# Patient Record
Sex: Female | Born: 1950
Health system: Southern US, Community
[De-identification: ages and names within clinical notes are randomized; demographics above are authoritative.]

## PROBLEM LIST (undated history)

## (undated) DIAGNOSIS — F329 Major depressive disorder, single episode, unspecified: Secondary | ICD-10-CM

## (undated) DIAGNOSIS — J449 Chronic obstructive pulmonary disease, unspecified: Secondary | ICD-10-CM

## (undated) DIAGNOSIS — IMO0002 Reserved for concepts with insufficient information to code with codable children: Secondary | ICD-10-CM

## (undated) DIAGNOSIS — F32A Depression, unspecified: Secondary | ICD-10-CM

## (undated) DIAGNOSIS — E079 Disorder of thyroid, unspecified: Secondary | ICD-10-CM

## (undated) DIAGNOSIS — E785 Hyperlipidemia, unspecified: Secondary | ICD-10-CM

## (undated) DIAGNOSIS — G8929 Other chronic pain: Secondary | ICD-10-CM

## (undated) DIAGNOSIS — I10 Essential (primary) hypertension: Secondary | ICD-10-CM

## (undated) DIAGNOSIS — D649 Anemia, unspecified: Secondary | ICD-10-CM

## (undated) DIAGNOSIS — R339 Retention of urine, unspecified: Secondary | ICD-10-CM

## (undated) DIAGNOSIS — I639 Cerebral infarction, unspecified: Secondary | ICD-10-CM

## (undated) DIAGNOSIS — E119 Type 2 diabetes mellitus without complications: Secondary | ICD-10-CM

## (undated) DIAGNOSIS — W19XXXA Unspecified fall, initial encounter: Secondary | ICD-10-CM

## (undated) DIAGNOSIS — I2510 Atherosclerotic heart disease of native coronary artery without angina pectoris: Secondary | ICD-10-CM

## (undated) HISTORY — DX: Hyperlipidemia, unspecified: E78.5

## (undated) HISTORY — PX: CHOLECYSTECTOMY: SHX55

## (undated) HISTORY — DX: Anemia, unspecified: D64.9

## (undated) HISTORY — DX: Other chronic pain: G89.29

## (undated) HISTORY — DX: Retention of urine, unspecified: R33.9

## (undated) HISTORY — DX: Essential (primary) hypertension: I10

## (undated) HISTORY — DX: Depression, unspecified: F32.A

## (undated) HISTORY — DX: Chronic obstructive pulmonary disease, unspecified: J44.9

## (undated) HISTORY — PX: JOINT REPLACEMENT: SHX530

## (undated) HISTORY — DX: Disorder of thyroid, unspecified: E07.9

## (undated) HISTORY — DX: Type 2 diabetes mellitus without complications: E11.9

## (undated) HISTORY — DX: Unspecified fall, initial encounter: W19.XXXA

## (undated) HISTORY — DX: Cerebral infarction, unspecified: I63.9

## (undated) HISTORY — DX: Reserved for concepts with insufficient information to code with codable children: IMO0002

## (undated) HISTORY — PX: ABDOMINAL HYSTERECTOMY: SHX81

## (undated) HISTORY — PX: TONSILLECTOMY: SUR1361

## (undated) HISTORY — DX: Major depressive disorder, single episode, unspecified: F32.9

## (undated) HISTORY — PX: APPENDECTOMY: SHX54

## (undated) HISTORY — DX: Atherosclerotic heart disease of native coronary artery without angina pectoris: I25.10

---

## 2011-08-22 DIAGNOSIS — E119 Type 2 diabetes mellitus without complications: Secondary | ICD-10-CM | POA: Diagnosis not present

## 2011-08-22 DIAGNOSIS — I69959 Hemiplegia and hemiparesis following unspecified cerebrovascular disease affecting unspecified side: Secondary | ICD-10-CM | POA: Diagnosis not present

## 2011-08-22 DIAGNOSIS — J441 Chronic obstructive pulmonary disease with (acute) exacerbation: Secondary | ICD-10-CM | POA: Diagnosis not present

## 2011-08-22 DIAGNOSIS — G589 Mononeuropathy, unspecified: Secondary | ICD-10-CM | POA: Diagnosis not present

## 2011-08-22 DIAGNOSIS — R569 Unspecified convulsions: Secondary | ICD-10-CM | POA: Diagnosis not present

## 2011-08-23 DIAGNOSIS — J441 Chronic obstructive pulmonary disease with (acute) exacerbation: Secondary | ICD-10-CM | POA: Diagnosis not present

## 2011-08-23 DIAGNOSIS — G589 Mononeuropathy, unspecified: Secondary | ICD-10-CM | POA: Diagnosis not present

## 2011-08-23 DIAGNOSIS — I69959 Hemiplegia and hemiparesis following unspecified cerebrovascular disease affecting unspecified side: Secondary | ICD-10-CM | POA: Diagnosis not present

## 2011-08-23 DIAGNOSIS — R569 Unspecified convulsions: Secondary | ICD-10-CM | POA: Diagnosis not present

## 2011-08-23 DIAGNOSIS — E119 Type 2 diabetes mellitus without complications: Secondary | ICD-10-CM | POA: Diagnosis not present

## 2011-08-24 DIAGNOSIS — R569 Unspecified convulsions: Secondary | ICD-10-CM | POA: Diagnosis not present

## 2011-08-24 DIAGNOSIS — J441 Chronic obstructive pulmonary disease with (acute) exacerbation: Secondary | ICD-10-CM | POA: Diagnosis not present

## 2011-08-24 DIAGNOSIS — E119 Type 2 diabetes mellitus without complications: Secondary | ICD-10-CM | POA: Diagnosis not present

## 2011-08-24 DIAGNOSIS — G589 Mononeuropathy, unspecified: Secondary | ICD-10-CM | POA: Diagnosis not present

## 2011-08-24 DIAGNOSIS — I69959 Hemiplegia and hemiparesis following unspecified cerebrovascular disease affecting unspecified side: Secondary | ICD-10-CM | POA: Diagnosis not present

## 2011-08-28 DIAGNOSIS — G589 Mononeuropathy, unspecified: Secondary | ICD-10-CM | POA: Diagnosis not present

## 2011-08-28 DIAGNOSIS — J441 Chronic obstructive pulmonary disease with (acute) exacerbation: Secondary | ICD-10-CM | POA: Diagnosis not present

## 2011-08-28 DIAGNOSIS — E119 Type 2 diabetes mellitus without complications: Secondary | ICD-10-CM | POA: Diagnosis not present

## 2011-08-28 DIAGNOSIS — I69959 Hemiplegia and hemiparesis following unspecified cerebrovascular disease affecting unspecified side: Secondary | ICD-10-CM | POA: Diagnosis not present

## 2011-08-28 DIAGNOSIS — R569 Unspecified convulsions: Secondary | ICD-10-CM | POA: Diagnosis not present

## 2011-08-29 DIAGNOSIS — E119 Type 2 diabetes mellitus without complications: Secondary | ICD-10-CM | POA: Diagnosis not present

## 2011-08-29 DIAGNOSIS — I69959 Hemiplegia and hemiparesis following unspecified cerebrovascular disease affecting unspecified side: Secondary | ICD-10-CM | POA: Diagnosis not present

## 2011-08-29 DIAGNOSIS — J441 Chronic obstructive pulmonary disease with (acute) exacerbation: Secondary | ICD-10-CM | POA: Diagnosis not present

## 2011-08-29 DIAGNOSIS — R569 Unspecified convulsions: Secondary | ICD-10-CM | POA: Diagnosis not present

## 2011-08-29 DIAGNOSIS — G589 Mononeuropathy, unspecified: Secondary | ICD-10-CM | POA: Diagnosis not present

## 2011-08-30 DIAGNOSIS — J441 Chronic obstructive pulmonary disease with (acute) exacerbation: Secondary | ICD-10-CM | POA: Diagnosis not present

## 2011-08-30 DIAGNOSIS — R569 Unspecified convulsions: Secondary | ICD-10-CM | POA: Diagnosis not present

## 2011-08-30 DIAGNOSIS — G589 Mononeuropathy, unspecified: Secondary | ICD-10-CM | POA: Diagnosis not present

## 2011-08-30 DIAGNOSIS — E119 Type 2 diabetes mellitus without complications: Secondary | ICD-10-CM | POA: Diagnosis not present

## 2011-08-30 DIAGNOSIS — I69959 Hemiplegia and hemiparesis following unspecified cerebrovascular disease affecting unspecified side: Secondary | ICD-10-CM | POA: Diagnosis not present

## 2011-09-01 DIAGNOSIS — I69959 Hemiplegia and hemiparesis following unspecified cerebrovascular disease affecting unspecified side: Secondary | ICD-10-CM | POA: Diagnosis not present

## 2011-09-01 DIAGNOSIS — E119 Type 2 diabetes mellitus without complications: Secondary | ICD-10-CM | POA: Diagnosis not present

## 2011-09-01 DIAGNOSIS — G589 Mononeuropathy, unspecified: Secondary | ICD-10-CM | POA: Diagnosis not present

## 2011-09-01 DIAGNOSIS — R569 Unspecified convulsions: Secondary | ICD-10-CM | POA: Diagnosis not present

## 2011-09-01 DIAGNOSIS — J441 Chronic obstructive pulmonary disease with (acute) exacerbation: Secondary | ICD-10-CM | POA: Diagnosis not present

## 2011-09-06 DIAGNOSIS — J441 Chronic obstructive pulmonary disease with (acute) exacerbation: Secondary | ICD-10-CM | POA: Diagnosis not present

## 2011-09-06 DIAGNOSIS — I69959 Hemiplegia and hemiparesis following unspecified cerebrovascular disease affecting unspecified side: Secondary | ICD-10-CM | POA: Diagnosis not present

## 2011-09-06 DIAGNOSIS — R569 Unspecified convulsions: Secondary | ICD-10-CM | POA: Diagnosis not present

## 2011-09-06 DIAGNOSIS — G589 Mononeuropathy, unspecified: Secondary | ICD-10-CM | POA: Diagnosis not present

## 2011-09-06 DIAGNOSIS — E119 Type 2 diabetes mellitus without complications: Secondary | ICD-10-CM | POA: Diagnosis not present

## 2011-09-07 DIAGNOSIS — E119 Type 2 diabetes mellitus without complications: Secondary | ICD-10-CM | POA: Diagnosis not present

## 2011-09-07 DIAGNOSIS — R569 Unspecified convulsions: Secondary | ICD-10-CM | POA: Diagnosis not present

## 2011-09-07 DIAGNOSIS — I69959 Hemiplegia and hemiparesis following unspecified cerebrovascular disease affecting unspecified side: Secondary | ICD-10-CM | POA: Diagnosis not present

## 2011-09-07 DIAGNOSIS — J441 Chronic obstructive pulmonary disease with (acute) exacerbation: Secondary | ICD-10-CM | POA: Diagnosis not present

## 2011-09-07 DIAGNOSIS — G589 Mononeuropathy, unspecified: Secondary | ICD-10-CM | POA: Diagnosis not present

## 2011-09-08 DIAGNOSIS — J441 Chronic obstructive pulmonary disease with (acute) exacerbation: Secondary | ICD-10-CM | POA: Diagnosis not present

## 2011-09-08 DIAGNOSIS — G589 Mononeuropathy, unspecified: Secondary | ICD-10-CM | POA: Diagnosis not present

## 2011-09-08 DIAGNOSIS — R569 Unspecified convulsions: Secondary | ICD-10-CM | POA: Diagnosis not present

## 2011-09-08 DIAGNOSIS — I69959 Hemiplegia and hemiparesis following unspecified cerebrovascular disease affecting unspecified side: Secondary | ICD-10-CM | POA: Diagnosis not present

## 2011-09-08 DIAGNOSIS — E119 Type 2 diabetes mellitus without complications: Secondary | ICD-10-CM | POA: Diagnosis not present

## 2011-09-12 DIAGNOSIS — R569 Unspecified convulsions: Secondary | ICD-10-CM | POA: Diagnosis not present

## 2011-09-12 DIAGNOSIS — G589 Mononeuropathy, unspecified: Secondary | ICD-10-CM | POA: Diagnosis not present

## 2011-09-12 DIAGNOSIS — J441 Chronic obstructive pulmonary disease with (acute) exacerbation: Secondary | ICD-10-CM | POA: Diagnosis not present

## 2011-09-12 DIAGNOSIS — E119 Type 2 diabetes mellitus without complications: Secondary | ICD-10-CM | POA: Diagnosis not present

## 2011-09-12 DIAGNOSIS — I69959 Hemiplegia and hemiparesis following unspecified cerebrovascular disease affecting unspecified side: Secondary | ICD-10-CM | POA: Diagnosis not present

## 2011-09-13 DIAGNOSIS — J441 Chronic obstructive pulmonary disease with (acute) exacerbation: Secondary | ICD-10-CM | POA: Diagnosis not present

## 2011-09-13 DIAGNOSIS — G589 Mononeuropathy, unspecified: Secondary | ICD-10-CM | POA: Diagnosis not present

## 2011-09-13 DIAGNOSIS — I69959 Hemiplegia and hemiparesis following unspecified cerebrovascular disease affecting unspecified side: Secondary | ICD-10-CM | POA: Diagnosis not present

## 2011-09-13 DIAGNOSIS — R569 Unspecified convulsions: Secondary | ICD-10-CM | POA: Diagnosis not present

## 2011-09-13 DIAGNOSIS — E119 Type 2 diabetes mellitus without complications: Secondary | ICD-10-CM | POA: Diagnosis not present

## 2011-09-15 DIAGNOSIS — J441 Chronic obstructive pulmonary disease with (acute) exacerbation: Secondary | ICD-10-CM | POA: Diagnosis not present

## 2011-09-15 DIAGNOSIS — G589 Mononeuropathy, unspecified: Secondary | ICD-10-CM | POA: Diagnosis not present

## 2011-09-15 DIAGNOSIS — R569 Unspecified convulsions: Secondary | ICD-10-CM | POA: Diagnosis not present

## 2011-09-15 DIAGNOSIS — I69959 Hemiplegia and hemiparesis following unspecified cerebrovascular disease affecting unspecified side: Secondary | ICD-10-CM | POA: Diagnosis not present

## 2011-09-15 DIAGNOSIS — E119 Type 2 diabetes mellitus without complications: Secondary | ICD-10-CM | POA: Diagnosis not present

## 2011-09-18 DIAGNOSIS — G589 Mononeuropathy, unspecified: Secondary | ICD-10-CM | POA: Diagnosis not present

## 2011-09-18 DIAGNOSIS — I69959 Hemiplegia and hemiparesis following unspecified cerebrovascular disease affecting unspecified side: Secondary | ICD-10-CM | POA: Diagnosis not present

## 2011-09-18 DIAGNOSIS — E119 Type 2 diabetes mellitus without complications: Secondary | ICD-10-CM | POA: Diagnosis not present

## 2011-09-18 DIAGNOSIS — R569 Unspecified convulsions: Secondary | ICD-10-CM | POA: Diagnosis not present

## 2011-09-18 DIAGNOSIS — J441 Chronic obstructive pulmonary disease with (acute) exacerbation: Secondary | ICD-10-CM | POA: Diagnosis not present

## 2011-09-19 DIAGNOSIS — J441 Chronic obstructive pulmonary disease with (acute) exacerbation: Secondary | ICD-10-CM | POA: Diagnosis not present

## 2011-09-19 DIAGNOSIS — G589 Mononeuropathy, unspecified: Secondary | ICD-10-CM | POA: Diagnosis not present

## 2011-09-19 DIAGNOSIS — E119 Type 2 diabetes mellitus without complications: Secondary | ICD-10-CM | POA: Diagnosis not present

## 2011-09-19 DIAGNOSIS — I69959 Hemiplegia and hemiparesis following unspecified cerebrovascular disease affecting unspecified side: Secondary | ICD-10-CM | POA: Diagnosis not present

## 2011-09-21 DIAGNOSIS — G589 Mononeuropathy, unspecified: Secondary | ICD-10-CM | POA: Diagnosis not present

## 2011-09-21 DIAGNOSIS — J441 Chronic obstructive pulmonary disease with (acute) exacerbation: Secondary | ICD-10-CM | POA: Diagnosis not present

## 2011-09-21 DIAGNOSIS — R569 Unspecified convulsions: Secondary | ICD-10-CM | POA: Diagnosis not present

## 2011-09-21 DIAGNOSIS — E039 Hypothyroidism, unspecified: Secondary | ICD-10-CM | POA: Diagnosis not present

## 2011-09-21 DIAGNOSIS — R300 Dysuria: Secondary | ICD-10-CM | POA: Diagnosis not present

## 2011-09-21 DIAGNOSIS — R0602 Shortness of breath: Secondary | ICD-10-CM | POA: Diagnosis not present

## 2011-09-21 DIAGNOSIS — I69959 Hemiplegia and hemiparesis following unspecified cerebrovascular disease affecting unspecified side: Secondary | ICD-10-CM | POA: Diagnosis not present

## 2011-09-21 DIAGNOSIS — E559 Vitamin D deficiency, unspecified: Secondary | ICD-10-CM | POA: Diagnosis not present

## 2011-09-21 DIAGNOSIS — M109 Gout, unspecified: Secondary | ICD-10-CM | POA: Diagnosis not present

## 2011-09-21 DIAGNOSIS — E119 Type 2 diabetes mellitus without complications: Secondary | ICD-10-CM | POA: Diagnosis not present

## 2011-09-21 DIAGNOSIS — D500 Iron deficiency anemia secondary to blood loss (chronic): Secondary | ICD-10-CM | POA: Diagnosis not present

## 2011-09-21 DIAGNOSIS — M2550 Pain in unspecified joint: Secondary | ICD-10-CM | POA: Diagnosis not present

## 2011-09-21 DIAGNOSIS — E1149 Type 2 diabetes mellitus with other diabetic neurological complication: Secondary | ICD-10-CM | POA: Diagnosis not present

## 2011-10-02 DIAGNOSIS — G589 Mononeuropathy, unspecified: Secondary | ICD-10-CM | POA: Diagnosis not present

## 2011-10-02 DIAGNOSIS — R569 Unspecified convulsions: Secondary | ICD-10-CM | POA: Diagnosis not present

## 2011-10-02 DIAGNOSIS — J441 Chronic obstructive pulmonary disease with (acute) exacerbation: Secondary | ICD-10-CM | POA: Diagnosis not present

## 2011-10-02 DIAGNOSIS — I69959 Hemiplegia and hemiparesis following unspecified cerebrovascular disease affecting unspecified side: Secondary | ICD-10-CM | POA: Diagnosis not present

## 2011-10-02 DIAGNOSIS — E119 Type 2 diabetes mellitus without complications: Secondary | ICD-10-CM | POA: Diagnosis not present

## 2011-10-05 DIAGNOSIS — I69959 Hemiplegia and hemiparesis following unspecified cerebrovascular disease affecting unspecified side: Secondary | ICD-10-CM | POA: Diagnosis not present

## 2011-10-05 DIAGNOSIS — E119 Type 2 diabetes mellitus without complications: Secondary | ICD-10-CM | POA: Diagnosis not present

## 2011-10-05 DIAGNOSIS — R569 Unspecified convulsions: Secondary | ICD-10-CM | POA: Diagnosis not present

## 2011-10-05 DIAGNOSIS — J441 Chronic obstructive pulmonary disease with (acute) exacerbation: Secondary | ICD-10-CM | POA: Diagnosis not present

## 2011-10-05 DIAGNOSIS — G589 Mononeuropathy, unspecified: Secondary | ICD-10-CM | POA: Diagnosis not present

## 2011-10-09 DIAGNOSIS — J441 Chronic obstructive pulmonary disease with (acute) exacerbation: Secondary | ICD-10-CM | POA: Diagnosis not present

## 2011-10-09 DIAGNOSIS — G589 Mononeuropathy, unspecified: Secondary | ICD-10-CM | POA: Diagnosis not present

## 2011-10-09 DIAGNOSIS — R569 Unspecified convulsions: Secondary | ICD-10-CM | POA: Diagnosis not present

## 2011-10-09 DIAGNOSIS — I69959 Hemiplegia and hemiparesis following unspecified cerebrovascular disease affecting unspecified side: Secondary | ICD-10-CM | POA: Diagnosis not present

## 2011-10-09 DIAGNOSIS — E119 Type 2 diabetes mellitus without complications: Secondary | ICD-10-CM | POA: Diagnosis not present

## 2011-10-17 DIAGNOSIS — J441 Chronic obstructive pulmonary disease with (acute) exacerbation: Secondary | ICD-10-CM | POA: Diagnosis not present

## 2011-10-17 DIAGNOSIS — R569 Unspecified convulsions: Secondary | ICD-10-CM | POA: Diagnosis not present

## 2011-10-17 DIAGNOSIS — E119 Type 2 diabetes mellitus without complications: Secondary | ICD-10-CM | POA: Diagnosis not present

## 2011-10-17 DIAGNOSIS — I69959 Hemiplegia and hemiparesis following unspecified cerebrovascular disease affecting unspecified side: Secondary | ICD-10-CM | POA: Diagnosis not present

## 2011-10-17 DIAGNOSIS — G589 Mononeuropathy, unspecified: Secondary | ICD-10-CM | POA: Diagnosis not present

## 2011-10-19 DIAGNOSIS — R05 Cough: Secondary | ICD-10-CM | POA: Diagnosis not present

## 2011-10-21 DIAGNOSIS — J441 Chronic obstructive pulmonary disease with (acute) exacerbation: Secondary | ICD-10-CM | POA: Diagnosis not present

## 2011-10-21 DIAGNOSIS — G589 Mononeuropathy, unspecified: Secondary | ICD-10-CM | POA: Diagnosis not present

## 2011-10-21 DIAGNOSIS — E119 Type 2 diabetes mellitus without complications: Secondary | ICD-10-CM | POA: Diagnosis not present

## 2011-10-21 DIAGNOSIS — R569 Unspecified convulsions: Secondary | ICD-10-CM | POA: Diagnosis not present

## 2011-10-21 DIAGNOSIS — I69959 Hemiplegia and hemiparesis following unspecified cerebrovascular disease affecting unspecified side: Secondary | ICD-10-CM | POA: Diagnosis not present

## 2011-10-25 DIAGNOSIS — G589 Mononeuropathy, unspecified: Secondary | ICD-10-CM | POA: Diagnosis not present

## 2011-10-25 DIAGNOSIS — I69959 Hemiplegia and hemiparesis following unspecified cerebrovascular disease affecting unspecified side: Secondary | ICD-10-CM | POA: Diagnosis not present

## 2011-10-25 DIAGNOSIS — R569 Unspecified convulsions: Secondary | ICD-10-CM | POA: Diagnosis not present

## 2011-10-25 DIAGNOSIS — E119 Type 2 diabetes mellitus without complications: Secondary | ICD-10-CM | POA: Diagnosis not present

## 2011-10-25 DIAGNOSIS — J441 Chronic obstructive pulmonary disease with (acute) exacerbation: Secondary | ICD-10-CM | POA: Diagnosis not present

## 2011-10-31 DIAGNOSIS — I69959 Hemiplegia and hemiparesis following unspecified cerebrovascular disease affecting unspecified side: Secondary | ICD-10-CM | POA: Diagnosis not present

## 2011-10-31 DIAGNOSIS — J441 Chronic obstructive pulmonary disease with (acute) exacerbation: Secondary | ICD-10-CM | POA: Diagnosis not present

## 2011-10-31 DIAGNOSIS — G589 Mononeuropathy, unspecified: Secondary | ICD-10-CM | POA: Diagnosis not present

## 2011-10-31 DIAGNOSIS — E119 Type 2 diabetes mellitus without complications: Secondary | ICD-10-CM | POA: Diagnosis not present

## 2011-10-31 DIAGNOSIS — R569 Unspecified convulsions: Secondary | ICD-10-CM | POA: Diagnosis not present

## 2011-11-08 DIAGNOSIS — R569 Unspecified convulsions: Secondary | ICD-10-CM | POA: Diagnosis not present

## 2011-11-08 DIAGNOSIS — E119 Type 2 diabetes mellitus without complications: Secondary | ICD-10-CM | POA: Diagnosis not present

## 2011-11-08 DIAGNOSIS — I69959 Hemiplegia and hemiparesis following unspecified cerebrovascular disease affecting unspecified side: Secondary | ICD-10-CM | POA: Diagnosis not present

## 2011-11-08 DIAGNOSIS — G589 Mononeuropathy, unspecified: Secondary | ICD-10-CM | POA: Diagnosis not present

## 2011-11-08 DIAGNOSIS — J441 Chronic obstructive pulmonary disease with (acute) exacerbation: Secondary | ICD-10-CM | POA: Diagnosis not present

## 2011-11-15 DIAGNOSIS — G589 Mononeuropathy, unspecified: Secondary | ICD-10-CM | POA: Diagnosis not present

## 2011-11-15 DIAGNOSIS — J441 Chronic obstructive pulmonary disease with (acute) exacerbation: Secondary | ICD-10-CM | POA: Diagnosis not present

## 2011-11-15 DIAGNOSIS — I69959 Hemiplegia and hemiparesis following unspecified cerebrovascular disease affecting unspecified side: Secondary | ICD-10-CM | POA: Diagnosis not present

## 2011-11-15 DIAGNOSIS — E119 Type 2 diabetes mellitus without complications: Secondary | ICD-10-CM | POA: Diagnosis not present

## 2011-11-15 DIAGNOSIS — R569 Unspecified convulsions: Secondary | ICD-10-CM | POA: Diagnosis not present

## 2011-11-22 DIAGNOSIS — I69959 Hemiplegia and hemiparesis following unspecified cerebrovascular disease affecting unspecified side: Secondary | ICD-10-CM | POA: Diagnosis not present

## 2011-11-22 DIAGNOSIS — E119 Type 2 diabetes mellitus without complications: Secondary | ICD-10-CM | POA: Diagnosis not present

## 2011-11-22 DIAGNOSIS — J441 Chronic obstructive pulmonary disease with (acute) exacerbation: Secondary | ICD-10-CM | POA: Diagnosis not present

## 2011-11-22 DIAGNOSIS — G589 Mononeuropathy, unspecified: Secondary | ICD-10-CM | POA: Diagnosis not present

## 2011-11-22 DIAGNOSIS — R569 Unspecified convulsions: Secondary | ICD-10-CM | POA: Diagnosis not present

## 2011-11-27 DIAGNOSIS — G43909 Migraine, unspecified, not intractable, without status migrainosus: Secondary | ICD-10-CM | POA: Diagnosis not present

## 2011-11-27 DIAGNOSIS — J449 Chronic obstructive pulmonary disease, unspecified: Secondary | ICD-10-CM | POA: Diagnosis not present

## 2011-11-27 DIAGNOSIS — J40 Bronchitis, not specified as acute or chronic: Secondary | ICD-10-CM | POA: Diagnosis not present

## 2011-11-27 DIAGNOSIS — M25519 Pain in unspecified shoulder: Secondary | ICD-10-CM | POA: Diagnosis not present

## 2011-11-27 DIAGNOSIS — E1149 Type 2 diabetes mellitus with other diabetic neurological complication: Secondary | ICD-10-CM | POA: Diagnosis not present

## 2011-11-27 DIAGNOSIS — M549 Dorsalgia, unspecified: Secondary | ICD-10-CM | POA: Diagnosis not present

## 2011-11-30 DIAGNOSIS — R569 Unspecified convulsions: Secondary | ICD-10-CM | POA: Diagnosis not present

## 2011-11-30 DIAGNOSIS — E119 Type 2 diabetes mellitus without complications: Secondary | ICD-10-CM | POA: Diagnosis not present

## 2011-11-30 DIAGNOSIS — G589 Mononeuropathy, unspecified: Secondary | ICD-10-CM | POA: Diagnosis not present

## 2011-11-30 DIAGNOSIS — I69959 Hemiplegia and hemiparesis following unspecified cerebrovascular disease affecting unspecified side: Secondary | ICD-10-CM | POA: Diagnosis not present

## 2011-11-30 DIAGNOSIS — J441 Chronic obstructive pulmonary disease with (acute) exacerbation: Secondary | ICD-10-CM | POA: Diagnosis not present

## 2011-12-05 DIAGNOSIS — J441 Chronic obstructive pulmonary disease with (acute) exacerbation: Secondary | ICD-10-CM | POA: Diagnosis not present

## 2011-12-05 DIAGNOSIS — R569 Unspecified convulsions: Secondary | ICD-10-CM | POA: Diagnosis not present

## 2011-12-05 DIAGNOSIS — G589 Mononeuropathy, unspecified: Secondary | ICD-10-CM | POA: Diagnosis not present

## 2011-12-05 DIAGNOSIS — E119 Type 2 diabetes mellitus without complications: Secondary | ICD-10-CM | POA: Diagnosis not present

## 2011-12-05 DIAGNOSIS — I69959 Hemiplegia and hemiparesis following unspecified cerebrovascular disease affecting unspecified side: Secondary | ICD-10-CM | POA: Diagnosis not present

## 2011-12-12 DIAGNOSIS — J441 Chronic obstructive pulmonary disease with (acute) exacerbation: Secondary | ICD-10-CM | POA: Diagnosis not present

## 2011-12-12 DIAGNOSIS — G589 Mononeuropathy, unspecified: Secondary | ICD-10-CM | POA: Diagnosis not present

## 2011-12-12 DIAGNOSIS — R569 Unspecified convulsions: Secondary | ICD-10-CM | POA: Diagnosis not present

## 2011-12-12 DIAGNOSIS — I69959 Hemiplegia and hemiparesis following unspecified cerebrovascular disease affecting unspecified side: Secondary | ICD-10-CM | POA: Diagnosis not present

## 2011-12-12 DIAGNOSIS — E119 Type 2 diabetes mellitus without complications: Secondary | ICD-10-CM | POA: Diagnosis not present

## 2011-12-18 DIAGNOSIS — R569 Unspecified convulsions: Secondary | ICD-10-CM | POA: Diagnosis not present

## 2011-12-18 DIAGNOSIS — E119 Type 2 diabetes mellitus without complications: Secondary | ICD-10-CM | POA: Diagnosis not present

## 2011-12-18 DIAGNOSIS — I69959 Hemiplegia and hemiparesis following unspecified cerebrovascular disease affecting unspecified side: Secondary | ICD-10-CM | POA: Diagnosis not present

## 2011-12-18 DIAGNOSIS — J441 Chronic obstructive pulmonary disease with (acute) exacerbation: Secondary | ICD-10-CM | POA: Diagnosis not present

## 2011-12-18 DIAGNOSIS — G589 Mononeuropathy, unspecified: Secondary | ICD-10-CM | POA: Diagnosis not present

## 2011-12-20 DIAGNOSIS — I69959 Hemiplegia and hemiparesis following unspecified cerebrovascular disease affecting unspecified side: Secondary | ICD-10-CM | POA: Diagnosis not present

## 2011-12-20 DIAGNOSIS — E119 Type 2 diabetes mellitus without complications: Secondary | ICD-10-CM | POA: Diagnosis not present

## 2011-12-20 DIAGNOSIS — Z8701 Personal history of pneumonia (recurrent): Secondary | ICD-10-CM | POA: Diagnosis not present

## 2011-12-20 DIAGNOSIS — G589 Mononeuropathy, unspecified: Secondary | ICD-10-CM | POA: Diagnosis not present

## 2011-12-20 DIAGNOSIS — J441 Chronic obstructive pulmonary disease with (acute) exacerbation: Secondary | ICD-10-CM | POA: Diagnosis not present

## 2011-12-26 DIAGNOSIS — Z8701 Personal history of pneumonia (recurrent): Secondary | ICD-10-CM | POA: Diagnosis not present

## 2011-12-26 DIAGNOSIS — J441 Chronic obstructive pulmonary disease with (acute) exacerbation: Secondary | ICD-10-CM | POA: Diagnosis not present

## 2011-12-26 DIAGNOSIS — E119 Type 2 diabetes mellitus without complications: Secondary | ICD-10-CM | POA: Diagnosis not present

## 2011-12-26 DIAGNOSIS — G589 Mononeuropathy, unspecified: Secondary | ICD-10-CM | POA: Diagnosis not present

## 2011-12-26 DIAGNOSIS — I69959 Hemiplegia and hemiparesis following unspecified cerebrovascular disease affecting unspecified side: Secondary | ICD-10-CM | POA: Diagnosis not present

## 2012-01-02 DIAGNOSIS — E119 Type 2 diabetes mellitus without complications: Secondary | ICD-10-CM | POA: Diagnosis not present

## 2012-01-02 DIAGNOSIS — G589 Mononeuropathy, unspecified: Secondary | ICD-10-CM | POA: Diagnosis not present

## 2012-01-02 DIAGNOSIS — Z8701 Personal history of pneumonia (recurrent): Secondary | ICD-10-CM | POA: Diagnosis not present

## 2012-01-02 DIAGNOSIS — I69959 Hemiplegia and hemiparesis following unspecified cerebrovascular disease affecting unspecified side: Secondary | ICD-10-CM | POA: Diagnosis not present

## 2012-01-02 DIAGNOSIS — J441 Chronic obstructive pulmonary disease with (acute) exacerbation: Secondary | ICD-10-CM | POA: Diagnosis not present

## 2012-01-11 DIAGNOSIS — J441 Chronic obstructive pulmonary disease with (acute) exacerbation: Secondary | ICD-10-CM | POA: Diagnosis not present

## 2012-01-11 DIAGNOSIS — I69959 Hemiplegia and hemiparesis following unspecified cerebrovascular disease affecting unspecified side: Secondary | ICD-10-CM | POA: Diagnosis not present

## 2012-01-11 DIAGNOSIS — E119 Type 2 diabetes mellitus without complications: Secondary | ICD-10-CM | POA: Diagnosis not present

## 2012-01-11 DIAGNOSIS — Z8701 Personal history of pneumonia (recurrent): Secondary | ICD-10-CM | POA: Diagnosis not present

## 2012-01-11 DIAGNOSIS — G589 Mononeuropathy, unspecified: Secondary | ICD-10-CM | POA: Diagnosis not present

## 2012-01-16 DIAGNOSIS — I69959 Hemiplegia and hemiparesis following unspecified cerebrovascular disease affecting unspecified side: Secondary | ICD-10-CM | POA: Diagnosis not present

## 2012-01-16 DIAGNOSIS — J441 Chronic obstructive pulmonary disease with (acute) exacerbation: Secondary | ICD-10-CM | POA: Diagnosis not present

## 2012-01-16 DIAGNOSIS — E119 Type 2 diabetes mellitus without complications: Secondary | ICD-10-CM | POA: Diagnosis not present

## 2012-01-16 DIAGNOSIS — Z8701 Personal history of pneumonia (recurrent): Secondary | ICD-10-CM | POA: Diagnosis not present

## 2012-01-16 DIAGNOSIS — G589 Mononeuropathy, unspecified: Secondary | ICD-10-CM | POA: Diagnosis not present

## 2012-01-23 DIAGNOSIS — I69959 Hemiplegia and hemiparesis following unspecified cerebrovascular disease affecting unspecified side: Secondary | ICD-10-CM | POA: Diagnosis not present

## 2012-01-23 DIAGNOSIS — J441 Chronic obstructive pulmonary disease with (acute) exacerbation: Secondary | ICD-10-CM | POA: Diagnosis not present

## 2012-01-23 DIAGNOSIS — E119 Type 2 diabetes mellitus without complications: Secondary | ICD-10-CM | POA: Diagnosis not present

## 2012-01-23 DIAGNOSIS — G589 Mononeuropathy, unspecified: Secondary | ICD-10-CM | POA: Diagnosis not present

## 2012-01-23 DIAGNOSIS — Z8701 Personal history of pneumonia (recurrent): Secondary | ICD-10-CM | POA: Diagnosis not present

## 2012-02-01 DIAGNOSIS — G589 Mononeuropathy, unspecified: Secondary | ICD-10-CM | POA: Diagnosis not present

## 2012-02-01 DIAGNOSIS — Z8701 Personal history of pneumonia (recurrent): Secondary | ICD-10-CM | POA: Diagnosis not present

## 2012-02-01 DIAGNOSIS — I69959 Hemiplegia and hemiparesis following unspecified cerebrovascular disease affecting unspecified side: Secondary | ICD-10-CM | POA: Diagnosis not present

## 2012-02-01 DIAGNOSIS — E119 Type 2 diabetes mellitus without complications: Secondary | ICD-10-CM | POA: Diagnosis not present

## 2012-02-01 DIAGNOSIS — J441 Chronic obstructive pulmonary disease with (acute) exacerbation: Secondary | ICD-10-CM | POA: Diagnosis not present

## 2012-02-06 DIAGNOSIS — E119 Type 2 diabetes mellitus without complications: Secondary | ICD-10-CM | POA: Diagnosis not present

## 2012-02-06 DIAGNOSIS — J441 Chronic obstructive pulmonary disease with (acute) exacerbation: Secondary | ICD-10-CM | POA: Diagnosis not present

## 2012-02-06 DIAGNOSIS — G589 Mononeuropathy, unspecified: Secondary | ICD-10-CM | POA: Diagnosis not present

## 2012-02-06 DIAGNOSIS — Z8701 Personal history of pneumonia (recurrent): Secondary | ICD-10-CM | POA: Diagnosis not present

## 2012-02-06 DIAGNOSIS — I69959 Hemiplegia and hemiparesis following unspecified cerebrovascular disease affecting unspecified side: Secondary | ICD-10-CM | POA: Diagnosis not present

## 2012-02-16 DIAGNOSIS — E119 Type 2 diabetes mellitus without complications: Secondary | ICD-10-CM | POA: Diagnosis not present

## 2012-02-16 DIAGNOSIS — J441 Chronic obstructive pulmonary disease with (acute) exacerbation: Secondary | ICD-10-CM | POA: Diagnosis not present

## 2012-02-16 DIAGNOSIS — I69959 Hemiplegia and hemiparesis following unspecified cerebrovascular disease affecting unspecified side: Secondary | ICD-10-CM | POA: Diagnosis not present

## 2012-02-16 DIAGNOSIS — Z8701 Personal history of pneumonia (recurrent): Secondary | ICD-10-CM | POA: Diagnosis not present

## 2012-02-16 DIAGNOSIS — G589 Mononeuropathy, unspecified: Secondary | ICD-10-CM | POA: Diagnosis not present

## 2012-02-26 DIAGNOSIS — E039 Hypothyroidism, unspecified: Secondary | ICD-10-CM | POA: Diagnosis not present

## 2012-02-26 DIAGNOSIS — L240 Irritant contact dermatitis due to detergents: Secondary | ICD-10-CM | POA: Diagnosis not present

## 2012-02-26 DIAGNOSIS — I10 Essential (primary) hypertension: Secondary | ICD-10-CM | POA: Diagnosis not present

## 2012-02-26 DIAGNOSIS — M2550 Pain in unspecified joint: Secondary | ICD-10-CM | POA: Diagnosis not present

## 2012-02-26 DIAGNOSIS — E559 Vitamin D deficiency, unspecified: Secondary | ICD-10-CM | POA: Diagnosis not present

## 2012-02-26 DIAGNOSIS — IMO0001 Reserved for inherently not codable concepts without codable children: Secondary | ICD-10-CM | POA: Diagnosis not present

## 2012-02-26 DIAGNOSIS — D500 Iron deficiency anemia secondary to blood loss (chronic): Secondary | ICD-10-CM | POA: Diagnosis not present

## 2012-02-26 DIAGNOSIS — E538 Deficiency of other specified B group vitamins: Secondary | ICD-10-CM | POA: Diagnosis not present

## 2012-02-26 DIAGNOSIS — E785 Hyperlipidemia, unspecified: Secondary | ICD-10-CM | POA: Diagnosis not present

## 2012-02-28 DIAGNOSIS — M25519 Pain in unspecified shoulder: Secondary | ICD-10-CM | POA: Diagnosis not present

## 2012-02-28 DIAGNOSIS — M19019 Primary osteoarthritis, unspecified shoulder: Secondary | ICD-10-CM | POA: Diagnosis not present

## 2012-02-28 DIAGNOSIS — M7500 Adhesive capsulitis of unspecified shoulder: Secondary | ICD-10-CM | POA: Diagnosis not present

## 2012-02-29 DIAGNOSIS — I10 Essential (primary) hypertension: Secondary | ICD-10-CM | POA: Diagnosis not present

## 2012-02-29 DIAGNOSIS — J449 Chronic obstructive pulmonary disease, unspecified: Secondary | ICD-10-CM | POA: Diagnosis not present

## 2012-02-29 DIAGNOSIS — E663 Overweight: Secondary | ICD-10-CM | POA: Diagnosis not present

## 2012-02-29 DIAGNOSIS — E119 Type 2 diabetes mellitus without complications: Secondary | ICD-10-CM | POA: Diagnosis not present

## 2012-02-29 DIAGNOSIS — F329 Major depressive disorder, single episode, unspecified: Secondary | ICD-10-CM | POA: Diagnosis not present

## 2012-02-29 DIAGNOSIS — E039 Hypothyroidism, unspecified: Secondary | ICD-10-CM | POA: Diagnosis not present

## 2012-02-29 DIAGNOSIS — E785 Hyperlipidemia, unspecified: Secondary | ICD-10-CM | POA: Diagnosis not present

## 2012-02-29 DIAGNOSIS — F411 Generalized anxiety disorder: Secondary | ICD-10-CM | POA: Diagnosis not present

## 2012-04-02 DIAGNOSIS — M25519 Pain in unspecified shoulder: Secondary | ICD-10-CM | POA: Diagnosis not present

## 2012-04-02 DIAGNOSIS — M19019 Primary osteoarthritis, unspecified shoulder: Secondary | ICD-10-CM | POA: Diagnosis not present

## 2012-04-02 DIAGNOSIS — M7500 Adhesive capsulitis of unspecified shoulder: Secondary | ICD-10-CM | POA: Diagnosis not present

## 2012-04-04 DIAGNOSIS — R918 Other nonspecific abnormal finding of lung field: Secondary | ICD-10-CM | POA: Diagnosis not present

## 2012-04-04 DIAGNOSIS — E11329 Type 2 diabetes mellitus with mild nonproliferative diabetic retinopathy without macular edema: Secondary | ICD-10-CM | POA: Diagnosis not present

## 2012-04-04 DIAGNOSIS — E119 Type 2 diabetes mellitus without complications: Secondary | ICD-10-CM | POA: Diagnosis not present

## 2012-04-18 DIAGNOSIS — E119 Type 2 diabetes mellitus without complications: Secondary | ICD-10-CM | POA: Diagnosis not present

## 2012-04-25 DIAGNOSIS — G4700 Insomnia, unspecified: Secondary | ICD-10-CM | POA: Diagnosis not present

## 2012-04-25 DIAGNOSIS — E785 Hyperlipidemia, unspecified: Secondary | ICD-10-CM | POA: Diagnosis not present

## 2012-04-25 DIAGNOSIS — G9050 Complex regional pain syndrome I, unspecified: Secondary | ICD-10-CM | POA: Diagnosis not present

## 2012-04-25 DIAGNOSIS — Z79899 Other long term (current) drug therapy: Secondary | ICD-10-CM | POA: Diagnosis not present

## 2012-04-25 DIAGNOSIS — F411 Generalized anxiety disorder: Secondary | ICD-10-CM | POA: Diagnosis not present

## 2012-04-25 DIAGNOSIS — M47817 Spondylosis without myelopathy or radiculopathy, lumbosacral region: Secondary | ICD-10-CM | POA: Diagnosis not present

## 2012-04-25 DIAGNOSIS — F329 Major depressive disorder, single episode, unspecified: Secondary | ICD-10-CM | POA: Diagnosis not present

## 2012-06-12 DIAGNOSIS — M159 Polyosteoarthritis, unspecified: Secondary | ICD-10-CM | POA: Diagnosis not present

## 2012-06-12 DIAGNOSIS — M790 Rheumatism, unspecified: Secondary | ICD-10-CM | POA: Diagnosis not present

## 2012-06-12 DIAGNOSIS — Z23 Encounter for immunization: Secondary | ICD-10-CM | POA: Diagnosis not present

## 2012-07-04 DIAGNOSIS — E785 Hyperlipidemia, unspecified: Secondary | ICD-10-CM | POA: Diagnosis not present

## 2012-07-04 DIAGNOSIS — R413 Other amnesia: Secondary | ICD-10-CM | POA: Diagnosis not present

## 2012-07-04 DIAGNOSIS — G9050 Complex regional pain syndrome I, unspecified: Secondary | ICD-10-CM | POA: Diagnosis not present

## 2012-07-04 DIAGNOSIS — G894 Chronic pain syndrome: Secondary | ICD-10-CM | POA: Diagnosis not present

## 2012-07-04 DIAGNOSIS — I10 Essential (primary) hypertension: Secondary | ICD-10-CM | POA: Diagnosis not present

## 2012-07-04 DIAGNOSIS — Z8679 Personal history of other diseases of the circulatory system: Secondary | ICD-10-CM | POA: Diagnosis not present

## 2012-07-04 DIAGNOSIS — H6120 Impacted cerumen, unspecified ear: Secondary | ICD-10-CM | POA: Diagnosis not present

## 2012-07-04 DIAGNOSIS — E1149 Type 2 diabetes mellitus with other diabetic neurological complication: Secondary | ICD-10-CM | POA: Diagnosis not present

## 2012-07-23 DIAGNOSIS — J410 Simple chronic bronchitis: Secondary | ICD-10-CM | POA: Diagnosis not present

## 2012-08-22 DIAGNOSIS — I10 Essential (primary) hypertension: Secondary | ICD-10-CM | POA: Diagnosis not present

## 2012-08-22 DIAGNOSIS — M797 Fibromyalgia: Secondary | ICD-10-CM | POA: Diagnosis not present

## 2012-08-22 DIAGNOSIS — M790 Rheumatism, unspecified: Secondary | ICD-10-CM | POA: Diagnosis not present

## 2012-08-22 DIAGNOSIS — M159 Polyosteoarthritis, unspecified: Secondary | ICD-10-CM | POA: Diagnosis not present

## 2012-09-24 DIAGNOSIS — F411 Generalized anxiety disorder: Secondary | ICD-10-CM | POA: Diagnosis not present

## 2012-09-24 DIAGNOSIS — M159 Polyosteoarthritis, unspecified: Secondary | ICD-10-CM | POA: Diagnosis not present

## 2012-09-24 DIAGNOSIS — E119 Type 2 diabetes mellitus without complications: Secondary | ICD-10-CM | POA: Diagnosis not present

## 2012-11-05 DIAGNOSIS — R0602 Shortness of breath: Secondary | ICD-10-CM | POA: Diagnosis not present

## 2012-11-05 DIAGNOSIS — D500 Iron deficiency anemia secondary to blood loss (chronic): Secondary | ICD-10-CM | POA: Diagnosis not present

## 2012-11-05 DIAGNOSIS — E785 Hyperlipidemia, unspecified: Secondary | ICD-10-CM | POA: Diagnosis not present

## 2012-11-05 DIAGNOSIS — E559 Vitamin D deficiency, unspecified: Secondary | ICD-10-CM | POA: Diagnosis not present

## 2012-11-05 DIAGNOSIS — R946 Abnormal results of thyroid function studies: Secondary | ICD-10-CM | POA: Diagnosis not present

## 2012-11-05 DIAGNOSIS — E538 Deficiency of other specified B group vitamins: Secondary | ICD-10-CM | POA: Diagnosis not present

## 2012-11-05 DIAGNOSIS — I10 Essential (primary) hypertension: Secondary | ICD-10-CM | POA: Diagnosis not present

## 2012-11-05 DIAGNOSIS — M2550 Pain in unspecified joint: Secondary | ICD-10-CM | POA: Diagnosis not present

## 2012-11-13 DIAGNOSIS — E119 Type 2 diabetes mellitus without complications: Secondary | ICD-10-CM | POA: Diagnosis not present

## 2012-11-13 DIAGNOSIS — E890 Postprocedural hypothyroidism: Secondary | ICD-10-CM | POA: Diagnosis not present

## 2012-11-13 DIAGNOSIS — M159 Polyosteoarthritis, unspecified: Secondary | ICD-10-CM | POA: Diagnosis not present

## 2012-11-13 DIAGNOSIS — E785 Hyperlipidemia, unspecified: Secondary | ICD-10-CM | POA: Diagnosis not present

## 2012-11-13 DIAGNOSIS — F411 Generalized anxiety disorder: Secondary | ICD-10-CM | POA: Diagnosis not present

## 2012-11-14 DIAGNOSIS — R4789 Other speech disturbances: Secondary | ICD-10-CM | POA: Diagnosis not present

## 2012-11-14 DIAGNOSIS — I6330 Cerebral infarction due to thrombosis of unspecified cerebral artery: Secondary | ICD-10-CM | POA: Diagnosis not present

## 2012-11-14 DIAGNOSIS — G8190 Hemiplegia, unspecified affecting unspecified side: Secondary | ICD-10-CM | POA: Diagnosis not present

## 2012-11-14 DIAGNOSIS — I6789 Other cerebrovascular disease: Secondary | ICD-10-CM | POA: Diagnosis not present

## 2012-11-14 DIAGNOSIS — R5383 Other fatigue: Secondary | ICD-10-CM | POA: Diagnosis not present

## 2012-11-14 DIAGNOSIS — I6350 Cerebral infarction due to unspecified occlusion or stenosis of unspecified cerebral artery: Secondary | ICD-10-CM | POA: Diagnosis not present

## 2012-11-14 DIAGNOSIS — I63239 Cerebral infarction due to unspecified occlusion or stenosis of unspecified carotid arteries: Secondary | ICD-10-CM | POA: Diagnosis not present

## 2012-11-14 DIAGNOSIS — R29818 Other symptoms and signs involving the nervous system: Secondary | ICD-10-CM | POA: Diagnosis not present

## 2012-11-14 DIAGNOSIS — I10 Essential (primary) hypertension: Secondary | ICD-10-CM | POA: Diagnosis not present

## 2012-11-14 DIAGNOSIS — R5381 Other malaise: Secondary | ICD-10-CM | POA: Diagnosis not present

## 2012-11-15 DIAGNOSIS — I69992 Facial weakness following unspecified cerebrovascular disease: Secondary | ICD-10-CM | POA: Diagnosis not present

## 2012-11-15 DIAGNOSIS — I658 Occlusion and stenosis of other precerebral arteries: Secondary | ICD-10-CM | POA: Diagnosis not present

## 2012-11-15 DIAGNOSIS — R7309 Other abnormal glucose: Secondary | ICD-10-CM | POA: Diagnosis not present

## 2012-11-15 DIAGNOSIS — I6350 Cerebral infarction due to unspecified occlusion or stenosis of unspecified cerebral artery: Secondary | ICD-10-CM | POA: Diagnosis not present

## 2012-11-15 DIAGNOSIS — I369 Nonrheumatic tricuspid valve disorder, unspecified: Secondary | ICD-10-CM | POA: Diagnosis not present

## 2012-11-15 DIAGNOSIS — I63239 Cerebral infarction due to unspecified occlusion or stenosis of unspecified carotid arteries: Secondary | ICD-10-CM | POA: Diagnosis not present

## 2012-11-15 DIAGNOSIS — I10 Essential (primary) hypertension: Secondary | ICD-10-CM | POA: Diagnosis not present

## 2012-11-15 DIAGNOSIS — E118 Type 2 diabetes mellitus with unspecified complications: Secondary | ICD-10-CM | POA: Diagnosis not present

## 2012-11-16 DIAGNOSIS — I63239 Cerebral infarction due to unspecified occlusion or stenosis of unspecified carotid arteries: Secondary | ICD-10-CM | POA: Diagnosis not present

## 2012-11-16 DIAGNOSIS — I69992 Facial weakness following unspecified cerebrovascular disease: Secondary | ICD-10-CM | POA: Diagnosis not present

## 2012-11-16 DIAGNOSIS — I6350 Cerebral infarction due to unspecified occlusion or stenosis of unspecified cerebral artery: Secondary | ICD-10-CM | POA: Diagnosis not present

## 2012-11-16 DIAGNOSIS — R7309 Other abnormal glucose: Secondary | ICD-10-CM | POA: Diagnosis not present

## 2012-11-16 DIAGNOSIS — E118 Type 2 diabetes mellitus with unspecified complications: Secondary | ICD-10-CM | POA: Diagnosis not present

## 2012-11-16 DIAGNOSIS — I10 Essential (primary) hypertension: Secondary | ICD-10-CM | POA: Diagnosis not present

## 2012-11-17 DIAGNOSIS — R7309 Other abnormal glucose: Secondary | ICD-10-CM | POA: Diagnosis not present

## 2012-11-17 DIAGNOSIS — E118 Type 2 diabetes mellitus with unspecified complications: Secondary | ICD-10-CM | POA: Diagnosis not present

## 2012-11-17 DIAGNOSIS — I10 Essential (primary) hypertension: Secondary | ICD-10-CM | POA: Diagnosis not present

## 2012-11-17 DIAGNOSIS — I6350 Cerebral infarction due to unspecified occlusion or stenosis of unspecified cerebral artery: Secondary | ICD-10-CM | POA: Diagnosis not present

## 2012-11-17 DIAGNOSIS — I63239 Cerebral infarction due to unspecified occlusion or stenosis of unspecified carotid arteries: Secondary | ICD-10-CM | POA: Diagnosis not present

## 2012-11-17 DIAGNOSIS — J189 Pneumonia, unspecified organism: Secondary | ICD-10-CM | POA: Diagnosis not present

## 2012-11-17 DIAGNOSIS — I69992 Facial weakness following unspecified cerebrovascular disease: Secondary | ICD-10-CM | POA: Diagnosis not present

## 2012-11-18 DIAGNOSIS — I69959 Hemiplegia and hemiparesis following unspecified cerebrovascular disease affecting unspecified side: Secondary | ICD-10-CM | POA: Diagnosis not present

## 2012-11-18 DIAGNOSIS — R7309 Other abnormal glucose: Secondary | ICD-10-CM | POA: Diagnosis not present

## 2012-11-18 DIAGNOSIS — I10 Essential (primary) hypertension: Secondary | ICD-10-CM | POA: Diagnosis not present

## 2012-11-18 DIAGNOSIS — T17308A Unspecified foreign body in larynx causing other injury, initial encounter: Secondary | ICD-10-CM | POA: Diagnosis not present

## 2012-11-18 DIAGNOSIS — R269 Unspecified abnormalities of gait and mobility: Secondary | ICD-10-CM | POA: Diagnosis not present

## 2012-11-18 DIAGNOSIS — I69992 Facial weakness following unspecified cerebrovascular disease: Secondary | ICD-10-CM | POA: Diagnosis not present

## 2012-11-18 DIAGNOSIS — Z96649 Presence of unspecified artificial hip joint: Secondary | ICD-10-CM | POA: Diagnosis not present

## 2012-11-18 DIAGNOSIS — I6350 Cerebral infarction due to unspecified occlusion or stenosis of unspecified cerebral artery: Secondary | ICD-10-CM | POA: Diagnosis not present

## 2012-11-18 DIAGNOSIS — I69921 Dysphasia following unspecified cerebrovascular disease: Secondary | ICD-10-CM | POA: Diagnosis not present

## 2012-11-18 DIAGNOSIS — M25559 Pain in unspecified hip: Secondary | ICD-10-CM | POA: Diagnosis not present

## 2012-11-18 DIAGNOSIS — R4789 Other speech disturbances: Secondary | ICD-10-CM | POA: Diagnosis not present

## 2012-11-18 DIAGNOSIS — E118 Type 2 diabetes mellitus with unspecified complications: Secondary | ICD-10-CM | POA: Diagnosis not present

## 2012-11-18 DIAGNOSIS — I63239 Cerebral infarction due to unspecified occlusion or stenosis of unspecified carotid arteries: Secondary | ICD-10-CM | POA: Diagnosis not present

## 2012-11-19 DIAGNOSIS — E119 Type 2 diabetes mellitus without complications: Secondary | ICD-10-CM | POA: Diagnosis not present

## 2012-11-19 DIAGNOSIS — G4700 Insomnia, unspecified: Secondary | ICD-10-CM | POA: Diagnosis not present

## 2012-11-19 DIAGNOSIS — M545 Low back pain: Secondary | ICD-10-CM | POA: Diagnosis not present

## 2012-11-19 DIAGNOSIS — J449 Chronic obstructive pulmonary disease, unspecified: Secondary | ICD-10-CM | POA: Diagnosis not present

## 2012-11-19 DIAGNOSIS — Z794 Long term (current) use of insulin: Secondary | ICD-10-CM | POA: Diagnosis not present

## 2012-11-19 DIAGNOSIS — E538 Deficiency of other specified B group vitamins: Secondary | ICD-10-CM | POA: Diagnosis not present

## 2012-11-19 DIAGNOSIS — R05 Cough: Secondary | ICD-10-CM | POA: Diagnosis not present

## 2012-11-19 DIAGNOSIS — I679 Cerebrovascular disease, unspecified: Secondary | ICD-10-CM | POA: Diagnosis not present

## 2012-11-19 DIAGNOSIS — I69921 Dysphasia following unspecified cerebrovascular disease: Secondary | ICD-10-CM | POA: Diagnosis not present

## 2012-11-19 DIAGNOSIS — I6789 Other cerebrovascular disease: Secondary | ICD-10-CM | POA: Diagnosis not present

## 2012-11-19 DIAGNOSIS — G5790 Unspecified mononeuropathy of unspecified lower limb: Secondary | ICD-10-CM | POA: Diagnosis not present

## 2012-11-19 DIAGNOSIS — I69959 Hemiplegia and hemiparesis following unspecified cerebrovascular disease affecting unspecified side: Secondary | ICD-10-CM | POA: Diagnosis not present

## 2012-11-19 DIAGNOSIS — I6350 Cerebral infarction due to unspecified occlusion or stenosis of unspecified cerebral artery: Secondary | ICD-10-CM | POA: Diagnosis not present

## 2012-11-19 DIAGNOSIS — R471 Dysarthria and anarthria: Secondary | ICD-10-CM | POA: Diagnosis not present

## 2012-11-19 DIAGNOSIS — E118 Type 2 diabetes mellitus with unspecified complications: Secondary | ICD-10-CM | POA: Diagnosis not present

## 2012-11-19 DIAGNOSIS — F329 Major depressive disorder, single episode, unspecified: Secondary | ICD-10-CM | POA: Diagnosis not present

## 2012-11-19 DIAGNOSIS — G8929 Other chronic pain: Secondary | ICD-10-CM | POA: Diagnosis not present

## 2012-11-19 DIAGNOSIS — R7309 Other abnormal glucose: Secondary | ICD-10-CM | POA: Diagnosis not present

## 2012-11-19 DIAGNOSIS — M6281 Muscle weakness (generalized): Secondary | ICD-10-CM | POA: Diagnosis not present

## 2012-11-19 DIAGNOSIS — D518 Other vitamin B12 deficiency anemias: Secondary | ICD-10-CM | POA: Diagnosis not present

## 2012-11-19 DIAGNOSIS — Z5189 Encounter for other specified aftercare: Secondary | ICD-10-CM | POA: Diagnosis not present

## 2012-11-19 DIAGNOSIS — I69992 Facial weakness following unspecified cerebrovascular disease: Secondary | ICD-10-CM | POA: Diagnosis not present

## 2012-11-19 DIAGNOSIS — I2510 Atherosclerotic heart disease of native coronary artery without angina pectoris: Secondary | ICD-10-CM | POA: Diagnosis not present

## 2012-11-19 DIAGNOSIS — M1990 Unspecified osteoarthritis, unspecified site: Secondary | ICD-10-CM | POA: Diagnosis not present

## 2012-11-19 DIAGNOSIS — F411 Generalized anxiety disorder: Secondary | ICD-10-CM | POA: Diagnosis not present

## 2012-11-19 DIAGNOSIS — F29 Unspecified psychosis not due to a substance or known physiological condition: Secondary | ICD-10-CM | POA: Diagnosis not present

## 2012-11-19 DIAGNOSIS — N390 Urinary tract infection, site not specified: Secondary | ICD-10-CM | POA: Diagnosis not present

## 2012-11-19 DIAGNOSIS — M25469 Effusion, unspecified knee: Secondary | ICD-10-CM | POA: Diagnosis not present

## 2012-11-19 DIAGNOSIS — M549 Dorsalgia, unspecified: Secondary | ICD-10-CM | POA: Diagnosis not present

## 2012-11-19 DIAGNOSIS — Z9861 Coronary angioplasty status: Secondary | ICD-10-CM | POA: Diagnosis not present

## 2012-11-19 DIAGNOSIS — R269 Unspecified abnormalities of gait and mobility: Secondary | ICD-10-CM | POA: Diagnosis not present

## 2012-11-19 DIAGNOSIS — I6990 Unspecified sequelae of unspecified cerebrovascular disease: Secondary | ICD-10-CM | POA: Diagnosis not present

## 2012-11-19 DIAGNOSIS — R1310 Dysphagia, unspecified: Secondary | ICD-10-CM | POA: Diagnosis not present

## 2012-11-19 DIAGNOSIS — R6889 Other general symptoms and signs: Secondary | ICD-10-CM | POA: Diagnosis not present

## 2012-11-19 DIAGNOSIS — I69991 Dysphagia following unspecified cerebrovascular disease: Secondary | ICD-10-CM | POA: Diagnosis not present

## 2012-11-19 DIAGNOSIS — R197 Diarrhea, unspecified: Secondary | ICD-10-CM | POA: Diagnosis not present

## 2012-11-19 DIAGNOSIS — I10 Essential (primary) hypertension: Secondary | ICD-10-CM | POA: Diagnosis not present

## 2012-11-19 DIAGNOSIS — E039 Hypothyroidism, unspecified: Secondary | ICD-10-CM | POA: Diagnosis not present

## 2012-11-19 DIAGNOSIS — I63239 Cerebral infarction due to unspecified occlusion or stenosis of unspecified carotid arteries: Secondary | ICD-10-CM | POA: Diagnosis not present

## 2012-11-19 DIAGNOSIS — M109 Gout, unspecified: Secondary | ICD-10-CM | POA: Diagnosis not present

## 2012-11-19 DIAGNOSIS — I69922 Dysarthria following unspecified cerebrovascular disease: Secondary | ICD-10-CM | POA: Diagnosis not present

## 2012-12-18 DIAGNOSIS — F411 Generalized anxiety disorder: Secondary | ICD-10-CM | POA: Diagnosis not present

## 2012-12-18 DIAGNOSIS — R269 Unspecified abnormalities of gait and mobility: Secondary | ICD-10-CM | POA: Diagnosis not present

## 2012-12-18 DIAGNOSIS — F29 Unspecified psychosis not due to a substance or known physiological condition: Secondary | ICD-10-CM | POA: Diagnosis not present

## 2012-12-18 DIAGNOSIS — I69921 Dysphasia following unspecified cerebrovascular disease: Secondary | ICD-10-CM | POA: Diagnosis not present

## 2012-12-18 DIAGNOSIS — I6990 Unspecified sequelae of unspecified cerebrovascular disease: Secondary | ICD-10-CM | POA: Diagnosis not present

## 2012-12-18 DIAGNOSIS — I119 Hypertensive heart disease without heart failure: Secondary | ICD-10-CM | POA: Diagnosis not present

## 2012-12-18 DIAGNOSIS — F329 Major depressive disorder, single episode, unspecified: Secondary | ICD-10-CM | POA: Diagnosis not present

## 2012-12-18 DIAGNOSIS — G459 Transient cerebral ischemic attack, unspecified: Secondary | ICD-10-CM | POA: Diagnosis not present

## 2012-12-18 DIAGNOSIS — M6281 Muscle weakness (generalized): Secondary | ICD-10-CM | POA: Diagnosis not present

## 2012-12-18 DIAGNOSIS — I69959 Hemiplegia and hemiparesis following unspecified cerebrovascular disease affecting unspecified side: Secondary | ICD-10-CM | POA: Diagnosis not present

## 2012-12-18 DIAGNOSIS — G43909 Migraine, unspecified, not intractable, without status migrainosus: Secondary | ICD-10-CM | POA: Diagnosis not present

## 2012-12-18 DIAGNOSIS — R6889 Other general symptoms and signs: Secondary | ICD-10-CM | POA: Diagnosis not present

## 2012-12-18 DIAGNOSIS — N390 Urinary tract infection, site not specified: Secondary | ICD-10-CM | POA: Diagnosis not present

## 2012-12-18 DIAGNOSIS — R7309 Other abnormal glucose: Secondary | ICD-10-CM | POA: Diagnosis not present

## 2012-12-18 DIAGNOSIS — J189 Pneumonia, unspecified organism: Secondary | ICD-10-CM | POA: Diagnosis not present

## 2012-12-18 DIAGNOSIS — J449 Chronic obstructive pulmonary disease, unspecified: Secondary | ICD-10-CM | POA: Diagnosis not present

## 2012-12-18 DIAGNOSIS — M25539 Pain in unspecified wrist: Secondary | ICD-10-CM | POA: Diagnosis not present

## 2012-12-18 DIAGNOSIS — D518 Other vitamin B12 deficiency anemias: Secondary | ICD-10-CM | POA: Diagnosis not present

## 2012-12-18 DIAGNOSIS — M25559 Pain in unspecified hip: Secondary | ICD-10-CM | POA: Diagnosis not present

## 2012-12-18 DIAGNOSIS — R471 Dysarthria and anarthria: Secondary | ICD-10-CM | POA: Diagnosis not present

## 2012-12-18 DIAGNOSIS — E559 Vitamin D deficiency, unspecified: Secondary | ICD-10-CM | POA: Diagnosis not present

## 2012-12-18 DIAGNOSIS — M25569 Pain in unspecified knee: Secondary | ICD-10-CM | POA: Diagnosis not present

## 2012-12-18 DIAGNOSIS — R51 Headache: Secondary | ICD-10-CM | POA: Diagnosis not present

## 2012-12-18 DIAGNOSIS — R5381 Other malaise: Secondary | ICD-10-CM | POA: Diagnosis not present

## 2012-12-18 DIAGNOSIS — M545 Low back pain: Secondary | ICD-10-CM | POA: Diagnosis not present

## 2012-12-18 DIAGNOSIS — R1310 Dysphagia, unspecified: Secondary | ICD-10-CM | POA: Diagnosis not present

## 2012-12-18 DIAGNOSIS — E039 Hypothyroidism, unspecified: Secondary | ICD-10-CM | POA: Diagnosis not present

## 2012-12-18 DIAGNOSIS — E119 Type 2 diabetes mellitus without complications: Secondary | ICD-10-CM | POA: Diagnosis not present

## 2012-12-18 DIAGNOSIS — I679 Cerebrovascular disease, unspecified: Secondary | ICD-10-CM | POA: Diagnosis not present

## 2012-12-18 DIAGNOSIS — I6350 Cerebral infarction due to unspecified occlusion or stenosis of unspecified cerebral artery: Secondary | ICD-10-CM | POA: Diagnosis not present

## 2012-12-18 DIAGNOSIS — I6789 Other cerebrovascular disease: Secondary | ICD-10-CM | POA: Diagnosis not present

## 2012-12-19 DIAGNOSIS — I119 Hypertensive heart disease without heart failure: Secondary | ICD-10-CM | POA: Diagnosis not present

## 2012-12-19 DIAGNOSIS — E119 Type 2 diabetes mellitus without complications: Secondary | ICD-10-CM | POA: Diagnosis not present

## 2012-12-19 DIAGNOSIS — R269 Unspecified abnormalities of gait and mobility: Secondary | ICD-10-CM | POA: Diagnosis not present

## 2012-12-19 DIAGNOSIS — I69959 Hemiplegia and hemiparesis following unspecified cerebrovascular disease affecting unspecified side: Secondary | ICD-10-CM | POA: Diagnosis not present

## 2013-01-06 DIAGNOSIS — F29 Unspecified psychosis not due to a substance or known physiological condition: Secondary | ICD-10-CM | POA: Diagnosis not present

## 2013-01-06 DIAGNOSIS — R5381 Other malaise: Secondary | ICD-10-CM | POA: Diagnosis not present

## 2013-01-06 DIAGNOSIS — I6350 Cerebral infarction due to unspecified occlusion or stenosis of unspecified cerebral artery: Secondary | ICD-10-CM | POA: Diagnosis not present

## 2013-01-06 DIAGNOSIS — R51 Headache: Secondary | ICD-10-CM | POA: Diagnosis not present

## 2013-01-06 DIAGNOSIS — J189 Pneumonia, unspecified organism: Secondary | ICD-10-CM | POA: Diagnosis not present

## 2013-01-06 DIAGNOSIS — R7309 Other abnormal glucose: Secondary | ICD-10-CM | POA: Diagnosis not present

## 2013-01-06 DIAGNOSIS — G43909 Migraine, unspecified, not intractable, without status migrainosus: Secondary | ICD-10-CM | POA: Diagnosis not present

## 2013-01-07 DIAGNOSIS — I119 Hypertensive heart disease without heart failure: Secondary | ICD-10-CM | POA: Diagnosis not present

## 2013-01-07 DIAGNOSIS — I69959 Hemiplegia and hemiparesis following unspecified cerebrovascular disease affecting unspecified side: Secondary | ICD-10-CM | POA: Diagnosis not present

## 2013-01-11 DIAGNOSIS — I69998 Other sequelae following unspecified cerebrovascular disease: Secondary | ICD-10-CM | POA: Diagnosis not present

## 2013-01-11 DIAGNOSIS — I69922 Dysarthria following unspecified cerebrovascular disease: Secondary | ICD-10-CM | POA: Diagnosis not present

## 2013-01-11 DIAGNOSIS — F329 Major depressive disorder, single episode, unspecified: Secondary | ICD-10-CM | POA: Diagnosis not present

## 2013-01-11 DIAGNOSIS — Z9181 History of falling: Secondary | ICD-10-CM | POA: Diagnosis not present

## 2013-01-11 DIAGNOSIS — F411 Generalized anxiety disorder: Secondary | ICD-10-CM | POA: Diagnosis not present

## 2013-01-11 DIAGNOSIS — I10 Essential (primary) hypertension: Secondary | ICD-10-CM | POA: Diagnosis not present

## 2013-01-11 DIAGNOSIS — R1310 Dysphagia, unspecified: Secondary | ICD-10-CM | POA: Diagnosis not present

## 2013-01-11 DIAGNOSIS — Z794 Long term (current) use of insulin: Secondary | ICD-10-CM | POA: Diagnosis not present

## 2013-01-11 DIAGNOSIS — Z993 Dependence on wheelchair: Secondary | ICD-10-CM | POA: Diagnosis not present

## 2013-01-11 DIAGNOSIS — I2510 Atherosclerotic heart disease of native coronary artery without angina pectoris: Secondary | ICD-10-CM | POA: Diagnosis not present

## 2013-01-11 DIAGNOSIS — R482 Apraxia: Secondary | ICD-10-CM | POA: Diagnosis not present

## 2013-01-11 DIAGNOSIS — J449 Chronic obstructive pulmonary disease, unspecified: Secondary | ICD-10-CM | POA: Diagnosis not present

## 2013-01-11 DIAGNOSIS — I69959 Hemiplegia and hemiparesis following unspecified cerebrovascular disease affecting unspecified side: Secondary | ICD-10-CM | POA: Diagnosis not present

## 2013-01-11 DIAGNOSIS — E119 Type 2 diabetes mellitus without complications: Secondary | ICD-10-CM | POA: Diagnosis not present

## 2013-01-13 DIAGNOSIS — I69959 Hemiplegia and hemiparesis following unspecified cerebrovascular disease affecting unspecified side: Secondary | ICD-10-CM | POA: Diagnosis not present

## 2013-01-13 DIAGNOSIS — I2510 Atherosclerotic heart disease of native coronary artery without angina pectoris: Secondary | ICD-10-CM | POA: Diagnosis not present

## 2013-01-13 DIAGNOSIS — J449 Chronic obstructive pulmonary disease, unspecified: Secondary | ICD-10-CM | POA: Diagnosis not present

## 2013-01-13 DIAGNOSIS — R1310 Dysphagia, unspecified: Secondary | ICD-10-CM | POA: Diagnosis not present

## 2013-01-13 DIAGNOSIS — E119 Type 2 diabetes mellitus without complications: Secondary | ICD-10-CM | POA: Diagnosis not present

## 2013-01-13 DIAGNOSIS — R482 Apraxia: Secondary | ICD-10-CM | POA: Diagnosis not present

## 2013-01-14 DIAGNOSIS — J449 Chronic obstructive pulmonary disease, unspecified: Secondary | ICD-10-CM | POA: Diagnosis not present

## 2013-01-14 DIAGNOSIS — R482 Apraxia: Secondary | ICD-10-CM | POA: Diagnosis not present

## 2013-01-14 DIAGNOSIS — I2510 Atherosclerotic heart disease of native coronary artery without angina pectoris: Secondary | ICD-10-CM | POA: Diagnosis not present

## 2013-01-14 DIAGNOSIS — R1310 Dysphagia, unspecified: Secondary | ICD-10-CM | POA: Diagnosis not present

## 2013-01-14 DIAGNOSIS — I69959 Hemiplegia and hemiparesis following unspecified cerebrovascular disease affecting unspecified side: Secondary | ICD-10-CM | POA: Diagnosis not present

## 2013-01-14 DIAGNOSIS — E119 Type 2 diabetes mellitus without complications: Secondary | ICD-10-CM | POA: Diagnosis not present

## 2013-01-17 DIAGNOSIS — I2510 Atherosclerotic heart disease of native coronary artery without angina pectoris: Secondary | ICD-10-CM | POA: Diagnosis not present

## 2013-01-17 DIAGNOSIS — I69959 Hemiplegia and hemiparesis following unspecified cerebrovascular disease affecting unspecified side: Secondary | ICD-10-CM | POA: Diagnosis not present

## 2013-01-17 DIAGNOSIS — R1310 Dysphagia, unspecified: Secondary | ICD-10-CM | POA: Diagnosis not present

## 2013-01-17 DIAGNOSIS — E119 Type 2 diabetes mellitus without complications: Secondary | ICD-10-CM | POA: Diagnosis not present

## 2013-01-17 DIAGNOSIS — J449 Chronic obstructive pulmonary disease, unspecified: Secondary | ICD-10-CM | POA: Diagnosis not present

## 2013-01-17 DIAGNOSIS — R482 Apraxia: Secondary | ICD-10-CM | POA: Diagnosis not present

## 2013-01-20 DIAGNOSIS — I69959 Hemiplegia and hemiparesis following unspecified cerebrovascular disease affecting unspecified side: Secondary | ICD-10-CM | POA: Diagnosis not present

## 2013-01-20 DIAGNOSIS — R1310 Dysphagia, unspecified: Secondary | ICD-10-CM | POA: Diagnosis not present

## 2013-01-20 DIAGNOSIS — J449 Chronic obstructive pulmonary disease, unspecified: Secondary | ICD-10-CM | POA: Diagnosis not present

## 2013-01-20 DIAGNOSIS — E119 Type 2 diabetes mellitus without complications: Secondary | ICD-10-CM | POA: Diagnosis not present

## 2013-01-20 DIAGNOSIS — I2510 Atherosclerotic heart disease of native coronary artery without angina pectoris: Secondary | ICD-10-CM | POA: Diagnosis not present

## 2013-01-20 DIAGNOSIS — R482 Apraxia: Secondary | ICD-10-CM | POA: Diagnosis not present

## 2013-01-21 DIAGNOSIS — I69959 Hemiplegia and hemiparesis following unspecified cerebrovascular disease affecting unspecified side: Secondary | ICD-10-CM | POA: Diagnosis not present

## 2013-01-21 DIAGNOSIS — I2510 Atherosclerotic heart disease of native coronary artery without angina pectoris: Secondary | ICD-10-CM | POA: Diagnosis not present

## 2013-01-21 DIAGNOSIS — E119 Type 2 diabetes mellitus without complications: Secondary | ICD-10-CM | POA: Diagnosis not present

## 2013-01-21 DIAGNOSIS — R482 Apraxia: Secondary | ICD-10-CM | POA: Diagnosis not present

## 2013-01-21 DIAGNOSIS — R1310 Dysphagia, unspecified: Secondary | ICD-10-CM | POA: Diagnosis not present

## 2013-01-21 DIAGNOSIS — J449 Chronic obstructive pulmonary disease, unspecified: Secondary | ICD-10-CM | POA: Diagnosis not present

## 2013-01-22 DIAGNOSIS — R482 Apraxia: Secondary | ICD-10-CM | POA: Diagnosis not present

## 2013-01-22 DIAGNOSIS — I69959 Hemiplegia and hemiparesis following unspecified cerebrovascular disease affecting unspecified side: Secondary | ICD-10-CM | POA: Diagnosis not present

## 2013-01-22 DIAGNOSIS — J449 Chronic obstructive pulmonary disease, unspecified: Secondary | ICD-10-CM | POA: Diagnosis not present

## 2013-01-22 DIAGNOSIS — R1310 Dysphagia, unspecified: Secondary | ICD-10-CM | POA: Diagnosis not present

## 2013-01-22 DIAGNOSIS — I2510 Atherosclerotic heart disease of native coronary artery without angina pectoris: Secondary | ICD-10-CM | POA: Diagnosis not present

## 2013-01-22 DIAGNOSIS — E119 Type 2 diabetes mellitus without complications: Secondary | ICD-10-CM | POA: Diagnosis not present

## 2013-01-24 DIAGNOSIS — R482 Apraxia: Secondary | ICD-10-CM | POA: Diagnosis not present

## 2013-01-24 DIAGNOSIS — I2510 Atherosclerotic heart disease of native coronary artery without angina pectoris: Secondary | ICD-10-CM | POA: Diagnosis not present

## 2013-01-24 DIAGNOSIS — R1310 Dysphagia, unspecified: Secondary | ICD-10-CM | POA: Diagnosis not present

## 2013-01-24 DIAGNOSIS — J449 Chronic obstructive pulmonary disease, unspecified: Secondary | ICD-10-CM | POA: Diagnosis not present

## 2013-01-24 DIAGNOSIS — E119 Type 2 diabetes mellitus without complications: Secondary | ICD-10-CM | POA: Diagnosis not present

## 2013-01-24 DIAGNOSIS — I69959 Hemiplegia and hemiparesis following unspecified cerebrovascular disease affecting unspecified side: Secondary | ICD-10-CM | POA: Diagnosis not present

## 2013-01-27 DIAGNOSIS — I69959 Hemiplegia and hemiparesis following unspecified cerebrovascular disease affecting unspecified side: Secondary | ICD-10-CM | POA: Diagnosis not present

## 2013-01-27 DIAGNOSIS — E119 Type 2 diabetes mellitus without complications: Secondary | ICD-10-CM | POA: Diagnosis not present

## 2013-01-27 DIAGNOSIS — J449 Chronic obstructive pulmonary disease, unspecified: Secondary | ICD-10-CM | POA: Diagnosis not present

## 2013-01-27 DIAGNOSIS — R1310 Dysphagia, unspecified: Secondary | ICD-10-CM | POA: Diagnosis not present

## 2013-01-27 DIAGNOSIS — R482 Apraxia: Secondary | ICD-10-CM | POA: Diagnosis not present

## 2013-01-27 DIAGNOSIS — I2510 Atherosclerotic heart disease of native coronary artery without angina pectoris: Secondary | ICD-10-CM | POA: Diagnosis not present

## 2013-01-28 DIAGNOSIS — I2510 Atherosclerotic heart disease of native coronary artery without angina pectoris: Secondary | ICD-10-CM | POA: Diagnosis not present

## 2013-01-28 DIAGNOSIS — I69959 Hemiplegia and hemiparesis following unspecified cerebrovascular disease affecting unspecified side: Secondary | ICD-10-CM | POA: Diagnosis not present

## 2013-01-28 DIAGNOSIS — J449 Chronic obstructive pulmonary disease, unspecified: Secondary | ICD-10-CM | POA: Diagnosis not present

## 2013-01-28 DIAGNOSIS — R482 Apraxia: Secondary | ICD-10-CM | POA: Diagnosis not present

## 2013-01-28 DIAGNOSIS — E119 Type 2 diabetes mellitus without complications: Secondary | ICD-10-CM | POA: Diagnosis not present

## 2013-01-28 DIAGNOSIS — R1310 Dysphagia, unspecified: Secondary | ICD-10-CM | POA: Diagnosis not present

## 2013-01-29 DIAGNOSIS — E119 Type 2 diabetes mellitus without complications: Secondary | ICD-10-CM | POA: Diagnosis not present

## 2013-01-29 DIAGNOSIS — R482 Apraxia: Secondary | ICD-10-CM | POA: Diagnosis not present

## 2013-01-29 DIAGNOSIS — I2510 Atherosclerotic heart disease of native coronary artery without angina pectoris: Secondary | ICD-10-CM | POA: Diagnosis not present

## 2013-01-29 DIAGNOSIS — J449 Chronic obstructive pulmonary disease, unspecified: Secondary | ICD-10-CM | POA: Diagnosis not present

## 2013-01-29 DIAGNOSIS — R1310 Dysphagia, unspecified: Secondary | ICD-10-CM | POA: Diagnosis not present

## 2013-01-29 DIAGNOSIS — I69959 Hemiplegia and hemiparesis following unspecified cerebrovascular disease affecting unspecified side: Secondary | ICD-10-CM | POA: Diagnosis not present

## 2013-01-31 DIAGNOSIS — J449 Chronic obstructive pulmonary disease, unspecified: Secondary | ICD-10-CM | POA: Diagnosis not present

## 2013-01-31 DIAGNOSIS — E119 Type 2 diabetes mellitus without complications: Secondary | ICD-10-CM | POA: Diagnosis not present

## 2013-01-31 DIAGNOSIS — I2510 Atherosclerotic heart disease of native coronary artery without angina pectoris: Secondary | ICD-10-CM | POA: Diagnosis not present

## 2013-01-31 DIAGNOSIS — I69959 Hemiplegia and hemiparesis following unspecified cerebrovascular disease affecting unspecified side: Secondary | ICD-10-CM | POA: Diagnosis not present

## 2013-01-31 DIAGNOSIS — R482 Apraxia: Secondary | ICD-10-CM | POA: Diagnosis not present

## 2013-01-31 DIAGNOSIS — R1310 Dysphagia, unspecified: Secondary | ICD-10-CM | POA: Diagnosis not present

## 2013-02-04 DIAGNOSIS — R1310 Dysphagia, unspecified: Secondary | ICD-10-CM | POA: Diagnosis not present

## 2013-02-04 DIAGNOSIS — R482 Apraxia: Secondary | ICD-10-CM | POA: Diagnosis not present

## 2013-02-04 DIAGNOSIS — I2510 Atherosclerotic heart disease of native coronary artery without angina pectoris: Secondary | ICD-10-CM | POA: Diagnosis not present

## 2013-02-04 DIAGNOSIS — E119 Type 2 diabetes mellitus without complications: Secondary | ICD-10-CM | POA: Diagnosis not present

## 2013-02-04 DIAGNOSIS — J449 Chronic obstructive pulmonary disease, unspecified: Secondary | ICD-10-CM | POA: Diagnosis not present

## 2013-02-04 DIAGNOSIS — I69959 Hemiplegia and hemiparesis following unspecified cerebrovascular disease affecting unspecified side: Secondary | ICD-10-CM | POA: Diagnosis not present

## 2013-02-05 DIAGNOSIS — R482 Apraxia: Secondary | ICD-10-CM | POA: Diagnosis not present

## 2013-02-05 DIAGNOSIS — E119 Type 2 diabetes mellitus without complications: Secondary | ICD-10-CM | POA: Diagnosis not present

## 2013-02-05 DIAGNOSIS — J449 Chronic obstructive pulmonary disease, unspecified: Secondary | ICD-10-CM | POA: Diagnosis not present

## 2013-02-05 DIAGNOSIS — R1310 Dysphagia, unspecified: Secondary | ICD-10-CM | POA: Diagnosis not present

## 2013-02-05 DIAGNOSIS — I2510 Atherosclerotic heart disease of native coronary artery without angina pectoris: Secondary | ICD-10-CM | POA: Diagnosis not present

## 2013-02-05 DIAGNOSIS — I69959 Hemiplegia and hemiparesis following unspecified cerebrovascular disease affecting unspecified side: Secondary | ICD-10-CM | POA: Diagnosis not present

## 2013-02-07 DIAGNOSIS — R1310 Dysphagia, unspecified: Secondary | ICD-10-CM | POA: Diagnosis not present

## 2013-02-07 DIAGNOSIS — J449 Chronic obstructive pulmonary disease, unspecified: Secondary | ICD-10-CM | POA: Diagnosis not present

## 2013-02-07 DIAGNOSIS — R482 Apraxia: Secondary | ICD-10-CM | POA: Diagnosis not present

## 2013-02-07 DIAGNOSIS — I2510 Atherosclerotic heart disease of native coronary artery without angina pectoris: Secondary | ICD-10-CM | POA: Diagnosis not present

## 2013-02-07 DIAGNOSIS — E119 Type 2 diabetes mellitus without complications: Secondary | ICD-10-CM | POA: Diagnosis not present

## 2013-02-07 DIAGNOSIS — I69959 Hemiplegia and hemiparesis following unspecified cerebrovascular disease affecting unspecified side: Secondary | ICD-10-CM | POA: Diagnosis not present

## 2013-02-10 DIAGNOSIS — I2510 Atherosclerotic heart disease of native coronary artery without angina pectoris: Secondary | ICD-10-CM | POA: Diagnosis not present

## 2013-02-10 DIAGNOSIS — I69959 Hemiplegia and hemiparesis following unspecified cerebrovascular disease affecting unspecified side: Secondary | ICD-10-CM | POA: Diagnosis not present

## 2013-02-10 DIAGNOSIS — J449 Chronic obstructive pulmonary disease, unspecified: Secondary | ICD-10-CM | POA: Diagnosis not present

## 2013-02-10 DIAGNOSIS — R482 Apraxia: Secondary | ICD-10-CM | POA: Diagnosis not present

## 2013-02-10 DIAGNOSIS — R1310 Dysphagia, unspecified: Secondary | ICD-10-CM | POA: Diagnosis not present

## 2013-02-10 DIAGNOSIS — I10 Essential (primary) hypertension: Secondary | ICD-10-CM | POA: Diagnosis not present

## 2013-02-10 DIAGNOSIS — E119 Type 2 diabetes mellitus without complications: Secondary | ICD-10-CM | POA: Diagnosis not present

## 2013-02-11 DIAGNOSIS — R482 Apraxia: Secondary | ICD-10-CM | POA: Diagnosis not present

## 2013-02-11 DIAGNOSIS — J449 Chronic obstructive pulmonary disease, unspecified: Secondary | ICD-10-CM | POA: Diagnosis not present

## 2013-02-11 DIAGNOSIS — I2510 Atherosclerotic heart disease of native coronary artery without angina pectoris: Secondary | ICD-10-CM | POA: Diagnosis not present

## 2013-02-11 DIAGNOSIS — E119 Type 2 diabetes mellitus without complications: Secondary | ICD-10-CM | POA: Diagnosis not present

## 2013-02-11 DIAGNOSIS — R1310 Dysphagia, unspecified: Secondary | ICD-10-CM | POA: Diagnosis not present

## 2013-02-11 DIAGNOSIS — I69959 Hemiplegia and hemiparesis following unspecified cerebrovascular disease affecting unspecified side: Secondary | ICD-10-CM | POA: Diagnosis not present

## 2013-02-12 DIAGNOSIS — R482 Apraxia: Secondary | ICD-10-CM | POA: Diagnosis not present

## 2013-02-12 DIAGNOSIS — I2510 Atherosclerotic heart disease of native coronary artery without angina pectoris: Secondary | ICD-10-CM | POA: Diagnosis not present

## 2013-02-12 DIAGNOSIS — R1310 Dysphagia, unspecified: Secondary | ICD-10-CM | POA: Diagnosis not present

## 2013-02-12 DIAGNOSIS — I69959 Hemiplegia and hemiparesis following unspecified cerebrovascular disease affecting unspecified side: Secondary | ICD-10-CM | POA: Diagnosis not present

## 2013-02-12 DIAGNOSIS — E119 Type 2 diabetes mellitus without complications: Secondary | ICD-10-CM | POA: Diagnosis not present

## 2013-02-12 DIAGNOSIS — J449 Chronic obstructive pulmonary disease, unspecified: Secondary | ICD-10-CM | POA: Diagnosis not present

## 2013-02-13 DIAGNOSIS — R1310 Dysphagia, unspecified: Secondary | ICD-10-CM | POA: Diagnosis not present

## 2013-02-13 DIAGNOSIS — E119 Type 2 diabetes mellitus without complications: Secondary | ICD-10-CM | POA: Diagnosis not present

## 2013-02-13 DIAGNOSIS — I69959 Hemiplegia and hemiparesis following unspecified cerebrovascular disease affecting unspecified side: Secondary | ICD-10-CM | POA: Diagnosis not present

## 2013-02-13 DIAGNOSIS — R482 Apraxia: Secondary | ICD-10-CM | POA: Diagnosis not present

## 2013-02-13 DIAGNOSIS — J449 Chronic obstructive pulmonary disease, unspecified: Secondary | ICD-10-CM | POA: Diagnosis not present

## 2013-02-13 DIAGNOSIS — I2510 Atherosclerotic heart disease of native coronary artery without angina pectoris: Secondary | ICD-10-CM | POA: Diagnosis not present

## 2013-02-14 DIAGNOSIS — J449 Chronic obstructive pulmonary disease, unspecified: Secondary | ICD-10-CM | POA: Diagnosis not present

## 2013-02-14 DIAGNOSIS — I2510 Atherosclerotic heart disease of native coronary artery without angina pectoris: Secondary | ICD-10-CM | POA: Diagnosis not present

## 2013-02-14 DIAGNOSIS — I69959 Hemiplegia and hemiparesis following unspecified cerebrovascular disease affecting unspecified side: Secondary | ICD-10-CM | POA: Diagnosis not present

## 2013-02-14 DIAGNOSIS — R482 Apraxia: Secondary | ICD-10-CM | POA: Diagnosis not present

## 2013-02-14 DIAGNOSIS — E119 Type 2 diabetes mellitus without complications: Secondary | ICD-10-CM | POA: Diagnosis not present

## 2013-02-14 DIAGNOSIS — R1310 Dysphagia, unspecified: Secondary | ICD-10-CM | POA: Diagnosis not present

## 2013-02-17 DIAGNOSIS — R482 Apraxia: Secondary | ICD-10-CM | POA: Diagnosis not present

## 2013-02-17 DIAGNOSIS — J449 Chronic obstructive pulmonary disease, unspecified: Secondary | ICD-10-CM | POA: Diagnosis not present

## 2013-02-17 DIAGNOSIS — R1310 Dysphagia, unspecified: Secondary | ICD-10-CM | POA: Diagnosis not present

## 2013-02-17 DIAGNOSIS — E119 Type 2 diabetes mellitus without complications: Secondary | ICD-10-CM | POA: Diagnosis not present

## 2013-02-17 DIAGNOSIS — I69959 Hemiplegia and hemiparesis following unspecified cerebrovascular disease affecting unspecified side: Secondary | ICD-10-CM | POA: Diagnosis not present

## 2013-02-17 DIAGNOSIS — I2510 Atherosclerotic heart disease of native coronary artery without angina pectoris: Secondary | ICD-10-CM | POA: Diagnosis not present

## 2013-02-19 DIAGNOSIS — E119 Type 2 diabetes mellitus without complications: Secondary | ICD-10-CM | POA: Diagnosis not present

## 2013-02-19 DIAGNOSIS — I2510 Atherosclerotic heart disease of native coronary artery without angina pectoris: Secondary | ICD-10-CM | POA: Diagnosis not present

## 2013-02-19 DIAGNOSIS — R1310 Dysphagia, unspecified: Secondary | ICD-10-CM | POA: Diagnosis not present

## 2013-02-19 DIAGNOSIS — J449 Chronic obstructive pulmonary disease, unspecified: Secondary | ICD-10-CM | POA: Diagnosis not present

## 2013-02-19 DIAGNOSIS — R482 Apraxia: Secondary | ICD-10-CM | POA: Diagnosis not present

## 2013-02-19 DIAGNOSIS — I69959 Hemiplegia and hemiparesis following unspecified cerebrovascular disease affecting unspecified side: Secondary | ICD-10-CM | POA: Diagnosis not present

## 2013-02-20 DIAGNOSIS — R482 Apraxia: Secondary | ICD-10-CM | POA: Diagnosis not present

## 2013-02-20 DIAGNOSIS — R1310 Dysphagia, unspecified: Secondary | ICD-10-CM | POA: Diagnosis not present

## 2013-02-20 DIAGNOSIS — E119 Type 2 diabetes mellitus without complications: Secondary | ICD-10-CM | POA: Diagnosis not present

## 2013-02-20 DIAGNOSIS — I69959 Hemiplegia and hemiparesis following unspecified cerebrovascular disease affecting unspecified side: Secondary | ICD-10-CM | POA: Diagnosis not present

## 2013-02-20 DIAGNOSIS — I2510 Atherosclerotic heart disease of native coronary artery without angina pectoris: Secondary | ICD-10-CM | POA: Diagnosis not present

## 2013-02-20 DIAGNOSIS — J449 Chronic obstructive pulmonary disease, unspecified: Secondary | ICD-10-CM | POA: Diagnosis not present

## 2013-02-21 DIAGNOSIS — R1310 Dysphagia, unspecified: Secondary | ICD-10-CM | POA: Diagnosis not present

## 2013-02-21 DIAGNOSIS — I2510 Atherosclerotic heart disease of native coronary artery without angina pectoris: Secondary | ICD-10-CM | POA: Diagnosis not present

## 2013-02-21 DIAGNOSIS — J449 Chronic obstructive pulmonary disease, unspecified: Secondary | ICD-10-CM | POA: Diagnosis not present

## 2013-02-21 DIAGNOSIS — R482 Apraxia: Secondary | ICD-10-CM | POA: Diagnosis not present

## 2013-02-21 DIAGNOSIS — E119 Type 2 diabetes mellitus without complications: Secondary | ICD-10-CM | POA: Diagnosis not present

## 2013-02-21 DIAGNOSIS — I69959 Hemiplegia and hemiparesis following unspecified cerebrovascular disease affecting unspecified side: Secondary | ICD-10-CM | POA: Diagnosis not present

## 2013-02-23 DIAGNOSIS — I69959 Hemiplegia and hemiparesis following unspecified cerebrovascular disease affecting unspecified side: Secondary | ICD-10-CM | POA: Diagnosis not present

## 2013-02-23 DIAGNOSIS — R079 Chest pain, unspecified: Secondary | ICD-10-CM | POA: Diagnosis not present

## 2013-02-23 DIAGNOSIS — Z794 Long term (current) use of insulin: Secondary | ICD-10-CM | POA: Diagnosis not present

## 2013-02-23 DIAGNOSIS — R51 Headache: Secondary | ICD-10-CM | POA: Diagnosis not present

## 2013-02-23 DIAGNOSIS — Z8673 Personal history of transient ischemic attack (TIA), and cerebral infarction without residual deficits: Secondary | ICD-10-CM | POA: Diagnosis not present

## 2013-02-23 DIAGNOSIS — I69922 Dysarthria following unspecified cerebrovascular disease: Secondary | ICD-10-CM | POA: Diagnosis not present

## 2013-02-23 DIAGNOSIS — F411 Generalized anxiety disorder: Secondary | ICD-10-CM | POA: Diagnosis present

## 2013-02-23 DIAGNOSIS — J449 Chronic obstructive pulmonary disease, unspecified: Secondary | ICD-10-CM | POA: Diagnosis present

## 2013-02-23 DIAGNOSIS — Z9109 Other allergy status, other than to drugs and biological substances: Secondary | ICD-10-CM | POA: Diagnosis not present

## 2013-02-23 DIAGNOSIS — D649 Anemia, unspecified: Secondary | ICD-10-CM | POA: Diagnosis not present

## 2013-02-23 DIAGNOSIS — I69921 Dysphasia following unspecified cerebrovascular disease: Secondary | ICD-10-CM | POA: Diagnosis not present

## 2013-02-23 DIAGNOSIS — R4182 Altered mental status, unspecified: Secondary | ICD-10-CM | POA: Diagnosis not present

## 2013-02-23 DIAGNOSIS — I10 Essential (primary) hypertension: Secondary | ICD-10-CM | POA: Diagnosis present

## 2013-02-23 DIAGNOSIS — G8929 Other chronic pain: Secondary | ICD-10-CM | POA: Diagnosis present

## 2013-02-23 DIAGNOSIS — G9050 Complex regional pain syndrome I, unspecified: Secondary | ICD-10-CM | POA: Diagnosis not present

## 2013-02-23 DIAGNOSIS — M549 Dorsalgia, unspecified: Secondary | ICD-10-CM | POA: Diagnosis present

## 2013-02-23 DIAGNOSIS — M79609 Pain in unspecified limb: Secondary | ICD-10-CM | POA: Diagnosis not present

## 2013-02-23 DIAGNOSIS — N390 Urinary tract infection, site not specified: Secondary | ICD-10-CM | POA: Diagnosis not present

## 2013-02-23 DIAGNOSIS — E538 Deficiency of other specified B group vitamins: Secondary | ICD-10-CM | POA: Diagnosis not present

## 2013-02-23 DIAGNOSIS — F29 Unspecified psychosis not due to a substance or known physiological condition: Secondary | ICD-10-CM | POA: Diagnosis not present

## 2013-02-23 DIAGNOSIS — I2510 Atherosclerotic heart disease of native coronary artery without angina pectoris: Secondary | ICD-10-CM | POA: Diagnosis present

## 2013-02-23 DIAGNOSIS — Z888 Allergy status to other drugs, medicaments and biological substances status: Secondary | ICD-10-CM | POA: Diagnosis not present

## 2013-02-23 DIAGNOSIS — G4700 Insomnia, unspecified: Secondary | ICD-10-CM | POA: Diagnosis present

## 2013-02-23 DIAGNOSIS — G9340 Encephalopathy, unspecified: Secondary | ICD-10-CM | POA: Diagnosis present

## 2013-02-23 DIAGNOSIS — Z91018 Allergy to other foods: Secondary | ICD-10-CM | POA: Diagnosis not present

## 2013-02-23 DIAGNOSIS — F329 Major depressive disorder, single episode, unspecified: Secondary | ICD-10-CM | POA: Diagnosis present

## 2013-02-23 DIAGNOSIS — I959 Hypotension, unspecified: Secondary | ICD-10-CM | POA: Diagnosis not present

## 2013-02-23 DIAGNOSIS — R6889 Other general symptoms and signs: Secondary | ICD-10-CM | POA: Diagnosis not present

## 2013-02-23 DIAGNOSIS — S0990XA Unspecified injury of head, initial encounter: Secondary | ICD-10-CM | POA: Diagnosis not present

## 2013-02-23 DIAGNOSIS — R609 Edema, unspecified: Secondary | ICD-10-CM | POA: Diagnosis not present

## 2013-02-23 DIAGNOSIS — F17200 Nicotine dependence, unspecified, uncomplicated: Secondary | ICD-10-CM | POA: Diagnosis present

## 2013-02-23 DIAGNOSIS — E039 Hypothyroidism, unspecified: Secondary | ICD-10-CM | POA: Diagnosis present

## 2013-02-23 DIAGNOSIS — E119 Type 2 diabetes mellitus without complications: Secondary | ICD-10-CM | POA: Diagnosis not present

## 2013-02-23 DIAGNOSIS — G9389 Other specified disorders of brain: Secondary | ICD-10-CM | POA: Diagnosis not present

## 2013-02-23 DIAGNOSIS — G90519 Complex regional pain syndrome I of unspecified upper limb: Secondary | ICD-10-CM | POA: Diagnosis not present

## 2013-02-23 DIAGNOSIS — G4733 Obstructive sleep apnea (adult) (pediatric): Secondary | ICD-10-CM | POA: Diagnosis present

## 2013-03-06 DIAGNOSIS — R482 Apraxia: Secondary | ICD-10-CM | POA: Diagnosis not present

## 2013-03-06 DIAGNOSIS — R1310 Dysphagia, unspecified: Secondary | ICD-10-CM | POA: Diagnosis not present

## 2013-03-06 DIAGNOSIS — I2510 Atherosclerotic heart disease of native coronary artery without angina pectoris: Secondary | ICD-10-CM | POA: Diagnosis not present

## 2013-03-06 DIAGNOSIS — J449 Chronic obstructive pulmonary disease, unspecified: Secondary | ICD-10-CM | POA: Diagnosis not present

## 2013-03-06 DIAGNOSIS — E119 Type 2 diabetes mellitus without complications: Secondary | ICD-10-CM | POA: Diagnosis not present

## 2013-03-06 DIAGNOSIS — I69959 Hemiplegia and hemiparesis following unspecified cerebrovascular disease affecting unspecified side: Secondary | ICD-10-CM | POA: Diagnosis not present

## 2013-03-10 DIAGNOSIS — E119 Type 2 diabetes mellitus without complications: Secondary | ICD-10-CM | POA: Diagnosis not present

## 2013-03-10 DIAGNOSIS — I10 Essential (primary) hypertension: Secondary | ICD-10-CM | POA: Diagnosis not present

## 2013-03-10 DIAGNOSIS — O339 Maternal care for disproportion, unspecified: Secondary | ICD-10-CM | POA: Diagnosis not present

## 2013-03-10 DIAGNOSIS — E039 Hypothyroidism, unspecified: Secondary | ICD-10-CM | POA: Diagnosis not present

## 2013-03-11 DIAGNOSIS — I2510 Atherosclerotic heart disease of native coronary artery without angina pectoris: Secondary | ICD-10-CM | POA: Diagnosis not present

## 2013-03-11 DIAGNOSIS — I69959 Hemiplegia and hemiparesis following unspecified cerebrovascular disease affecting unspecified side: Secondary | ICD-10-CM | POA: Diagnosis not present

## 2013-03-11 DIAGNOSIS — J449 Chronic obstructive pulmonary disease, unspecified: Secondary | ICD-10-CM | POA: Diagnosis not present

## 2013-03-11 DIAGNOSIS — R482 Apraxia: Secondary | ICD-10-CM | POA: Diagnosis not present

## 2013-03-11 DIAGNOSIS — R1310 Dysphagia, unspecified: Secondary | ICD-10-CM | POA: Diagnosis not present

## 2013-03-11 DIAGNOSIS — E119 Type 2 diabetes mellitus without complications: Secondary | ICD-10-CM | POA: Diagnosis not present

## 2013-03-12 DIAGNOSIS — Z9981 Dependence on supplemental oxygen: Secondary | ICD-10-CM | POA: Diagnosis not present

## 2013-03-12 DIAGNOSIS — Z993 Dependence on wheelchair: Secondary | ICD-10-CM | POA: Diagnosis not present

## 2013-03-12 DIAGNOSIS — L8992 Pressure ulcer of unspecified site, stage 2: Secondary | ICD-10-CM | POA: Diagnosis not present

## 2013-03-12 DIAGNOSIS — H539 Unspecified visual disturbance: Secondary | ICD-10-CM | POA: Diagnosis not present

## 2013-03-12 DIAGNOSIS — I69998 Other sequelae following unspecified cerebrovascular disease: Secondary | ICD-10-CM | POA: Diagnosis not present

## 2013-03-12 DIAGNOSIS — I69991 Dysphagia following unspecified cerebrovascular disease: Secondary | ICD-10-CM | POA: Diagnosis not present

## 2013-03-12 DIAGNOSIS — L89309 Pressure ulcer of unspecified buttock, unspecified stage: Secondary | ICD-10-CM | POA: Diagnosis not present

## 2013-03-12 DIAGNOSIS — I2510 Atherosclerotic heart disease of native coronary artery without angina pectoris: Secondary | ICD-10-CM | POA: Diagnosis not present

## 2013-03-12 DIAGNOSIS — I10 Essential (primary) hypertension: Secondary | ICD-10-CM | POA: Diagnosis not present

## 2013-03-12 DIAGNOSIS — I69922 Dysarthria following unspecified cerebrovascular disease: Secondary | ICD-10-CM | POA: Diagnosis not present

## 2013-03-12 DIAGNOSIS — F411 Generalized anxiety disorder: Secondary | ICD-10-CM | POA: Diagnosis not present

## 2013-03-12 DIAGNOSIS — I69959 Hemiplegia and hemiparesis following unspecified cerebrovascular disease affecting unspecified side: Secondary | ICD-10-CM | POA: Diagnosis not present

## 2013-03-12 DIAGNOSIS — R1310 Dysphagia, unspecified: Secondary | ICD-10-CM | POA: Diagnosis not present

## 2013-03-12 DIAGNOSIS — F329 Major depressive disorder, single episode, unspecified: Secondary | ICD-10-CM | POA: Diagnosis not present

## 2013-03-12 DIAGNOSIS — Z794 Long term (current) use of insulin: Secondary | ICD-10-CM | POA: Diagnosis not present

## 2013-03-12 DIAGNOSIS — J449 Chronic obstructive pulmonary disease, unspecified: Secondary | ICD-10-CM | POA: Diagnosis not present

## 2013-03-12 DIAGNOSIS — E119 Type 2 diabetes mellitus without complications: Secondary | ICD-10-CM | POA: Diagnosis not present

## 2013-03-12 DIAGNOSIS — L89609 Pressure ulcer of unspecified heel, unspecified stage: Secondary | ICD-10-CM | POA: Diagnosis not present

## 2013-03-12 DIAGNOSIS — Z9181 History of falling: Secondary | ICD-10-CM | POA: Diagnosis not present

## 2013-03-18 DIAGNOSIS — I10 Essential (primary) hypertension: Secondary | ICD-10-CM | POA: Diagnosis not present

## 2013-03-18 DIAGNOSIS — I69959 Hemiplegia and hemiparesis following unspecified cerebrovascular disease affecting unspecified side: Secondary | ICD-10-CM | POA: Diagnosis not present

## 2013-03-18 DIAGNOSIS — E119 Type 2 diabetes mellitus without complications: Secondary | ICD-10-CM | POA: Diagnosis not present

## 2013-03-18 DIAGNOSIS — L8992 Pressure ulcer of unspecified site, stage 2: Secondary | ICD-10-CM | POA: Diagnosis not present

## 2013-03-18 DIAGNOSIS — L89309 Pressure ulcer of unspecified buttock, unspecified stage: Secondary | ICD-10-CM | POA: Diagnosis not present

## 2013-03-18 DIAGNOSIS — L89609 Pressure ulcer of unspecified heel, unspecified stage: Secondary | ICD-10-CM | POA: Diagnosis not present

## 2013-03-21 DIAGNOSIS — E119 Type 2 diabetes mellitus without complications: Secondary | ICD-10-CM | POA: Diagnosis not present

## 2013-03-21 DIAGNOSIS — L89609 Pressure ulcer of unspecified heel, unspecified stage: Secondary | ICD-10-CM | POA: Diagnosis not present

## 2013-03-21 DIAGNOSIS — L8992 Pressure ulcer of unspecified site, stage 2: Secondary | ICD-10-CM | POA: Diagnosis not present

## 2013-03-21 DIAGNOSIS — I69959 Hemiplegia and hemiparesis following unspecified cerebrovascular disease affecting unspecified side: Secondary | ICD-10-CM | POA: Diagnosis not present

## 2013-03-21 DIAGNOSIS — I10 Essential (primary) hypertension: Secondary | ICD-10-CM | POA: Diagnosis not present

## 2013-03-21 DIAGNOSIS — L89309 Pressure ulcer of unspecified buttock, unspecified stage: Secondary | ICD-10-CM | POA: Diagnosis not present

## 2013-03-24 DIAGNOSIS — I10 Essential (primary) hypertension: Secondary | ICD-10-CM | POA: Diagnosis not present

## 2013-03-24 DIAGNOSIS — L89609 Pressure ulcer of unspecified heel, unspecified stage: Secondary | ICD-10-CM | POA: Diagnosis not present

## 2013-03-24 DIAGNOSIS — L8992 Pressure ulcer of unspecified site, stage 2: Secondary | ICD-10-CM | POA: Diagnosis not present

## 2013-03-24 DIAGNOSIS — L89309 Pressure ulcer of unspecified buttock, unspecified stage: Secondary | ICD-10-CM | POA: Diagnosis not present

## 2013-03-24 DIAGNOSIS — I69959 Hemiplegia and hemiparesis following unspecified cerebrovascular disease affecting unspecified side: Secondary | ICD-10-CM | POA: Diagnosis not present

## 2013-03-24 DIAGNOSIS — E119 Type 2 diabetes mellitus without complications: Secondary | ICD-10-CM | POA: Diagnosis not present

## 2013-03-25 DIAGNOSIS — I10 Essential (primary) hypertension: Secondary | ICD-10-CM | POA: Diagnosis not present

## 2013-03-25 DIAGNOSIS — L8992 Pressure ulcer of unspecified site, stage 2: Secondary | ICD-10-CM | POA: Diagnosis not present

## 2013-03-25 DIAGNOSIS — E119 Type 2 diabetes mellitus without complications: Secondary | ICD-10-CM | POA: Diagnosis not present

## 2013-03-25 DIAGNOSIS — I69959 Hemiplegia and hemiparesis following unspecified cerebrovascular disease affecting unspecified side: Secondary | ICD-10-CM | POA: Diagnosis not present

## 2013-03-25 DIAGNOSIS — L89609 Pressure ulcer of unspecified heel, unspecified stage: Secondary | ICD-10-CM | POA: Diagnosis not present

## 2013-03-25 DIAGNOSIS — L89309 Pressure ulcer of unspecified buttock, unspecified stage: Secondary | ICD-10-CM | POA: Diagnosis not present

## 2013-03-26 DIAGNOSIS — L89609 Pressure ulcer of unspecified heel, unspecified stage: Secondary | ICD-10-CM | POA: Diagnosis not present

## 2013-03-26 DIAGNOSIS — E119 Type 2 diabetes mellitus without complications: Secondary | ICD-10-CM | POA: Diagnosis not present

## 2013-03-26 DIAGNOSIS — I69959 Hemiplegia and hemiparesis following unspecified cerebrovascular disease affecting unspecified side: Secondary | ICD-10-CM | POA: Diagnosis not present

## 2013-03-26 DIAGNOSIS — L89309 Pressure ulcer of unspecified buttock, unspecified stage: Secondary | ICD-10-CM | POA: Diagnosis not present

## 2013-03-26 DIAGNOSIS — I10 Essential (primary) hypertension: Secondary | ICD-10-CM | POA: Diagnosis not present

## 2013-03-26 DIAGNOSIS — L8992 Pressure ulcer of unspecified site, stage 2: Secondary | ICD-10-CM | POA: Diagnosis not present

## 2013-03-27 DIAGNOSIS — E119 Type 2 diabetes mellitus without complications: Secondary | ICD-10-CM | POA: Diagnosis not present

## 2013-03-27 DIAGNOSIS — L89609 Pressure ulcer of unspecified heel, unspecified stage: Secondary | ICD-10-CM | POA: Diagnosis not present

## 2013-03-27 DIAGNOSIS — I10 Essential (primary) hypertension: Secondary | ICD-10-CM | POA: Diagnosis not present

## 2013-03-27 DIAGNOSIS — I69959 Hemiplegia and hemiparesis following unspecified cerebrovascular disease affecting unspecified side: Secondary | ICD-10-CM | POA: Diagnosis not present

## 2013-03-27 DIAGNOSIS — L89309 Pressure ulcer of unspecified buttock, unspecified stage: Secondary | ICD-10-CM | POA: Diagnosis not present

## 2013-03-27 DIAGNOSIS — L8992 Pressure ulcer of unspecified site, stage 2: Secondary | ICD-10-CM | POA: Diagnosis not present

## 2013-03-28 DIAGNOSIS — E119 Type 2 diabetes mellitus without complications: Secondary | ICD-10-CM | POA: Diagnosis not present

## 2013-03-28 DIAGNOSIS — I69959 Hemiplegia and hemiparesis following unspecified cerebrovascular disease affecting unspecified side: Secondary | ICD-10-CM | POA: Diagnosis not present

## 2013-03-28 DIAGNOSIS — L8992 Pressure ulcer of unspecified site, stage 2: Secondary | ICD-10-CM | POA: Diagnosis not present

## 2013-03-28 DIAGNOSIS — L89309 Pressure ulcer of unspecified buttock, unspecified stage: Secondary | ICD-10-CM | POA: Diagnosis not present

## 2013-03-28 DIAGNOSIS — L89609 Pressure ulcer of unspecified heel, unspecified stage: Secondary | ICD-10-CM | POA: Diagnosis not present

## 2013-03-28 DIAGNOSIS — I10 Essential (primary) hypertension: Secondary | ICD-10-CM | POA: Diagnosis not present

## 2013-03-31 DIAGNOSIS — L89609 Pressure ulcer of unspecified heel, unspecified stage: Secondary | ICD-10-CM | POA: Diagnosis not present

## 2013-03-31 DIAGNOSIS — L89309 Pressure ulcer of unspecified buttock, unspecified stage: Secondary | ICD-10-CM | POA: Diagnosis not present

## 2013-03-31 DIAGNOSIS — I10 Essential (primary) hypertension: Secondary | ICD-10-CM | POA: Diagnosis not present

## 2013-03-31 DIAGNOSIS — L8992 Pressure ulcer of unspecified site, stage 2: Secondary | ICD-10-CM | POA: Diagnosis not present

## 2013-03-31 DIAGNOSIS — I69959 Hemiplegia and hemiparesis following unspecified cerebrovascular disease affecting unspecified side: Secondary | ICD-10-CM | POA: Diagnosis not present

## 2013-03-31 DIAGNOSIS — E119 Type 2 diabetes mellitus without complications: Secondary | ICD-10-CM | POA: Diagnosis not present

## 2013-04-01 DIAGNOSIS — I69959 Hemiplegia and hemiparesis following unspecified cerebrovascular disease affecting unspecified side: Secondary | ICD-10-CM | POA: Diagnosis not present

## 2013-04-01 DIAGNOSIS — E119 Type 2 diabetes mellitus without complications: Secondary | ICD-10-CM | POA: Diagnosis not present

## 2013-04-01 DIAGNOSIS — L89309 Pressure ulcer of unspecified buttock, unspecified stage: Secondary | ICD-10-CM | POA: Diagnosis not present

## 2013-04-01 DIAGNOSIS — L8992 Pressure ulcer of unspecified site, stage 2: Secondary | ICD-10-CM | POA: Diagnosis not present

## 2013-04-01 DIAGNOSIS — I10 Essential (primary) hypertension: Secondary | ICD-10-CM | POA: Diagnosis not present

## 2013-04-01 DIAGNOSIS — L89609 Pressure ulcer of unspecified heel, unspecified stage: Secondary | ICD-10-CM | POA: Diagnosis not present

## 2013-04-02 DIAGNOSIS — L89309 Pressure ulcer of unspecified buttock, unspecified stage: Secondary | ICD-10-CM | POA: Diagnosis not present

## 2013-04-02 DIAGNOSIS — L89609 Pressure ulcer of unspecified heel, unspecified stage: Secondary | ICD-10-CM | POA: Diagnosis not present

## 2013-04-02 DIAGNOSIS — L8992 Pressure ulcer of unspecified site, stage 2: Secondary | ICD-10-CM | POA: Diagnosis not present

## 2013-04-02 DIAGNOSIS — I69959 Hemiplegia and hemiparesis following unspecified cerebrovascular disease affecting unspecified side: Secondary | ICD-10-CM | POA: Diagnosis not present

## 2013-04-02 DIAGNOSIS — I10 Essential (primary) hypertension: Secondary | ICD-10-CM | POA: Diagnosis not present

## 2013-04-02 DIAGNOSIS — E119 Type 2 diabetes mellitus without complications: Secondary | ICD-10-CM | POA: Diagnosis not present

## 2013-04-04 DIAGNOSIS — I69959 Hemiplegia and hemiparesis following unspecified cerebrovascular disease affecting unspecified side: Secondary | ICD-10-CM | POA: Diagnosis not present

## 2013-04-04 DIAGNOSIS — L89309 Pressure ulcer of unspecified buttock, unspecified stage: Secondary | ICD-10-CM | POA: Diagnosis not present

## 2013-04-04 DIAGNOSIS — L8992 Pressure ulcer of unspecified site, stage 2: Secondary | ICD-10-CM | POA: Diagnosis not present

## 2013-04-04 DIAGNOSIS — E119 Type 2 diabetes mellitus without complications: Secondary | ICD-10-CM | POA: Diagnosis not present

## 2013-04-04 DIAGNOSIS — L89609 Pressure ulcer of unspecified heel, unspecified stage: Secondary | ICD-10-CM | POA: Diagnosis not present

## 2013-04-04 DIAGNOSIS — I10 Essential (primary) hypertension: Secondary | ICD-10-CM | POA: Diagnosis not present

## 2013-04-07 DIAGNOSIS — I69959 Hemiplegia and hemiparesis following unspecified cerebrovascular disease affecting unspecified side: Secondary | ICD-10-CM | POA: Diagnosis not present

## 2013-04-07 DIAGNOSIS — L89609 Pressure ulcer of unspecified heel, unspecified stage: Secondary | ICD-10-CM | POA: Diagnosis not present

## 2013-04-07 DIAGNOSIS — I10 Essential (primary) hypertension: Secondary | ICD-10-CM | POA: Diagnosis not present

## 2013-04-07 DIAGNOSIS — E119 Type 2 diabetes mellitus without complications: Secondary | ICD-10-CM | POA: Diagnosis not present

## 2013-04-07 DIAGNOSIS — L89309 Pressure ulcer of unspecified buttock, unspecified stage: Secondary | ICD-10-CM | POA: Diagnosis not present

## 2013-04-07 DIAGNOSIS — L8992 Pressure ulcer of unspecified site, stage 2: Secondary | ICD-10-CM | POA: Diagnosis not present

## 2013-04-08 DIAGNOSIS — L89309 Pressure ulcer of unspecified buttock, unspecified stage: Secondary | ICD-10-CM | POA: Diagnosis not present

## 2013-04-08 DIAGNOSIS — L8992 Pressure ulcer of unspecified site, stage 2: Secondary | ICD-10-CM | POA: Diagnosis not present

## 2013-04-08 DIAGNOSIS — E119 Type 2 diabetes mellitus without complications: Secondary | ICD-10-CM | POA: Diagnosis not present

## 2013-04-08 DIAGNOSIS — I10 Essential (primary) hypertension: Secondary | ICD-10-CM | POA: Diagnosis not present

## 2013-04-08 DIAGNOSIS — L89609 Pressure ulcer of unspecified heel, unspecified stage: Secondary | ICD-10-CM | POA: Diagnosis not present

## 2013-04-08 DIAGNOSIS — I69959 Hemiplegia and hemiparesis following unspecified cerebrovascular disease affecting unspecified side: Secondary | ICD-10-CM | POA: Diagnosis not present

## 2013-04-09 DIAGNOSIS — L8992 Pressure ulcer of unspecified site, stage 2: Secondary | ICD-10-CM | POA: Diagnosis not present

## 2013-04-09 DIAGNOSIS — E119 Type 2 diabetes mellitus without complications: Secondary | ICD-10-CM | POA: Diagnosis not present

## 2013-04-09 DIAGNOSIS — L89609 Pressure ulcer of unspecified heel, unspecified stage: Secondary | ICD-10-CM | POA: Diagnosis not present

## 2013-04-09 DIAGNOSIS — I10 Essential (primary) hypertension: Secondary | ICD-10-CM | POA: Diagnosis not present

## 2013-04-09 DIAGNOSIS — L89309 Pressure ulcer of unspecified buttock, unspecified stage: Secondary | ICD-10-CM | POA: Diagnosis not present

## 2013-04-09 DIAGNOSIS — I69959 Hemiplegia and hemiparesis following unspecified cerebrovascular disease affecting unspecified side: Secondary | ICD-10-CM | POA: Diagnosis not present

## 2013-04-14 DIAGNOSIS — L89309 Pressure ulcer of unspecified buttock, unspecified stage: Secondary | ICD-10-CM | POA: Diagnosis not present

## 2013-04-14 DIAGNOSIS — L8992 Pressure ulcer of unspecified site, stage 2: Secondary | ICD-10-CM | POA: Diagnosis not present

## 2013-04-14 DIAGNOSIS — E119 Type 2 diabetes mellitus without complications: Secondary | ICD-10-CM | POA: Diagnosis not present

## 2013-04-14 DIAGNOSIS — I69959 Hemiplegia and hemiparesis following unspecified cerebrovascular disease affecting unspecified side: Secondary | ICD-10-CM | POA: Diagnosis not present

## 2013-04-14 DIAGNOSIS — I10 Essential (primary) hypertension: Secondary | ICD-10-CM | POA: Diagnosis not present

## 2013-04-14 DIAGNOSIS — L89609 Pressure ulcer of unspecified heel, unspecified stage: Secondary | ICD-10-CM | POA: Diagnosis not present

## 2013-04-16 DIAGNOSIS — L89609 Pressure ulcer of unspecified heel, unspecified stage: Secondary | ICD-10-CM | POA: Diagnosis not present

## 2013-04-16 DIAGNOSIS — I69959 Hemiplegia and hemiparesis following unspecified cerebrovascular disease affecting unspecified side: Secondary | ICD-10-CM | POA: Diagnosis not present

## 2013-04-16 DIAGNOSIS — I10 Essential (primary) hypertension: Secondary | ICD-10-CM | POA: Diagnosis not present

## 2013-04-16 DIAGNOSIS — L89309 Pressure ulcer of unspecified buttock, unspecified stage: Secondary | ICD-10-CM | POA: Diagnosis not present

## 2013-04-16 DIAGNOSIS — E119 Type 2 diabetes mellitus without complications: Secondary | ICD-10-CM | POA: Diagnosis not present

## 2013-04-16 DIAGNOSIS — L8992 Pressure ulcer of unspecified site, stage 2: Secondary | ICD-10-CM | POA: Diagnosis not present

## 2013-04-21 DIAGNOSIS — L89309 Pressure ulcer of unspecified buttock, unspecified stage: Secondary | ICD-10-CM | POA: Diagnosis not present

## 2013-04-21 DIAGNOSIS — E119 Type 2 diabetes mellitus without complications: Secondary | ICD-10-CM | POA: Diagnosis not present

## 2013-04-21 DIAGNOSIS — I10 Essential (primary) hypertension: Secondary | ICD-10-CM | POA: Diagnosis not present

## 2013-04-21 DIAGNOSIS — L89609 Pressure ulcer of unspecified heel, unspecified stage: Secondary | ICD-10-CM | POA: Diagnosis not present

## 2013-04-21 DIAGNOSIS — I69959 Hemiplegia and hemiparesis following unspecified cerebrovascular disease affecting unspecified side: Secondary | ICD-10-CM | POA: Diagnosis not present

## 2013-04-21 DIAGNOSIS — L8992 Pressure ulcer of unspecified site, stage 2: Secondary | ICD-10-CM | POA: Diagnosis not present

## 2013-04-22 DIAGNOSIS — L8992 Pressure ulcer of unspecified site, stage 2: Secondary | ICD-10-CM | POA: Diagnosis not present

## 2013-04-22 DIAGNOSIS — E119 Type 2 diabetes mellitus without complications: Secondary | ICD-10-CM | POA: Diagnosis not present

## 2013-04-22 DIAGNOSIS — L89309 Pressure ulcer of unspecified buttock, unspecified stage: Secondary | ICD-10-CM | POA: Diagnosis not present

## 2013-04-22 DIAGNOSIS — I69959 Hemiplegia and hemiparesis following unspecified cerebrovascular disease affecting unspecified side: Secondary | ICD-10-CM | POA: Diagnosis not present

## 2013-04-22 DIAGNOSIS — I10 Essential (primary) hypertension: Secondary | ICD-10-CM | POA: Diagnosis not present

## 2013-04-22 DIAGNOSIS — L89609 Pressure ulcer of unspecified heel, unspecified stage: Secondary | ICD-10-CM | POA: Diagnosis not present

## 2013-04-23 DIAGNOSIS — I69959 Hemiplegia and hemiparesis following unspecified cerebrovascular disease affecting unspecified side: Secondary | ICD-10-CM | POA: Diagnosis not present

## 2013-04-23 DIAGNOSIS — L89609 Pressure ulcer of unspecified heel, unspecified stage: Secondary | ICD-10-CM | POA: Diagnosis not present

## 2013-04-23 DIAGNOSIS — E119 Type 2 diabetes mellitus without complications: Secondary | ICD-10-CM | POA: Diagnosis not present

## 2013-04-23 DIAGNOSIS — L89309 Pressure ulcer of unspecified buttock, unspecified stage: Secondary | ICD-10-CM | POA: Diagnosis not present

## 2013-04-23 DIAGNOSIS — I10 Essential (primary) hypertension: Secondary | ICD-10-CM | POA: Diagnosis not present

## 2013-04-23 DIAGNOSIS — L8992 Pressure ulcer of unspecified site, stage 2: Secondary | ICD-10-CM | POA: Diagnosis not present

## 2013-04-28 DIAGNOSIS — L89609 Pressure ulcer of unspecified heel, unspecified stage: Secondary | ICD-10-CM | POA: Diagnosis not present

## 2013-04-28 DIAGNOSIS — L8992 Pressure ulcer of unspecified site, stage 2: Secondary | ICD-10-CM | POA: Diagnosis not present

## 2013-04-28 DIAGNOSIS — L89309 Pressure ulcer of unspecified buttock, unspecified stage: Secondary | ICD-10-CM | POA: Diagnosis not present

## 2013-04-28 DIAGNOSIS — E119 Type 2 diabetes mellitus without complications: Secondary | ICD-10-CM | POA: Diagnosis not present

## 2013-04-28 DIAGNOSIS — I69959 Hemiplegia and hemiparesis following unspecified cerebrovascular disease affecting unspecified side: Secondary | ICD-10-CM | POA: Diagnosis not present

## 2013-04-28 DIAGNOSIS — I10 Essential (primary) hypertension: Secondary | ICD-10-CM | POA: Diagnosis not present

## 2013-04-29 DIAGNOSIS — I69959 Hemiplegia and hemiparesis following unspecified cerebrovascular disease affecting unspecified side: Secondary | ICD-10-CM | POA: Diagnosis not present

## 2013-04-29 DIAGNOSIS — L89309 Pressure ulcer of unspecified buttock, unspecified stage: Secondary | ICD-10-CM | POA: Diagnosis not present

## 2013-04-29 DIAGNOSIS — I10 Essential (primary) hypertension: Secondary | ICD-10-CM | POA: Diagnosis not present

## 2013-04-29 DIAGNOSIS — L8992 Pressure ulcer of unspecified site, stage 2: Secondary | ICD-10-CM | POA: Diagnosis not present

## 2013-04-29 DIAGNOSIS — E119 Type 2 diabetes mellitus without complications: Secondary | ICD-10-CM | POA: Diagnosis not present

## 2013-04-29 DIAGNOSIS — L89609 Pressure ulcer of unspecified heel, unspecified stage: Secondary | ICD-10-CM | POA: Diagnosis not present

## 2013-04-30 DIAGNOSIS — I69959 Hemiplegia and hemiparesis following unspecified cerebrovascular disease affecting unspecified side: Secondary | ICD-10-CM | POA: Diagnosis not present

## 2013-04-30 DIAGNOSIS — L89609 Pressure ulcer of unspecified heel, unspecified stage: Secondary | ICD-10-CM | POA: Diagnosis not present

## 2013-04-30 DIAGNOSIS — L89309 Pressure ulcer of unspecified buttock, unspecified stage: Secondary | ICD-10-CM | POA: Diagnosis not present

## 2013-04-30 DIAGNOSIS — E119 Type 2 diabetes mellitus without complications: Secondary | ICD-10-CM | POA: Diagnosis not present

## 2013-04-30 DIAGNOSIS — L8992 Pressure ulcer of unspecified site, stage 2: Secondary | ICD-10-CM | POA: Diagnosis not present

## 2013-04-30 DIAGNOSIS — I10 Essential (primary) hypertension: Secondary | ICD-10-CM | POA: Diagnosis not present

## 2013-05-01 DIAGNOSIS — L8992 Pressure ulcer of unspecified site, stage 2: Secondary | ICD-10-CM | POA: Diagnosis not present

## 2013-05-01 DIAGNOSIS — E119 Type 2 diabetes mellitus without complications: Secondary | ICD-10-CM | POA: Diagnosis not present

## 2013-05-01 DIAGNOSIS — L89309 Pressure ulcer of unspecified buttock, unspecified stage: Secondary | ICD-10-CM | POA: Diagnosis not present

## 2013-05-01 DIAGNOSIS — L89609 Pressure ulcer of unspecified heel, unspecified stage: Secondary | ICD-10-CM | POA: Diagnosis not present

## 2013-05-01 DIAGNOSIS — I10 Essential (primary) hypertension: Secondary | ICD-10-CM | POA: Diagnosis not present

## 2013-05-01 DIAGNOSIS — I69959 Hemiplegia and hemiparesis following unspecified cerebrovascular disease affecting unspecified side: Secondary | ICD-10-CM | POA: Diagnosis not present

## 2013-05-06 DIAGNOSIS — L89609 Pressure ulcer of unspecified heel, unspecified stage: Secondary | ICD-10-CM | POA: Diagnosis not present

## 2013-05-06 DIAGNOSIS — I10 Essential (primary) hypertension: Secondary | ICD-10-CM | POA: Diagnosis not present

## 2013-05-06 DIAGNOSIS — L89309 Pressure ulcer of unspecified buttock, unspecified stage: Secondary | ICD-10-CM | POA: Diagnosis not present

## 2013-05-06 DIAGNOSIS — E119 Type 2 diabetes mellitus without complications: Secondary | ICD-10-CM | POA: Diagnosis not present

## 2013-05-06 DIAGNOSIS — I69959 Hemiplegia and hemiparesis following unspecified cerebrovascular disease affecting unspecified side: Secondary | ICD-10-CM | POA: Diagnosis not present

## 2013-05-06 DIAGNOSIS — L8992 Pressure ulcer of unspecified site, stage 2: Secondary | ICD-10-CM | POA: Diagnosis not present

## 2013-05-09 DIAGNOSIS — I69959 Hemiplegia and hemiparesis following unspecified cerebrovascular disease affecting unspecified side: Secondary | ICD-10-CM | POA: Diagnosis not present

## 2013-05-09 DIAGNOSIS — L89609 Pressure ulcer of unspecified heel, unspecified stage: Secondary | ICD-10-CM | POA: Diagnosis not present

## 2013-05-09 DIAGNOSIS — L89309 Pressure ulcer of unspecified buttock, unspecified stage: Secondary | ICD-10-CM | POA: Diagnosis not present

## 2013-05-09 DIAGNOSIS — I10 Essential (primary) hypertension: Secondary | ICD-10-CM | POA: Diagnosis not present

## 2013-05-09 DIAGNOSIS — E119 Type 2 diabetes mellitus without complications: Secondary | ICD-10-CM | POA: Diagnosis not present

## 2013-05-09 DIAGNOSIS — L8992 Pressure ulcer of unspecified site, stage 2: Secondary | ICD-10-CM | POA: Diagnosis not present

## 2013-05-11 DIAGNOSIS — I10 Essential (primary) hypertension: Secondary | ICD-10-CM | POA: Diagnosis not present

## 2013-05-11 DIAGNOSIS — Z9181 History of falling: Secondary | ICD-10-CM | POA: Diagnosis not present

## 2013-05-11 DIAGNOSIS — F329 Major depressive disorder, single episode, unspecified: Secondary | ICD-10-CM | POA: Diagnosis not present

## 2013-05-11 DIAGNOSIS — J449 Chronic obstructive pulmonary disease, unspecified: Secondary | ICD-10-CM | POA: Diagnosis not present

## 2013-05-11 DIAGNOSIS — I2510 Atherosclerotic heart disease of native coronary artery without angina pectoris: Secondary | ICD-10-CM | POA: Diagnosis not present

## 2013-05-11 DIAGNOSIS — E119 Type 2 diabetes mellitus without complications: Secondary | ICD-10-CM | POA: Diagnosis not present

## 2013-05-11 DIAGNOSIS — Z794 Long term (current) use of insulin: Secondary | ICD-10-CM | POA: Diagnosis not present

## 2013-05-11 DIAGNOSIS — IMO0001 Reserved for inherently not codable concepts without codable children: Secondary | ICD-10-CM | POA: Diagnosis not present

## 2013-05-11 DIAGNOSIS — Z9981 Dependence on supplemental oxygen: Secondary | ICD-10-CM | POA: Diagnosis not present

## 2013-05-11 DIAGNOSIS — I69959 Hemiplegia and hemiparesis following unspecified cerebrovascular disease affecting unspecified side: Secondary | ICD-10-CM | POA: Diagnosis not present

## 2013-05-11 DIAGNOSIS — F411 Generalized anxiety disorder: Secondary | ICD-10-CM | POA: Diagnosis not present

## 2013-05-13 DIAGNOSIS — I69959 Hemiplegia and hemiparesis following unspecified cerebrovascular disease affecting unspecified side: Secondary | ICD-10-CM | POA: Diagnosis not present

## 2013-05-13 DIAGNOSIS — I10 Essential (primary) hypertension: Secondary | ICD-10-CM | POA: Diagnosis not present

## 2013-05-13 DIAGNOSIS — J449 Chronic obstructive pulmonary disease, unspecified: Secondary | ICD-10-CM | POA: Diagnosis not present

## 2013-05-13 DIAGNOSIS — IMO0001 Reserved for inherently not codable concepts without codable children: Secondary | ICD-10-CM | POA: Diagnosis not present

## 2013-05-13 DIAGNOSIS — E119 Type 2 diabetes mellitus without complications: Secondary | ICD-10-CM | POA: Diagnosis not present

## 2013-05-13 DIAGNOSIS — F329 Major depressive disorder, single episode, unspecified: Secondary | ICD-10-CM | POA: Diagnosis not present

## 2013-05-16 DIAGNOSIS — F329 Major depressive disorder, single episode, unspecified: Secondary | ICD-10-CM | POA: Diagnosis not present

## 2013-05-16 DIAGNOSIS — I69959 Hemiplegia and hemiparesis following unspecified cerebrovascular disease affecting unspecified side: Secondary | ICD-10-CM | POA: Diagnosis not present

## 2013-05-16 DIAGNOSIS — IMO0001 Reserved for inherently not codable concepts without codable children: Secondary | ICD-10-CM | POA: Diagnosis not present

## 2013-05-16 DIAGNOSIS — E119 Type 2 diabetes mellitus without complications: Secondary | ICD-10-CM | POA: Diagnosis not present

## 2013-05-16 DIAGNOSIS — J449 Chronic obstructive pulmonary disease, unspecified: Secondary | ICD-10-CM | POA: Diagnosis not present

## 2013-05-16 DIAGNOSIS — I10 Essential (primary) hypertension: Secondary | ICD-10-CM | POA: Diagnosis not present

## 2013-05-20 DIAGNOSIS — I69959 Hemiplegia and hemiparesis following unspecified cerebrovascular disease affecting unspecified side: Secondary | ICD-10-CM | POA: Diagnosis not present

## 2013-05-20 DIAGNOSIS — J449 Chronic obstructive pulmonary disease, unspecified: Secondary | ICD-10-CM | POA: Diagnosis not present

## 2013-05-20 DIAGNOSIS — I10 Essential (primary) hypertension: Secondary | ICD-10-CM | POA: Diagnosis not present

## 2013-05-20 DIAGNOSIS — IMO0001 Reserved for inherently not codable concepts without codable children: Secondary | ICD-10-CM | POA: Diagnosis not present

## 2013-05-20 DIAGNOSIS — E119 Type 2 diabetes mellitus without complications: Secondary | ICD-10-CM | POA: Diagnosis not present

## 2013-05-20 DIAGNOSIS — F329 Major depressive disorder, single episode, unspecified: Secondary | ICD-10-CM | POA: Diagnosis not present

## 2013-05-23 DIAGNOSIS — I10 Essential (primary) hypertension: Secondary | ICD-10-CM | POA: Diagnosis not present

## 2013-05-23 DIAGNOSIS — E119 Type 2 diabetes mellitus without complications: Secondary | ICD-10-CM | POA: Diagnosis not present

## 2013-05-23 DIAGNOSIS — J449 Chronic obstructive pulmonary disease, unspecified: Secondary | ICD-10-CM | POA: Diagnosis not present

## 2013-05-23 DIAGNOSIS — F329 Major depressive disorder, single episode, unspecified: Secondary | ICD-10-CM | POA: Diagnosis not present

## 2013-05-23 DIAGNOSIS — IMO0001 Reserved for inherently not codable concepts without codable children: Secondary | ICD-10-CM | POA: Diagnosis not present

## 2013-05-23 DIAGNOSIS — I69959 Hemiplegia and hemiparesis following unspecified cerebrovascular disease affecting unspecified side: Secondary | ICD-10-CM | POA: Diagnosis not present

## 2013-05-27 DIAGNOSIS — I69959 Hemiplegia and hemiparesis following unspecified cerebrovascular disease affecting unspecified side: Secondary | ICD-10-CM | POA: Diagnosis not present

## 2013-05-27 DIAGNOSIS — F329 Major depressive disorder, single episode, unspecified: Secondary | ICD-10-CM | POA: Diagnosis not present

## 2013-05-27 DIAGNOSIS — J449 Chronic obstructive pulmonary disease, unspecified: Secondary | ICD-10-CM | POA: Diagnosis not present

## 2013-05-27 DIAGNOSIS — I10 Essential (primary) hypertension: Secondary | ICD-10-CM | POA: Diagnosis not present

## 2013-05-27 DIAGNOSIS — E119 Type 2 diabetes mellitus without complications: Secondary | ICD-10-CM | POA: Diagnosis not present

## 2013-05-27 DIAGNOSIS — IMO0001 Reserved for inherently not codable concepts without codable children: Secondary | ICD-10-CM | POA: Diagnosis not present

## 2013-05-29 DIAGNOSIS — E785 Hyperlipidemia, unspecified: Secondary | ICD-10-CM | POA: Diagnosis not present

## 2013-05-29 DIAGNOSIS — E119 Type 2 diabetes mellitus without complications: Secondary | ICD-10-CM | POA: Diagnosis not present

## 2013-05-29 DIAGNOSIS — Z79899 Other long term (current) drug therapy: Secondary | ICD-10-CM | POA: Diagnosis not present

## 2013-05-29 DIAGNOSIS — I6990 Unspecified sequelae of unspecified cerebrovascular disease: Secondary | ICD-10-CM | POA: Diagnosis not present

## 2013-05-29 DIAGNOSIS — R52 Pain, unspecified: Secondary | ICD-10-CM | POA: Diagnosis not present

## 2013-05-29 DIAGNOSIS — G9050 Complex regional pain syndrome I, unspecified: Secondary | ICD-10-CM | POA: Diagnosis not present

## 2013-05-29 DIAGNOSIS — E039 Hypothyroidism, unspecified: Secondary | ICD-10-CM | POA: Diagnosis not present

## 2013-05-30 DIAGNOSIS — I69959 Hemiplegia and hemiparesis following unspecified cerebrovascular disease affecting unspecified side: Secondary | ICD-10-CM | POA: Diagnosis not present

## 2013-05-30 DIAGNOSIS — IMO0001 Reserved for inherently not codable concepts without codable children: Secondary | ICD-10-CM | POA: Diagnosis not present

## 2013-05-30 DIAGNOSIS — I10 Essential (primary) hypertension: Secondary | ICD-10-CM | POA: Diagnosis not present

## 2013-05-30 DIAGNOSIS — F329 Major depressive disorder, single episode, unspecified: Secondary | ICD-10-CM | POA: Diagnosis not present

## 2013-05-30 DIAGNOSIS — J449 Chronic obstructive pulmonary disease, unspecified: Secondary | ICD-10-CM | POA: Diagnosis not present

## 2013-05-30 DIAGNOSIS — E119 Type 2 diabetes mellitus without complications: Secondary | ICD-10-CM | POA: Diagnosis not present

## 2013-06-03 DIAGNOSIS — I69959 Hemiplegia and hemiparesis following unspecified cerebrovascular disease affecting unspecified side: Secondary | ICD-10-CM | POA: Diagnosis not present

## 2013-06-03 DIAGNOSIS — J449 Chronic obstructive pulmonary disease, unspecified: Secondary | ICD-10-CM | POA: Diagnosis not present

## 2013-06-03 DIAGNOSIS — I10 Essential (primary) hypertension: Secondary | ICD-10-CM | POA: Diagnosis not present

## 2013-06-03 DIAGNOSIS — IMO0001 Reserved for inherently not codable concepts without codable children: Secondary | ICD-10-CM | POA: Diagnosis not present

## 2013-06-03 DIAGNOSIS — F329 Major depressive disorder, single episode, unspecified: Secondary | ICD-10-CM | POA: Diagnosis not present

## 2013-06-03 DIAGNOSIS — E119 Type 2 diabetes mellitus without complications: Secondary | ICD-10-CM | POA: Diagnosis not present

## 2013-06-05 DIAGNOSIS — IMO0001 Reserved for inherently not codable concepts without codable children: Secondary | ICD-10-CM | POA: Diagnosis not present

## 2013-06-05 DIAGNOSIS — J449 Chronic obstructive pulmonary disease, unspecified: Secondary | ICD-10-CM | POA: Diagnosis not present

## 2013-06-05 DIAGNOSIS — F329 Major depressive disorder, single episode, unspecified: Secondary | ICD-10-CM | POA: Diagnosis not present

## 2013-06-05 DIAGNOSIS — I69959 Hemiplegia and hemiparesis following unspecified cerebrovascular disease affecting unspecified side: Secondary | ICD-10-CM | POA: Diagnosis not present

## 2013-06-05 DIAGNOSIS — I10 Essential (primary) hypertension: Secondary | ICD-10-CM | POA: Diagnosis not present

## 2013-06-05 DIAGNOSIS — E119 Type 2 diabetes mellitus without complications: Secondary | ICD-10-CM | POA: Diagnosis not present

## 2013-06-17 DIAGNOSIS — N390 Urinary tract infection, site not specified: Secondary | ICD-10-CM | POA: Diagnosis not present

## 2013-06-17 DIAGNOSIS — L89309 Pressure ulcer of unspecified buttock, unspecified stage: Secondary | ICD-10-CM | POA: Diagnosis not present

## 2013-06-17 DIAGNOSIS — I2510 Atherosclerotic heart disease of native coronary artery without angina pectoris: Secondary | ICD-10-CM | POA: Diagnosis not present

## 2013-06-17 DIAGNOSIS — E1159 Type 2 diabetes mellitus with other circulatory complications: Secondary | ICD-10-CM | POA: Diagnosis not present

## 2013-06-17 DIAGNOSIS — J449 Chronic obstructive pulmonary disease, unspecified: Secondary | ICD-10-CM | POA: Diagnosis not present

## 2013-06-17 DIAGNOSIS — L8992 Pressure ulcer of unspecified site, stage 2: Secondary | ICD-10-CM | POA: Diagnosis not present

## 2013-06-17 DIAGNOSIS — G839 Paralytic syndrome, unspecified: Secondary | ICD-10-CM | POA: Diagnosis not present

## 2013-06-17 DIAGNOSIS — I498 Other specified cardiac arrhythmias: Secondary | ICD-10-CM | POA: Diagnosis not present

## 2013-06-17 DIAGNOSIS — G9050 Complex regional pain syndrome I, unspecified: Secondary | ICD-10-CM | POA: Diagnosis not present

## 2013-06-17 DIAGNOSIS — I798 Other disorders of arteries, arterioles and capillaries in diseases classified elsewhere: Secondary | ICD-10-CM | POA: Diagnosis not present

## 2013-06-23 DIAGNOSIS — L89309 Pressure ulcer of unspecified buttock, unspecified stage: Secondary | ICD-10-CM | POA: Diagnosis not present

## 2013-06-23 DIAGNOSIS — I798 Other disorders of arteries, arterioles and capillaries in diseases classified elsewhere: Secondary | ICD-10-CM | POA: Diagnosis not present

## 2013-06-23 DIAGNOSIS — J449 Chronic obstructive pulmonary disease, unspecified: Secondary | ICD-10-CM | POA: Diagnosis not present

## 2013-06-23 DIAGNOSIS — E1159 Type 2 diabetes mellitus with other circulatory complications: Secondary | ICD-10-CM | POA: Diagnosis not present

## 2013-06-23 DIAGNOSIS — L8992 Pressure ulcer of unspecified site, stage 2: Secondary | ICD-10-CM | POA: Diagnosis not present

## 2013-06-23 DIAGNOSIS — G9050 Complex regional pain syndrome I, unspecified: Secondary | ICD-10-CM | POA: Diagnosis not present

## 2013-06-24 DIAGNOSIS — J449 Chronic obstructive pulmonary disease, unspecified: Secondary | ICD-10-CM | POA: Diagnosis not present

## 2013-06-24 DIAGNOSIS — L8992 Pressure ulcer of unspecified site, stage 2: Secondary | ICD-10-CM | POA: Diagnosis not present

## 2013-06-24 DIAGNOSIS — G9050 Complex regional pain syndrome I, unspecified: Secondary | ICD-10-CM | POA: Diagnosis not present

## 2013-06-24 DIAGNOSIS — I798 Other disorders of arteries, arterioles and capillaries in diseases classified elsewhere: Secondary | ICD-10-CM | POA: Diagnosis not present

## 2013-06-24 DIAGNOSIS — L89309 Pressure ulcer of unspecified buttock, unspecified stage: Secondary | ICD-10-CM | POA: Diagnosis not present

## 2013-06-24 DIAGNOSIS — E1159 Type 2 diabetes mellitus with other circulatory complications: Secondary | ICD-10-CM | POA: Diagnosis not present

## 2013-07-01 DIAGNOSIS — I798 Other disorders of arteries, arterioles and capillaries in diseases classified elsewhere: Secondary | ICD-10-CM | POA: Diagnosis not present

## 2013-07-01 DIAGNOSIS — G9050 Complex regional pain syndrome I, unspecified: Secondary | ICD-10-CM | POA: Diagnosis not present

## 2013-07-01 DIAGNOSIS — L8992 Pressure ulcer of unspecified site, stage 2: Secondary | ICD-10-CM | POA: Diagnosis not present

## 2013-07-01 DIAGNOSIS — J449 Chronic obstructive pulmonary disease, unspecified: Secondary | ICD-10-CM | POA: Diagnosis not present

## 2013-07-01 DIAGNOSIS — E1159 Type 2 diabetes mellitus with other circulatory complications: Secondary | ICD-10-CM | POA: Diagnosis not present

## 2013-07-01 DIAGNOSIS — L89309 Pressure ulcer of unspecified buttock, unspecified stage: Secondary | ICD-10-CM | POA: Diagnosis not present

## 2013-07-02 DIAGNOSIS — N390 Urinary tract infection, site not specified: Secondary | ICD-10-CM | POA: Diagnosis not present

## 2013-07-02 DIAGNOSIS — L89309 Pressure ulcer of unspecified buttock, unspecified stage: Secondary | ICD-10-CM | POA: Diagnosis not present

## 2013-07-03 DIAGNOSIS — Z8673 Personal history of transient ischemic attack (TIA), and cerebral infarction without residual deficits: Secondary | ICD-10-CM | POA: Diagnosis not present

## 2013-07-03 DIAGNOSIS — E1159 Type 2 diabetes mellitus with other circulatory complications: Secondary | ICD-10-CM | POA: Diagnosis not present

## 2013-07-03 DIAGNOSIS — E039 Hypothyroidism, unspecified: Secondary | ICD-10-CM | POA: Diagnosis not present

## 2013-07-03 DIAGNOSIS — L8992 Pressure ulcer of unspecified site, stage 2: Secondary | ICD-10-CM | POA: Diagnosis not present

## 2013-07-03 DIAGNOSIS — L89309 Pressure ulcer of unspecified buttock, unspecified stage: Secondary | ICD-10-CM | POA: Diagnosis not present

## 2013-07-03 DIAGNOSIS — G9050 Complex regional pain syndrome I, unspecified: Secondary | ICD-10-CM | POA: Diagnosis not present

## 2013-07-03 DIAGNOSIS — Z23 Encounter for immunization: Secondary | ICD-10-CM | POA: Diagnosis not present

## 2013-07-03 DIAGNOSIS — E119 Type 2 diabetes mellitus without complications: Secondary | ICD-10-CM | POA: Diagnosis not present

## 2013-07-03 DIAGNOSIS — J449 Chronic obstructive pulmonary disease, unspecified: Secondary | ICD-10-CM | POA: Diagnosis not present

## 2013-07-03 DIAGNOSIS — I798 Other disorders of arteries, arterioles and capillaries in diseases classified elsewhere: Secondary | ICD-10-CM | POA: Diagnosis not present

## 2013-07-03 DIAGNOSIS — I10 Essential (primary) hypertension: Secondary | ICD-10-CM | POA: Diagnosis not present

## 2013-07-07 DIAGNOSIS — L89309 Pressure ulcer of unspecified buttock, unspecified stage: Secondary | ICD-10-CM | POA: Diagnosis not present

## 2013-07-07 DIAGNOSIS — G9050 Complex regional pain syndrome I, unspecified: Secondary | ICD-10-CM | POA: Diagnosis not present

## 2013-07-07 DIAGNOSIS — I798 Other disorders of arteries, arterioles and capillaries in diseases classified elsewhere: Secondary | ICD-10-CM | POA: Diagnosis not present

## 2013-07-07 DIAGNOSIS — J449 Chronic obstructive pulmonary disease, unspecified: Secondary | ICD-10-CM | POA: Diagnosis not present

## 2013-07-07 DIAGNOSIS — E1159 Type 2 diabetes mellitus with other circulatory complications: Secondary | ICD-10-CM | POA: Diagnosis not present

## 2013-07-07 DIAGNOSIS — L8992 Pressure ulcer of unspecified site, stage 2: Secondary | ICD-10-CM | POA: Diagnosis not present

## 2013-07-10 DIAGNOSIS — I658 Occlusion and stenosis of other precerebral arteries: Secondary | ICD-10-CM | POA: Diagnosis not present

## 2013-07-10 DIAGNOSIS — R0989 Other specified symptoms and signs involving the circulatory and respiratory systems: Secondary | ICD-10-CM | POA: Diagnosis not present

## 2013-07-10 DIAGNOSIS — I6529 Occlusion and stenosis of unspecified carotid artery: Secondary | ICD-10-CM | POA: Diagnosis not present

## 2013-07-11 DIAGNOSIS — G9050 Complex regional pain syndrome I, unspecified: Secondary | ICD-10-CM | POA: Diagnosis not present

## 2013-07-11 DIAGNOSIS — L89309 Pressure ulcer of unspecified buttock, unspecified stage: Secondary | ICD-10-CM | POA: Diagnosis not present

## 2013-07-11 DIAGNOSIS — J449 Chronic obstructive pulmonary disease, unspecified: Secondary | ICD-10-CM | POA: Diagnosis not present

## 2013-07-11 DIAGNOSIS — L8992 Pressure ulcer of unspecified site, stage 2: Secondary | ICD-10-CM | POA: Diagnosis not present

## 2013-07-11 DIAGNOSIS — I798 Other disorders of arteries, arterioles and capillaries in diseases classified elsewhere: Secondary | ICD-10-CM | POA: Diagnosis not present

## 2013-07-11 DIAGNOSIS — E1159 Type 2 diabetes mellitus with other circulatory complications: Secondary | ICD-10-CM | POA: Diagnosis not present

## 2013-07-16 DIAGNOSIS — L8992 Pressure ulcer of unspecified site, stage 2: Secondary | ICD-10-CM | POA: Diagnosis not present

## 2013-07-16 DIAGNOSIS — G9050 Complex regional pain syndrome I, unspecified: Secondary | ICD-10-CM | POA: Diagnosis not present

## 2013-07-16 DIAGNOSIS — E1159 Type 2 diabetes mellitus with other circulatory complications: Secondary | ICD-10-CM | POA: Diagnosis not present

## 2013-07-16 DIAGNOSIS — I798 Other disorders of arteries, arterioles and capillaries in diseases classified elsewhere: Secondary | ICD-10-CM | POA: Diagnosis not present

## 2013-07-16 DIAGNOSIS — J449 Chronic obstructive pulmonary disease, unspecified: Secondary | ICD-10-CM | POA: Diagnosis not present

## 2013-07-16 DIAGNOSIS — L89309 Pressure ulcer of unspecified buttock, unspecified stage: Secondary | ICD-10-CM | POA: Diagnosis not present

## 2013-07-18 DIAGNOSIS — E1159 Type 2 diabetes mellitus with other circulatory complications: Secondary | ICD-10-CM | POA: Diagnosis not present

## 2013-07-18 DIAGNOSIS — G9050 Complex regional pain syndrome I, unspecified: Secondary | ICD-10-CM | POA: Diagnosis not present

## 2013-07-18 DIAGNOSIS — L89309 Pressure ulcer of unspecified buttock, unspecified stage: Secondary | ICD-10-CM | POA: Diagnosis not present

## 2013-07-18 DIAGNOSIS — L8992 Pressure ulcer of unspecified site, stage 2: Secondary | ICD-10-CM | POA: Diagnosis not present

## 2013-07-18 DIAGNOSIS — J449 Chronic obstructive pulmonary disease, unspecified: Secondary | ICD-10-CM | POA: Diagnosis not present

## 2013-07-18 DIAGNOSIS — I798 Other disorders of arteries, arterioles and capillaries in diseases classified elsewhere: Secondary | ICD-10-CM | POA: Diagnosis not present

## 2013-07-21 DIAGNOSIS — J449 Chronic obstructive pulmonary disease, unspecified: Secondary | ICD-10-CM | POA: Diagnosis not present

## 2013-07-21 DIAGNOSIS — R21 Rash and other nonspecific skin eruption: Secondary | ICD-10-CM | POA: Diagnosis present

## 2013-07-21 DIAGNOSIS — A419 Sepsis, unspecified organism: Secondary | ICD-10-CM | POA: Diagnosis not present

## 2013-07-21 DIAGNOSIS — G9050 Complex regional pain syndrome I, unspecified: Secondary | ICD-10-CM | POA: Diagnosis not present

## 2013-07-21 DIAGNOSIS — G9341 Metabolic encephalopathy: Secondary | ICD-10-CM | POA: Diagnosis not present

## 2013-07-21 DIAGNOSIS — E871 Hypo-osmolality and hyponatremia: Secondary | ICD-10-CM | POA: Diagnosis not present

## 2013-07-21 DIAGNOSIS — I6789 Other cerebrovascular disease: Secondary | ICD-10-CM | POA: Diagnosis not present

## 2013-07-21 DIAGNOSIS — E1159 Type 2 diabetes mellitus with other circulatory complications: Secondary | ICD-10-CM | POA: Diagnosis not present

## 2013-07-21 DIAGNOSIS — L8993 Pressure ulcer of unspecified site, stage 3: Secondary | ICD-10-CM | POA: Diagnosis not present

## 2013-07-21 DIAGNOSIS — R4182 Altered mental status, unspecified: Secondary | ICD-10-CM | POA: Diagnosis not present

## 2013-07-21 DIAGNOSIS — D649 Anemia, unspecified: Secondary | ICD-10-CM | POA: Diagnosis present

## 2013-07-21 DIAGNOSIS — R6889 Other general symptoms and signs: Secondary | ICD-10-CM | POA: Diagnosis not present

## 2013-07-21 DIAGNOSIS — I69992 Facial weakness following unspecified cerebrovascular disease: Secondary | ICD-10-CM | POA: Diagnosis not present

## 2013-07-21 DIAGNOSIS — Z1639 Resistance to other specified antimicrobial drug: Secondary | ICD-10-CM | POA: Diagnosis present

## 2013-07-21 DIAGNOSIS — Z452 Encounter for adjustment and management of vascular access device: Secondary | ICD-10-CM | POA: Diagnosis not present

## 2013-07-21 DIAGNOSIS — E860 Dehydration: Secondary | ICD-10-CM | POA: Diagnosis not present

## 2013-07-21 DIAGNOSIS — E872 Acidosis: Secondary | ICD-10-CM | POA: Diagnosis not present

## 2013-07-21 DIAGNOSIS — E119 Type 2 diabetes mellitus without complications: Secondary | ICD-10-CM | POA: Diagnosis not present

## 2013-07-21 DIAGNOSIS — I10 Essential (primary) hypertension: Secondary | ICD-10-CM | POA: Diagnosis not present

## 2013-07-21 DIAGNOSIS — I959 Hypotension, unspecified: Secondary | ICD-10-CM | POA: Diagnosis not present

## 2013-07-21 DIAGNOSIS — I69959 Hemiplegia and hemiparesis following unspecified cerebrovascular disease affecting unspecified side: Secondary | ICD-10-CM | POA: Diagnosis not present

## 2013-07-21 DIAGNOSIS — I2510 Atherosclerotic heart disease of native coronary artery without angina pectoris: Secondary | ICD-10-CM | POA: Diagnosis present

## 2013-07-21 DIAGNOSIS — R7309 Other abnormal glucose: Secondary | ICD-10-CM | POA: Diagnosis not present

## 2013-07-21 DIAGNOSIS — L0291 Cutaneous abscess, unspecified: Secondary | ICD-10-CM | POA: Diagnosis not present

## 2013-07-21 DIAGNOSIS — L0231 Cutaneous abscess of buttock: Secondary | ICD-10-CM | POA: Diagnosis present

## 2013-07-21 DIAGNOSIS — J4489 Other specified chronic obstructive pulmonary disease: Secondary | ICD-10-CM | POA: Diagnosis not present

## 2013-07-21 DIAGNOSIS — I798 Other disorders of arteries, arterioles and capillaries in diseases classified elsewhere: Secondary | ICD-10-CM | POA: Diagnosis not present

## 2013-07-21 DIAGNOSIS — L8992 Pressure ulcer of unspecified site, stage 2: Secondary | ICD-10-CM | POA: Diagnosis not present

## 2013-07-21 DIAGNOSIS — Z993 Dependence on wheelchair: Secondary | ICD-10-CM | POA: Diagnosis not present

## 2013-07-21 DIAGNOSIS — R51 Headache: Secondary | ICD-10-CM | POA: Diagnosis not present

## 2013-07-21 DIAGNOSIS — L89309 Pressure ulcer of unspecified buttock, unspecified stage: Secondary | ICD-10-CM | POA: Diagnosis not present

## 2013-07-21 DIAGNOSIS — R0602 Shortness of breath: Secondary | ICD-10-CM | POA: Diagnosis not present

## 2013-07-21 DIAGNOSIS — Z2239 Carrier of other specified bacterial diseases: Secondary | ICD-10-CM | POA: Diagnosis not present

## 2013-07-22 DIAGNOSIS — G8114 Spastic hemiplegia affecting left nondominant side: Secondary | ICD-10-CM | POA: Insufficient documentation

## 2013-07-22 DIAGNOSIS — A419 Sepsis, unspecified organism: Secondary | ICD-10-CM | POA: Insufficient documentation

## 2013-07-22 DIAGNOSIS — Z22338 Carrier of other streptococcus: Secondary | ICD-10-CM | POA: Insufficient documentation

## 2013-07-22 DIAGNOSIS — R21 Rash and other nonspecific skin eruption: Secondary | ICD-10-CM | POA: Insufficient documentation

## 2013-07-23 DIAGNOSIS — L0390 Cellulitis, unspecified: Secondary | ICD-10-CM | POA: Insufficient documentation

## 2013-07-23 DIAGNOSIS — I10 Essential (primary) hypertension: Secondary | ICD-10-CM | POA: Insufficient documentation

## 2013-07-23 DIAGNOSIS — L89309 Pressure ulcer of unspecified buttock, unspecified stage: Secondary | ICD-10-CM | POA: Insufficient documentation

## 2013-07-23 DIAGNOSIS — E119 Type 2 diabetes mellitus without complications: Secondary | ICD-10-CM | POA: Insufficient documentation

## 2013-07-28 DIAGNOSIS — L8992 Pressure ulcer of unspecified site, stage 2: Secondary | ICD-10-CM | POA: Diagnosis not present

## 2013-07-28 DIAGNOSIS — J449 Chronic obstructive pulmonary disease, unspecified: Secondary | ICD-10-CM | POA: Diagnosis not present

## 2013-07-28 DIAGNOSIS — L89309 Pressure ulcer of unspecified buttock, unspecified stage: Secondary | ICD-10-CM | POA: Diagnosis not present

## 2013-07-28 DIAGNOSIS — E1159 Type 2 diabetes mellitus with other circulatory complications: Secondary | ICD-10-CM | POA: Diagnosis not present

## 2013-07-28 DIAGNOSIS — I798 Other disorders of arteries, arterioles and capillaries in diseases classified elsewhere: Secondary | ICD-10-CM | POA: Diagnosis not present

## 2013-07-28 DIAGNOSIS — G9050 Complex regional pain syndrome I, unspecified: Secondary | ICD-10-CM | POA: Diagnosis not present

## 2013-07-29 DIAGNOSIS — I798 Other disorders of arteries, arterioles and capillaries in diseases classified elsewhere: Secondary | ICD-10-CM | POA: Diagnosis not present

## 2013-07-29 DIAGNOSIS — L8992 Pressure ulcer of unspecified site, stage 2: Secondary | ICD-10-CM | POA: Diagnosis not present

## 2013-07-29 DIAGNOSIS — J449 Chronic obstructive pulmonary disease, unspecified: Secondary | ICD-10-CM | POA: Diagnosis not present

## 2013-07-29 DIAGNOSIS — G9050 Complex regional pain syndrome I, unspecified: Secondary | ICD-10-CM | POA: Diagnosis not present

## 2013-07-29 DIAGNOSIS — L89309 Pressure ulcer of unspecified buttock, unspecified stage: Secondary | ICD-10-CM | POA: Diagnosis not present

## 2013-07-29 DIAGNOSIS — E1159 Type 2 diabetes mellitus with other circulatory complications: Secondary | ICD-10-CM | POA: Diagnosis not present

## 2013-07-31 DIAGNOSIS — J449 Chronic obstructive pulmonary disease, unspecified: Secondary | ICD-10-CM | POA: Diagnosis not present

## 2013-07-31 DIAGNOSIS — G9050 Complex regional pain syndrome I, unspecified: Secondary | ICD-10-CM | POA: Diagnosis not present

## 2013-07-31 DIAGNOSIS — I798 Other disorders of arteries, arterioles and capillaries in diseases classified elsewhere: Secondary | ICD-10-CM | POA: Diagnosis not present

## 2013-07-31 DIAGNOSIS — L89309 Pressure ulcer of unspecified buttock, unspecified stage: Secondary | ICD-10-CM | POA: Diagnosis not present

## 2013-07-31 DIAGNOSIS — L8992 Pressure ulcer of unspecified site, stage 2: Secondary | ICD-10-CM | POA: Diagnosis not present

## 2013-07-31 DIAGNOSIS — E1159 Type 2 diabetes mellitus with other circulatory complications: Secondary | ICD-10-CM | POA: Diagnosis not present

## 2013-08-01 DIAGNOSIS — J449 Chronic obstructive pulmonary disease, unspecified: Secondary | ICD-10-CM | POA: Diagnosis not present

## 2013-08-01 DIAGNOSIS — I798 Other disorders of arteries, arterioles and capillaries in diseases classified elsewhere: Secondary | ICD-10-CM | POA: Diagnosis not present

## 2013-08-01 DIAGNOSIS — G9050 Complex regional pain syndrome I, unspecified: Secondary | ICD-10-CM | POA: Diagnosis not present

## 2013-08-01 DIAGNOSIS — L89309 Pressure ulcer of unspecified buttock, unspecified stage: Secondary | ICD-10-CM | POA: Diagnosis not present

## 2013-08-01 DIAGNOSIS — L8992 Pressure ulcer of unspecified site, stage 2: Secondary | ICD-10-CM | POA: Diagnosis not present

## 2013-08-01 DIAGNOSIS — E1159 Type 2 diabetes mellitus with other circulatory complications: Secondary | ICD-10-CM | POA: Diagnosis not present

## 2013-08-04 DIAGNOSIS — E1159 Type 2 diabetes mellitus with other circulatory complications: Secondary | ICD-10-CM | POA: Diagnosis not present

## 2013-08-04 DIAGNOSIS — G9050 Complex regional pain syndrome I, unspecified: Secondary | ICD-10-CM | POA: Diagnosis not present

## 2013-08-04 DIAGNOSIS — I798 Other disorders of arteries, arterioles and capillaries in diseases classified elsewhere: Secondary | ICD-10-CM | POA: Diagnosis not present

## 2013-08-04 DIAGNOSIS — L89309 Pressure ulcer of unspecified buttock, unspecified stage: Secondary | ICD-10-CM | POA: Diagnosis not present

## 2013-08-04 DIAGNOSIS — J449 Chronic obstructive pulmonary disease, unspecified: Secondary | ICD-10-CM | POA: Diagnosis not present

## 2013-08-04 DIAGNOSIS — L8992 Pressure ulcer of unspecified site, stage 2: Secondary | ICD-10-CM | POA: Diagnosis not present

## 2013-08-05 DIAGNOSIS — Z7982 Long term (current) use of aspirin: Secondary | ICD-10-CM | POA: Diagnosis not present

## 2013-08-05 DIAGNOSIS — F17200 Nicotine dependence, unspecified, uncomplicated: Secondary | ICD-10-CM | POA: Diagnosis present

## 2013-08-05 DIAGNOSIS — R079 Chest pain, unspecified: Secondary | ICD-10-CM | POA: Diagnosis not present

## 2013-08-05 DIAGNOSIS — L8992 Pressure ulcer of unspecified site, stage 2: Secondary | ICD-10-CM | POA: Diagnosis not present

## 2013-08-05 DIAGNOSIS — G4733 Obstructive sleep apnea (adult) (pediatric): Secondary | ICD-10-CM | POA: Diagnosis present

## 2013-08-05 DIAGNOSIS — Z79899 Other long term (current) drug therapy: Secondary | ICD-10-CM | POA: Diagnosis not present

## 2013-08-05 DIAGNOSIS — Z66 Do not resuscitate: Secondary | ICD-10-CM | POA: Diagnosis present

## 2013-08-05 DIAGNOSIS — Z9089 Acquired absence of other organs: Secondary | ICD-10-CM | POA: Diagnosis not present

## 2013-08-05 DIAGNOSIS — T400X1A Poisoning by opium, accidental (unintentional), initial encounter: Secondary | ICD-10-CM | POA: Diagnosis not present

## 2013-08-05 DIAGNOSIS — R4182 Altered mental status, unspecified: Secondary | ICD-10-CM | POA: Diagnosis not present

## 2013-08-05 DIAGNOSIS — L8993 Pressure ulcer of unspecified site, stage 3: Secondary | ICD-10-CM | POA: Insufficient documentation

## 2013-08-05 DIAGNOSIS — L89309 Pressure ulcer of unspecified buttock, unspecified stage: Secondary | ICD-10-CM | POA: Diagnosis present

## 2013-08-05 DIAGNOSIS — F329 Major depressive disorder, single episode, unspecified: Secondary | ICD-10-CM | POA: Diagnosis present

## 2013-08-05 DIAGNOSIS — E119 Type 2 diabetes mellitus without complications: Secondary | ICD-10-CM | POA: Diagnosis present

## 2013-08-05 DIAGNOSIS — Z8673 Personal history of transient ischemic attack (TIA), and cerebral infarction without residual deficits: Secondary | ICD-10-CM | POA: Diagnosis not present

## 2013-08-05 DIAGNOSIS — I69959 Hemiplegia and hemiparesis following unspecified cerebrovascular disease affecting unspecified side: Secondary | ICD-10-CM | POA: Diagnosis not present

## 2013-08-05 DIAGNOSIS — R5381 Other malaise: Secondary | ICD-10-CM | POA: Diagnosis present

## 2013-08-05 DIAGNOSIS — I6789 Other cerebrovascular disease: Secondary | ICD-10-CM | POA: Diagnosis not present

## 2013-08-05 DIAGNOSIS — T50901A Poisoning by unspecified drugs, medicaments and biological substances, accidental (unintentional), initial encounter: Secondary | ICD-10-CM | POA: Diagnosis not present

## 2013-08-05 DIAGNOSIS — M6281 Muscle weakness (generalized): Secondary | ICD-10-CM | POA: Diagnosis not present

## 2013-08-05 DIAGNOSIS — G8929 Other chronic pain: Secondary | ICD-10-CM | POA: Diagnosis not present

## 2013-08-05 DIAGNOSIS — J4489 Other specified chronic obstructive pulmonary disease: Secondary | ICD-10-CM | POA: Diagnosis present

## 2013-08-05 DIAGNOSIS — J449 Chronic obstructive pulmonary disease, unspecified: Secondary | ICD-10-CM | POA: Diagnosis not present

## 2013-08-05 DIAGNOSIS — R21 Rash and other nonspecific skin eruption: Secondary | ICD-10-CM | POA: Diagnosis not present

## 2013-08-05 DIAGNOSIS — I959 Hypotension, unspecified: Secondary | ICD-10-CM | POA: Diagnosis not present

## 2013-08-05 DIAGNOSIS — G9050 Complex regional pain syndrome I, unspecified: Secondary | ICD-10-CM | POA: Diagnosis present

## 2013-08-05 DIAGNOSIS — I798 Other disorders of arteries, arterioles and capillaries in diseases classified elsewhere: Secondary | ICD-10-CM | POA: Diagnosis not present

## 2013-08-05 DIAGNOSIS — A419 Sepsis, unspecified organism: Secondary | ICD-10-CM | POA: Diagnosis not present

## 2013-08-05 DIAGNOSIS — I498 Other specified cardiac arrhythmias: Secondary | ICD-10-CM | POA: Diagnosis not present

## 2013-08-05 DIAGNOSIS — L8990 Pressure ulcer of unspecified site, unspecified stage: Secondary | ICD-10-CM | POA: Diagnosis present

## 2013-08-05 DIAGNOSIS — E876 Hypokalemia: Secondary | ICD-10-CM | POA: Diagnosis present

## 2013-08-05 DIAGNOSIS — L0291 Cutaneous abscess, unspecified: Secondary | ICD-10-CM | POA: Diagnosis not present

## 2013-08-05 DIAGNOSIS — F05 Delirium due to known physiological condition: Secondary | ICD-10-CM | POA: Insufficient documentation

## 2013-08-05 DIAGNOSIS — L89609 Pressure ulcer of unspecified heel, unspecified stage: Secondary | ICD-10-CM | POA: Diagnosis not present

## 2013-08-05 DIAGNOSIS — E1159 Type 2 diabetes mellitus with other circulatory complications: Secondary | ICD-10-CM | POA: Diagnosis not present

## 2013-08-05 DIAGNOSIS — G9340 Encephalopathy, unspecified: Secondary | ICD-10-CM | POA: Diagnosis not present

## 2013-08-05 DIAGNOSIS — I10 Essential (primary) hypertension: Secondary | ICD-10-CM | POA: Diagnosis not present

## 2013-08-05 DIAGNOSIS — L97409 Non-pressure chronic ulcer of unspecified heel and midfoot with unspecified severity: Secondary | ICD-10-CM | POA: Diagnosis not present

## 2013-08-06 DIAGNOSIS — T50901A Poisoning by unspecified drugs, medicaments and biological substances, accidental (unintentional), initial encounter: Secondary | ICD-10-CM | POA: Diagnosis not present

## 2013-08-06 DIAGNOSIS — G9340 Encephalopathy, unspecified: Secondary | ICD-10-CM | POA: Diagnosis not present

## 2013-08-06 DIAGNOSIS — I69959 Hemiplegia and hemiparesis following unspecified cerebrovascular disease affecting unspecified side: Secondary | ICD-10-CM | POA: Diagnosis not present

## 2013-08-06 DIAGNOSIS — L97409 Non-pressure chronic ulcer of unspecified heel and midfoot with unspecified severity: Secondary | ICD-10-CM | POA: Diagnosis not present

## 2013-08-06 DIAGNOSIS — I959 Hypotension, unspecified: Secondary | ICD-10-CM | POA: Diagnosis not present

## 2013-08-06 DIAGNOSIS — E119 Type 2 diabetes mellitus without complications: Secondary | ICD-10-CM | POA: Diagnosis not present

## 2013-08-06 DIAGNOSIS — L0291 Cutaneous abscess, unspecified: Secondary | ICD-10-CM | POA: Diagnosis not present

## 2013-08-06 DIAGNOSIS — I10 Essential (primary) hypertension: Secondary | ICD-10-CM | POA: Diagnosis not present

## 2013-08-06 DIAGNOSIS — E876 Hypokalemia: Secondary | ICD-10-CM | POA: Insufficient documentation

## 2013-08-06 DIAGNOSIS — R21 Rash and other nonspecific skin eruption: Secondary | ICD-10-CM | POA: Diagnosis not present

## 2013-08-07 DIAGNOSIS — R21 Rash and other nonspecific skin eruption: Secondary | ICD-10-CM | POA: Diagnosis not present

## 2013-08-07 DIAGNOSIS — L0291 Cutaneous abscess, unspecified: Secondary | ICD-10-CM | POA: Diagnosis not present

## 2013-08-07 DIAGNOSIS — I959 Hypotension, unspecified: Secondary | ICD-10-CM | POA: Diagnosis not present

## 2013-08-07 DIAGNOSIS — T50901A Poisoning by unspecified drugs, medicaments and biological substances, accidental (unintentional), initial encounter: Secondary | ICD-10-CM | POA: Diagnosis not present

## 2013-08-07 DIAGNOSIS — I69959 Hemiplegia and hemiparesis following unspecified cerebrovascular disease affecting unspecified side: Secondary | ICD-10-CM | POA: Diagnosis not present

## 2013-08-07 DIAGNOSIS — I10 Essential (primary) hypertension: Secondary | ICD-10-CM | POA: Diagnosis not present

## 2013-08-07 DIAGNOSIS — G9340 Encephalopathy, unspecified: Secondary | ICD-10-CM | POA: Diagnosis not present

## 2013-08-07 DIAGNOSIS — E119 Type 2 diabetes mellitus without complications: Secondary | ICD-10-CM | POA: Diagnosis not present

## 2013-08-08 DIAGNOSIS — L0291 Cutaneous abscess, unspecified: Secondary | ICD-10-CM | POA: Diagnosis not present

## 2013-08-08 DIAGNOSIS — I69959 Hemiplegia and hemiparesis following unspecified cerebrovascular disease affecting unspecified side: Secondary | ICD-10-CM | POA: Diagnosis not present

## 2013-08-08 DIAGNOSIS — E119 Type 2 diabetes mellitus without complications: Secondary | ICD-10-CM | POA: Diagnosis not present

## 2013-08-08 DIAGNOSIS — G9340 Encephalopathy, unspecified: Secondary | ICD-10-CM | POA: Diagnosis not present

## 2013-08-08 DIAGNOSIS — T50901A Poisoning by unspecified drugs, medicaments and biological substances, accidental (unintentional), initial encounter: Secondary | ICD-10-CM | POA: Diagnosis not present

## 2013-08-08 DIAGNOSIS — I10 Essential (primary) hypertension: Secondary | ICD-10-CM | POA: Diagnosis not present

## 2013-08-08 DIAGNOSIS — R21 Rash and other nonspecific skin eruption: Secondary | ICD-10-CM | POA: Diagnosis not present

## 2013-08-08 DIAGNOSIS — I959 Hypotension, unspecified: Secondary | ICD-10-CM | POA: Diagnosis not present

## 2013-08-09 DIAGNOSIS — G9340 Encephalopathy, unspecified: Secondary | ICD-10-CM | POA: Diagnosis not present

## 2013-08-09 DIAGNOSIS — T50901A Poisoning by unspecified drugs, medicaments and biological substances, accidental (unintentional), initial encounter: Secondary | ICD-10-CM | POA: Diagnosis not present

## 2013-08-09 DIAGNOSIS — I69959 Hemiplegia and hemiparesis following unspecified cerebrovascular disease affecting unspecified side: Secondary | ICD-10-CM | POA: Diagnosis not present

## 2013-08-09 DIAGNOSIS — E119 Type 2 diabetes mellitus without complications: Secondary | ICD-10-CM | POA: Diagnosis not present

## 2013-08-09 DIAGNOSIS — L0291 Cutaneous abscess, unspecified: Secondary | ICD-10-CM | POA: Diagnosis not present

## 2013-08-09 DIAGNOSIS — R21 Rash and other nonspecific skin eruption: Secondary | ICD-10-CM | POA: Diagnosis not present

## 2013-08-09 DIAGNOSIS — I10 Essential (primary) hypertension: Secondary | ICD-10-CM | POA: Diagnosis not present

## 2013-08-09 DIAGNOSIS — I959 Hypotension, unspecified: Secondary | ICD-10-CM | POA: Diagnosis not present

## 2013-08-10 DIAGNOSIS — I10 Essential (primary) hypertension: Secondary | ICD-10-CM | POA: Diagnosis not present

## 2013-08-10 DIAGNOSIS — L0291 Cutaneous abscess, unspecified: Secondary | ICD-10-CM | POA: Diagnosis not present

## 2013-08-10 DIAGNOSIS — I959 Hypotension, unspecified: Secondary | ICD-10-CM | POA: Diagnosis not present

## 2013-08-10 DIAGNOSIS — T50901A Poisoning by unspecified drugs, medicaments and biological substances, accidental (unintentional), initial encounter: Secondary | ICD-10-CM | POA: Diagnosis not present

## 2013-08-10 DIAGNOSIS — I69959 Hemiplegia and hemiparesis following unspecified cerebrovascular disease affecting unspecified side: Secondary | ICD-10-CM | POA: Diagnosis not present

## 2013-08-10 DIAGNOSIS — R21 Rash and other nonspecific skin eruption: Secondary | ICD-10-CM | POA: Diagnosis not present

## 2013-08-10 DIAGNOSIS — E119 Type 2 diabetes mellitus without complications: Secondary | ICD-10-CM | POA: Diagnosis not present

## 2013-08-10 DIAGNOSIS — G9340 Encephalopathy, unspecified: Secondary | ICD-10-CM | POA: Diagnosis not present

## 2013-08-11 DIAGNOSIS — G9340 Encephalopathy, unspecified: Secondary | ICD-10-CM | POA: Diagnosis not present

## 2013-08-11 DIAGNOSIS — T50901A Poisoning by unspecified drugs, medicaments and biological substances, accidental (unintentional), initial encounter: Secondary | ICD-10-CM | POA: Diagnosis not present

## 2013-08-11 DIAGNOSIS — G8929 Other chronic pain: Secondary | ICD-10-CM | POA: Diagnosis not present

## 2013-08-11 DIAGNOSIS — E119 Type 2 diabetes mellitus without complications: Secondary | ICD-10-CM | POA: Diagnosis not present

## 2013-08-11 DIAGNOSIS — M6281 Muscle weakness (generalized): Secondary | ICD-10-CM | POA: Diagnosis not present

## 2013-08-12 DIAGNOSIS — I798 Other disorders of arteries, arterioles and capillaries in diseases classified elsewhere: Secondary | ICD-10-CM | POA: Diagnosis not present

## 2013-08-12 DIAGNOSIS — G9050 Complex regional pain syndrome I, unspecified: Secondary | ICD-10-CM | POA: Diagnosis not present

## 2013-08-12 DIAGNOSIS — E1159 Type 2 diabetes mellitus with other circulatory complications: Secondary | ICD-10-CM | POA: Diagnosis not present

## 2013-08-12 DIAGNOSIS — L8992 Pressure ulcer of unspecified site, stage 2: Secondary | ICD-10-CM | POA: Diagnosis not present

## 2013-08-12 DIAGNOSIS — J449 Chronic obstructive pulmonary disease, unspecified: Secondary | ICD-10-CM | POA: Diagnosis not present

## 2013-08-12 DIAGNOSIS — L89309 Pressure ulcer of unspecified buttock, unspecified stage: Secondary | ICD-10-CM | POA: Diagnosis not present

## 2013-08-14 DIAGNOSIS — G9050 Complex regional pain syndrome I, unspecified: Secondary | ICD-10-CM | POA: Diagnosis not present

## 2013-08-14 DIAGNOSIS — E7800 Pure hypercholesterolemia, unspecified: Secondary | ICD-10-CM | POA: Diagnosis not present

## 2013-08-14 DIAGNOSIS — E119 Type 2 diabetes mellitus without complications: Secondary | ICD-10-CM | POA: Diagnosis not present

## 2013-08-14 DIAGNOSIS — I6990 Unspecified sequelae of unspecified cerebrovascular disease: Secondary | ICD-10-CM | POA: Diagnosis not present

## 2013-08-14 DIAGNOSIS — G4700 Insomnia, unspecified: Secondary | ICD-10-CM | POA: Diagnosis not present

## 2013-08-16 DIAGNOSIS — I10 Essential (primary) hypertension: Secondary | ICD-10-CM | POA: Diagnosis not present

## 2013-08-16 DIAGNOSIS — Z794 Long term (current) use of insulin: Secondary | ICD-10-CM | POA: Diagnosis not present

## 2013-08-16 DIAGNOSIS — I2510 Atherosclerotic heart disease of native coronary artery without angina pectoris: Secondary | ICD-10-CM | POA: Diagnosis not present

## 2013-08-16 DIAGNOSIS — G9050 Complex regional pain syndrome I, unspecified: Secondary | ICD-10-CM | POA: Diagnosis not present

## 2013-08-16 DIAGNOSIS — Z9981 Dependence on supplemental oxygen: Secondary | ICD-10-CM | POA: Diagnosis not present

## 2013-08-16 DIAGNOSIS — L8993 Pressure ulcer of unspecified site, stage 3: Secondary | ICD-10-CM | POA: Diagnosis not present

## 2013-08-16 DIAGNOSIS — F329 Major depressive disorder, single episode, unspecified: Secondary | ICD-10-CM | POA: Diagnosis not present

## 2013-08-16 DIAGNOSIS — L89609 Pressure ulcer of unspecified heel, unspecified stage: Secondary | ICD-10-CM | POA: Diagnosis not present

## 2013-08-16 DIAGNOSIS — Z9181 History of falling: Secondary | ICD-10-CM | POA: Diagnosis not present

## 2013-08-16 DIAGNOSIS — I69959 Hemiplegia and hemiparesis following unspecified cerebrovascular disease affecting unspecified side: Secondary | ICD-10-CM | POA: Diagnosis not present

## 2013-08-16 DIAGNOSIS — J449 Chronic obstructive pulmonary disease, unspecified: Secondary | ICD-10-CM | POA: Diagnosis not present

## 2013-08-16 DIAGNOSIS — I498 Other specified cardiac arrhythmias: Secondary | ICD-10-CM | POA: Diagnosis not present

## 2013-08-16 DIAGNOSIS — D649 Anemia, unspecified: Secondary | ICD-10-CM | POA: Diagnosis not present

## 2013-08-16 DIAGNOSIS — E1159 Type 2 diabetes mellitus with other circulatory complications: Secondary | ICD-10-CM | POA: Diagnosis not present

## 2013-08-16 DIAGNOSIS — I798 Other disorders of arteries, arterioles and capillaries in diseases classified elsewhere: Secondary | ICD-10-CM | POA: Diagnosis not present

## 2013-08-21 DIAGNOSIS — L89609 Pressure ulcer of unspecified heel, unspecified stage: Secondary | ICD-10-CM | POA: Diagnosis not present

## 2013-08-21 DIAGNOSIS — L8993 Pressure ulcer of unspecified site, stage 3: Secondary | ICD-10-CM | POA: Diagnosis not present

## 2013-08-21 DIAGNOSIS — E1159 Type 2 diabetes mellitus with other circulatory complications: Secondary | ICD-10-CM | POA: Diagnosis not present

## 2013-08-21 DIAGNOSIS — J449 Chronic obstructive pulmonary disease, unspecified: Secondary | ICD-10-CM | POA: Diagnosis not present

## 2013-08-21 DIAGNOSIS — I10 Essential (primary) hypertension: Secondary | ICD-10-CM | POA: Diagnosis not present

## 2013-08-21 DIAGNOSIS — I798 Other disorders of arteries, arterioles and capillaries in diseases classified elsewhere: Secondary | ICD-10-CM | POA: Diagnosis not present

## 2013-08-22 DIAGNOSIS — E1159 Type 2 diabetes mellitus with other circulatory complications: Secondary | ICD-10-CM | POA: Diagnosis not present

## 2013-08-22 DIAGNOSIS — I10 Essential (primary) hypertension: Secondary | ICD-10-CM | POA: Diagnosis not present

## 2013-08-22 DIAGNOSIS — L89609 Pressure ulcer of unspecified heel, unspecified stage: Secondary | ICD-10-CM | POA: Diagnosis not present

## 2013-08-22 DIAGNOSIS — L8993 Pressure ulcer of unspecified site, stage 3: Secondary | ICD-10-CM | POA: Diagnosis not present

## 2013-08-22 DIAGNOSIS — I798 Other disorders of arteries, arterioles and capillaries in diseases classified elsewhere: Secondary | ICD-10-CM | POA: Diagnosis not present

## 2013-08-22 DIAGNOSIS — J449 Chronic obstructive pulmonary disease, unspecified: Secondary | ICD-10-CM | POA: Diagnosis not present

## 2013-08-26 DIAGNOSIS — J449 Chronic obstructive pulmonary disease, unspecified: Secondary | ICD-10-CM | POA: Diagnosis not present

## 2013-08-26 DIAGNOSIS — I798 Other disorders of arteries, arterioles and capillaries in diseases classified elsewhere: Secondary | ICD-10-CM | POA: Diagnosis not present

## 2013-08-26 DIAGNOSIS — I10 Essential (primary) hypertension: Secondary | ICD-10-CM | POA: Diagnosis not present

## 2013-08-26 DIAGNOSIS — E1159 Type 2 diabetes mellitus with other circulatory complications: Secondary | ICD-10-CM | POA: Diagnosis not present

## 2013-08-26 DIAGNOSIS — L89609 Pressure ulcer of unspecified heel, unspecified stage: Secondary | ICD-10-CM | POA: Diagnosis not present

## 2013-08-26 DIAGNOSIS — L8993 Pressure ulcer of unspecified site, stage 3: Secondary | ICD-10-CM | POA: Diagnosis not present

## 2013-08-27 DIAGNOSIS — F329 Major depressive disorder, single episode, unspecified: Secondary | ICD-10-CM | POA: Diagnosis not present

## 2013-08-27 DIAGNOSIS — Z7982 Long term (current) use of aspirin: Secondary | ICD-10-CM | POA: Diagnosis not present

## 2013-08-27 DIAGNOSIS — Z8673 Personal history of transient ischemic attack (TIA), and cerebral infarction without residual deficits: Secondary | ICD-10-CM | POA: Diagnosis not present

## 2013-08-27 DIAGNOSIS — R195 Other fecal abnormalities: Secondary | ICD-10-CM | POA: Diagnosis not present

## 2013-08-27 DIAGNOSIS — Z9981 Dependence on supplemental oxygen: Secondary | ICD-10-CM | POA: Diagnosis not present

## 2013-08-27 DIAGNOSIS — Z884 Allergy status to anesthetic agent status: Secondary | ICD-10-CM | POA: Diagnosis not present

## 2013-08-27 DIAGNOSIS — R109 Unspecified abdominal pain: Secondary | ICD-10-CM | POA: Diagnosis not present

## 2013-08-27 DIAGNOSIS — J449 Chronic obstructive pulmonary disease, unspecified: Secondary | ICD-10-CM | POA: Diagnosis not present

## 2013-08-27 DIAGNOSIS — K5289 Other specified noninfective gastroenteritis and colitis: Secondary | ICD-10-CM | POA: Diagnosis not present

## 2013-08-27 DIAGNOSIS — Z888 Allergy status to other drugs, medicaments and biological substances status: Secondary | ICD-10-CM | POA: Diagnosis not present

## 2013-08-27 DIAGNOSIS — Z794 Long term (current) use of insulin: Secondary | ICD-10-CM | POA: Diagnosis not present

## 2013-08-27 DIAGNOSIS — Z885 Allergy status to narcotic agent status: Secondary | ICD-10-CM | POA: Diagnosis not present

## 2013-08-27 DIAGNOSIS — E119 Type 2 diabetes mellitus without complications: Secondary | ICD-10-CM | POA: Diagnosis not present

## 2013-08-27 DIAGNOSIS — R6889 Other general symptoms and signs: Secondary | ICD-10-CM | POA: Diagnosis not present

## 2013-08-27 DIAGNOSIS — K625 Hemorrhage of anus and rectum: Secondary | ICD-10-CM | POA: Diagnosis not present

## 2013-08-27 DIAGNOSIS — I10 Essential (primary) hypertension: Secondary | ICD-10-CM | POA: Diagnosis not present

## 2013-08-27 DIAGNOSIS — Z79899 Other long term (current) drug therapy: Secondary | ICD-10-CM | POA: Diagnosis not present

## 2013-08-27 DIAGNOSIS — R1084 Generalized abdominal pain: Secondary | ICD-10-CM | POA: Diagnosis not present

## 2013-08-27 DIAGNOSIS — F17200 Nicotine dependence, unspecified, uncomplicated: Secondary | ICD-10-CM | POA: Diagnosis not present

## 2013-08-27 DIAGNOSIS — K5900 Constipation, unspecified: Secondary | ICD-10-CM | POA: Diagnosis not present

## 2013-08-28 DIAGNOSIS — I798 Other disorders of arteries, arterioles and capillaries in diseases classified elsewhere: Secondary | ICD-10-CM | POA: Diagnosis not present

## 2013-08-28 DIAGNOSIS — I10 Essential (primary) hypertension: Secondary | ICD-10-CM | POA: Diagnosis not present

## 2013-08-28 DIAGNOSIS — L8993 Pressure ulcer of unspecified site, stage 3: Secondary | ICD-10-CM | POA: Diagnosis not present

## 2013-08-28 DIAGNOSIS — E1159 Type 2 diabetes mellitus with other circulatory complications: Secondary | ICD-10-CM | POA: Diagnosis not present

## 2013-08-28 DIAGNOSIS — L89609 Pressure ulcer of unspecified heel, unspecified stage: Secondary | ICD-10-CM | POA: Diagnosis not present

## 2013-08-28 DIAGNOSIS — J449 Chronic obstructive pulmonary disease, unspecified: Secondary | ICD-10-CM | POA: Diagnosis not present

## 2013-09-01 DIAGNOSIS — E1159 Type 2 diabetes mellitus with other circulatory complications: Secondary | ICD-10-CM | POA: Diagnosis not present

## 2013-09-01 DIAGNOSIS — J449 Chronic obstructive pulmonary disease, unspecified: Secondary | ICD-10-CM | POA: Diagnosis not present

## 2013-09-01 DIAGNOSIS — I798 Other disorders of arteries, arterioles and capillaries in diseases classified elsewhere: Secondary | ICD-10-CM | POA: Diagnosis not present

## 2013-09-01 DIAGNOSIS — I10 Essential (primary) hypertension: Secondary | ICD-10-CM | POA: Diagnosis not present

## 2013-09-01 DIAGNOSIS — L89609 Pressure ulcer of unspecified heel, unspecified stage: Secondary | ICD-10-CM | POA: Diagnosis not present

## 2013-09-01 DIAGNOSIS — L8993 Pressure ulcer of unspecified site, stage 3: Secondary | ICD-10-CM | POA: Diagnosis not present

## 2013-09-02 DIAGNOSIS — E1159 Type 2 diabetes mellitus with other circulatory complications: Secondary | ICD-10-CM | POA: Diagnosis not present

## 2013-09-02 DIAGNOSIS — L89609 Pressure ulcer of unspecified heel, unspecified stage: Secondary | ICD-10-CM | POA: Diagnosis not present

## 2013-09-02 DIAGNOSIS — J449 Chronic obstructive pulmonary disease, unspecified: Secondary | ICD-10-CM | POA: Diagnosis not present

## 2013-09-02 DIAGNOSIS — I10 Essential (primary) hypertension: Secondary | ICD-10-CM | POA: Diagnosis not present

## 2013-09-02 DIAGNOSIS — L8993 Pressure ulcer of unspecified site, stage 3: Secondary | ICD-10-CM | POA: Diagnosis not present

## 2013-09-02 DIAGNOSIS — I798 Other disorders of arteries, arterioles and capillaries in diseases classified elsewhere: Secondary | ICD-10-CM | POA: Diagnosis not present

## 2013-09-08 DIAGNOSIS — L8993 Pressure ulcer of unspecified site, stage 3: Secondary | ICD-10-CM | POA: Diagnosis not present

## 2013-09-08 DIAGNOSIS — I10 Essential (primary) hypertension: Secondary | ICD-10-CM | POA: Diagnosis not present

## 2013-09-08 DIAGNOSIS — E1159 Type 2 diabetes mellitus with other circulatory complications: Secondary | ICD-10-CM | POA: Diagnosis not present

## 2013-09-08 DIAGNOSIS — J449 Chronic obstructive pulmonary disease, unspecified: Secondary | ICD-10-CM | POA: Diagnosis not present

## 2013-09-08 DIAGNOSIS — L89609 Pressure ulcer of unspecified heel, unspecified stage: Secondary | ICD-10-CM | POA: Diagnosis not present

## 2013-09-08 DIAGNOSIS — I798 Other disorders of arteries, arterioles and capillaries in diseases classified elsewhere: Secondary | ICD-10-CM | POA: Diagnosis not present

## 2013-09-10 DIAGNOSIS — I10 Essential (primary) hypertension: Secondary | ICD-10-CM | POA: Diagnosis not present

## 2013-09-10 DIAGNOSIS — L89609 Pressure ulcer of unspecified heel, unspecified stage: Secondary | ICD-10-CM | POA: Diagnosis not present

## 2013-09-10 DIAGNOSIS — J449 Chronic obstructive pulmonary disease, unspecified: Secondary | ICD-10-CM | POA: Diagnosis not present

## 2013-09-10 DIAGNOSIS — E1159 Type 2 diabetes mellitus with other circulatory complications: Secondary | ICD-10-CM | POA: Diagnosis not present

## 2013-09-10 DIAGNOSIS — L8993 Pressure ulcer of unspecified site, stage 3: Secondary | ICD-10-CM | POA: Diagnosis not present

## 2013-09-10 DIAGNOSIS — I798 Other disorders of arteries, arterioles and capillaries in diseases classified elsewhere: Secondary | ICD-10-CM | POA: Diagnosis not present

## 2013-09-15 DIAGNOSIS — E1159 Type 2 diabetes mellitus with other circulatory complications: Secondary | ICD-10-CM | POA: Diagnosis not present

## 2013-09-15 DIAGNOSIS — I10 Essential (primary) hypertension: Secondary | ICD-10-CM | POA: Diagnosis not present

## 2013-09-15 DIAGNOSIS — L8993 Pressure ulcer of unspecified site, stage 3: Secondary | ICD-10-CM | POA: Diagnosis not present

## 2013-09-15 DIAGNOSIS — I798 Other disorders of arteries, arterioles and capillaries in diseases classified elsewhere: Secondary | ICD-10-CM | POA: Diagnosis not present

## 2013-09-15 DIAGNOSIS — J449 Chronic obstructive pulmonary disease, unspecified: Secondary | ICD-10-CM | POA: Diagnosis not present

## 2013-09-15 DIAGNOSIS — L89609 Pressure ulcer of unspecified heel, unspecified stage: Secondary | ICD-10-CM | POA: Diagnosis not present

## 2013-09-16 DIAGNOSIS — E1159 Type 2 diabetes mellitus with other circulatory complications: Secondary | ICD-10-CM | POA: Diagnosis not present

## 2013-09-16 DIAGNOSIS — J449 Chronic obstructive pulmonary disease, unspecified: Secondary | ICD-10-CM | POA: Diagnosis not present

## 2013-09-16 DIAGNOSIS — L8993 Pressure ulcer of unspecified site, stage 3: Secondary | ICD-10-CM | POA: Diagnosis not present

## 2013-09-16 DIAGNOSIS — L89609 Pressure ulcer of unspecified heel, unspecified stage: Secondary | ICD-10-CM | POA: Diagnosis not present

## 2013-09-16 DIAGNOSIS — I10 Essential (primary) hypertension: Secondary | ICD-10-CM | POA: Diagnosis not present

## 2013-09-16 DIAGNOSIS — I798 Other disorders of arteries, arterioles and capillaries in diseases classified elsewhere: Secondary | ICD-10-CM | POA: Diagnosis not present

## 2013-09-18 DIAGNOSIS — J449 Chronic obstructive pulmonary disease, unspecified: Secondary | ICD-10-CM | POA: Diagnosis not present

## 2013-09-18 DIAGNOSIS — L8993 Pressure ulcer of unspecified site, stage 3: Secondary | ICD-10-CM | POA: Diagnosis not present

## 2013-09-18 DIAGNOSIS — I798 Other disorders of arteries, arterioles and capillaries in diseases classified elsewhere: Secondary | ICD-10-CM | POA: Diagnosis not present

## 2013-09-18 DIAGNOSIS — E1159 Type 2 diabetes mellitus with other circulatory complications: Secondary | ICD-10-CM | POA: Diagnosis not present

## 2013-09-18 DIAGNOSIS — I10 Essential (primary) hypertension: Secondary | ICD-10-CM | POA: Diagnosis not present

## 2013-09-18 DIAGNOSIS — L89609 Pressure ulcer of unspecified heel, unspecified stage: Secondary | ICD-10-CM | POA: Diagnosis not present

## 2013-09-26 DIAGNOSIS — I10 Essential (primary) hypertension: Secondary | ICD-10-CM | POA: Diagnosis not present

## 2013-09-26 DIAGNOSIS — J449 Chronic obstructive pulmonary disease, unspecified: Secondary | ICD-10-CM | POA: Diagnosis not present

## 2013-09-26 DIAGNOSIS — E1159 Type 2 diabetes mellitus with other circulatory complications: Secondary | ICD-10-CM | POA: Diagnosis not present

## 2013-09-26 DIAGNOSIS — I798 Other disorders of arteries, arterioles and capillaries in diseases classified elsewhere: Secondary | ICD-10-CM | POA: Diagnosis not present

## 2013-09-26 DIAGNOSIS — L89609 Pressure ulcer of unspecified heel, unspecified stage: Secondary | ICD-10-CM | POA: Diagnosis not present

## 2013-09-26 DIAGNOSIS — L8993 Pressure ulcer of unspecified site, stage 3: Secondary | ICD-10-CM | POA: Diagnosis not present

## 2013-10-01 DIAGNOSIS — E1159 Type 2 diabetes mellitus with other circulatory complications: Secondary | ICD-10-CM | POA: Diagnosis not present

## 2013-10-01 DIAGNOSIS — I10 Essential (primary) hypertension: Secondary | ICD-10-CM | POA: Diagnosis not present

## 2013-10-01 DIAGNOSIS — L89609 Pressure ulcer of unspecified heel, unspecified stage: Secondary | ICD-10-CM | POA: Diagnosis not present

## 2013-10-01 DIAGNOSIS — L8993 Pressure ulcer of unspecified site, stage 3: Secondary | ICD-10-CM | POA: Diagnosis not present

## 2013-10-01 DIAGNOSIS — J449 Chronic obstructive pulmonary disease, unspecified: Secondary | ICD-10-CM | POA: Diagnosis not present

## 2013-10-01 DIAGNOSIS — I798 Other disorders of arteries, arterioles and capillaries in diseases classified elsewhere: Secondary | ICD-10-CM | POA: Diagnosis not present

## 2013-10-03 DIAGNOSIS — N390 Urinary tract infection, site not specified: Secondary | ICD-10-CM | POA: Diagnosis not present

## 2013-10-07 DIAGNOSIS — I798 Other disorders of arteries, arterioles and capillaries in diseases classified elsewhere: Secondary | ICD-10-CM | POA: Diagnosis not present

## 2013-10-07 DIAGNOSIS — I10 Essential (primary) hypertension: Secondary | ICD-10-CM | POA: Diagnosis not present

## 2013-10-07 DIAGNOSIS — E1159 Type 2 diabetes mellitus with other circulatory complications: Secondary | ICD-10-CM | POA: Diagnosis not present

## 2013-10-07 DIAGNOSIS — L89609 Pressure ulcer of unspecified heel, unspecified stage: Secondary | ICD-10-CM | POA: Diagnosis not present

## 2013-10-07 DIAGNOSIS — J449 Chronic obstructive pulmonary disease, unspecified: Secondary | ICD-10-CM | POA: Diagnosis not present

## 2013-10-07 DIAGNOSIS — L8993 Pressure ulcer of unspecified site, stage 3: Secondary | ICD-10-CM | POA: Diagnosis not present

## 2013-10-14 DIAGNOSIS — L89609 Pressure ulcer of unspecified heel, unspecified stage: Secondary | ICD-10-CM | POA: Diagnosis not present

## 2013-10-14 DIAGNOSIS — J449 Chronic obstructive pulmonary disease, unspecified: Secondary | ICD-10-CM | POA: Diagnosis not present

## 2013-10-14 DIAGNOSIS — L8993 Pressure ulcer of unspecified site, stage 3: Secondary | ICD-10-CM | POA: Diagnosis not present

## 2013-10-14 DIAGNOSIS — E1159 Type 2 diabetes mellitus with other circulatory complications: Secondary | ICD-10-CM | POA: Diagnosis not present

## 2013-10-14 DIAGNOSIS — I798 Other disorders of arteries, arterioles and capillaries in diseases classified elsewhere: Secondary | ICD-10-CM | POA: Diagnosis not present

## 2013-10-14 DIAGNOSIS — I10 Essential (primary) hypertension: Secondary | ICD-10-CM | POA: Diagnosis not present

## 2013-10-15 DIAGNOSIS — I798 Other disorders of arteries, arterioles and capillaries in diseases classified elsewhere: Secondary | ICD-10-CM | POA: Diagnosis not present

## 2013-10-15 DIAGNOSIS — Z9981 Dependence on supplemental oxygen: Secondary | ICD-10-CM | POA: Diagnosis not present

## 2013-10-15 DIAGNOSIS — L89309 Pressure ulcer of unspecified buttock, unspecified stage: Secondary | ICD-10-CM | POA: Diagnosis not present

## 2013-10-15 DIAGNOSIS — I2510 Atherosclerotic heart disease of native coronary artery without angina pectoris: Secondary | ICD-10-CM | POA: Diagnosis not present

## 2013-10-15 DIAGNOSIS — L8991 Pressure ulcer of unspecified site, stage 1: Secondary | ICD-10-CM | POA: Diagnosis not present

## 2013-10-15 DIAGNOSIS — Z794 Long term (current) use of insulin: Secondary | ICD-10-CM | POA: Diagnosis not present

## 2013-10-15 DIAGNOSIS — E1159 Type 2 diabetes mellitus with other circulatory complications: Secondary | ICD-10-CM | POA: Diagnosis not present

## 2013-10-15 DIAGNOSIS — F329 Major depressive disorder, single episode, unspecified: Secondary | ICD-10-CM | POA: Diagnosis not present

## 2013-10-15 DIAGNOSIS — F3289 Other specified depressive episodes: Secondary | ICD-10-CM | POA: Diagnosis not present

## 2013-10-15 DIAGNOSIS — Z7982 Long term (current) use of aspirin: Secondary | ICD-10-CM | POA: Diagnosis not present

## 2013-10-15 DIAGNOSIS — R32 Unspecified urinary incontinence: Secondary | ICD-10-CM | POA: Diagnosis not present

## 2013-10-15 DIAGNOSIS — J449 Chronic obstructive pulmonary disease, unspecified: Secondary | ICD-10-CM | POA: Diagnosis not present

## 2013-10-15 DIAGNOSIS — Z7902 Long term (current) use of antithrombotics/antiplatelets: Secondary | ICD-10-CM | POA: Diagnosis not present

## 2013-10-15 DIAGNOSIS — I10 Essential (primary) hypertension: Secondary | ICD-10-CM | POA: Diagnosis not present

## 2013-10-15 DIAGNOSIS — Z993 Dependence on wheelchair: Secondary | ICD-10-CM | POA: Diagnosis not present

## 2013-10-15 DIAGNOSIS — Z87440 Personal history of urinary (tract) infections: Secondary | ICD-10-CM | POA: Diagnosis not present

## 2013-10-15 DIAGNOSIS — I498 Other specified cardiac arrhythmias: Secondary | ICD-10-CM | POA: Diagnosis not present

## 2013-10-15 DIAGNOSIS — R159 Full incontinence of feces: Secondary | ICD-10-CM | POA: Diagnosis not present

## 2013-10-15 DIAGNOSIS — Z9181 History of falling: Secondary | ICD-10-CM | POA: Diagnosis not present

## 2013-10-15 DIAGNOSIS — G9050 Complex regional pain syndrome I, unspecified: Secondary | ICD-10-CM | POA: Diagnosis not present

## 2013-10-15 DIAGNOSIS — D649 Anemia, unspecified: Secondary | ICD-10-CM | POA: Diagnosis not present

## 2013-10-15 DIAGNOSIS — I69959 Hemiplegia and hemiparesis following unspecified cerebrovascular disease affecting unspecified side: Secondary | ICD-10-CM | POA: Diagnosis not present

## 2013-10-20 DIAGNOSIS — I798 Other disorders of arteries, arterioles and capillaries in diseases classified elsewhere: Secondary | ICD-10-CM | POA: Diagnosis not present

## 2013-10-20 DIAGNOSIS — J449 Chronic obstructive pulmonary disease, unspecified: Secondary | ICD-10-CM | POA: Diagnosis not present

## 2013-10-20 DIAGNOSIS — D649 Anemia, unspecified: Secondary | ICD-10-CM | POA: Diagnosis not present

## 2013-10-20 DIAGNOSIS — I10 Essential (primary) hypertension: Secondary | ICD-10-CM | POA: Diagnosis not present

## 2013-10-20 DIAGNOSIS — E1159 Type 2 diabetes mellitus with other circulatory complications: Secondary | ICD-10-CM | POA: Diagnosis not present

## 2013-10-20 DIAGNOSIS — I69959 Hemiplegia and hemiparesis following unspecified cerebrovascular disease affecting unspecified side: Secondary | ICD-10-CM | POA: Diagnosis not present

## 2013-10-23 DIAGNOSIS — G9050 Complex regional pain syndrome I, unspecified: Secondary | ICD-10-CM | POA: Diagnosis not present

## 2013-10-23 DIAGNOSIS — M533 Sacrococcygeal disorders, not elsewhere classified: Secondary | ICD-10-CM | POA: Diagnosis not present

## 2013-10-23 DIAGNOSIS — I6990 Unspecified sequelae of unspecified cerebrovascular disease: Secondary | ICD-10-CM | POA: Diagnosis not present

## 2013-10-24 DIAGNOSIS — J449 Chronic obstructive pulmonary disease, unspecified: Secondary | ICD-10-CM | POA: Diagnosis not present

## 2013-10-24 DIAGNOSIS — E1159 Type 2 diabetes mellitus with other circulatory complications: Secondary | ICD-10-CM | POA: Diagnosis not present

## 2013-10-24 DIAGNOSIS — I10 Essential (primary) hypertension: Secondary | ICD-10-CM | POA: Diagnosis not present

## 2013-10-24 DIAGNOSIS — I798 Other disorders of arteries, arterioles and capillaries in diseases classified elsewhere: Secondary | ICD-10-CM | POA: Diagnosis not present

## 2013-10-24 DIAGNOSIS — D649 Anemia, unspecified: Secondary | ICD-10-CM | POA: Diagnosis not present

## 2013-10-24 DIAGNOSIS — I69959 Hemiplegia and hemiparesis following unspecified cerebrovascular disease affecting unspecified side: Secondary | ICD-10-CM | POA: Diagnosis not present

## 2013-10-29 DIAGNOSIS — I69959 Hemiplegia and hemiparesis following unspecified cerebrovascular disease affecting unspecified side: Secondary | ICD-10-CM | POA: Diagnosis not present

## 2013-10-29 DIAGNOSIS — I10 Essential (primary) hypertension: Secondary | ICD-10-CM | POA: Diagnosis not present

## 2013-10-29 DIAGNOSIS — E1159 Type 2 diabetes mellitus with other circulatory complications: Secondary | ICD-10-CM | POA: Diagnosis not present

## 2013-10-29 DIAGNOSIS — D649 Anemia, unspecified: Secondary | ICD-10-CM | POA: Diagnosis not present

## 2013-10-29 DIAGNOSIS — J449 Chronic obstructive pulmonary disease, unspecified: Secondary | ICD-10-CM | POA: Diagnosis not present

## 2013-10-29 DIAGNOSIS — I798 Other disorders of arteries, arterioles and capillaries in diseases classified elsewhere: Secondary | ICD-10-CM | POA: Diagnosis not present

## 2013-11-04 DIAGNOSIS — J449 Chronic obstructive pulmonary disease, unspecified: Secondary | ICD-10-CM | POA: Diagnosis not present

## 2013-11-04 DIAGNOSIS — E1159 Type 2 diabetes mellitus with other circulatory complications: Secondary | ICD-10-CM | POA: Diagnosis not present

## 2013-11-04 DIAGNOSIS — I10 Essential (primary) hypertension: Secondary | ICD-10-CM | POA: Diagnosis not present

## 2013-11-04 DIAGNOSIS — I798 Other disorders of arteries, arterioles and capillaries in diseases classified elsewhere: Secondary | ICD-10-CM | POA: Diagnosis not present

## 2013-11-04 DIAGNOSIS — I69959 Hemiplegia and hemiparesis following unspecified cerebrovascular disease affecting unspecified side: Secondary | ICD-10-CM | POA: Diagnosis not present

## 2013-11-04 DIAGNOSIS — D649 Anemia, unspecified: Secondary | ICD-10-CM | POA: Diagnosis not present

## 2013-11-11 DIAGNOSIS — E1159 Type 2 diabetes mellitus with other circulatory complications: Secondary | ICD-10-CM | POA: Diagnosis not present

## 2013-11-11 DIAGNOSIS — J449 Chronic obstructive pulmonary disease, unspecified: Secondary | ICD-10-CM | POA: Diagnosis not present

## 2013-11-11 DIAGNOSIS — I10 Essential (primary) hypertension: Secondary | ICD-10-CM | POA: Diagnosis not present

## 2013-11-11 DIAGNOSIS — D649 Anemia, unspecified: Secondary | ICD-10-CM | POA: Diagnosis not present

## 2013-11-11 DIAGNOSIS — I69959 Hemiplegia and hemiparesis following unspecified cerebrovascular disease affecting unspecified side: Secondary | ICD-10-CM | POA: Diagnosis not present

## 2013-11-11 DIAGNOSIS — I798 Other disorders of arteries, arterioles and capillaries in diseases classified elsewhere: Secondary | ICD-10-CM | POA: Diagnosis not present

## 2013-11-18 DIAGNOSIS — E1159 Type 2 diabetes mellitus with other circulatory complications: Secondary | ICD-10-CM | POA: Diagnosis not present

## 2013-11-18 DIAGNOSIS — D649 Anemia, unspecified: Secondary | ICD-10-CM | POA: Diagnosis not present

## 2013-11-18 DIAGNOSIS — I798 Other disorders of arteries, arterioles and capillaries in diseases classified elsewhere: Secondary | ICD-10-CM | POA: Diagnosis not present

## 2013-11-18 DIAGNOSIS — I69959 Hemiplegia and hemiparesis following unspecified cerebrovascular disease affecting unspecified side: Secondary | ICD-10-CM | POA: Diagnosis not present

## 2013-11-18 DIAGNOSIS — I10 Essential (primary) hypertension: Secondary | ICD-10-CM | POA: Diagnosis not present

## 2013-11-18 DIAGNOSIS — J449 Chronic obstructive pulmonary disease, unspecified: Secondary | ICD-10-CM | POA: Diagnosis not present

## 2013-11-26 DIAGNOSIS — I69959 Hemiplegia and hemiparesis following unspecified cerebrovascular disease affecting unspecified side: Secondary | ICD-10-CM | POA: Diagnosis not present

## 2013-11-26 DIAGNOSIS — J449 Chronic obstructive pulmonary disease, unspecified: Secondary | ICD-10-CM | POA: Diagnosis not present

## 2013-11-26 DIAGNOSIS — I798 Other disorders of arteries, arterioles and capillaries in diseases classified elsewhere: Secondary | ICD-10-CM | POA: Diagnosis not present

## 2013-11-26 DIAGNOSIS — I10 Essential (primary) hypertension: Secondary | ICD-10-CM | POA: Diagnosis not present

## 2013-11-26 DIAGNOSIS — E1159 Type 2 diabetes mellitus with other circulatory complications: Secondary | ICD-10-CM | POA: Diagnosis not present

## 2013-11-26 DIAGNOSIS — D649 Anemia, unspecified: Secondary | ICD-10-CM | POA: Diagnosis not present

## 2013-12-01 DIAGNOSIS — R0789 Other chest pain: Secondary | ICD-10-CM | POA: Diagnosis not present

## 2013-12-01 DIAGNOSIS — Z7982 Long term (current) use of aspirin: Secondary | ICD-10-CM | POA: Diagnosis not present

## 2013-12-01 DIAGNOSIS — IMO0002 Reserved for concepts with insufficient information to code with codable children: Secondary | ICD-10-CM | POA: Diagnosis not present

## 2013-12-01 DIAGNOSIS — Z885 Allergy status to narcotic agent status: Secondary | ICD-10-CM | POA: Diagnosis not present

## 2013-12-01 DIAGNOSIS — I499 Cardiac arrhythmia, unspecified: Secondary | ICD-10-CM | POA: Diagnosis not present

## 2013-12-01 DIAGNOSIS — Z888 Allergy status to other drugs, medicaments and biological substances status: Secondary | ICD-10-CM | POA: Diagnosis not present

## 2013-12-01 DIAGNOSIS — I471 Supraventricular tachycardia, unspecified: Secondary | ICD-10-CM | POA: Diagnosis not present

## 2013-12-01 DIAGNOSIS — R5381 Other malaise: Secondary | ICD-10-CM | POA: Diagnosis not present

## 2013-12-01 DIAGNOSIS — I10 Essential (primary) hypertension: Secondary | ICD-10-CM | POA: Diagnosis not present

## 2013-12-01 DIAGNOSIS — R5383 Other fatigue: Secondary | ICD-10-CM | POA: Diagnosis not present

## 2013-12-01 DIAGNOSIS — Z8673 Personal history of transient ischemic attack (TIA), and cerebral infarction without residual deficits: Secondary | ICD-10-CM | POA: Diagnosis not present

## 2013-12-01 DIAGNOSIS — I798 Other disorders of arteries, arterioles and capillaries in diseases classified elsewhere: Secondary | ICD-10-CM | POA: Diagnosis not present

## 2013-12-01 DIAGNOSIS — Z9089 Acquired absence of other organs: Secondary | ICD-10-CM | POA: Diagnosis not present

## 2013-12-01 DIAGNOSIS — D649 Anemia, unspecified: Secondary | ICD-10-CM | POA: Diagnosis not present

## 2013-12-01 DIAGNOSIS — R079 Chest pain, unspecified: Secondary | ICD-10-CM | POA: Diagnosis not present

## 2013-12-01 DIAGNOSIS — Z794 Long term (current) use of insulin: Secondary | ICD-10-CM | POA: Diagnosis not present

## 2013-12-01 DIAGNOSIS — R7309 Other abnormal glucose: Secondary | ICD-10-CM | POA: Diagnosis not present

## 2013-12-01 DIAGNOSIS — Z9109 Other allergy status, other than to drugs and biological substances: Secondary | ICD-10-CM | POA: Diagnosis not present

## 2013-12-01 DIAGNOSIS — E1159 Type 2 diabetes mellitus with other circulatory complications: Secondary | ICD-10-CM | POA: Diagnosis not present

## 2013-12-01 DIAGNOSIS — J449 Chronic obstructive pulmonary disease, unspecified: Secondary | ICD-10-CM | POA: Diagnosis not present

## 2013-12-01 DIAGNOSIS — F17200 Nicotine dependence, unspecified, uncomplicated: Secondary | ICD-10-CM | POA: Diagnosis not present

## 2013-12-01 DIAGNOSIS — Z79899 Other long term (current) drug therapy: Secondary | ICD-10-CM | POA: Diagnosis not present

## 2013-12-01 DIAGNOSIS — I69965 Other paralytic syndrome following unspecified cerebrovascular disease, bilateral: Secondary | ICD-10-CM | POA: Diagnosis not present

## 2013-12-01 DIAGNOSIS — Z91018 Allergy to other foods: Secondary | ICD-10-CM | POA: Diagnosis not present

## 2013-12-01 DIAGNOSIS — I69959 Hemiplegia and hemiparesis following unspecified cerebrovascular disease affecting unspecified side: Secondary | ICD-10-CM | POA: Diagnosis not present

## 2013-12-01 DIAGNOSIS — R279 Unspecified lack of coordination: Secondary | ICD-10-CM | POA: Diagnosis not present

## 2013-12-01 DIAGNOSIS — E876 Hypokalemia: Secondary | ICD-10-CM | POA: Diagnosis not present

## 2013-12-01 DIAGNOSIS — E119 Type 2 diabetes mellitus without complications: Secondary | ICD-10-CM | POA: Diagnosis not present

## 2013-12-04 DIAGNOSIS — I10 Essential (primary) hypertension: Secondary | ICD-10-CM | POA: Diagnosis not present

## 2013-12-04 DIAGNOSIS — I798 Other disorders of arteries, arterioles and capillaries in diseases classified elsewhere: Secondary | ICD-10-CM | POA: Diagnosis not present

## 2013-12-04 DIAGNOSIS — E1159 Type 2 diabetes mellitus with other circulatory complications: Secondary | ICD-10-CM | POA: Diagnosis not present

## 2013-12-04 DIAGNOSIS — J449 Chronic obstructive pulmonary disease, unspecified: Secondary | ICD-10-CM | POA: Diagnosis not present

## 2013-12-04 DIAGNOSIS — I69959 Hemiplegia and hemiparesis following unspecified cerebrovascular disease affecting unspecified side: Secondary | ICD-10-CM | POA: Diagnosis not present

## 2013-12-04 DIAGNOSIS — D649 Anemia, unspecified: Secondary | ICD-10-CM | POA: Diagnosis not present

## 2013-12-05 DIAGNOSIS — I798 Other disorders of arteries, arterioles and capillaries in diseases classified elsewhere: Secondary | ICD-10-CM | POA: Diagnosis not present

## 2013-12-05 DIAGNOSIS — D649 Anemia, unspecified: Secondary | ICD-10-CM | POA: Diagnosis not present

## 2013-12-05 DIAGNOSIS — J449 Chronic obstructive pulmonary disease, unspecified: Secondary | ICD-10-CM | POA: Diagnosis not present

## 2013-12-05 DIAGNOSIS — I10 Essential (primary) hypertension: Secondary | ICD-10-CM | POA: Diagnosis not present

## 2013-12-05 DIAGNOSIS — E1159 Type 2 diabetes mellitus with other circulatory complications: Secondary | ICD-10-CM | POA: Diagnosis not present

## 2013-12-05 DIAGNOSIS — I69959 Hemiplegia and hemiparesis following unspecified cerebrovascular disease affecting unspecified side: Secondary | ICD-10-CM | POA: Diagnosis not present

## 2013-12-10 DIAGNOSIS — I639 Cerebral infarction, unspecified: Secondary | ICD-10-CM

## 2013-12-10 HISTORY — DX: Cerebral infarction, unspecified: I63.9

## 2013-12-11 DIAGNOSIS — I6350 Cerebral infarction due to unspecified occlusion or stenosis of unspecified cerebral artery: Secondary | ICD-10-CM | POA: Diagnosis not present

## 2013-12-11 DIAGNOSIS — E785 Hyperlipidemia, unspecified: Secondary | ICD-10-CM | POA: Diagnosis not present

## 2013-12-11 DIAGNOSIS — I10 Essential (primary) hypertension: Secondary | ICD-10-CM | POA: Diagnosis not present

## 2013-12-11 DIAGNOSIS — E119 Type 2 diabetes mellitus without complications: Secondary | ICD-10-CM | POA: Diagnosis not present

## 2013-12-11 DIAGNOSIS — G894 Chronic pain syndrome: Secondary | ICD-10-CM | POA: Diagnosis not present

## 2013-12-11 DIAGNOSIS — M545 Low back pain, unspecified: Secondary | ICD-10-CM | POA: Diagnosis not present

## 2013-12-12 DIAGNOSIS — I69959 Hemiplegia and hemiparesis following unspecified cerebrovascular disease affecting unspecified side: Secondary | ICD-10-CM | POA: Diagnosis not present

## 2013-12-12 DIAGNOSIS — J449 Chronic obstructive pulmonary disease, unspecified: Secondary | ICD-10-CM | POA: Diagnosis not present

## 2013-12-12 DIAGNOSIS — D649 Anemia, unspecified: Secondary | ICD-10-CM | POA: Diagnosis not present

## 2013-12-12 DIAGNOSIS — I798 Other disorders of arteries, arterioles and capillaries in diseases classified elsewhere: Secondary | ICD-10-CM | POA: Diagnosis not present

## 2013-12-12 DIAGNOSIS — E1159 Type 2 diabetes mellitus with other circulatory complications: Secondary | ICD-10-CM | POA: Diagnosis not present

## 2013-12-12 DIAGNOSIS — I10 Essential (primary) hypertension: Secondary | ICD-10-CM | POA: Diagnosis not present

## 2013-12-19 DIAGNOSIS — E1159 Type 2 diabetes mellitus with other circulatory complications: Secondary | ICD-10-CM | POA: Diagnosis not present

## 2013-12-29 ENCOUNTER — Non-Acute Institutional Stay (SKILLED_NURSING_FACILITY): Payer: Medicare Other | Admitting: Adult Health

## 2013-12-29 ENCOUNTER — Encounter: Payer: Self-pay | Admitting: Adult Health

## 2013-12-29 DIAGNOSIS — I10 Essential (primary) hypertension: Secondary | ICD-10-CM

## 2013-12-29 DIAGNOSIS — I6350 Cerebral infarction due to unspecified occlusion or stenosis of unspecified cerebral artery: Secondary | ICD-10-CM | POA: Diagnosis not present

## 2013-12-29 DIAGNOSIS — M792 Neuralgia and neuritis, unspecified: Secondary | ICD-10-CM

## 2013-12-29 DIAGNOSIS — I69354 Hemiplegia and hemiparesis following cerebral infarction affecting left non-dominant side: Secondary | ICD-10-CM

## 2013-12-29 DIAGNOSIS — I2510 Atherosclerotic heart disease of native coronary artery without angina pectoris: Secondary | ICD-10-CM

## 2013-12-29 DIAGNOSIS — I639 Cerebral infarction, unspecified: Secondary | ICD-10-CM

## 2013-12-29 DIAGNOSIS — E039 Hypothyroidism, unspecified: Secondary | ICD-10-CM

## 2013-12-29 DIAGNOSIS — I69959 Hemiplegia and hemiparesis following unspecified cerebrovascular disease affecting unspecified side: Secondary | ICD-10-CM

## 2013-12-29 DIAGNOSIS — F419 Anxiety disorder, unspecified: Secondary | ICD-10-CM

## 2013-12-29 DIAGNOSIS — IMO0002 Reserved for concepts with insufficient information to code with codable children: Secondary | ICD-10-CM

## 2013-12-29 DIAGNOSIS — E785 Hyperlipidemia, unspecified: Secondary | ICD-10-CM

## 2013-12-29 DIAGNOSIS — J449 Chronic obstructive pulmonary disease, unspecified: Secondary | ICD-10-CM

## 2013-12-29 DIAGNOSIS — F411 Generalized anxiety disorder: Secondary | ICD-10-CM

## 2013-12-29 DIAGNOSIS — F17200 Nicotine dependence, unspecified, uncomplicated: Secondary | ICD-10-CM

## 2013-12-29 DIAGNOSIS — K5900 Constipation, unspecified: Secondary | ICD-10-CM

## 2013-12-29 NOTE — Progress Notes (Signed)
Patient ID: Susan [name], female   DOB: [birth year], 63 y.o.   MRN: [id number]     [facility]  Allergies  Allergen Reactions  . Niaspan [Niacin Er]   . Valium [Diazepam]   . Wellbutrin [Bupropion]      Chief Complaint  Patient presents with  . Acute Visit    follow up transfer     HPI:  She has been transferred to this facility from home. This more than likely represent a long term placement for her. She tells me that she has had blood in her stools and was contacting a GI doctor in [place]; however; this did not work out for her. Per nursing she has not had blood in her stools here. She tells me that she has had several stools with blood present. She is also complaining of constipation today.    Past Medical History  Diagnosis Date  . Stroke   . Hyperlipidemia   . Diabetes mellitus without complication   . COPD (chronic obstructive pulmonary disease)   . Depression   . Hypertension   . Thyroid disease   . Anemia   . CAD (coronary artery disease)   . Chronic pain   . Hemiplegia affecting non-dominant side, post-stroke   . Fall   . Urine retention     History reviewed. No pertinent past surgical history.  VITAL SIGNS BP 108/60  Pulse 88  Wt 147 lb 8 oz (66.906 kg)   Patient's Medications  New Prescriptions   No medications on file  Previous Medications   ACLIDINIUM BROMIDE (TUDORZA PRESSAIR IN)    Inhale 2 puffs into the lungs daily.   ALBUTEROL (PROVENTIL HFA;VENTOLIN HFA) 108 (90 BASE) MCG/ACT INHALER    Inhale 1 puff into the lungs every 6 (six) hours as needed for wheezing or shortness of breath.   AMITRIPTYLINE (ELAVIL) 50 MG TABLET    Take 50 mg by mouth at bedtime.   CLONAZEPAM (KLONOPIN) 0.5 MG TABLET    Take 0.5 mg by mouth every 6 (six) hours as needed for anxiety.   DILTIAZEM (CARDIZEM CD) 120 MG 24 HR CAPSULE    Take 120 mg by mouth daily.   GABAPENTIN (NEURONTIN) 600 MG TABLET    Take 600 mg by mouth 3 (three) times daily.   INSULIN DETEMIR  (LEVEMIR) 100 UNIT/ML INJECTION    Inject 30 Units into the skin at bedtime.   INSULIN LISPRO (HUMALOG) 100 UNIT/ML INJECTION    Inject 5 Units into the skin 3 (three) times daily before meals. For cbg >=150   ISOSORBIDE MONONITRATE (IMDUR) 30 MG 24 HR TABLET    Take 30 mg by mouth daily.   LEVOTHYROXINE (SYNTHROID, LEVOTHROID) 25 MCG TABLET    Take 25 mcg by mouth daily before breakfast.   NICOTINE (NICODERM CQ - DOSED IN MG/24 HOURS) 21 MG/24HR PATCH    Place 21 mg onto the skin daily.   OMEGA-3 ACID ETHYL ESTERS (LOVAZA) 1 G CAPSULE    Take 2 g by mouth daily.   OXYCODONE (ROXICODONE) 15 MG IMMEDIATE RELEASE TABLET    Take 15 mg by mouth every 6 (six) hours as needed for pain.   ROSUVASTATIN (CRESTOR) 40 MG TABLET    Take 40 mg by mouth daily.  Modified Medications   No medications on file  Discontinued Medications   No medications on file    SIGNIFICANT DIAGNOSTIC EXAMS   LABS REVIEWED:   Lab results pending   Review of Systems  Constitutional: Negative  for malaise/fatigue.  Respiratory: Negative for cough and shortness of breath.   Cardiovascular: Negative for chest pain, palpitations and leg swelling.  Gastrointestinal: Positive for constipation. Negative for heartburn and abdominal pain.  Musculoskeletal: Negative for joint pain and myalgias.  Skin: Negative.   Psychiatric/Behavioral: Negative for depression. The patient is not nervous/anxious.      Physical Exam  Constitutional: She appears well-developed and well-nourished. No distress.  Neck: Neck supple. No JVD present. No thyromegaly present.  Cardiovascular: Normal rate, regular rhythm and intact distal pulses.   Respiratory: Effort normal and breath sounds normal. No respiratory distress. She has no wheezes.  GI: Soft. Bowel sounds are normal. She exhibits no distension. There is no tenderness.  Musculoskeletal: She exhibits no edema.  Has left side hemiparesis present   Neurological: She is alert.  Skin: Skin  is warm and dry. She is not diaphoretic.  Psychiatric: She has a normal mood and affect.     ASSESSMENT/ PLAN:  1. CVA with left hemiparesis: is neurologically stable; is presently not taking any medications at this time will continue to monitor her status.   2. Diabetes: will continue her levemir 30 units daily and humalog 5 units prior to meals for cbg >=150 will monitor   3. COPD: is stable will continue tudorza 2 puffs daily and albuterol 1 puff every 6 hours as needed and will monitor her status.   4. Dyslipidemia: will continue lovaza 2 gm daily and crestor 40 mg daily   5. Hypertension: will continue cardizem cd 120 mg daily   6. CAD: no complaints of chest pain present; will continue imdur 30 mg dail   7. Hypothyroidism: will continue synthroid 25 mcg daily   8. Smoker: will continue nicotine patch 21 mg daily; she is not voicing need to smoke at this time  9. Chronic neuropathic pain: will continue neurontin 600 mg three times daily oxycodone 15 mg every 6 hours as needed and elavil 50 mg nightly and will monitor   10. Anxiety: is stable at this time; will continue klonopin 0.,5 every 6 hours as needed    11. Constipation: is worse; will give her a dulcolax supp one time now and will being miralax 17 gm daily; will have nursing guaiac stools X3 and report due to her concerns of blood in stools.    Time spent with patient 50 minutes      Ok Edwards NP [facility]  Contact [phone] Monday through Friday 8am- 5pm  After hours call [phone]

## 2013-12-30 DIAGNOSIS — R269 Unspecified abnormalities of gait and mobility: Secondary | ICD-10-CM | POA: Diagnosis not present

## 2013-12-30 DIAGNOSIS — M6281 Muscle weakness (generalized): Secondary | ICD-10-CM | POA: Diagnosis not present

## 2013-12-30 DIAGNOSIS — I2510 Atherosclerotic heart disease of native coronary artery without angina pectoris: Secondary | ICD-10-CM | POA: Insufficient documentation

## 2013-12-30 DIAGNOSIS — M792 Neuralgia and neuritis, unspecified: Secondary | ICD-10-CM | POA: Insufficient documentation

## 2013-12-30 DIAGNOSIS — I639 Cerebral infarction, unspecified: Secondary | ICD-10-CM | POA: Insufficient documentation

## 2013-12-30 DIAGNOSIS — I69959 Hemiplegia and hemiparesis following unspecified cerebrovascular disease affecting unspecified side: Secondary | ICD-10-CM | POA: Diagnosis not present

## 2013-12-30 DIAGNOSIS — I69354 Hemiplegia and hemiparesis following cerebral infarction affecting left non-dominant side: Secondary | ICD-10-CM | POA: Insufficient documentation

## 2013-12-30 DIAGNOSIS — E039 Hypothyroidism, unspecified: Secondary | ICD-10-CM | POA: Insufficient documentation

## 2013-12-30 DIAGNOSIS — K5900 Constipation, unspecified: Secondary | ICD-10-CM | POA: Insufficient documentation

## 2013-12-30 DIAGNOSIS — F17200 Nicotine dependence, unspecified, uncomplicated: Secondary | ICD-10-CM | POA: Insufficient documentation

## 2013-12-30 DIAGNOSIS — E785 Hyperlipidemia, unspecified: Secondary | ICD-10-CM | POA: Insufficient documentation

## 2013-12-30 DIAGNOSIS — F419 Anxiety disorder, unspecified: Secondary | ICD-10-CM | POA: Insufficient documentation

## 2013-12-30 DIAGNOSIS — I10 Essential (primary) hypertension: Secondary | ICD-10-CM | POA: Insufficient documentation

## 2013-12-30 DIAGNOSIS — J449 Chronic obstructive pulmonary disease, unspecified: Secondary | ICD-10-CM | POA: Insufficient documentation

## 2013-12-30 DIAGNOSIS — R279 Unspecified lack of coordination: Secondary | ICD-10-CM | POA: Diagnosis not present

## 2013-12-30 MED ORDER — POLYETHYLENE GLYCOL 3350 17 GM/SCOOP PO POWD: 17.0000 g | Freq: Every day | ORAL | Status: DC

## 2013-12-31 DIAGNOSIS — R279 Unspecified lack of coordination: Secondary | ICD-10-CM | POA: Diagnosis not present

## 2013-12-31 DIAGNOSIS — I69959 Hemiplegia and hemiparesis following unspecified cerebrovascular disease affecting unspecified side: Secondary | ICD-10-CM | POA: Diagnosis not present

## 2013-12-31 DIAGNOSIS — M6281 Muscle weakness (generalized): Secondary | ICD-10-CM | POA: Diagnosis not present

## 2013-12-31 DIAGNOSIS — R269 Unspecified abnormalities of gait and mobility: Secondary | ICD-10-CM | POA: Diagnosis not present

## 2014-01-01 DIAGNOSIS — R279 Unspecified lack of coordination: Secondary | ICD-10-CM | POA: Diagnosis not present

## 2014-01-01 DIAGNOSIS — M6281 Muscle weakness (generalized): Secondary | ICD-10-CM | POA: Diagnosis not present

## 2014-01-01 DIAGNOSIS — R269 Unspecified abnormalities of gait and mobility: Secondary | ICD-10-CM | POA: Diagnosis not present

## 2014-01-01 DIAGNOSIS — I69959 Hemiplegia and hemiparesis following unspecified cerebrovascular disease affecting unspecified side: Secondary | ICD-10-CM | POA: Diagnosis not present

## 2014-01-02 DIAGNOSIS — R269 Unspecified abnormalities of gait and mobility: Secondary | ICD-10-CM | POA: Diagnosis not present

## 2014-01-02 DIAGNOSIS — M6281 Muscle weakness (generalized): Secondary | ICD-10-CM | POA: Diagnosis not present

## 2014-01-02 DIAGNOSIS — I69959 Hemiplegia and hemiparesis following unspecified cerebrovascular disease affecting unspecified side: Secondary | ICD-10-CM | POA: Diagnosis not present

## 2014-01-02 DIAGNOSIS — R279 Unspecified lack of coordination: Secondary | ICD-10-CM | POA: Diagnosis not present

## 2014-01-05 ENCOUNTER — Non-Acute Institutional Stay (SKILLED_NURSING_FACILITY): Payer: Medicare Other | Admitting: Internal Medicine

## 2014-01-05 ENCOUNTER — Other Ambulatory Visit: Payer: Self-pay | Admitting: *Deleted

## 2014-01-05 ENCOUNTER — Encounter: Payer: Self-pay | Admitting: Internal Medicine

## 2014-01-05 DIAGNOSIS — I2510 Atherosclerotic heart disease of native coronary artery without angina pectoris: Secondary | ICD-10-CM

## 2014-01-05 DIAGNOSIS — R300 Dysuria: Secondary | ICD-10-CM

## 2014-01-05 DIAGNOSIS — F419 Anxiety disorder, unspecified: Secondary | ICD-10-CM

## 2014-01-05 DIAGNOSIS — E1165 Type 2 diabetes mellitus with hyperglycemia: Secondary | ICD-10-CM

## 2014-01-05 DIAGNOSIS — G8929 Other chronic pain: Secondary | ICD-10-CM

## 2014-01-05 DIAGNOSIS — I69959 Hemiplegia and hemiparesis following unspecified cerebrovascular disease affecting unspecified side: Secondary | ICD-10-CM | POA: Diagnosis not present

## 2014-01-05 DIAGNOSIS — F17200 Nicotine dependence, unspecified, uncomplicated: Secondary | ICD-10-CM

## 2014-01-05 DIAGNOSIS — K5900 Constipation, unspecified: Secondary | ICD-10-CM

## 2014-01-05 DIAGNOSIS — M6281 Muscle weakness (generalized): Secondary | ICD-10-CM | POA: Diagnosis not present

## 2014-01-05 DIAGNOSIS — F411 Generalized anxiety disorder: Secondary | ICD-10-CM

## 2014-01-05 DIAGNOSIS — I10 Essential (primary) hypertension: Secondary | ICD-10-CM

## 2014-01-05 DIAGNOSIS — I6350 Cerebral infarction due to unspecified occlusion or stenosis of unspecified cerebral artery: Secondary | ICD-10-CM | POA: Diagnosis not present

## 2014-01-05 DIAGNOSIS — I25119 Atherosclerotic heart disease of native coronary artery with unspecified angina pectoris: Secondary | ICD-10-CM

## 2014-01-05 DIAGNOSIS — R279 Unspecified lack of coordination: Secondary | ICD-10-CM | POA: Diagnosis not present

## 2014-01-05 DIAGNOSIS — E1149 Type 2 diabetes mellitus with other diabetic neurological complication: Secondary | ICD-10-CM

## 2014-01-05 DIAGNOSIS — IMO0002 Reserved for concepts with insufficient information to code with codable children: Secondary | ICD-10-CM

## 2014-01-05 DIAGNOSIS — I209 Angina pectoris, unspecified: Secondary | ICD-10-CM

## 2014-01-05 DIAGNOSIS — J449 Chronic obstructive pulmonary disease, unspecified: Secondary | ICD-10-CM

## 2014-01-05 DIAGNOSIS — R269 Unspecified abnormalities of gait and mobility: Secondary | ICD-10-CM | POA: Diagnosis not present

## 2014-01-05 DIAGNOSIS — I639 Cerebral infarction, unspecified: Secondary | ICD-10-CM

## 2014-01-05 DIAGNOSIS — E038 Other specified hypothyroidism: Secondary | ICD-10-CM

## 2014-01-05 DIAGNOSIS — N390 Urinary tract infection, site not specified: Secondary | ICD-10-CM | POA: Diagnosis not present

## 2014-01-05 MED ORDER — OXYCODONE HCL 15 MG PO TABS: ORAL_TABLET | ORAL | Status: DC

## 2014-01-05 NOTE — Progress Notes (Signed)
Patient ID: Susan [name], female   DOB: Apr 21, 1951, 63 y.o.   MRN: [id number]    Facility: [facility]   Chief Complaint  Patient presents with  . new admission    new admission from home   Allergies  Allergen Reactions  . Niaspan [Niacin Er]   . Other     onions  . Valium [Diazepam]   . Wellbutrin [Bupropion]     Code status- DNR  HPI 63 y/o patient is a new admission here. She was residing in her home prior to this. She has history of CVA, CAD, HTN, COPD, hypothyroidism, dm, hyperlipidemia on review She complaints of aches everywhere mostly in her hands. She has been constipated. She provides history of constipation and blood in stool in past. She has not noticed blood in stool in facility. Last bowel movement yesterday afternoon Foley has been removed. Has been able to urinate but has burning and pain with urination. No hematuria cbg mostly > 200. Non compliant with dietary restrictions  Review of Systems  Constitutional: Negative for fever, chills, malaise/fatigue.  Respiratory: Negative for cough and shortness of breath.   Cardiovascular: Negative for chest pain, palpitations and leg swelling.  Gastrointestinal: Negative for heartburn, nausea, vomiting and abdominal pain.  Musculoskeletal: Negative for joint pain and myalgias.  Skin: Negative.   Psychiatric/Behavioral: Negative for depression. The patient is not nervous/anxious.    Past Medical History  Diagnosis Date  . Stroke   . Hyperlipidemia   . Diabetes mellitus without complication   . COPD (chronic obstructive pulmonary disease)   . Depression   . Hypertension   . Thyroid disease   . Anemia   . CAD (coronary artery disease)   . Chronic pain   . Hemiplegia affecting non-dominant side, post-stroke   . Fall   . Urine retention    Past Surgical History  Procedure Laterality Date  . Joint replacement      bil hip  . Abdominal hysterectomy    . Cholecystectomy    . Appendectomy     . Tonsillectomy     Medication reviewed. See MAR  No family history on file.  History   Social History  . Marital Status: Married    Spouse Name: N/A    Number of Children: N/A  . Years of Education: N/A   Occupational History  . Not on file.   Social History Main Topics  . Smoking status: Former Smoker    Types: Cigarettes  . Smokeless tobacco: Not on file  . Alcohol Use: No  . Drug Use: No  . Sexual Activity: Not on file   Other Topics Concern  . Not on file   Social History Narrative  . No narrative on file   Physical exam BP 128/69  Pulse 82  Temp(Src) 98.7 F (37.1 C)  Resp 18  SpO2 95%  Constitutional: She appears well-developed and well-nourished. No distress.  Neck: Neck supple. No JVD present. No thyromegaly present.  Cardiovascular: Normal rate, regular rhythm and intact distal pulses.   Respiratory: Effort normal and breath sounds normal. No respiratory distress. She has no wheezes.  GI: Soft. Bowel sounds are normal. She exhibits no distension. There is no tenderness.  Musculoskeletal: left sided chronic hemiparesis.She exhibits no edema.   Neurological: She is alert.  Skin: Skin is warm and dry. She is not diaphoretic.  Psychiatric: She has a normal mood and affect.   Labs-none available for review   Assessment/ plan  Dysuria  Pyridium 100 mg bid x 3 days Send u/a with c/s  Chronic pain PT, OT Encourage to be out of bed Change oxycodone IR to 15 mg po every 6 hours for now and reassess for need for breakthrough pain regimen continue neurontin 600 mg three times daily and elavil 50 mg daily  CVA  with left hemiparesis. Currently stable. Fall precautions   CAD Remains chest pain free. Continue imdur 30 mg daily, cardizem cd 120 daily, crestor  Dm type 2 Check a1c. Monitor cbg. continue levemir 30 units daily and humalog 5 units prior to meals for cbg >=150 . Continue crestor and lovaza. Check lipid panel  Constipation On miralax  17 g daily and helping with bowel movement Guaiac stool x 1 negative, check another 2 sample  COPD continue tudorza 2 puffs daily and albuterol 1 puff every 6 hours as needed   Hypertension Controlled. Check bmp. continue cardizem cd 120 mg daily   Hypothyroidism continue synthroid 25 mcg daily and check tsh  Tobacco use continue nicotine patch   Anxiety continue klonopin 0.,5 every 6 hours as needed     Labs- cbc, cmp, tsh, a1c, flp  Family/ staff Communication: reviewed care plan with patient and nursing supervisor  Goals of care- possible LTC  [facility], MD  [place] (Monday-Friday 8 am - 5 pm) [phone] (afterhours)

## 2014-01-05 NOTE — Telephone Encounter (Signed)
[facility] 

## 2014-01-06 DIAGNOSIS — I2510 Atherosclerotic heart disease of native coronary artery without angina pectoris: Secondary | ICD-10-CM | POA: Diagnosis not present

## 2014-01-06 DIAGNOSIS — I69959 Hemiplegia and hemiparesis following unspecified cerebrovascular disease affecting unspecified side: Secondary | ICD-10-CM | POA: Diagnosis not present

## 2014-01-06 DIAGNOSIS — R269 Unspecified abnormalities of gait and mobility: Secondary | ICD-10-CM | POA: Diagnosis not present

## 2014-01-06 DIAGNOSIS — R7309 Other abnormal glucose: Secondary | ICD-10-CM | POA: Diagnosis not present

## 2014-01-06 DIAGNOSIS — E119 Type 2 diabetes mellitus without complications: Secondary | ICD-10-CM | POA: Diagnosis not present

## 2014-01-06 DIAGNOSIS — R279 Unspecified lack of coordination: Secondary | ICD-10-CM | POA: Diagnosis not present

## 2014-01-06 DIAGNOSIS — M6281 Muscle weakness (generalized): Secondary | ICD-10-CM | POA: Diagnosis not present

## 2014-01-06 LAB — HEPATIC FUNCTION PANEL
ALT: 31 U/L (ref 7–35)
AST: 18 U/L (ref 13–35)
Alkaline Phosphatase: 83 U/L (ref 25–125)

## 2014-01-06 LAB — BASIC METABOLIC PANEL
BUN: 22 mg/dL — AB (ref 4–21)
Creatinine: 0.5 mg/dL (ref 0.5–1.1)
GLUCOSE: 313 mg/dL
POTASSIUM: 4.2 mmol/L (ref 3.4–5.3)
Sodium: 137 mmol/L (ref 137–147)

## 2014-01-06 LAB — LIPID PANEL
Cholesterol: 166 mg/dL (ref 0–200)
LDL Cholesterol: 105 mg/dL
Triglycerides: 156 mg/dL (ref 40–160)

## 2014-01-06 LAB — CBC AND DIFFERENTIAL
HCT: 36 % (ref 36–46)
Hemoglobin: 10.8 g/dL — AB (ref 12.0–16.0)
Platelets: 319 10*3/uL (ref 150–399)
WBC: 9.4 10^3/mL

## 2014-01-06 LAB — TSH: TSH: 4.43 u[IU]/mL (ref 0.41–5.90)

## 2014-01-07 DIAGNOSIS — R269 Unspecified abnormalities of gait and mobility: Secondary | ICD-10-CM | POA: Diagnosis not present

## 2014-01-07 DIAGNOSIS — R279 Unspecified lack of coordination: Secondary | ICD-10-CM | POA: Diagnosis not present

## 2014-01-07 DIAGNOSIS — I69959 Hemiplegia and hemiparesis following unspecified cerebrovascular disease affecting unspecified side: Secondary | ICD-10-CM | POA: Diagnosis not present

## 2014-01-07 DIAGNOSIS — M6281 Muscle weakness (generalized): Secondary | ICD-10-CM | POA: Diagnosis not present

## 2014-01-08 DIAGNOSIS — R279 Unspecified lack of coordination: Secondary | ICD-10-CM | POA: Diagnosis not present

## 2014-01-08 DIAGNOSIS — R269 Unspecified abnormalities of gait and mobility: Secondary | ICD-10-CM | POA: Diagnosis not present

## 2014-01-08 DIAGNOSIS — M6281 Muscle weakness (generalized): Secondary | ICD-10-CM | POA: Diagnosis not present

## 2014-01-08 DIAGNOSIS — I69959 Hemiplegia and hemiparesis following unspecified cerebrovascular disease affecting unspecified side: Secondary | ICD-10-CM | POA: Diagnosis not present

## 2014-01-09 DIAGNOSIS — I69959 Hemiplegia and hemiparesis following unspecified cerebrovascular disease affecting unspecified side: Secondary | ICD-10-CM | POA: Diagnosis not present

## 2014-01-09 DIAGNOSIS — R269 Unspecified abnormalities of gait and mobility: Secondary | ICD-10-CM | POA: Diagnosis not present

## 2014-01-09 DIAGNOSIS — M6281 Muscle weakness (generalized): Secondary | ICD-10-CM | POA: Diagnosis not present

## 2014-01-09 DIAGNOSIS — R279 Unspecified lack of coordination: Secondary | ICD-10-CM | POA: Diagnosis not present

## 2014-01-12 DIAGNOSIS — R269 Unspecified abnormalities of gait and mobility: Secondary | ICD-10-CM | POA: Diagnosis not present

## 2014-01-12 DIAGNOSIS — R279 Unspecified lack of coordination: Secondary | ICD-10-CM | POA: Diagnosis not present

## 2014-01-12 DIAGNOSIS — I69959 Hemiplegia and hemiparesis following unspecified cerebrovascular disease affecting unspecified side: Secondary | ICD-10-CM | POA: Diagnosis not present

## 2014-01-12 DIAGNOSIS — M6281 Muscle weakness (generalized): Secondary | ICD-10-CM | POA: Diagnosis not present

## 2014-01-13 DIAGNOSIS — R279 Unspecified lack of coordination: Secondary | ICD-10-CM | POA: Diagnosis not present

## 2014-01-13 DIAGNOSIS — M6281 Muscle weakness (generalized): Secondary | ICD-10-CM | POA: Diagnosis not present

## 2014-01-13 DIAGNOSIS — R269 Unspecified abnormalities of gait and mobility: Secondary | ICD-10-CM | POA: Diagnosis not present

## 2014-01-13 DIAGNOSIS — I69959 Hemiplegia and hemiparesis following unspecified cerebrovascular disease affecting unspecified side: Secondary | ICD-10-CM | POA: Diagnosis not present

## 2014-01-14 ENCOUNTER — Non-Acute Institutional Stay (SKILLED_NURSING_FACILITY): Payer: Medicare Other | Admitting: Adult Health

## 2014-01-14 DIAGNOSIS — M6281 Muscle weakness (generalized): Secondary | ICD-10-CM | POA: Diagnosis not present

## 2014-01-14 DIAGNOSIS — E1165 Type 2 diabetes mellitus with hyperglycemia: Secondary | ICD-10-CM

## 2014-01-14 DIAGNOSIS — M792 Neuralgia and neuritis, unspecified: Secondary | ICD-10-CM

## 2014-01-14 DIAGNOSIS — F411 Generalized anxiety disorder: Secondary | ICD-10-CM

## 2014-01-14 DIAGNOSIS — E1149 Type 2 diabetes mellitus with other diabetic neurological complication: Secondary | ICD-10-CM | POA: Diagnosis not present

## 2014-01-14 DIAGNOSIS — R269 Unspecified abnormalities of gait and mobility: Secondary | ICD-10-CM | POA: Diagnosis not present

## 2014-01-14 DIAGNOSIS — IMO0002 Reserved for concepts with insufficient information to code with codable children: Secondary | ICD-10-CM | POA: Diagnosis not present

## 2014-01-14 DIAGNOSIS — R279 Unspecified lack of coordination: Secondary | ICD-10-CM | POA: Diagnosis not present

## 2014-01-14 DIAGNOSIS — I69959 Hemiplegia and hemiparesis following unspecified cerebrovascular disease affecting unspecified side: Secondary | ICD-10-CM | POA: Diagnosis not present

## 2014-01-14 DIAGNOSIS — F419 Anxiety disorder, unspecified: Secondary | ICD-10-CM

## 2014-01-15 DIAGNOSIS — R279 Unspecified lack of coordination: Secondary | ICD-10-CM | POA: Diagnosis not present

## 2014-01-15 DIAGNOSIS — I69959 Hemiplegia and hemiparesis following unspecified cerebrovascular disease affecting unspecified side: Secondary | ICD-10-CM | POA: Diagnosis not present

## 2014-01-15 DIAGNOSIS — R269 Unspecified abnormalities of gait and mobility: Secondary | ICD-10-CM | POA: Diagnosis not present

## 2014-01-15 DIAGNOSIS — M6281 Muscle weakness (generalized): Secondary | ICD-10-CM | POA: Diagnosis not present

## 2014-01-16 DIAGNOSIS — R279 Unspecified lack of coordination: Secondary | ICD-10-CM | POA: Diagnosis not present

## 2014-01-16 DIAGNOSIS — G4700 Insomnia, unspecified: Secondary | ICD-10-CM | POA: Diagnosis not present

## 2014-01-16 DIAGNOSIS — F411 Generalized anxiety disorder: Secondary | ICD-10-CM | POA: Diagnosis not present

## 2014-01-16 DIAGNOSIS — M6281 Muscle weakness (generalized): Secondary | ICD-10-CM | POA: Diagnosis not present

## 2014-01-16 DIAGNOSIS — R269 Unspecified abnormalities of gait and mobility: Secondary | ICD-10-CM | POA: Diagnosis not present

## 2014-01-16 DIAGNOSIS — F39 Unspecified mood [affective] disorder: Secondary | ICD-10-CM | POA: Diagnosis not present

## 2014-01-16 DIAGNOSIS — F3289 Other specified depressive episodes: Secondary | ICD-10-CM | POA: Diagnosis not present

## 2014-01-16 DIAGNOSIS — F329 Major depressive disorder, single episode, unspecified: Secondary | ICD-10-CM | POA: Diagnosis not present

## 2014-01-16 DIAGNOSIS — I69959 Hemiplegia and hemiparesis following unspecified cerebrovascular disease affecting unspecified side: Secondary | ICD-10-CM | POA: Diagnosis not present

## 2014-01-19 DIAGNOSIS — I69959 Hemiplegia and hemiparesis following unspecified cerebrovascular disease affecting unspecified side: Secondary | ICD-10-CM | POA: Diagnosis not present

## 2014-01-19 DIAGNOSIS — R269 Unspecified abnormalities of gait and mobility: Secondary | ICD-10-CM | POA: Diagnosis not present

## 2014-01-19 DIAGNOSIS — R279 Unspecified lack of coordination: Secondary | ICD-10-CM | POA: Diagnosis not present

## 2014-01-19 DIAGNOSIS — M6281 Muscle weakness (generalized): Secondary | ICD-10-CM | POA: Diagnosis not present

## 2014-01-20 DIAGNOSIS — I69959 Hemiplegia and hemiparesis following unspecified cerebrovascular disease affecting unspecified side: Secondary | ICD-10-CM | POA: Diagnosis not present

## 2014-01-20 DIAGNOSIS — R269 Unspecified abnormalities of gait and mobility: Secondary | ICD-10-CM | POA: Diagnosis not present

## 2014-01-20 DIAGNOSIS — M6281 Muscle weakness (generalized): Secondary | ICD-10-CM | POA: Diagnosis not present

## 2014-01-20 DIAGNOSIS — R279 Unspecified lack of coordination: Secondary | ICD-10-CM | POA: Diagnosis not present

## 2014-01-21 DIAGNOSIS — M6281 Muscle weakness (generalized): Secondary | ICD-10-CM | POA: Diagnosis not present

## 2014-01-21 DIAGNOSIS — R269 Unspecified abnormalities of gait and mobility: Secondary | ICD-10-CM | POA: Diagnosis not present

## 2014-01-21 DIAGNOSIS — I69959 Hemiplegia and hemiparesis following unspecified cerebrovascular disease affecting unspecified side: Secondary | ICD-10-CM | POA: Diagnosis not present

## 2014-01-21 DIAGNOSIS — R279 Unspecified lack of coordination: Secondary | ICD-10-CM | POA: Diagnosis not present

## 2014-01-22 DIAGNOSIS — M6281 Muscle weakness (generalized): Secondary | ICD-10-CM | POA: Diagnosis not present

## 2014-01-22 DIAGNOSIS — I69959 Hemiplegia and hemiparesis following unspecified cerebrovascular disease affecting unspecified side: Secondary | ICD-10-CM | POA: Diagnosis not present

## 2014-01-22 DIAGNOSIS — R279 Unspecified lack of coordination: Secondary | ICD-10-CM | POA: Diagnosis not present

## 2014-01-22 DIAGNOSIS — R269 Unspecified abnormalities of gait and mobility: Secondary | ICD-10-CM | POA: Diagnosis not present

## 2014-01-24 ENCOUNTER — Encounter: Payer: Self-pay | Admitting: Adult Health

## 2014-01-24 MED ORDER — DULOXETINE HCL 60 MG PO CPEP: 60.0000 mg | ORAL_CAPSULE | Freq: Every day | ORAL | Status: DC

## 2014-01-24 MED ORDER — INSULIN DETEMIR 100 UNIT/ML ~~LOC~~ SOLN: 35.0000 [IU] | Freq: Every day | SUBCUTANEOUS | Status: DC

## 2014-01-24 MED ORDER — INSULIN LISPRO 100 UNIT/ML ~~LOC~~ SOLN: 5.0000 [IU] | Freq: Three times a day (TID) | SUBCUTANEOUS | Status: DC

## 2014-01-24 NOTE — Progress Notes (Signed)
Patient ID: Susan [name], female   DOB: 1951/01/16, 63 y.o.   MRN: [id number]     [facility]  Allergies  Allergen Reactions  . Niaspan [Niacin Er]   . Valium [Diazepam]   . Wellbutrin [Bupropion]      Chief Complaint  Patient presents with  . Acute Visit    follow up labs     HPI:  Her cbg's are elevated. She is having pain all the time. She is anxious as well. Her cbg's are elevated and her fasting glucose was over 300. She will require her diabetes medications to be adjusted. She would like her pain medication to be adjusted she states she has migraines and im demerol and phenergan works for her.    Past Medical History  Diagnosis Date  . Stroke   . Hyperlipidemia   . Diabetes mellitus without complication   . COPD (chronic obstructive pulmonary disease)   . Depression   . Hypertension   . Thyroid disease   . Anemia   . CAD (coronary artery disease)   . Chronic pain   . Hemiplegia affecting non-dominant side, post-stroke   . Fall   . Urine retention     No past surgical history on file.  VITAL SIGNS BP 112/62  Pulse 70  Ht 5\' 3"  (1.6 m)  Wt 147 lb (66.679 kg)  BMI 26.05 kg/m2   Patient's Medications  New Prescriptions   No medications on file  Previous Medications   ACLIDINIUM BROMIDE (TUDORZA PRESSAIR IN)    Inhale 2 puffs into the lungs daily.   ALBUTEROL (PROVENTIL HFA;VENTOLIN HFA) 108 (90 BASE) MCG/ACT INHALER    Inhale 1 puff into the lungs every 6 (six) hours as needed for wheezing or shortness of breath.   AMITRIPTYLINE (ELAVIL) 50 MG TABLET    Take 50 mg by mouth at bedtime.   CLONAZEPAM (KLONOPIN) 0.5 MG TABLET    Take 0.5 mg by mouth every 6 (six) hours as needed for anxiety.   DICLOFENAC SODIUM (VOLTAREN) 1 % GEL    Apply 2 g topically at bedtime. To both feet   DILTIAZEM (CARDIZEM CD) 120 MG 24 HR CAPSULE    Take 120 mg by mouth daily.   GABAPENTIN (NEURONTIN) 600 MG TABLET    Take 600 mg by mouth 3 (three) times daily.   INSULIN DETEMIR  (LEVEMIR) 100 UNIT/ML INJECTION    Inject 30 Units into the skin at bedtime.   INSULIN LISPRO (HUMALOG) 100 UNIT/ML INJECTION    Inject 5 Units into the skin 3 (three) times daily before meals. For cbg >=150   ISOSORBIDE MONONITRATE (IMDUR) 30 MG 24 HR TABLET    Take 30 mg by mouth daily.   LEVOTHYROXINE (SYNTHROID, LEVOTHROID) 25 MCG TABLET    Take 25 mcg by mouth daily before breakfast.   NICOTINE (NICODERM CQ - DOSED IN MG/24 HOURS) 21 MG/24HR PATCH    Place 21 mg onto the skin daily.   OMEGA-3 ACID ETHYL ESTERS (LOVAZA) 1 G CAPSULE    Take 2 g by mouth daily.   OXYCODONE (ROXICODONE) 15 MG IMMEDIATE RELEASE TABLET    Take one tablet by mouth every six hours for chronic pain syndrome   POLYETHYLENE GLYCOL POWDER (GLYCOLAX/MIRALAX) POWDER    Take 17 g by mouth daily.   ROSUVASTATIN (CRESTOR) 40 MG TABLET    Take 40 mg by mouth daily.  Modified Medications   No medications on file  Discontinued Medications   No medications on file  SIGNIFICANT DIAGNOSTIC EXAMS  LABS REVIEWED:   01-05-14: urine culture: no growth 01-06-14: wbc 9.4; hgb 10.8; hct 35.5; mcv 98.3 plt 319; glucose 313; bun 33; creat 0.5; k+4.2; na++137 liver normal albumin 3.0 chol 166; ldl 104; trig 156; tsh 4.429     Review of Systems  Constitutional: Negative for malaise/fatigue.  Respiratory: Negative for cough and shortness of breath.   Cardiovascular: Negative for chest pain, palpitations and leg swelling.  Gastrointestinal:. Negative for heartburn and abdominal pain.  Musculoskeletal: has generalized pain in joints and muscles   Skin: Negative.   Psychiatric/Behavioral: Negative for depression. The patient is not nervous/anxious.      Physical Exam  Constitutional: She appears well-developed and well-nourished. No distress.  Neck: Neck supple. No JVD present. No thyromegaly present.  Cardiovascular: Normal rate, regular rhythm and intact distal pulses.   Respiratory: Effort normal and breath sounds normal.  No respiratory distress. She has no wheezes.  GI: Soft. Bowel sounds are normal. She exhibits no distension. There is no tenderness.  Musculoskeletal: She exhibits no edema.  Has left side hemiparesis present   Neurological: She is alert.  Skin: Skin is warm and dry. She is not diaphoretic.  Psychiatric: She has a normal mood and affect.     ASSESSMENT/ PLAN:   2. Diabetes: due ot her elevated cbg's will increase her levemir to 25 units nightly and will change her humalog to 5 units prior to meals with an additional 5 units for cbg >=150 will monitor    9. Chronic neuropathic pain: will continue neurontin 600 mg three times daily oxycodone 15 mg every 6 hours as needed and elavil 50 mg nightly  Will begin cymbalta 30 mg daily for one week then increase to 60 mg daily for pain management and anxiety will monitor   10. Anxiety: is  Not well controlled at this time; will continue klonopin 0.,5 every 6 hours as needed  Will begin cymbalta 30 mg daily for one week then increase to 60 mg daily will monitor     Ok Edwards NP [facility]  Contact [phone] Monday through Friday 8am- 5pm  After hours call [phone]

## 2014-01-26 ENCOUNTER — Non-Acute Institutional Stay (SKILLED_NURSING_FACILITY): Payer: Medicare Other | Admitting: Adult Health

## 2014-01-26 DIAGNOSIS — G4700 Insomnia, unspecified: Secondary | ICD-10-CM

## 2014-01-26 DIAGNOSIS — E1149 Type 2 diabetes mellitus with other diabetic neurological complication: Secondary | ICD-10-CM

## 2014-01-26 DIAGNOSIS — E785 Hyperlipidemia, unspecified: Secondary | ICD-10-CM

## 2014-01-26 DIAGNOSIS — F411 Generalized anxiety disorder: Secondary | ICD-10-CM

## 2014-01-26 DIAGNOSIS — I2510 Atherosclerotic heart disease of native coronary artery without angina pectoris: Secondary | ICD-10-CM | POA: Diagnosis not present

## 2014-01-26 DIAGNOSIS — E039 Hypothyroidism, unspecified: Secondary | ICD-10-CM

## 2014-01-26 DIAGNOSIS — J449 Chronic obstructive pulmonary disease, unspecified: Secondary | ICD-10-CM | POA: Diagnosis not present

## 2014-01-26 DIAGNOSIS — IMO0002 Reserved for concepts with insufficient information to code with codable children: Secondary | ICD-10-CM

## 2014-01-26 DIAGNOSIS — I69354 Hemiplegia and hemiparesis following cerebral infarction affecting left non-dominant side: Secondary | ICD-10-CM

## 2014-01-26 DIAGNOSIS — M792 Neuralgia and neuritis, unspecified: Secondary | ICD-10-CM

## 2014-01-26 DIAGNOSIS — I69959 Hemiplegia and hemiparesis following unspecified cerebrovascular disease affecting unspecified side: Secondary | ICD-10-CM

## 2014-01-26 DIAGNOSIS — I6350 Cerebral infarction due to unspecified occlusion or stenosis of unspecified cerebral artery: Secondary | ICD-10-CM

## 2014-01-26 DIAGNOSIS — I639 Cerebral infarction, unspecified: Secondary | ICD-10-CM

## 2014-01-26 DIAGNOSIS — F419 Anxiety disorder, unspecified: Secondary | ICD-10-CM

## 2014-01-26 DIAGNOSIS — I10 Essential (primary) hypertension: Secondary | ICD-10-CM

## 2014-01-26 DIAGNOSIS — E1165 Type 2 diabetes mellitus with hyperglycemia: Secondary | ICD-10-CM

## 2014-01-28 ENCOUNTER — Non-Acute Institutional Stay (SKILLED_NURSING_FACILITY): Payer: Medicare Other | Admitting: Adult Health

## 2014-01-28 DIAGNOSIS — M792 Neuralgia and neuritis, unspecified: Secondary | ICD-10-CM

## 2014-01-28 DIAGNOSIS — IMO0002 Reserved for concepts with insufficient information to code with codable children: Secondary | ICD-10-CM | POA: Diagnosis not present

## 2014-01-28 DIAGNOSIS — G4700 Insomnia, unspecified: Secondary | ICD-10-CM

## 2014-01-28 DIAGNOSIS — R609 Edema, unspecified: Secondary | ICD-10-CM

## 2014-02-02 ENCOUNTER — Encounter: Payer: Self-pay | Admitting: Adult Health

## 2014-02-02 DIAGNOSIS — G4700 Insomnia, unspecified: Secondary | ICD-10-CM | POA: Insufficient documentation

## 2014-02-02 MED ORDER — MIRTAZAPINE 15 MG PO TABS: 15.0000 mg | ORAL_TABLET | Freq: Every day | ORAL | Status: DC

## 2014-02-02 NOTE — Progress Notes (Signed)
Patient ID: Susan [name], female   DOB: June 11, 1951, 63 y.o.   MRN: [id number]    [facility]  Allergies  Allergen Reactions  . Niaspan [Niacin Er]   . Valium [Diazepam]   . Wellbutrin [Bupropion]      Chief Complaint  Patient presents with  . Medical Management of Chronic Issues    HPI:  She is being seen for the management of her chronic illnesses. There are no concerns being voiced by the nursing staff at this time. She is not as obsessed with her pain and body issues since starting the cymbalta. Today she is not complaining of pain states that she knows she will always have pain; but states that her pain is manageable.   Past Medical History  Diagnosis Date  . Stroke   . Hyperlipidemia   . Diabetes mellitus without complication   . COPD (chronic obstructive pulmonary disease)   . Depression   . Hypertension   . Thyroid disease   . Anemia   . CAD (coronary artery disease)   . Chronic pain   . Hemiplegia affecting non-dominant side, post-stroke   . Fall   . Urine retention     No past surgical history on file.  VITAL SIGNS BP 139/67  Pulse 76  Ht 5\' 3"  (1.6 m)  Wt 166 lb (75.297 kg)  BMI 29.41 kg/m2   Patient's Medications  New Prescriptions   No medications on file  Previous Medications   ACLIDINIUM BROMIDE (TUDORZA PRESSAIR IN)    Inhale 2 puffs into the lungs daily.   ALBUTEROL (PROVENTIL HFA;VENTOLIN HFA) 108 (90 BASE) MCG/ACT INHALER    Inhale 1 puff into the lungs every 6 (six) hours as needed for wheezing or shortness of breath.   AMITRIPTYLINE (ELAVIL) 50 MG TABLET    Take 50 mg by mouth at bedtime.   CLONAZEPAM (KLONOPIN) 0.5 MG TABLET    Take 0.5 mg by mouth every 6 (six) hours as needed for anxiety.   DICLOFENAC SODIUM (VOLTAREN) 1 % GEL    Apply 2 g topically at bedtime. To both feet   DILTIAZEM (CARDIZEM CD) 120 MG 24 HR CAPSULE    Take 120 mg by mouth daily.   DULOXETINE (CYMBALTA) 60 MG CAPSULE    Take 1 capsule (60 mg total) by mouth daily.   GABAPENTIN (NEURONTIN) 600 MG TABLET    Take 600 mg by mouth 3 (three) times daily.   INSULIN DETEMIR (LEVEMIR) 100 UNIT/ML INJECTION    Inject 0.35 mLs (35 Units total) into the skin at bedtime.   INSULIN LISPRO (HUMALOG) 100 UNIT/ML INJECTION    Inject 0.05 mLs (5 Units total) into the skin 3 (three) times daily before meals. 5 units prior to meals with an additional 5 units for cbg>=150   ISOSORBIDE MONONITRATE (IMDUR) 30 MG 24 HR TABLET    Take 30 mg by mouth daily.   LEVOTHYROXINE (SYNTHROID, LEVOTHROID) 25 MCG TABLET    Take 25 mcg by mouth daily before breakfast.   MELATONIN 3 MG TABS    Take 6 mg by mouth at bedtime.   NICOTINE (NICODERM CQ - DOSED IN MG/24 HOURS) 21 MG/24HR PATCH    Place 21 mg onto the skin daily.   OMEGA-3 ACID ETHYL ESTERS (LOVAZA) 1 G CAPSULE    Take 2 g by mouth daily.   OXYCODONE (ROXICODONE) 15 MG IMMEDIATE RELEASE TABLET    Take one tablet by mouth every six hours for chronic pain syndrome   POLYETHYLENE  GLYCOL POWDER (GLYCOLAX/MIRALAX) POWDER    Take 17 g by mouth daily.   ROSUVASTATIN (CRESTOR) 40 MG TABLET    Take 40 mg by mouth daily.  Modified Medications   No medications on file  Discontinued Medications   No medications on file    SIGNIFICANT DIAGNOSTIC EXAMS  LABS REVIEWED:   01-05-14: urine culture: no growth 01-06-14: wbc 9.4; hgb 10.8; hct 35.5; mcv 98.3 plt 319; glucose 313; bun 33; creat 0.5; k+4.2; na++137 liver normal albumin 3.0 chol 166; ldl 104; trig 156; tsh 4.429  hgb a1c 9.3     Review of Systems  Constitutional: Negative for malaise/fatigue.  Respiratory: Negative for cough and shortness of breath.   Cardiovascular: Negative for chest pain, palpitations  Has edema   Gastrointestinal:. Negative for heartburn and abdominal pain.  Musculoskeletal: has generalized pain in joints and muscles   Skin: Negative.   Psychiatric/Behavioral: Negative for depression. The patient is not nervous/anxious. Insomnia      Physical Exam    Constitutional: She appears well-developed and well-nourished. No distress.  Neck: Neck supple. No JVD present. No thyromegaly present.  Cardiovascular: Normal rate, regular rhythm and intact distal pulses.   Respiratory: Effort normal and breath sounds normal. No respiratory distress. She has no wheezes.  GI: Soft. Bowel sounds are normal. She exhibits no distension. There is no tenderness.  Musculoskeletal: has trace right ankle edema   Has left side hemiparesis present   Neurological: She is alert.  Skin: Skin is warm and dry. She is not diaphoretic.  Psychiatric: She has a normal mood and affect.      ASSESSMENT/ PLAN:  1. Diabetes: is stable will continue levemir 35 units nightly; and humalog 5 units prior to meals with an additional 5 units for cbg>=150  2. COPD: is stable will continue tudorza 2 puffs daily; and albuterol 1 puff every 6 hours as needed. Will monitor  3. CAD; no complaints of chest pain present; will continue imdur 30 mg daily; and will ,monitor  4. Hypertension: is stable will continue cardizem cd 120 mg daily and will monitor  5. Dyslipidemia: will continue crestor 40 mg daily and lovaza 2 gm daily  6. Hypothyroidism: will continue synthroid 25 mcg daily  7. Neuropathic pain syndrome: her feelings toward her pain has improved; will continue cymbalta 60 mg daily; elavil 50 mg nightly; neurontin 600 mg three times daily; oxycodone 15 mg every 6 hours routinely; and voltaren gel 2 gm to feet nightly and will continue to monitor her status  8. Anxiety; will continue  cymbalta 60 mg daily and klonopin 0.5 mg every 6 hours as needed and will monitor   9. Insomnia: is complaining of poor sleep; will continue melatonin 6 mg nighlty and will start remeron 15 mg nightly and will monitor   10. Smoker: is not complaining of inability to get to smoke; will continue nicotine patch 21 mg daily

## 2014-02-03 DIAGNOSIS — F39 Unspecified mood [affective] disorder: Secondary | ICD-10-CM | POA: Diagnosis not present

## 2014-02-03 DIAGNOSIS — F329 Major depressive disorder, single episode, unspecified: Secondary | ICD-10-CM | POA: Diagnosis not present

## 2014-02-03 DIAGNOSIS — F3289 Other specified depressive episodes: Secondary | ICD-10-CM | POA: Diagnosis not present

## 2014-02-03 DIAGNOSIS — G4700 Insomnia, unspecified: Secondary | ICD-10-CM | POA: Diagnosis not present

## 2014-02-03 DIAGNOSIS — F411 Generalized anxiety disorder: Secondary | ICD-10-CM | POA: Diagnosis not present

## 2014-02-06 ENCOUNTER — Encounter: Payer: Self-pay | Admitting: Adult Health

## 2014-02-06 ENCOUNTER — Non-Acute Institutional Stay (SKILLED_NURSING_FACILITY): Payer: Medicare Other | Admitting: Adult Health

## 2014-02-06 DIAGNOSIS — R609 Edema, unspecified: Secondary | ICD-10-CM | POA: Insufficient documentation

## 2014-02-06 DIAGNOSIS — G9050 Complex regional pain syndrome I, unspecified: Secondary | ICD-10-CM | POA: Diagnosis not present

## 2014-02-06 MED ORDER — AMITRIPTYLINE HCL 75 MG PO TABS: 75.0000 mg | ORAL_TABLET | Freq: Every day | ORAL | Status: DC

## 2014-02-06 MED ORDER — FUROSEMIDE 20 MG PO TABS: 20.0000 mg | ORAL_TABLET | Freq: Every day | ORAL | Status: DC

## 2014-02-06 NOTE — Progress Notes (Signed)
Patient ID: Susan [name], female   DOB: Oct 14, 1950, 63 y.o.   MRN: [id number]     [facility]  Allergies  Allergen Reactions  . Niaspan [Niacin Er]   . Valium [Diazepam]   . Wellbutrin [Bupropion]      Chief Complaint  Patient presents with  . Acute Visit    pain management     HPI:  She has chronic pain issues; she has constant pain which is not being adequately relieved. She would like to have her pain medication adjusted in order to better meet her needs. She might be interested in a long acting pain medication in the future such as ms contin or oxycontin.  Her daughter has recently been diagnosed with lupus. She is concerned about her having this as she has been diagnosed with RSD.   Past Medical History  Diagnosis Date  . Stroke   . Hyperlipidemia   . Diabetes mellitus without complication   . COPD (chronic obstructive pulmonary disease)   . Depression   . Hypertension   . Thyroid disease   . Anemia   . CAD (coronary artery disease)   . Chronic pain   . Hemiplegia affecting non-dominant side, post-stroke   . Fall   . Urine retention     No past surgical history on file.  VITAL SIGNS BP 121/69  Pulse 80  Ht 5\' 3"  (1.6 m)  Wt 181 lb 9.6 oz (82.373 kg)  BMI 32.18 kg/m2  SpO2 95%   Patient's Medications  New Prescriptions   No medications on file  Previous Medications   ACLIDINIUM BROMIDE (TUDORZA PRESSAIR IN)    Inhale 2 puffs into the lungs daily.   ALBUTEROL (PROVENTIL HFA;VENTOLIN HFA) 108 (90 BASE) MCG/ACT INHALER    Inhale 1 puff into the lungs every 6 (six) hours as needed for wheezing or shortness of breath.   AMITRIPTYLINE (ELAVIL) 75 MG TABLET    Take 1 tablet (75 mg total) by mouth at bedtime.   CLONAZEPAM (KLONOPIN) 0.5 MG TABLET    Take 0.5 mg by mouth every 6 (six) hours as needed for anxiety.   DICLOFENAC SODIUM (VOLTAREN) 1 % GEL    Apply 2 g topically at bedtime. To both feet   DILTIAZEM (CARDIZEM CD) 120 MG 24 HR CAPSULE    Take 120 mg by  mouth daily.   DULOXETINE (CYMBALTA) 60 MG CAPSULE    Take 1 capsule (60 mg total) by mouth daily.   FUROSEMIDE (LASIX) 20 MG TABLET    Take 1 tablet (20 mg total) by mouth daily.   GABAPENTIN (NEURONTIN) 600 MG TABLET    Take 600 mg by mouth 3 (three) times daily.   INSULIN DETEMIR (LEVEMIR) 100 UNIT/ML INJECTION    Inject 0.35 mLs (35 Units total) into the skin at bedtime.   INSULIN LISPRO (HUMALOG) 100 UNIT/ML INJECTION    Inject 0.05 mLs (5 Units total) into the skin 3 (three) times daily before meals. 5 units prior to meals with an additional 5 units for cbg>=150   ISOSORBIDE MONONITRATE (IMDUR) 30 MG 24 HR TABLET    Take 30 mg by mouth daily.   LEVOTHYROXINE (SYNTHROID, LEVOTHROID) 25 MCG TABLET    Take 25 mcg by mouth daily before breakfast.   MELATONIN 3 MG TABS    Take 6 mg by mouth at bedtime.   NICOTINE (NICODERM CQ - DOSED IN MG/24 HOURS) 21 MG/24HR PATCH    Place 21 mg onto the skin daily.   OMEGA-3  ACID ETHYL ESTERS (LOVAZA) 1 G CAPSULE    Take 2 g by mouth daily.   OXYCODONE (ROXICODONE) 15 MG IMMEDIATE RELEASE TABLET    Take one tablet by mouth every six hours for chronic pain syndrome   POLYETHYLENE GLYCOL POWDER (GLYCOLAX/MIRALAX) POWDER    Take 17 g by mouth daily.   ROSUVASTATIN (CRESTOR) 40 MG TABLET    Take 40 mg by mouth daily.  Modified Medications   No medications on file  Discontinued Medications   No medications on file    SIGNIFICANT DIAGNOSTIC EXAMS  LABS REVIEWED:   01-05-14: urine culture: no growth 01-06-14: wbc 9.4; hgb 10.8; hct 35.5; mcv 98.3 plt 319; glucose 313; bun 33; creat 0.5; k+4.2; na++137 liver normal albumin 3.0 chol 166; ldl 104; trig 156; tsh 4.429  hgb a1c 9.3      Review of Systems  Constitutional: Negative for malaise/fatigue.  Respiratory: Negative for cough and shortness of breath.   Cardiovascular: Negative for chest pain, palpitations  Has edema   Gastrointestinal:. Negative for heartburn and abdominal pain.  Musculoskeletal: has  generalized pain in joints and muscles   Skin: Negative.   Psychiatric/Behavioral: Negative for depression. The patient is not nervous/anxious.      Physical Exam  Constitutional: She appears well-developed and well-nourished. No distress.  Neck: Neck supple. No JVD present. No thyromegaly present.  Cardiovascular: Normal rate, regular rhythm and intact distal pulses.   Respiratory: Effort normal and breath sounds normal. No respiratory distress. She has no wheezes.  GI: Soft. Bowel sounds are normal. She exhibits no distension. There is no tenderness.  Musculoskeletal: has trace ankle edema   Has left side hemiparesis present   Neurological: She is alert.  Skin: Skin is warm and dry. She is not diaphoretic.  Psychiatric: She has a normal mood and affect.     ASSESSMENT/ PLAN:  1. Neuropathic pain syndrome: will change her oxycodone to 15 mg every 4 hours routinely at this time. Will continue to monitor her status.   Will check ana; antiphospholipid antidna

## 2014-02-06 NOTE — Progress Notes (Signed)
Patient ID: Susan [name], female   DOB: Jul 12, 1951, 63 y.o.   MRN: [id number]    Susan [name]  Allergies  Allergen Reactions  . Niaspan [Niacin Er]   . Valium [Diazepam]   . Wellbutrin [Bupropion]      Chief Complaint  Patient presents with  . Acute Visit    staff concerns     HPI:  She has gained weight from 160 pounds on 01-13-14 to 181.4 pounds. Her appetite is excellent and she is eating snacks as well. She does not appear to fluid overloaded. She is not short of breath no chest pain and is not coughing.   Past Medical History  Diagnosis Date  . Stroke   . Hyperlipidemia   . Diabetes mellitus without complication   . COPD (chronic obstructive pulmonary disease)   . Depression   . Hypertension   . Thyroid disease   . Anemia   . CAD (coronary artery disease)   . Chronic pain   . Hemiplegia affecting non-dominant side, post-stroke   . Fall   . Urine retention     No past surgical history on file.  VITAL SIGNS BP 118/56  Pulse 74  Ht 5\' 3"  (1.6 m)  Wt 181 lb 6.4 oz (82.283 kg)  BMI 32.14 kg/m2  SpO2 93%   Patient's Medications  New Prescriptions   No medications on file  Previous Medications   ACLIDINIUM BROMIDE (TUDORZA PRESSAIR IN)    Inhale 2 puffs into the lungs daily.   ALBUTEROL (PROVENTIL HFA;VENTOLIN HFA) 108 (90 BASE) MCG/ACT INHALER    Inhale 1 puff into the lungs every 6 (six) hours as needed for wheezing or shortness of breath.   AMITRIPTYLINE (ELAVIL) 50 MG TABLET    Take 50 mg by mouth at bedtime.   CLONAZEPAM (KLONOPIN) 0.5 MG TABLET    Take 0.5 mg by mouth every 6 (six) hours as needed for anxiety.   DICLOFENAC SODIUM (VOLTAREN) 1 % GEL    Apply 2 g topically at bedtime. To both feet   DILTIAZEM (CARDIZEM CD) 120 MG 24 HR CAPSULE    Take 120 mg by mouth daily.   DULOXETINE (CYMBALTA) 60 MG CAPSULE    Take 1 capsule (60 mg total) by mouth daily.   GABAPENTIN (NEURONTIN) 600 MG TABLET    Take 600 mg by mouth 3 (three) times daily.   INSULIN  DETEMIR (LEVEMIR) 100 UNIT/ML INJECTION    Inject 0.35 mLs (35 Units total) into the skin at bedtime.   INSULIN LISPRO (HUMALOG) 100 UNIT/ML INJECTION    Inject 0.05 mLs (5 Units total) into the skin 3 (three) times daily before meals. 5 units prior to meals with an additional 5 units for cbg>=150   ISOSORBIDE MONONITRATE (IMDUR) 30 MG 24 HR TABLET    Take 30 mg by mouth daily.   LEVOTHYROXINE (SYNTHROID, LEVOTHROID) 25 MCG TABLET    Take 25 mcg by mouth daily before breakfast.   MELATONIN 3 MG TABS    Take 6 mg by mouth at bedtime.   MIRTAZAPINE (REMERON) 15 MG TABLET    Take 1 tablet (15 mg total) by mouth at bedtime.   NICOTINE (NICODERM CQ - DOSED IN MG/24 HOURS) 21 MG/24HR PATCH    [name] 21 mg onto the skin daily.   OMEGA-3 ACID ETHYL ESTERS (LOVAZA) 1 G CAPSULE    Take 2 g by mouth daily.   OXYCODONE (ROXICODONE) 15 MG IMMEDIATE RELEASE TABLET    Take one tablet by mouth  every six hours for chronic pain syndrome   POLYETHYLENE GLYCOL POWDER (GLYCOLAX/MIRALAX) POWDER    Take 17 g by mouth daily.   ROSUVASTATIN (CRESTOR) 40 MG TABLET    Take 40 mg by mouth daily.  Modified Medications   No medications on file  Discontinued Medications   No medications on file    SIGNIFICANT DIAGNOSTIC EXAMS   LABS REVIEWED:   01-05-14: urine culture: no growth 01-06-14: wbc 9.4; hgb 10.8; hct 35.5; mcv 98.3 plt 319; glucose 313; bun 33; creat 0.5; k+4.2; na++137 liver normal albumin 3.0 chol 166; ldl 104; trig 156; tsh 4.429  hgb a1c 9.3     Review of Systems  Constitutional: Negative for malaise/fatigue.  Respiratory: Negative for cough and shortness of breath.   Cardiovascular: Negative for chest pain, palpitations  Has edema   Gastrointestinal:. Negative for heartburn and abdominal pain.  Musculoskeletal: has generalized pain in joints and muscles   Skin: Negative.   Psychiatric/Behavioral: Negative for depression. The patient is not nervous/anxious. Insomnia      Physical Exam    Constitutional: She appears well-developed and well-nourished. No distress.  Neck: Neck supple. No JVD present. No thyromegaly present.  Cardiovascular: Normal rate, regular rhythm and intact distal pulses.   Respiratory: Effort normal and breath sounds normal. No respiratory distress. She has no wheezes.  GI: Soft. Bowel sounds are normal. She exhibits no distension. There is no tenderness.  Musculoskeletal: has trace to 1+  ankle edema   Has left side hemiparesis present   Neurological: She is alert.  Skin: Skin is warm and dry. She is not diaphoretic.  Psychiatric: She has a normal mood and affect.      ASSESSMENT/ PLAN:  1. Edema: will being lasix 20 mg daily and will check bmp in 2 weeks will monitor her status  2. Neuropathic pain: will increase her elavil to 75 mg nightly and will monitor her status.   3. Insomnia: due to her complex medication regimen; will stop the remeron with the recommendation of nceps and will monitor her status.      Ok Edwards NP [facility]  Contact [phone] Monday through Friday 8am- 5pm  After hours call [phone]

## 2014-02-09 ENCOUNTER — Other Ambulatory Visit: Payer: Self-pay | Admitting: *Deleted

## 2014-02-09 DIAGNOSIS — M329 Systemic lupus erythematosus, unspecified: Secondary | ICD-10-CM | POA: Diagnosis not present

## 2014-02-09 MED ORDER — OXYCODONE HCL 15 MG PO TABS: ORAL_TABLET | ORAL | Status: DC

## 2014-02-09 NOTE — Telephone Encounter (Signed)
[facility] 

## 2014-02-10 ENCOUNTER — Encounter: Payer: Self-pay | Admitting: Internal Medicine

## 2014-02-10 ENCOUNTER — Other Ambulatory Visit: Payer: Self-pay | Admitting: Internal Medicine

## 2014-02-10 DIAGNOSIS — Z1231 Encounter for screening mammogram for malignant neoplasm of breast: Secondary | ICD-10-CM

## 2014-02-10 DIAGNOSIS — Z79899 Other long term (current) drug therapy: Secondary | ICD-10-CM | POA: Diagnosis not present

## 2014-02-12 ENCOUNTER — Other Ambulatory Visit: Payer: Self-pay | Admitting: *Deleted

## 2014-02-12 ENCOUNTER — Other Ambulatory Visit: Payer: Self-pay

## 2014-02-12 DIAGNOSIS — R300 Dysuria: Secondary | ICD-10-CM | POA: Diagnosis not present

## 2014-02-12 DIAGNOSIS — L0390 Cellulitis, unspecified: Secondary | ICD-10-CM | POA: Diagnosis not present

## 2014-02-12 DIAGNOSIS — I509 Heart failure, unspecified: Secondary | ICD-10-CM | POA: Diagnosis not present

## 2014-02-12 DIAGNOSIS — L0291 Cutaneous abscess, unspecified: Secondary | ICD-10-CM | POA: Diagnosis not present

## 2014-02-12 DIAGNOSIS — M7989 Other specified soft tissue disorders: Secondary | ICD-10-CM | POA: Diagnosis not present

## 2014-02-12 LAB — CBC WITH DIFFERENTIAL/PLATELET
BASOS PCT: 0.6 %
Basophil #: 0 10*3/uL (ref 0.0–0.1)
Eosinophil #: 1.7 10*3/uL — ABNORMAL HIGH (ref 0.0–0.7)
Eosinophil %: 23 %
HCT: 32.2 % — AB (ref 35.0–47.0)
HGB: 10.4 g/dL — AB (ref 12.0–16.0)
Lymphocyte #: 1.5 10*3/uL (ref 1.0–3.6)
Lymphocyte %: 19.9 %
MCH: 30.1 pg (ref 26.0–34.0)
MCHC: 32.4 g/dL (ref 32.0–36.0)
MCV: 93 fL (ref 80–100)
MONOS PCT: 8.2 %
Monocyte #: 0.6 x10 3/mm (ref 0.2–0.9)
Neutrophil #: 3.7 10*3/uL (ref 1.4–6.5)
Neutrophil %: 48.3 %
PLATELETS: 288 10*3/uL (ref 150–440)
RBC: 3.47 10*6/uL — ABNORMAL LOW (ref 3.80–5.20)
RDW: 16 % — ABNORMAL HIGH (ref 11.5–14.5)
WBC: 7.6 10*3/uL (ref 3.6–11.0)

## 2014-02-12 LAB — BASIC METABOLIC PANEL
BUN: 17 mg/dL (ref 4–21)
CREATININE: 0.7 mg/dL (ref 0.5–1.1)
Glucose: 242 mg/dL
POTASSIUM: 4.1 mmol/L (ref 3.4–5.3)
SODIUM: 137 mmol/L (ref 137–147)

## 2014-02-12 LAB — CBC AND DIFFERENTIAL
HCT: 32 % — AB (ref 36–46)
Hemoglobin: 10.4 g/dL — AB (ref 12.0–16.0)
Platelets: 288 10*3/uL (ref 150–399)
WBC: 7.6 10^3/mL

## 2014-02-12 MED ORDER — OXYCODONE HCL 15 MG PO TABS: ORAL_TABLET | ORAL | Status: DC

## 2014-02-12 NOTE — Telephone Encounter (Signed)
[facility] 

## 2014-02-13 ENCOUNTER — Non-Acute Institutional Stay (SKILLED_NURSING_FACILITY): Payer: Medicare Other | Admitting: Adult Health

## 2014-02-13 DIAGNOSIS — K5909 Other constipation: Secondary | ICD-10-CM | POA: Diagnosis not present

## 2014-02-13 LAB — BASIC METABOLIC PANEL
Anion Gap: 7 (ref 7–16)
BUN: 17 mg/dL (ref 7–18)
CO2: 28 mmol/L (ref 21–32)
Calcium, Total: 8.3 mg/dL — ABNORMAL LOW (ref 8.5–10.1)
Chloride: 102 mmol/L (ref 98–107)
Creatinine: 0.72 mg/dL (ref 0.60–1.30)
EGFR (African American): >60
EGFR (Non-African Amer.): >60
Glucose: 242 mg/dL — ABNORMAL HIGH (ref 65–99)
Osmolality: 283 (ref 275–301)
Potassium: 4.1 mmol/L (ref 3.5–5.1)
SODIUM: 137 mmol/L (ref 136–145)

## 2014-02-14 ENCOUNTER — Emergency Department (HOSPITAL_COMMUNITY): Payer: Medicare Other

## 2014-02-14 ENCOUNTER — Emergency Department (HOSPITAL_COMMUNITY)
Admission: EM | Admit: 2014-02-14 | Discharge: 2014-02-14 | Disposition: A | Discharge status: Home / Self Care | Payer: Medicare Other | Attending: Emergency Medicine | Admitting: Emergency Medicine

## 2014-02-14 ENCOUNTER — Encounter (HOSPITAL_COMMUNITY): Payer: Self-pay | Admitting: Emergency Medicine

## 2014-02-14 DIAGNOSIS — M25559 Pain in unspecified hip: Secondary | ICD-10-CM | POA: Insufficient documentation

## 2014-02-14 DIAGNOSIS — F3289 Other specified depressive episodes: Secondary | ICD-10-CM | POA: Diagnosis not present

## 2014-02-14 DIAGNOSIS — J449 Chronic obstructive pulmonary disease, unspecified: Secondary | ICD-10-CM | POA: Insufficient documentation

## 2014-02-14 DIAGNOSIS — S79919A Unspecified injury of unspecified hip, initial encounter: Secondary | ICD-10-CM | POA: Diagnosis not present

## 2014-02-14 DIAGNOSIS — I2510 Atherosclerotic heart disease of native coronary artery without angina pectoris: Secondary | ICD-10-CM | POA: Diagnosis not present

## 2014-02-14 DIAGNOSIS — W19XXXA Unspecified fall, initial encounter: Secondary | ICD-10-CM

## 2014-02-14 DIAGNOSIS — Z794 Long term (current) use of insulin: Secondary | ICD-10-CM | POA: Diagnosis not present

## 2014-02-14 DIAGNOSIS — I10 Essential (primary) hypertension: Secondary | ICD-10-CM | POA: Insufficient documentation

## 2014-02-14 DIAGNOSIS — D649 Anemia, unspecified: Secondary | ICD-10-CM | POA: Diagnosis not present

## 2014-02-14 DIAGNOSIS — G8929 Other chronic pain: Secondary | ICD-10-CM | POA: Insufficient documentation

## 2014-02-14 DIAGNOSIS — I69959 Hemiplegia and hemiparesis following unspecified cerebrovascular disease affecting unspecified side: Secondary | ICD-10-CM | POA: Insufficient documentation

## 2014-02-14 DIAGNOSIS — E785 Hyperlipidemia, unspecified: Secondary | ICD-10-CM | POA: Diagnosis not present

## 2014-02-14 DIAGNOSIS — F329 Major depressive disorder, single episode, unspecified: Secondary | ICD-10-CM | POA: Diagnosis not present

## 2014-02-14 DIAGNOSIS — Z888 Allergy status to other drugs, medicaments and biological substances status: Secondary | ICD-10-CM | POA: Insufficient documentation

## 2014-02-14 DIAGNOSIS — L0291 Cutaneous abscess, unspecified: Secondary | ICD-10-CM | POA: Diagnosis not present

## 2014-02-14 DIAGNOSIS — M79609 Pain in unspecified limb: Secondary | ICD-10-CM

## 2014-02-14 DIAGNOSIS — R609 Edema, unspecified: Secondary | ICD-10-CM

## 2014-02-14 DIAGNOSIS — J4489 Other specified chronic obstructive pulmonary disease: Secondary | ICD-10-CM | POA: Insufficient documentation

## 2014-02-14 DIAGNOSIS — R601 Generalized edema: Secondary | ICD-10-CM

## 2014-02-14 DIAGNOSIS — L0390 Cellulitis, unspecified: Secondary | ICD-10-CM | POA: Diagnosis not present

## 2014-02-14 DIAGNOSIS — Z79899 Other long term (current) drug therapy: Secondary | ICD-10-CM | POA: Insufficient documentation

## 2014-02-14 DIAGNOSIS — L02219 Cutaneous abscess of trunk, unspecified: Secondary | ICD-10-CM | POA: Diagnosis not present

## 2014-02-14 DIAGNOSIS — R339 Retention of urine, unspecified: Secondary | ICD-10-CM | POA: Diagnosis not present

## 2014-02-14 DIAGNOSIS — Z87891 Personal history of nicotine dependence: Secondary | ICD-10-CM | POA: Insufficient documentation

## 2014-02-14 DIAGNOSIS — E079 Disorder of thyroid, unspecified: Secondary | ICD-10-CM | POA: Diagnosis not present

## 2014-02-14 DIAGNOSIS — Z8673 Personal history of transient ischemic attack (TIA), and cerebral infarction without residual deficits: Secondary | ICD-10-CM | POA: Diagnosis not present

## 2014-02-14 DIAGNOSIS — R296 Repeated falls: Secondary | ICD-10-CM | POA: Insufficient documentation

## 2014-02-14 DIAGNOSIS — E119 Type 2 diabetes mellitus without complications: Secondary | ICD-10-CM | POA: Insufficient documentation

## 2014-02-14 DIAGNOSIS — Y939 Activity, unspecified: Secondary | ICD-10-CM | POA: Insufficient documentation

## 2014-02-14 DIAGNOSIS — S72009A Fracture of unspecified part of neck of unspecified femur, initial encounter for closed fracture: Secondary | ICD-10-CM | POA: Diagnosis not present

## 2014-02-14 DIAGNOSIS — Y9210 Unspecified residential institution as the place of occurrence of the external cause: Secondary | ICD-10-CM | POA: Insufficient documentation

## 2014-02-14 DIAGNOSIS — M25552 Pain in left hip: Secondary | ICD-10-CM

## 2014-02-14 DIAGNOSIS — M7989 Other specified soft tissue disorders: Secondary | ICD-10-CM | POA: Diagnosis not present

## 2014-02-14 DIAGNOSIS — L03319 Cellulitis of trunk, unspecified: Secondary | ICD-10-CM | POA: Diagnosis not present

## 2014-02-14 LAB — COMPREHENSIVE METABOLIC PANEL
ALK PHOS: 102 U/L (ref 39–117)
ALT: 10 U/L (ref 0–35)
AST: 18 U/L (ref 0–37)
Albumin: 2.7 g/dL — ABNORMAL LOW (ref 3.5–5.2)
BILIRUBIN TOTAL: 0.3 mg/dL (ref 0.3–1.2)
BUN: 13 mg/dL (ref 6–23)
CHLORIDE: 95 meq/L — AB (ref 96–112)
CO2: 31 mEq/L (ref 19–32)
Calcium: 9.1 mg/dL (ref 8.4–10.5)
Creatinine, Ser: 0.47 mg/dL — ABNORMAL LOW (ref 0.50–1.10)
GFR calc non Af Amer: >90 mL/min (ref >90)
GLUCOSE: 144 mg/dL — AB (ref 70–99)
Potassium: 3.9 mEq/L (ref 3.7–5.3)
Sodium: 136 mEq/L — ABNORMAL LOW (ref 137–147)
Total Protein: 7.3 g/dL (ref 6.0–8.3)

## 2014-02-14 LAB — CBC WITH DIFFERENTIAL/PLATELET
BASOS PCT: 0 % (ref 0–1)
Basophils Absolute: 0 10*3/uL (ref 0.0–0.1)
EOS ABS: 2.1 10*3/uL — AB (ref 0.0–0.7)
Eosinophils Relative: 19 % — ABNORMAL HIGH (ref 0–5)
HCT: 24.4 % — ABNORMAL LOW (ref 36.0–46.0)
Hemoglobin: 7.7 g/dL — ABNORMAL LOW (ref 12.0–15.0)
Lymphocytes Relative: 19 % (ref 12–46)
Lymphs Abs: 2.1 10*3/uL (ref 0.7–4.0)
MCH: 29.7 pg (ref 26.0–34.0)
MCHC: 31.6 g/dL (ref 30.0–36.0)
MCV: 94.2 fL (ref 78.0–100.0)
MONOS PCT: 7 % (ref 3–12)
Monocytes Absolute: 0.8 10*3/uL (ref 0.1–1.0)
NEUTROS PCT: 55 % (ref 43–77)
Neutro Abs: 6 10*3/uL (ref 1.7–7.7)
PLATELETS: 347 10*3/uL (ref 150–400)
RBC: 2.59 MIL/uL — ABNORMAL LOW (ref 3.87–5.11)
RDW: 15.1 % (ref 11.5–15.5)
WBC: 10.9 10*3/uL — ABNORMAL HIGH (ref 4.0–10.5)

## 2014-02-14 LAB — PROTIME-INR
INR: 1.01 (ref 0.00–1.49)
PROTHROMBIN TIME: 13.1 s (ref 11.6–15.2)

## 2014-02-14 LAB — URINE CULTURE

## 2014-02-14 LAB — POC OCCULT BLOOD, ED: FECAL OCCULT BLD: NEGATIVE

## 2014-02-14 LAB — PRO B NATRIURETIC PEPTIDE: Pro B Natriuretic peptide (BNP): 1090 pg/mL — ABNORMAL HIGH (ref 0–125)

## 2014-02-14 MED ORDER — CEPHALEXIN 500 MG PO CAPS: 500.0000 mg | ORAL_CAPSULE | Freq: Four times a day (QID) | ORAL | Status: DC

## 2014-02-14 MED ORDER — HYDROMORPHONE HCL PF 1 MG/ML IJ SOLN
0.5000 mg | INTRAMUSCULAR | Status: AC | PRN
  Administered 2014-02-14 (×2): 0.5 mg via INTRAVENOUS
  Filled 2014-02-14: qty 1

## 2014-02-14 NOTE — Discharge Instructions (Signed)
We saw you in the ER after you had a fall. No fractures seen with our Xrays. We had Radiologist and Orthopedist take a look at the Susan [name]. Orthopedist recommends physical therapy and follow up with the doctors who did the hip replacement.  Your hemoglobin was 7.9. We dont know what your normal value is - there is no blood in the stools. Please have someone check your hemoglobin as needed.  We are not sure what is causing your swelling. We recommend increasing lasix if appropriate, and getting heart echocardiogram or other studies to ensure the swelling is kept in check. There is some pain and redness - so we will put you on antibiotics for the possible skin infection.   Anemia, Nonspecific Anemia is a condition in which the concentration of red blood cells or hemoglobin in the blood is below normal. Hemoglobin is a substance in red blood cells that carries oxygen to the tissues of the body. Anemia results in not enough oxygen reaching these tissues.  CAUSES  Common causes of anemia include:   Excessive bleeding. Bleeding may be internal or external. This includes excessive bleeding from periods (in women) or from the intestine.   Poor nutrition.   Chronic kidney, thyroid, and liver disease.  Bone marrow disorders that decrease red blood cell production.  Cancer and treatments for cancer.  HIV, AIDS, and their treatments.  Spleen problems that increase red blood cell destruction.  Blood disorders.  Excess destruction of red blood cells due to infection, medicines, and autoimmune disorders. SIGNS AND SYMPTOMS   Minor weakness.   Dizziness.   Headache.  Palpitations.   Shortness of breath, especially with exercise.   Paleness.  Cold sensitivity.  Indigestion.  Nausea.  Difficulty sleeping.  Difficulty concentrating. Symptoms may occur suddenly or they may develop slowly.  DIAGNOSIS  Additional blood tests are often needed. These help your health care  provider determine the best treatment. Your health care provider will check your stool for blood and look for other causes of blood loss.  TREATMENT  Treatment varies depending on the cause of the anemia. Treatment can include:   Supplements of iron, vitamin C78, or folic acid.   Hormone medicines.   A blood transfusion. This may be needed if blood loss is severe.   Hospitalization. This may be needed if there is significant continual blood loss.   Dietary changes.  Spleen removal. HOME CARE INSTRUCTIONS Keep all follow-up appointments. It often takes many weeks to correct anemia, and having your health care provider check on your condition and your response to treatment is very important. SEEK IMMEDIATE MEDICAL CARE IF:   You develop extreme weakness, shortness of breath, or chest pain.   You become dizzy or have trouble concentrating.  You develop heavy vaginal bleeding.   You develop a rash.   You have bloody or black, tarry stools.   You faint.   You vomit up blood.   You vomit repeatedly.   You have abdominal pain.  You have a fever or persistent symptoms for more than 2 3 days.   You have a fever and your symptoms suddenly get worse.   You are dehydrated.  MAKE SURE YOU:  Understand these instructions.  Will watch your condition.  Will get help right away if you are not doing well or get worse. Document Released: 10/05/2004 Document Revised: 04/30/2013 Document Reviewed: 02/21/2013 [facility] Patient Information 2014 [place].  Edema Edema is an abnormal build-up of fluids in tissues. Because this  is partly dependent on gravity (water flows to the lowest place), it is more common in the legs and thighs (lower extremities). It is also common in the looser tissues, like around the eyes. Painless swelling of the feet and ankles is common and increases as a person ages. It may affect both legs and may include the calves or even thighs. When  squeezed, the fluid may move out of the affected area and may leave a dent for a few moments. CAUSES   Prolonged standing or sitting in one place for extended periods of time. Movement helps pump tissue fluid into the veins, and absence of movement prevents this, resulting in edema.  Varicose veins. The valves in the veins do not work as well as they should. This causes fluid to leak into the tissues.  Fluid and salt overload.  Injury, burn, or surgery to the leg, ankle, or foot, may damage veins and allow fluid to leak out.  Sunburn damages vessels. Leaky vessels allow fluid to go out into the sunburned tissues.  Allergies (from insect bites or stings, medications or chemicals) cause swelling by allowing vessels to become leaky.  Protein in the blood helps keep fluid in your vessels. Low protein, as in malnutrition, allows fluid to leak out.  Hormonal changes, including pregnancy and menstruation, cause fluid retention. This fluid may leak out of vessels and cause edema.  Medications that cause fluid retention. Examples are sex hormones, blood pressure medications, steroid treatment, or anti-depressants.  Some illnesses cause edema, especially heart failure, kidney disease, or liver disease.  Surgery that cuts veins or lymph nodes, such as surgery done for the heart or for breast cancer, may result in edema. DIAGNOSIS  Your caregiver is usually easily able to determine what is causing your swelling (edema) by simply asking what is wrong (getting a history) and examining you (doing a physical). Sometimes x-rays, EKG (electrocardiogram or heart tracing), and blood work may be done to evaluate for underlying medical illness. TREATMENT  General treatment includes:  Leg elevation (or elevation of the affected body part).  Restriction of fluid intake.  Prevention of fluid overload.  Compression of the affected body part. Compression with elastic bandages or support stockings squeezes  the tissues, preventing fluid from entering and forcing it back into the blood vessels.  Diuretics (also called water pills or fluid pills) pull fluid out of your body in the form of increased urination. These are effective in reducing the swelling, but can have side effects and must be used only under your caregiver's supervision. Diuretics are appropriate only for some types of edema. The specific treatment can be directed at any underlying causes discovered. Heart, liver, or kidney disease should be treated appropriately. HOME CARE INSTRUCTIONS   Elevate the legs (or affected body part) above the level of the heart, while lying down.  Avoid sitting or standing still for prolonged periods of time.  Avoid putting anything directly under the knees when lying down, and do not wear constricting clothing or garters on the upper legs.  Exercising the legs causes the fluid to work back into the veins and lymphatic channels. This may help the swelling go down.  The pressure applied by elastic bandages or support stockings can help reduce ankle swelling.  A low-salt diet may help reduce fluid retention and decrease the ankle swelling.  Take any medications exactly as prescribed. SEEK MEDICAL CARE IF:  Your edema is not responding to recommended treatments. SEEK IMMEDIATE MEDICAL CARE IF:  You develop shortness of breath or chest pain.  You cannot breathe when you lay down; or if, while lying down, you have to get up and go to the window to get your breath.  You are having increasing swelling without relief from treatment.  You develop a fever over 102 F (38.9 C).  You develop pain or redness in the areas that are swollen.  Tell your caregiver right away if you have gained 03 lb/1.4 kg in 1 day or 05 lb/2.3 kg in a week. MAKE SURE YOU:   Understand these instructions.  Will watch your condition.  Will get help right away if you are not doing well or get worse. Document Released:  08/28/2005 Document Revised: 02/27/2012 Document Reviewed: 04/15/2008 [facility] Patient Information 2014 [place]. Cellulitis Cellulitis is an infection of the skin and the tissue beneath it. The infected area is usually red and tender. Cellulitis occurs most often in the arms and lower legs.  CAUSES  Cellulitis is caused by bacteria that enter the skin through cracks or cuts in the skin. The most common types of bacteria that cause cellulitis are Staphylococcus and Streptococcus. SYMPTOMS   Redness and warmth.  Swelling.  Tenderness or pain.  Fever. DIAGNOSIS  Your caregiver can usually determine what is wrong based on a physical exam. Blood tests may also be done. TREATMENT  Treatment usually involves taking an antibiotic medicine. HOME CARE INSTRUCTIONS   Take your antibiotics as directed. Finish them even if you start to feel better.  Keep the infected arm or leg elevated to reduce swelling.  Apply a warm cloth to the affected area up to 4 times per day to relieve pain.  Only take over-the-counter or prescription medicines for pain, discomfort, or fever as directed by your caregiver.  Keep all follow-up appointments as directed by your caregiver. SEEK MEDICAL CARE IF:   You notice red streaks coming from the infected area.  Your red area gets larger or turns dark in color.  Your bone or joint underneath the infected area becomes painful after the skin has healed.  Your infection returns in the same area or another area.  You notice a swollen bump in the infected area.  You develop new symptoms. SEEK IMMEDIATE MEDICAL CARE IF:   You have a fever.  You feel very sleepy.  You develop vomiting or diarrhea.  You have a general ill feeling (malaise) with muscle aches and pains. MAKE SURE YOU:   Understand these instructions.  Will watch your condition.  Will get help right away if you are not doing well or get worse. Document Released: 06/07/2005  Document Revised: 02/27/2012 Document Reviewed: 11/13/2011 [facility] Patient Information 2014 [place].

## 2014-02-14 NOTE — ED Provider Notes (Signed)
CSN: [id number]     Arrival date & time 02/14/14  1346 History   First MD Initiated Contact with Patient 02/14/14 1406     Chief Complaint  Patient presents with  . Fall     (Consider location/radiation/quality/duration/timing/severity/associated sxs/prior Treatment) HPI Comments: Pt comes to the ER with cc of fall. Pt had a mechanical fall. She has hx of bilateral hip replacement, and has hx of stroke, unable to ambulate. She had a mechanical fall this morning. She is having left sided hip pain, and Xrays showed possible fracture-  Thus she was sent to the ER. Pt also complains of left leg swelling for the past several days She has some pain in the leg. No hx of PE, DVT. The center is giving her more lasix. She has hx of CHF. No chest pain, dib.  The history is provided by the patient.    Past Medical History  Diagnosis Date  . Stroke   . Hyperlipidemia   . Diabetes mellitus without complication   . COPD (chronic obstructive pulmonary disease)   . Depression   . Hypertension   . Thyroid disease   . Anemia   . CAD (coronary artery disease)   . Chronic pain   . Hemiplegia affecting non-dominant side, post-stroke   . Fall   . Urine retention    Past Surgical History  Procedure Laterality Date  . Joint replacement      bil hip  . Abdominal hysterectomy    . Cholecystectomy    . Appendectomy    . Tonsillectomy     No family history on file. History  Substance Use Topics  . Smoking status: Former Smoker    Types: Cigarettes  . Smokeless tobacco: Not on file  . Alcohol Use: No   OB History   Grav Para Term Preterm Abortions TAB SAB Ect Mult Living                 Review of Systems  Constitutional: Negative for activity change.  Respiratory: Negative for shortness of breath.   Cardiovascular: Negative for chest pain.  Gastrointestinal: Negative for nausea, vomiting and abdominal pain.  Genitourinary: Negative for dysuria.  Musculoskeletal: Positive for arthralgias.  Negative for neck pain.  Skin: Negative for rash.  Neurological: Negative for headaches.      Allergies  Niaspan; Other; Valium; and Wellbutrin  Home Medications   Prior to Admission medications   Medication Sig Start Date End Date Taking? Authorizing Provider  Aclidinium Bromide (TUDORZA PRESSAIR IN) Inhale 2 puffs into the lungs daily.   Yes Historical Provider, MD  albuterol (PROVENTIL HFA;VENTOLIN HFA) 108 (90 BASE) MCG/ACT inhaler Inhale 1 puff into the lungs every 6 (six) hours as needed for wheezing or shortness of breath.   Yes Historical Provider, MD  amitriptyline (ELAVIL) 75 MG tablet Take 1 tablet (75 mg total) by mouth at bedtime. 02/06/14  Yes Gerlene Fee, NP  ARIPiprazole (ABILIFY) 2 MG tablet Take 2 mg by mouth daily.   Yes Historical Provider, MD  clonazePAM (KLONOPIN) 0.5 MG tablet Take 0.5 mg by mouth every 6 (six) hours as needed for anxiety.   Yes Historical Provider, MD  diclofenac sodium (VOLTAREN) 1 % GEL Apply 2 g topically at bedtime. To both feet   Yes Historical Provider, MD  diltiazem (CARDIZEM CD) 120 MG 24 hr capsule Take 120 mg by mouth daily.   Yes Historical Provider, MD  DULoxetine (CYMBALTA) 60 MG capsule Take 1 capsule (60 mg total)  by mouth daily. 01/24/14  Yes Gerlene Fee, NP  furosemide (LASIX) 20 MG tablet Take 1 tablet (20 mg total) by mouth daily. 02/06/14  Yes Gerlene Fee, NP  gabapentin (NEURONTIN) 600 MG tablet Take 600 mg by mouth 3 (three) times daily.   Yes Historical Provider, MD  insulin detemir (LEVEMIR) 100 UNIT/ML injection Inject 0.35 mLs (35 Units total) into the skin at bedtime. 01/24/14  Yes Gerlene Fee, NP  insulin lispro (HUMALOG) 100 UNIT/ML injection Inject 0.05 mLs (5 Units total) into the skin 3 (three) times daily before meals. 5 units prior to meals with an additional 5 units for cbg>=150 01/24/14  Yes Gerlene Fee, NP  isosorbide mononitrate (IMDUR) 30 MG 24 hr tablet Take 30 mg by mouth daily.   Yes Historical  Provider, MD  levothyroxine (SYNTHROID, LEVOTHROID) 25 MCG tablet Take 25 mcg by mouth daily before breakfast.   Yes Historical Provider, MD  Melatonin 3 MG TABS Take 6 mg by mouth at bedtime.   Yes Historical Provider, MD  nicotine (NICODERM CQ - DOSED IN MG/24 HOURS) 21 mg/24hr patch Place 21 mg onto the skin daily.   Yes Historical Provider, MD  omega-3 acid ethyl esters (LOVAZA) 1 G capsule Take 2 g by mouth daily.   Yes Historical Provider, MD  oxyCODONE (ROXICODONE) 15 MG immediate release tablet Take one tablet by mouth every six hours for pain 02/12/14  Yes Pricilla Larsson, NP  polyethylene glycol powder (GLYCOLAX/MIRALAX) powder Take 17 g by mouth daily. 12/30/13  Yes Gerlene Fee, NP  rosuvastatin (CRESTOR) 40 MG tablet Take 40 mg by mouth every evening.    Yes Historical Provider, MD   BP 113/46  Pulse 86  Temp(Src) 98.6 F (37 C) (Oral)  Resp 16  SpO2 98% Physical Exam  Nursing note and vitals reviewed. Constitutional: She is oriented to person, place, and time. She appears well-developed and well-nourished.  HENT:  Head: Normocephalic and atraumatic.  Eyes: EOM are normal. Pupils are equal, round, and reactive to light.  Neck: Neck supple.  Cardiovascular: Normal rate, regular rhythm, normal heart sounds and intact distal pulses.   No murmur heard. Pulmonary/Chest: Effort normal. No respiratory distress.  Abdominal: Soft. She exhibits no distension. There is no tenderness. There is no rebound and no guarding.  Musculoskeletal: She exhibits edema and tenderness.  2+ pitting edema in the LLE and also upper extremity. There is some erythema and callor on the left groin.  Neurological: She is alert and oriented to person, place, and time.  Skin: Skin is warm and dry.    ED Course  Procedures (including critical care time) Labs Review Labs Reviewed  CBC WITH DIFFERENTIAL - Abnormal; Notable for the following:    WBC 10.9 (*)    RBC 2.59 (*)    Hemoglobin 7.7 (*)     HCT 24.4 (*)    Eosinophils Relative 19 (*)    Eosinophils Absolute 2.1 (*)    All other components within normal limits  PRO B NATRIURETIC PEPTIDE - Abnormal; Notable for the following:    Pro B Natriuretic peptide (BNP) 1090.0 (*)    All other components within normal limits  COMPREHENSIVE METABOLIC PANEL - Abnormal; Notable for the following:    Sodium 136 (*)    Chloride 95 (*)    Glucose, Bld 144 (*)    Creatinine, Ser 0.47 (*)    Albumin 2.7 (*)    All other components within normal limits  PROTIME-INR  URINALYSIS, ROUTINE W REFLEX MICROSCOPIC  POC OCCULT BLOOD, ED  POC OCCULT BLOOD, ED    Imaging Review Dg Hip Complete Left  02/14/2014   CLINICAL DATA:  Fall, pain left hip  EXAM: LEFT HIP - COMPLETE 2+ VIEW  COMPARISON:  None.  FINDINGS: The patient is rotated toward the left. Surgical changes of prior bilateral total hip arthroplasties. Negative for acute fracture or malalignment. The femoral head prostheses remain located within the acetabular cups. Heterotopic ossification is noted on the left extending from the anterior inferior iliac spine toward the greater trochanter. Additionally, there is a cortical buttressing and faint periosteal reaction along the lateral aspect of the proximal left femur adjacent to the distal aspect of the femoral stem. L5-S1 posterior lumbar interbody fusion incompletely imaged. The bones are diffusely osteopenic. Unremarkable visualized bowel gas pattern.  IMPRESSION: 1. No acute fracture or malalignment. 2. Heterotopic ossification the extends from the left anterior inferior iliac spine toward the greater trochanter. 3. Cortical buttressing and mild periosteal reaction along the lateral aspect of the left femoral diaphysis adjacent to the distal tip of the femoral stem suggests stress reaction.   Electronically Signed   By: Jacqulynn Cadet M.D.   On: 02/14/2014 15:40     EKG Interpretation None      MDM   Final diagnoses:  Hip fracture, left   Anasarca  Cellulitis  Pt with fall. Xrays of the hip are neg. Dr. Doran Durand reviewed the xrays as well. There is no acute intervention necessary - he wants outpatient f/u and pain control. Pt advised on those recs.  [place] leg ordered - no DVT.  She has low albumin, and bnp is slightly elevated. Advised nursing home to continue assessing patient for the leg swelling and anasarca like features.  Pt might have a mild cellulitis - antibiotics ordered.  PT wants to go home, she has pain meds at place - no acute management needed, so we will d.c.  Varney Biles, MD 02/14/14 [phone]

## 2014-02-14 NOTE — ED Notes (Signed)
Pt brought from [facility] via [facility] for fall. Pt was attempting to get out of bed when she fell.  Portable X-ray showed non-displaced fracture base of lesser trochanter. Hx of L and R hip replacement.  L sided paralysis d/t previous CVA.  Denies being on anticoagulants.

## 2014-02-14 NOTE — ED Notes (Signed)
While at pts bedside providing pericare to insert foley catheter Dr. Kathrynn Humble came to pts bedside and stated to hold off on foley catheter while he consults with orthopedic specialist.

## 2014-02-14 NOTE — Progress Notes (Signed)
VASCULAR LAB PRELIMINARY  PRELIMINARY  PRELIMINARY  PRELIMINARY  Left lower extremity venous Doppler completed.    Preliminary report:  There is no obvious evidence of DVT noted in the visualized veins of the left lower extremity.    Iantha Fallen, RVT 02/14/2014, 4:26 PM

## 2014-02-14 NOTE — ED Notes (Signed)
Dr Hewitt at bedside.  

## 2014-02-14 NOTE — Consult Note (Signed)
Reason for Consult:  Left hip pain Referring Physician:  Dr. Thersa Salt is an 63 y.o. female.  HPI:  63 y/o female with PMH of CAD, diabetes and stroke fell this morning at her SNF.  She reports that she doesn't walk without a hemi walker and assistance but nonetheless tried to get up and get to the bathroom this morning.  She fell on her left side.  She denies any LOC.  She reports aching pain at the left hip that is worse with motion and better with rest.  She has a h/o left THA by Dr. Cecille Aver in [place].    Past Medical History  Diagnosis Date  . Stroke   . Hyperlipidemia   . Diabetes mellitus without complication   . COPD (chronic obstructive pulmonary disease)   . Depression   . Hypertension   . Thyroid disease   . Anemia   . CAD (coronary artery disease)   . Chronic pain   . Hemiplegia affecting non-dominant side, post-stroke   . Fall   . Urine retention     Past Surgical History  Procedure Laterality Date  . Joint replacement      bil hip  . Abdominal hysterectomy    . Cholecystectomy    . Appendectomy    . Tonsillectomy      No family history on file.  Social History:  reports that she has quit smoking. Her smoking use included Cigarettes. She smoked 0.00 packs per day. She does not have any smokeless tobacco history on file. She reports that she does not drink alcohol or use illicit drugs.  Allergies:  Allergies  Allergen Reactions  . Niaspan [Niacin Er]   . Other     onions  . Valium [Diazepam]   . Wellbutrin [Bupropion]     Medications: I have reviewed the patient's current medications.  Results for orders placed during the hospital encounter of 02/14/14 (from the past 48 hour(s))  CBC WITH DIFFERENTIAL     Status: Abnormal   Collection Time    02/14/14  2:45 PM      Result Value Ref Range   WBC 10.9 (*) 4.0 - 10.5 K/uL   RBC 2.59 (*) 3.87 - 5.11 MIL/uL   Hemoglobin 7.7 (*) 12.0 - 15.0 g/dL   HCT 24.4 (*) 36.0 - 46.0 %   MCV 94.2  78.0 - 100.0 fL   MCH 29.7  26.0 - 34.0 pg   MCHC 31.6  30.0 - 36.0 g/dL   RDW 15.1  11.5 - 15.5 %   Platelets 347  150 - 400 K/uL   Neutrophils Relative % 55  43 - 77 %   Neutro Abs 6.0  1.7 - 7.7 K/uL   Lymphocytes Relative 19  12 - 46 %   Lymphs Abs 2.1  0.7 - 4.0 K/uL   Monocytes Relative 7  3 - 12 %   Monocytes Absolute 0.8  0.1 - 1.0 K/uL   Eosinophils Relative 19 (*) 0 - 5 %   Eosinophils Absolute 2.1 (*) 0.0 - 0.7 K/uL   Basophils Relative 0  0 - 1 %   Basophils Absolute 0.0  0.0 - 0.1 K/uL  PROTIME-INR     Status: None   Collection Time    02/14/14  2:45 PM      Result Value Ref Range   Prothrombin Time 13.1  11.6 - 15.2 seconds   INR 1.01  0.00 - 1.49  PRO B NATRIURETIC PEPTIDE  Status: Abnormal   Collection Time    02/14/14  2:45 PM      Result Value Ref Range   Pro B Natriuretic peptide (BNP) 1090.0 (*) 0 - 125 pg/mL  COMPREHENSIVE METABOLIC PANEL     Status: Abnormal   Collection Time    02/14/14  2:45 PM      Result Value Ref Range   Sodium 136 (*) 137 - 147 mEq/L   Potassium 3.9  3.7 - 5.3 mEq/L   Chloride 95 (*) 96 - 112 mEq/L   CO2 31  19 - 32 mEq/L   Glucose, Bld 144 (*) 70 - 99 mg/dL   BUN 13  6 - 23 mg/dL   Creatinine, Ser 0.47 (*) 0.50 - 1.10 mg/dL   Calcium 9.1  8.4 - 10.5 mg/dL   Total Protein 7.3  6.0 - 8.3 g/dL   Albumin 2.7 (*) 3.5 - 5.2 g/dL   AST 18  0 - 37 U/L   ALT 10  0 - 35 U/L   Alkaline Phosphatase 102  39 - 117 U/L   Total Bilirubin 0.3  0.3 - 1.2 mg/dL   GFR calc non Af Amer >90  >90 mL/min   GFR calc Af Amer >90  >90 mL/min   Comment: (NOTE)     The eGFR has been calculated using the CKD EPI equation.     This calculation has not been validated in all clinical situations.     eGFR's persistently <90 mL/min signify possible Chronic Kidney     Disease.  POC OCCULT BLOOD, ED     Status: None   Collection Time    02/14/14  4:07 PM      Result Value Ref Range   Fecal Occult Bld NEGATIVE  NEGATIVE    Dg Hip Complete  Left  02/14/2014   CLINICAL DATA:  Fall, pain left hip  EXAM: LEFT HIP - COMPLETE 2+ VIEW  COMPARISON:  None.  FINDINGS: The patient is rotated toward the left. Surgical changes of prior bilateral total hip arthroplasties. Negative for acute fracture or malalignment. The femoral head prostheses remain located within the acetabular cups. Heterotopic ossification is noted on the left extending from the anterior inferior iliac spine toward the greater trochanter. Additionally, there is a cortical buttressing and faint periosteal reaction along the lateral aspect of the proximal left femur adjacent to the distal aspect of the femoral stem. L5-S1 posterior lumbar interbody fusion incompletely imaged. The bones are diffusely osteopenic. Unremarkable visualized bowel gas pattern.  IMPRESSION: 1. No acute fracture or malalignment. 2. Heterotopic ossification the extends from the left anterior inferior iliac spine toward the greater trochanter. 3. Cortical buttressing and mild periosteal reaction along the lateral aspect of the left femoral diaphysis adjacent to the distal tip of the femoral stem suggests stress reaction.   Electronically Signed   By: Jacqulynn Cadet M.D.   On: 02/14/2014 15:40    ROS:  No recent f/c/n/v/wt loss PE:  Blood pressure 116/38, pulse 91, temperature 98.6 F (37 C), temperature source Oral, resp. rate 16, SpO2 98.00%. wn wd woman appearing older than her stated age.  A and O x 4.  Mood and affect normal.  EOMI.  resp unlabored.  L LE without gross deformity aside from bilat LE swelling.  Skin healthy and intact.  Surgical incisions healed well.  5/5 strength in PF of the ankle.  1/5 in dorsiflexion.  PF contracture at the ankle.  Sens to LT intact at the  foot.  Brisk cap refill at the toes.  No lymphadenopathy.  Assessment/Plan: L hip pain - on my review of the xrays I don't see any acute injury.  I don't believe there is any indication for surgical treatment at this time.  I believe  she can be WBAT on the L LE with assistance and pain control.  She should f/u with Dr. Venetia Night in a few weeks if she still has pain.  She can be discharged to SNF today if her pain can be adequately controlled.  She understands the plan and agrees.  Wylene Simmer 02/14/2014, 5:00 PM

## 2014-02-16 ENCOUNTER — Ambulatory Visit: Payer: Self-pay | Admitting: Internal Medicine

## 2014-02-16 ENCOUNTER — Encounter: Payer: Self-pay | Admitting: Adult Health

## 2014-02-16 ENCOUNTER — Non-Acute Institutional Stay (SKILLED_NURSING_FACILITY): Payer: Medicare Other | Admitting: Adult Health

## 2014-02-16 DIAGNOSIS — R609 Edema, unspecified: Secondary | ICD-10-CM

## 2014-02-16 DIAGNOSIS — I639 Cerebral infarction, unspecified: Secondary | ICD-10-CM

## 2014-02-16 DIAGNOSIS — F419 Anxiety disorder, unspecified: Secondary | ICD-10-CM

## 2014-02-16 DIAGNOSIS — I6350 Cerebral infarction due to unspecified occlusion or stenosis of unspecified cerebral artery: Secondary | ICD-10-CM

## 2014-02-16 DIAGNOSIS — I2510 Atherosclerotic heart disease of native coronary artery without angina pectoris: Secondary | ICD-10-CM

## 2014-02-16 DIAGNOSIS — F411 Generalized anxiety disorder: Secondary | ICD-10-CM

## 2014-02-17 ENCOUNTER — Ambulatory Visit (HOSPITAL_COMMUNITY): Payer: Self-pay

## 2014-02-17 DIAGNOSIS — R0602 Shortness of breath: Secondary | ICD-10-CM | POA: Diagnosis not present

## 2014-02-17 DIAGNOSIS — F4489 Other dissociative and conversion disorders: Secondary | ICD-10-CM | POA: Diagnosis not present

## 2014-02-17 DIAGNOSIS — R4182 Altered mental status, unspecified: Secondary | ICD-10-CM | POA: Diagnosis not present

## 2014-02-19 DIAGNOSIS — F39 Unspecified mood [affective] disorder: Secondary | ICD-10-CM | POA: Diagnosis not present

## 2014-02-19 DIAGNOSIS — F3289 Other specified depressive episodes: Secondary | ICD-10-CM | POA: Diagnosis not present

## 2014-02-19 DIAGNOSIS — F329 Major depressive disorder, single episode, unspecified: Secondary | ICD-10-CM | POA: Diagnosis not present

## 2014-02-19 DIAGNOSIS — G4700 Insomnia, unspecified: Secondary | ICD-10-CM | POA: Diagnosis not present

## 2014-02-19 DIAGNOSIS — F411 Generalized anxiety disorder: Secondary | ICD-10-CM | POA: Diagnosis not present

## 2014-02-23 DIAGNOSIS — G9050 Complex regional pain syndrome I, unspecified: Secondary | ICD-10-CM | POA: Insufficient documentation

## 2014-02-24 ENCOUNTER — Encounter: Payer: Self-pay | Admitting: Adult Health

## 2014-02-24 ENCOUNTER — Non-Acute Institutional Stay (SKILLED_NURSING_FACILITY): Payer: Medicare Other | Admitting: Adult Health

## 2014-02-24 DIAGNOSIS — M792 Neuralgia and neuritis, unspecified: Secondary | ICD-10-CM

## 2014-02-24 DIAGNOSIS — E1149 Type 2 diabetes mellitus with other diabetic neurological complication: Secondary | ICD-10-CM | POA: Diagnosis not present

## 2014-02-24 DIAGNOSIS — K5900 Constipation, unspecified: Secondary | ICD-10-CM

## 2014-02-24 DIAGNOSIS — G9050 Complex regional pain syndrome I, unspecified: Secondary | ICD-10-CM

## 2014-02-24 DIAGNOSIS — IMO0002 Reserved for concepts with insufficient information to code with codable children: Secondary | ICD-10-CM

## 2014-02-24 DIAGNOSIS — D649 Anemia, unspecified: Secondary | ICD-10-CM | POA: Insufficient documentation

## 2014-02-24 DIAGNOSIS — R609 Edema, unspecified: Secondary | ICD-10-CM

## 2014-02-24 DIAGNOSIS — I10 Essential (primary) hypertension: Secondary | ICD-10-CM | POA: Diagnosis not present

## 2014-02-24 DIAGNOSIS — E1165 Type 2 diabetes mellitus with hyperglycemia: Secondary | ICD-10-CM

## 2014-02-24 NOTE — Assessment & Plan Note (Signed)
Stable on current meds, no change.

## 2014-02-24 NOTE — Assessment & Plan Note (Addendum)
Controlled with current meds. Will discontinue Cardizem secondary to increase edema. We will monitor her BP closely. We are increasing her lasix and this may help with BP control during this time

## 2014-02-24 NOTE — Assessment & Plan Note (Signed)
Her pain is not controlled by her accounts but we will not be able to increase her Oxycodone secondary to the sedating effect that it has on her. May consider a long acting agent.

## 2014-02-24 NOTE — Assessment & Plan Note (Addendum)
Uncontrolled. Her last A1C was 9.3%. Due again in July. Increase the Levemir to 45 units SQ  Nightly and increase Humalog to 10 SQ with meals.

## 2014-02-24 NOTE — Assessment & Plan Note (Addendum)
She continues to have edema despite the addition of Lasix 20mg . Her weight in April was 147 lbs and is now 187 lbs. She is most likely more sedentary and not compliant with her diet. However, she continues to exhibit signs of excess fluid. Her albumin in the hospital was 2.7 which likely contributes to the issue. Begin Prostat 30cc BID. Change Cardizem as it can contribute to peripheral edema.  Increase Lasix to 40mg  qd and re eval BMP in a week. She has a hx of CHF but her CXR last week showed not signs of cardiomegaly or fluid overload.

## 2014-02-24 NOTE — Assessment & Plan Note (Addendum)
Previously her Hgb ran around 10.4 on 02/12/14, however, in the ER on 02/14/14 it was 7.7. ER notes reflect that her stool was negative for blood. Previous review of [place] noted that she has been positive and a GI Consult with colonoscopy was requested on 02/02/14.  F/U CBC, Ferritin, Serum Fe, TIBC, ESR retic count ordered to eval the need for iron therapy. She is not on anticoagulation.

## 2014-02-24 NOTE — Progress Notes (Signed)
Patient ID: Susan [name], female   DOB: Jan 11, 1951, 63 y.o.   MRN: [id number]   [facility] (31)  Code Status:  DNR  Chief Complaint  Patient presents with  . Medical Management of Chronic Issues    HPI:  This is a 63 y.o. Female resident living at [facility] in skilled care. I am evaluating her chronic conditions which include: Chronic pain, HTN, DM2, constipation, anemia, and edema.  Since her last visit she was sent to the ER on 02/14/14 and diagnosed with cellulitis, although the location was not clear in the ER note or to the resident. She was place on Keflex for 7 days. She does have some longstanding edema with mild erythema to the left side of her body due to gravity, as she has a h/o CVA with left sided weakness. Her only complaint today is chronic pain that goes through her "whole body".  Her oxycodone was decreased on 02/12/14 to q6hr due to AMS and lethargy. She would like this increased back to q4h. However, upon entering the room she was asleep and it took considerable stimulation to awaken her for an interview. In addition, her blood sugars are not controlled. Range for morning, lunch, and supper over the last week respectively: 250-428, 313-393, 304-400.   Allergies  Allergen Reactions  . Niaspan [Niacin Er]   . Other     onions  . Valium [Diazepam]   . Wellbutrin [Bupropion]     MEDICATIONS - reviewed  DATA REVIEWED  Radiologic Exams 02/17/14: PCXR/KUB: No new acute cardiopulmonary abnormality. Improved but persistent colonic constipation or fecal stasis.  02/14/14: Left hip xray: Hairline break in the cortex of the lesser trochanter. Continued follow up recommended.  Laboratory Studies   LABS REVIEWED:   01-05-14: urine culture: no growth 01-06-14: wbc 9.4; hgb 10.8; hct 35.5; mcv 98.3 plt 319; glucose 313; bun 33; creat 0.5; k+4.2; na++137 liver normal albumin 3.0 chol 166; ldl 104; trig 156; tsh 4.429  hgb a1c 9.3      REVIEW OF SYSTEMS    DATA OBTAINED: from patient, nurse, medical record,  GENERAL: Feels well  No recent fever, fatigue, change in activity status, appetite  RESPIRATORY: No cough, wheezing, SOB CARDIAC: No chest pain, palpitations. No edema GI: No abdominal pain  No Nausea,vomiting,diarrhea or constipation  No heartburn or reflux  MUSCULOSKELETAL: Chronic pain through her whole body. Intermittent numbness to the left arm and leg NEUROLOGIC: No dizziness, fainting, headache, or change in MS PSYCHIATRIC: Intermittent anxiety  Sleeps well   No behavior issue  PHYSICAL EXAM Filed Vitals:   02/24/14 1127  BP: 119/61  Pulse: 81  Temp: 98.5 F (36.9 C)  Resp: 20  Weight: 187 lb (84.823 kg)  SpO2: 90%   Body mass index is 33.13 kg/(m^2). GENERAL APPEARANCE: No acute distress, appropriately groomed, normal body habitus. Sleepy, pleasant, conversant. HEAD: Normocephalic, atraumatic EYES: Conjunctiva/lids clear [place] RESPIRATORY: Breathing is even, unlabored  Lung sounds are clear and full  CARDIOVASCULAR: Heart RRR   No murmur or extra heart sounds. BPPP+2 ABD: BSx4 decreased, non-distended, no organomegaly, no tenderness noted.   EDEMA: Left arm and leg with 2+ edema with mild redness noted to the left arm MUSCULOSKELETAL.  Left sided hemiparesis. Foot drop on the left. Normal strength and tone on the right. PSYCHIATRIC: Mood and affect appropriate to situation   ASSESSMENT/PLAN  Anemia Previously her Hgb ran around 10.4 on 02/12/14, however, in the ER on 02/14/14 it was 7.7.  ER notes reflect that her stool was negative for blood. Previous review of [place] noted that she has been positive and a GI Consult with colonoscopy was requested on 02/02/14.  F/U CBC, Ferritin, Serum Fe, TIBC, ESR retic count ordered to eval the need for iron therapy. She is not on anticoagulation.  Type II diabetes mellitus with neurological manifestations, uncontrolled Uncontrolled. Her last A1C was 9.3%. Due again in July. Increase the  Levemir to 45 units SQ  Nightly and increase Humalog to 10 SQ with meals.   Essential hypertension, benign Controlled with current meds. Will discontinue Cardizem secondary to increase edema. We will monitor her BP closely. We are increasing her lasix and this may help with BP control during this time  Constipation Stable on current meds, no change.  Edema She continues to have edema despite the addition of Lasix 13m. Her weight in April was 147 lbs and is now 187 lbs. She is most likely more sedentary and not compliant with her diet. However, she continues to exhibit signs of excess fluid. Her albumin in the hospital was 2.7 which likely contributes to the issue. Begin Prostat 30cc BID. Change Cardizem as it can contribute to peripheral edema.  Increase Lasix to 436mqd and re eval BMP in a week. She has a hx of CHF but her CXR last week showed not signs of cardiomegaly or fluid overload.   Neuropathic pain syndrome (non-herpetic) Her pain is not controlled by her accounts but we will not be able to increase her Oxycodone secondary to the sedating effect that it has on her. May consider a long acting agent.   RSD (reflex sympathetic dystrophy) Lupus screen mostly negative, however she has a positive ANA. Continue current pain meds and monitor.   Labs/tests ordered: CBC, retic count, TIBC, ESR, Serum Fe, ferritin next draw, A1C in July   Debbie Romie Keeble, NP/Christina WeMelvyn NovasN, MSN PSCox [facility]06/16/2015

## 2014-02-24 NOTE — Assessment & Plan Note (Signed)
Lupus screen mostly negative, however she has a positive ANA. Continue current pain meds and monitor.

## 2014-02-25 DIAGNOSIS — D62 Acute posthemorrhagic anemia: Secondary | ICD-10-CM | POA: Diagnosis not present

## 2014-02-25 DIAGNOSIS — D638 Anemia in other chronic diseases classified elsewhere: Secondary | ICD-10-CM | POA: Diagnosis not present

## 2014-02-25 DIAGNOSIS — D6489 Other specified anemias: Secondary | ICD-10-CM | POA: Diagnosis not present

## 2014-02-25 DIAGNOSIS — D649 Anemia, unspecified: Secondary | ICD-10-CM | POA: Diagnosis not present

## 2014-03-03 DIAGNOSIS — I10 Essential (primary) hypertension: Secondary | ICD-10-CM | POA: Diagnosis not present

## 2014-03-03 LAB — BASIC METABOLIC PANEL
BUN: 22 mg/dL — AB (ref 4–21)
CREATININE: 0.8 mg/dL (ref 0.5–1.1)
GLUCOSE: 277 mg/dL
POTASSIUM: 4.2 mmol/L (ref 3.4–5.3)
SODIUM: 134 mmol/L — AB (ref 137–147)

## 2014-03-04 DIAGNOSIS — F3289 Other specified depressive episodes: Secondary | ICD-10-CM | POA: Diagnosis not present

## 2014-03-04 DIAGNOSIS — F411 Generalized anxiety disorder: Secondary | ICD-10-CM | POA: Diagnosis not present

## 2014-03-04 DIAGNOSIS — F39 Unspecified mood [affective] disorder: Secondary | ICD-10-CM | POA: Diagnosis not present

## 2014-03-04 DIAGNOSIS — G4700 Insomnia, unspecified: Secondary | ICD-10-CM | POA: Diagnosis not present

## 2014-03-04 DIAGNOSIS — F329 Major depressive disorder, single episode, unspecified: Secondary | ICD-10-CM | POA: Diagnosis not present

## 2014-03-04 MED ORDER — SENNOSIDES-DOCUSATE SODIUM 8.6-50 MG PO TABS: 2.0000 | ORAL_TABLET | Freq: Every day | ORAL | Status: DC

## 2014-03-04 NOTE — Progress Notes (Signed)
Patient ID: Susan [name], female   DOB: 04-Oct-1950, 63 y.o.   MRN: [id number]     [facility]  Allergies  Allergen Reactions  . Niaspan [Niacin Er]   . Other     onions  . Valium [Diazepam]   . Wellbutrin [Bupropion]      Chief Complaint  Patient presents with  . Acute Visit    follow up results     HPI:  She had complained of left arm edema and abdominal distention. Her doppler does not show dvt. She does not have any signs of chf present. She does have however; constipation which will need to be addressed.    Past Medical History  Diagnosis Date  . Stroke   . Hyperlipidemia   . Diabetes mellitus without complication   . COPD (chronic obstructive pulmonary disease)   . Depression   . Hypertension   . Thyroid disease   . Anemia   . CAD (coronary artery disease)   . Chronic pain   . Hemiplegia affecting non-dominant side, post-stroke   . Fall   . Urine retention     Past Surgical History  Procedure Laterality Date  . Joint replacement      bil hip  . Abdominal hysterectomy    . Cholecystectomy    . Appendectomy    . Tonsillectomy      VITAL SIGNS BP 132/70  Pulse 68  Ht 5\' 3"  (1.6 m)  Wt 182 lb (82.555 kg)  BMI 32.25 kg/m2   Patient's Medications  New Prescriptions   No medications on file  Previous Medications   ACLIDINIUM BROMIDE (TUDORZA PRESSAIR IN)    Inhale 2 puffs into the lungs daily.   ALBUTEROL (PROVENTIL HFA;VENTOLIN HFA) 108 (90 BASE) MCG/ACT INHALER    Inhale 1 puff into the lungs every 6 (six) hours as needed for wheezing or shortness of breath.   AMITRIPTYLINE (ELAVIL) 75 MG TABLET    Take 1 tablet (75 mg total) by mouth at bedtime.   ARIPIPRAZOLE (ABILIFY) 2 MG TABLET    Take 2 mg by mouth daily.   CEPHALEXIN (KEFLEX) 500 MG CAPSULE    Take 1 capsule (500 mg total) by mouth 4 (four) times daily.   CLONAZEPAM (KLONOPIN) 0.5 MG TABLET    Take 0.5 mg by mouth every 6 (six) hours as needed for anxiety.   DICLOFENAC SODIUM (VOLTAREN) 1 %  GEL    Apply 2 g topically at bedtime. To both feet   DILTIAZEM (CARDIZEM CD) 120 MG 24 HR CAPSULE    Take 120 mg by mouth daily.   DULOXETINE (CYMBALTA) 60 MG CAPSULE    Take 1 capsule (60 mg total) by mouth daily.   FUROSEMIDE (LASIX) 20 MG TABLET    Take 1 tablet (20 mg total) by mouth daily.   GABAPENTIN (NEURONTIN) 600 MG TABLET    Take 600 mg by mouth 3 (three) times daily.   INSULIN DETEMIR (LEVEMIR) 100 UNIT/ML INJECTION    Inject 0.35 mLs (35 Units total) into the skin at bedtime.   INSULIN LISPRO (HUMALOG) 100 UNIT/ML INJECTION    Inject 0.05 mLs (5 Units total) into the skin 3 (three) times daily before meals. 5 units prior to meals with an additional 5 units for cbg>=150   ISOSORBIDE MONONITRATE (IMDUR) 30 MG 24 HR TABLET    Take 30 mg by mouth daily.   LEVOTHYROXINE (SYNTHROID, LEVOTHROID) 25 MCG TABLET    Take 25 mcg by mouth daily before breakfast.  MELATONIN 3 MG TABS    Take 6 mg by mouth at bedtime.   NICOTINE (NICODERM CQ - DOSED IN MG/24 HOURS) 21 MG/24HR PATCH    Place 21 mg onto the skin daily.   OMEGA-3 ACID ETHYL ESTERS (LOVAZA) 1 G CAPSULE    Take 2 g by mouth daily.   OXYCODONE (ROXICODONE) 15 MG IMMEDIATE RELEASE TABLET    Take one tablet by mouth every six hours for pain   POLYETHYLENE GLYCOL POWDER (GLYCOLAX/MIRALAX) POWDER    Take 17 g by mouth daily.   ROSUVASTATIN (CRESTOR) 40 MG TABLET    Take 40 mg by mouth every evening.   Modified Medications   No medications on file  Discontinued Medications   No medications on file    SIGNIFICANT DIAGNOSTIC EXAMS  02-12-14: left upper extremity doppler: negative for dvt  02-12-14: chest x-ray: no acute disease; negative for chf  02-12-14: kub: 1. Colonic constipation or feces stasis 2. Nonspecific notable gaseous gastric distention chronicity underlying etiology known. 3. No other acute abnormality   LABS REVIEWED:   01-05-14: urine culture: no growth 01-06-14: wbc 9.4; hgb 10.8; hct 35.5; mcv 98.3 plt 319; glucose 313;  bun 33; creat 0.5; k+4.2; na++137 liver normal albumin 3.0 chol 166; ldl 104; trig 156; tsh 4.429  hgb a1c 9.3 02-12-14: wbc 7.6; hgb 10.4; hct 32.2; mcv 93; plt 288; glucose 242; bun 17; creat 0.72; k+4.1; na++137       Review of Systems  Constitutional: Negative for malaise/fatigue.  Respiratory: Negative for cough and shortness of breath.   Cardiovascular: Negative for chest pain, palpitations  Has edema   Gastrointestinal:. Negative for heartburn and abdominal pain. has constipation Musculoskeletal: has generalized pain in joints and muscles   Skin: Negative.   Psychiatric/Behavioral: Negative for depression. The patient is not nervous/anxious.      Physical Exam  Constitutional: She appears well-developed and well-nourished. No distress.  Neck: Neck supple. No JVD present. No thyromegaly present.  Cardiovascular: Normal rate, regular rhythm and intact distal pulses.   Respiratory: Effort normal and breath sounds normal. No respiratory distress. She has no wheezes.  GI: Soft. Bowel sounds are normal. She exhibits no distension. There is no tenderness.  Musculoskeletal: has trace ankle edema   Has left side hemiparesis present   Neurological: She is alert.  Skin: Skin is warm and dry. She is not diaphoretic.  Psychiatric: She has a normal mood and affect.     ASSESSMENT/ PLAN:  1. Constipation: will begin senna s 2 tabs twice daily and will monitor

## 2014-03-05 MED ORDER — FUROSEMIDE 20 MG PO TABS: 40.0000 mg | ORAL_TABLET | Freq: Every day | ORAL | Status: DC

## 2014-03-05 NOTE — Progress Notes (Signed)
This encounter was created in error - please disregard.

## 2014-03-05 NOTE — Progress Notes (Signed)
Patient ID: Susan [name], female   DOB: Feb 18, 1951, 63 y.o.   MRN: [id number]     [facility]  Allergies  Allergen Reactions  . Niaspan [Niacin Er]   . Other     onions  . Valium [Diazepam]   . Wellbutrin [Bupropion]      Chief Complaint  Patient presents with  . Acute Visit    family concerns    HPI:  Over the past weekend she went to the er after fall demonstrated a possible hip fracture. The repeated x-rays at the hospital did not show any fracture. Her family is concerned about her confusion. Her BNP is elevated; she has gained weight; her abdomen is more distended; she is presently taking lasix 20 mg daily.    Past Medical History  Diagnosis Date  . Stroke   . Hyperlipidemia   . Diabetes mellitus without complication   . COPD (chronic obstructive pulmonary disease)   . Depression   . Hypertension   . Thyroid disease   . Anemia   . CAD (coronary artery disease)   . Chronic pain   . Hemiplegia affecting non-dominant side, post-stroke   . Fall   . Urine retention     Past Surgical History  Procedure Laterality Date  . Joint replacement      bil hip  . Abdominal hysterectomy    . Cholecystectomy    . Appendectomy    . Tonsillectomy      VITAL SIGNS BP 129/80  Pulse 76  Ht 5\' 3"  (1.6 m)  Wt 182 lb (82.555 kg)  BMI 32.25 kg/m2   Patient's Medications  New Prescriptions   No medications on file  Previous Medications   ACLIDINIUM BROMIDE (TUDORZA PRESSAIR IN)    Inhale 2 puffs into the lungs daily.   ALBUTEROL (PROVENTIL HFA;VENTOLIN HFA) 108 (90 BASE) MCG/ACT INHALER    Inhale 1 puff into the lungs every 6 (six) hours as needed for wheezing or shortness of breath.   AMITRIPTYLINE (ELAVIL) 75 MG TABLET    Take 1 tablet (75 mg total) by mouth at bedtime.   ARIPIPRAZOLE (ABILIFY) 2 MG TABLET    Take 2 mg by mouth daily.   CEPHALEXIN (KEFLEX) 500 MG CAPSULE    Take 1 capsule (500 mg total) by mouth 4 (four) times daily.   CLONAZEPAM (KLONOPIN) 0.5 MG  TABLET    Take 0.5 mg by mouth every 6 (six) hours as needed for anxiety.   DICLOFENAC SODIUM (VOLTAREN) 1 % GEL    Apply 2 g topically at bedtime. To both feet   DILTIAZEM (CARDIZEM CD) 120 MG 24 HR CAPSULE    Take 120 mg by mouth daily.   DULOXETINE (CYMBALTA) 60 MG CAPSULE    Take 1 capsule (60 mg total) by mouth daily.   FUROSEMIDE (LASIX) 20 MG TABLET    Take 1 tablet (20 mg total) by mouth daily.   GABAPENTIN (NEURONTIN) 600 MG TABLET    Take 600 mg by mouth 3 (three) times daily.   INSULIN DETEMIR (LEVEMIR) 100 UNIT/ML INJECTION    Inject 0.35 mLs (35 Units total) into the skin at bedtime.   INSULIN LISPRO (HUMALOG) 100 UNIT/ML INJECTION    Inject 0.05 mLs (5 Units total) into the skin 3 (three) times daily before meals. 5 units prior to meals with an additional 5 units for cbg>=150   ISOSORBIDE MONONITRATE (IMDUR) 30 MG 24 HR TABLET    Take 30 mg by mouth daily.   LEVOTHYROXINE (  SYNTHROID, LEVOTHROID) 25 MCG TABLET    Take 25 mcg by mouth daily before breakfast.   MELATONIN 3 MG TABS    Take 6 mg by mouth at bedtime.   NICOTINE (NICODERM CQ - DOSED IN MG/24 HOURS) 21 MG/24HR PATCH    Place 21 mg onto the skin daily.   OMEGA-3 ACID ETHYL ESTERS (LOVAZA) 1 G CAPSULE    Take 2 g by mouth daily.   OXYCODONE (ROXICODONE) 15 MG IMMEDIATE RELEASE TABLET    Take one tablet by mouth every six hours for pain   POLYETHYLENE GLYCOL POWDER (GLYCOLAX/MIRALAX) POWDER    Take 17 g by mouth daily.   ROSUVASTATIN (CRESTOR) 40 MG TABLET    Take 40 mg by mouth every evening.    SENNA-DOCUSATE (SENOKOT-S) 8.6-50 MG PER TABLET    Take 2 tablets by mouth daily.  Modified Medications   No medications on file  Discontinued Medications   No medications on file    SIGNIFICANT DIAGNOSTIC EXAMS   02-12-14: left upper extremity doppler: negative for dvt  02-12-14: chest x-ray: no acute disease; negative for chf  02-12-14: kub: 1. Colonic constipation or feces stasis 2. Nonspecific notable gaseous gastric  distention chronicity underlying etiology known. 3. No other acute abnormality  02-14-14: left hip x-ray: hairline break in the cortex at the base of the lesser trochanter   02-14-14: left hip x-ray at ED: 1. No acute fracture or malalignment. 2. Heterotopic ossification the extends from the left anterior inferior iliac spine toward the greater trochanter. 3. Cortical buttressing and mild periosteal reaction along the lateral aspect of the left femoral diaphysis adjacent to the distal tip of the femoral stem suggests stress reaction.      LABS REVIEWED:   01-05-14: urine culture: no growth 01-06-14: wbc 9.4; hgb 10.8; hct 35.5; mcv 98.3 plt 319; glucose 313; bun 33; creat 0.5; k+4.2; na++137 liver normal albumin 3.0 chol 166; ldl 104; trig 156; tsh 4.429  hgb a1c 9.3 02-12-14: wbc 7.6; hgb 10.4; hct 32.2; mcv 93; plt 288; glucose 242; bun 17; creat 0.72; k+4.1; na++137 Urine culture: no growth 02-14-14: wbc 10.9; hgb 7.7; hct 24.4; mcv 94.2; plt 347; glucose 144; bun 13; creat 0.47; k+3.9; na++136; liver normal albumin 2.7; BNP 1090       Review of Systems  Constitutional: Negative for malaise/fatigue.  Respiratory: Negative for cough and shortness of breath.   Cardiovascular: Negative for chest pain, palpitations  Has edema   Gastrointestinal:. Negative for heartburn and abdominal pain. has constipation Musculoskeletal: has generalized pain in joints and muscles   Skin: Negative.   Psychiatric/Behavioral: Negative for depression. The patient is not nervous/anxious.      Physical Exam  Constitutional: She appears well-developed and well-nourished. No distress.  Neck: Neck supple. No JVD present. No thyromegaly present.  Cardiovascular: Normal rate, regular rhythm and intact distal pulses.   Respiratory: Effort normal and breath sounds normal. No respiratory distress. She has no wheezes.  GI: Soft. Bowel sounds are normal. She exhibits no distension. There is no tenderness.    Musculoskeletal: has trace ankle edema   Has left side hemiparesis present   Neurological: She is alert.  Skin: Skin is warm and dry. She is not diaphoretic.  Psychiatric: She has a normal mood and affect.       ASSESSMENT/ PLAN:  1. Anxiety 2. CAD 3. Edema Will stop her abilify due to her increased confusion. Will increase her lasix to 40 mg daily; will check cbc and  bnp in the am; in the am will check chest x-ray and kub. Will weigh her on m-w-f and will monitor her status  Time spent with patient 45 minutes.       Ok Edwards NP [facility]  Contact [phone] Monday through Friday 8am- 5pm  After hours call [phone]

## 2014-03-06 DIAGNOSIS — N390 Urinary tract infection, site not specified: Secondary | ICD-10-CM | POA: Diagnosis not present

## 2014-03-09 ENCOUNTER — Non-Acute Institutional Stay (SKILLED_NURSING_FACILITY): Payer: Medicare Other | Admitting: Adult Health

## 2014-03-09 DIAGNOSIS — IMO0002 Reserved for concepts with insufficient information to code with codable children: Secondary | ICD-10-CM | POA: Diagnosis not present

## 2014-03-09 DIAGNOSIS — M792 Neuralgia and neuritis, unspecified: Secondary | ICD-10-CM

## 2014-03-10 DIAGNOSIS — I69998 Other sequelae following unspecified cerebrovascular disease: Secondary | ICD-10-CM | POA: Diagnosis not present

## 2014-03-10 LAB — BASIC METABOLIC PANEL
BUN: 27 mg/dL — AB (ref 4–21)
CREATININE: 0.6 mg/dL (ref 0.5–1.1)
GLUCOSE: 308 mg/dL
Potassium: 4.1 mmol/L (ref 3.4–5.3)
Sodium: 132 mmol/L — AB (ref 137–147)

## 2014-03-10 LAB — HEPATIC FUNCTION PANEL
ALT: 8 U/L (ref 7–35)
AST: 12 U/L — AB (ref 13–35)
Alkaline Phosphatase: 92 U/L (ref 25–125)
BILIRUBIN, TOTAL: 0.4 mg/dL

## 2014-03-10 LAB — CBC AND DIFFERENTIAL
HCT: 35 % — AB (ref 36–46)
Hemoglobin: 10.5 g/dL — AB (ref 12.0–16.0)
Platelets: 308 10*3/uL (ref 150–399)
WBC: 7.8 10^3/mL

## 2014-03-12 ENCOUNTER — Encounter: Payer: Self-pay | Admitting: Adult Health

## 2014-03-12 ENCOUNTER — Non-Acute Institutional Stay (SKILLED_NURSING_FACILITY): Payer: Medicare Other | Admitting: Adult Health

## 2014-03-12 DIAGNOSIS — R609 Edema, unspecified: Secondary | ICD-10-CM | POA: Diagnosis not present

## 2014-03-12 DIAGNOSIS — D721 Eosinophilia, unspecified: Secondary | ICD-10-CM | POA: Insufficient documentation

## 2014-03-12 DIAGNOSIS — E1149 Type 2 diabetes mellitus with other diabetic neurological complication: Secondary | ICD-10-CM

## 2014-03-12 DIAGNOSIS — R109 Unspecified abdominal pain: Secondary | ICD-10-CM | POA: Diagnosis not present

## 2014-03-12 DIAGNOSIS — R143 Flatulence: Secondary | ICD-10-CM

## 2014-03-12 DIAGNOSIS — M25559 Pain in unspecified hip: Secondary | ICD-10-CM | POA: Diagnosis not present

## 2014-03-12 DIAGNOSIS — R140 Abdominal distension (gaseous): Secondary | ICD-10-CM

## 2014-03-12 DIAGNOSIS — IMO0002 Reserved for concepts with insufficient information to code with codable children: Secondary | ICD-10-CM

## 2014-03-12 DIAGNOSIS — R142 Eructation: Secondary | ICD-10-CM | POA: Diagnosis not present

## 2014-03-12 DIAGNOSIS — R141 Gas pain: Secondary | ICD-10-CM

## 2014-03-12 DIAGNOSIS — E1165 Type 2 diabetes mellitus with hyperglycemia: Secondary | ICD-10-CM

## 2014-03-12 NOTE — Progress Notes (Signed)
Patient ID: Susan [name], female   DOB: 08/26/1951, 63 y.o.   MRN: [id number]     [facility]  Allergies  Allergen Reactions  . Niaspan [Niacin Er]   . Other     onions  . Valium [Diazepam]   . Wellbutrin [Bupropion]      Chief Complaint  Patient presents with  . elevated cbgs    HPI:  This is a 63 y.o. Female with a h/o CVA with left sided hemiparesis. She has uncontrolled DM Type 2. Her blood sugars have been running over 400 on a regular basis. She reports having increased urination. She also reports left sided abd pain radiating through to her back. For the past few days, not associated with meals. She reports that she has had this pain before when she had pancreatitis. She has significant edema and a very tight abd. We increased her Lasix to 40mg  on June 16th and it appears that the edema has worsened. She does not have CP or SOB. She has been refusing weights. She has a low albumin and is on Prostat but has a good appetite. She also has increased rigidity to her body and was placed on Baclofen on 03/09/14. She reports that this has not helped.   Past Medical History  Diagnosis Date  . Stroke   . Hyperlipidemia   . Diabetes mellitus without complication   . COPD (chronic obstructive pulmonary disease)   . Depression   . Hypertension   . Thyroid disease   . Anemia   . CAD (coronary artery disease)   . Chronic pain   . Hemiplegia affecting non-dominant side, post-stroke   . Fall   . Urine retention     Past Surgical History  Procedure Laterality Date  . Joint replacement      bil hip  . Abdominal hysterectomy    . Cholecystectomy    . Appendectomy    . Tonsillectomy      VITAL SIGNS BP 110/60  Pulse 81  Temp(Src) 97.4 F (36.3 C)  Resp 22   Patient's Medications  New Prescriptions   No medications on file  Previous Medications   ACLIDINIUM BROMIDE (TUDORZA PRESSAIR IN)    Inhale 2 puffs into the lungs daily.   ALBUTEROL (PROVENTIL HFA;VENTOLIN HFA)  108 (90 BASE) MCG/ACT INHALER    Inhale 1 puff into the lungs every 6 (six) hours as needed for wheezing or shortness of breath.   AMITRIPTYLINE (ELAVIL) 75 MG TABLET    Take 1 tablet (75 mg total) by mouth at bedtime.   CEPHALEXIN (KEFLEX) 500 MG CAPSULE    Take 1 capsule (500 mg total) by mouth 4 (four) times daily.   CLONAZEPAM (KLONOPIN) 0.5 MG TABLET    Take 0.5 mg by mouth every 6 (six) hours as needed for anxiety.   DICLOFENAC SODIUM (VOLTAREN) 1 % GEL    Apply 2 g topically at bedtime. To both feet   DILTIAZEM (CARDIZEM CD) 120 MG 24 HR CAPSULE    Take 120 mg by mouth daily.   DULOXETINE (CYMBALTA) 60 MG CAPSULE    Take 1 capsule (60 mg total) by mouth daily.   FUROSEMIDE (LASIX) 20 MG TABLET    Take 2 tablets (40 mg total) by mouth daily.   GABAPENTIN (NEURONTIN) 600 MG TABLET    Take 600 mg by mouth 3 (three) times daily.   INSULIN DETEMIR (LEVEMIR) 100 UNIT/ML INJECTION    Inject 0.35 mLs (35 Units total) into the skin at  bedtime.   INSULIN LISPRO (HUMALOG) 100 UNIT/ML INJECTION    Inject 0.05 mLs (5 Units total) into the skin 3 (three) times daily before meals. 5 units prior to meals with an additional 5 units for cbg>=150   ISOSORBIDE MONONITRATE (IMDUR) 30 MG 24 HR TABLET    Take 30 mg by mouth daily.   LEVOTHYROXINE (SYNTHROID, LEVOTHROID) 25 MCG TABLET    Take 25 mcg by mouth daily before breakfast.   MELATONIN 3 MG TABS    Take 6 mg by mouth at bedtime.   NICOTINE (NICODERM CQ - DOSED IN MG/24 HOURS) 21 MG/24HR PATCH    Place 21 mg onto the skin daily.   OMEGA-3 ACID ETHYL ESTERS (LOVAZA) 1 G CAPSULE    Take 2 g by mouth daily.   OXYCODONE (ROXICODONE) 15 MG IMMEDIATE RELEASE TABLET    Take one tablet by mouth every six hours for pain   POLYETHYLENE GLYCOL POWDER (GLYCOLAX/MIRALAX) POWDER    Take 17 g by mouth daily.   ROSUVASTATIN (CRESTOR) 40 MG TABLET    Take 40 mg by mouth every evening.    SENNA-DOCUSATE (SENOKOT-S) 8.6-50 MG PER TABLET    Take 2 tablets by mouth daily.    Modified Medications   No medications on file  Discontinued Medications   No medications on file    SIGNIFICANT DIAGNOSTIC EXAMS   02-12-14: left upper extremity doppler: negative for dvt  02-12-14: chest x-ray: no acute disease; negative for chf  02-12-14: kub: 1. Colonic constipation or feces stasis 2. Nonspecific notable gaseous gastric distention chronicity underlying etiology known. 3. No other acute abnormality  02-14-14: left hip x-ray: hairline break in the cortex at the base of the lesser trochanter   02-14-14: left hip x-ray at ED: 1. No acute fracture or malalignment. 2. Heterotopic ossification the extends from the left anterior inferior iliac spine toward the greater trochanter. 3. Cortical buttressing and mild periosteal reaction along the lateral aspect of the left femoral diaphysis adjacent to the distal tip of the femoral stem suggests stress reaction.      LABS REVIEWED:   01-05-14: urine culture: no growth 01-06-14: wbc 9.4; hgb 10.8; hct 35.5; mcv 98.3 plt 319; glucose 313; bun 33; creat 0.5; k+4.2; na++137 liver normal albumin 3.0 chol 166; ldl 104; trig 156; tsh 4.429  hgb a1c 9.3 02-12-14: wbc 7.6; hgb 10.4; hct 32.2; mcv 93; plt 288; glucose 242; bun 17; creat 0.72; k+4.1; na++137 Urine culture: no growth, lupus panel mostly negative except ANA pos 02-14-14: wbc 10.9; hgb 7.7; hct 24.4; mcv 94.2; plt 347; glucose 144; bun 13; creat 0.47; k+3.9; na++136; liver normal albumin 2.7; BNP 1090 03/03/14: NA-134, K-4.2, CL-98, CO2-32, Glucose-277, BUN-22, Cr-0.8, Ca-8.9 02/17/14-BNP-24  03/06/14: UA: 50,000 of Proteus Mirabilis 03/10/14: WBC 7.8, Hgb 10.5, MCV-95.1, Plt-308, Eos-19.3%, Neut:48.9, Na-132, Cl-95, Co2-37, Glucose-308, BUN-27, Cr-0.6, Ca-8.6, Alb-3.1, AST-12, ALT-8, amylase-12, lipase-7  ROS  DATA OBTAINED: from patient, nurse, medical record,  GENERAL: Appears well but reports pain with each visit. No change in appetite. Increased weight RESPIRATORY: No cough,  wheezing, SOB CARDIAC: No chest pain, palpitations. Significant edema to abd, arms, legs GI: Left sided abd pain radiating to back, not associated with meals.  No nausea,vomiting,diarrhea or constipation  No heartburn or reflux  MUSCULOSKELETAL:generalized pain. In the bed most of the time. Increased rigidity to arms and legs. NEUROLOGIC: No dizziness, fainting, headache, numbness No change in mental status   PHYSICAL ASSESSMENT  GENERAL APPEARANCE: No acute distress, appropriately groomed, large  body habitus. Alert, pleasant, conversant. SKIN: No diaphoresis, rash, unusual lesions, wounds HEAD: Normocephalic, atraumatic EYES: Conjunctiva/lids clear. Pupils round, reactive. EOMs intact.  NECK: Supple, full ROM. No thyroid tenderness, enlargement or nodule LYMPHATICS: No head, neck or supraclavicular adenopathy RESPIRATORY: Breathing is even, unlabored. Lung sounds are clear and full.  CARDIOVASCULAR: Heart RRR. No murmur or extra heart sounds  EDEMA: edema to abd, legs and arms +3 GASTROINTESTINAL: Abdomen is firm, non-tender, slightly distended with normal bsx4. No hepatic or splenic enlargement. Dull to percussion MUSCULOSKELETAL: Increased rigidity to the left arm and leg. Foot drop NEUROLOGIC: Oriented to time, place, person. Left sided hemiparesis PSYCHIATRIC: Mood and affect appropriate to situation  ASSESSMENT/ PLAN:  1) DM2: Increase Levemir to 55 units daily, increase Humalog to 12 units AC meals. Add Tradgenta 5mg  p.o. Qd.  Encourage diabetic diet.  2) Edema: Add Aldactone 25mg  qd. Check BMP in 1 week. We discussed that she should allow the staff to weigh her so that we can accurately assess her fluid status.  3) Abd distention: Check KUB. She had a B.M, today and denies constipation but may need an enema depending on the results.  Previous workup for pancreatitis or decreased liver fxn have been unrevealing.  4)  Left hip pain: last work up for this problem was negative but  will repeat xray   5) Eosinophilia: ?etiology, monitor for now  6) Sleep apnea: She has not been wearing her CPAP machine. We discussed the importance of compliance regarding this issue    Ok Edwards NP/Christina Wert RN/MSN [facility]  Contact [phone] Monday through Friday 8am- 5pm  After hours call [phone]

## 2014-03-15 NOTE — Progress Notes (Signed)
Patient ID: Susan [name], female   DOB: [birth year], 63 y.o.   MRN: [id number]    [facility]  Allergies  Allergen Reactions  . Niaspan [Niacin Er]   . Other     onions  . Valium [Diazepam]   . Wellbutrin [Bupropion]      Chief Complaint  Patient presents with  . Acute Visit    patient concerns     HPI:  She is having increased spasticity in her lower extremities with pain present. She is wanting an increase in her narcotics which I think is not appropriate at this time. She is complaining of abdominal pain; and she is concerned about pancreatis as she has had this in the past.    Past Medical History  Diagnosis Date  . Stroke   . Hyperlipidemia   . Diabetes mellitus without complication   . COPD (chronic obstructive pulmonary disease)   . Depression   . Hypertension   . Thyroid disease   . Anemia   . CAD (coronary artery disease)   . Chronic pain   . Hemiplegia affecting non-dominant side, post-stroke   . Fall   . Urine retention     Past Surgical History  Procedure Laterality Date  . Joint replacement      bil hip  . Abdominal hysterectomy    . Cholecystectomy    . Appendectomy    . Tonsillectomy      VITAL SIGNS BP 129/87  Pulse 70  Ht 5\' 3"  (1.6 m)  Wt 187 lb (84.823 kg)  BMI 33.13 kg/m2   Patient's Medications  New Prescriptions   No medications on file  Previous Medications   ACLIDINIUM BROMIDE (TUDORZA PRESSAIR IN)    Inhale 2 puffs into the lungs daily.   ALBUTEROL (PROVENTIL HFA;VENTOLIN HFA) 108 (90 BASE) MCG/ACT INHALER    Inhale 1 puff into the lungs every 6 (six) hours as needed for wheezing or shortness of breath.   AMITRIPTYLINE (ELAVIL) 75 MG TABLET    Take 1 tablet (75 mg total) by mouth at bedtime.   ARIPIPRAZOLE (ABILIFY) 2 MG TABLET    Take 2 mg by mouth daily.   CEPHALEXIN (KEFLEX) 500 MG CAPSULE    Take 1 capsule (500 mg total) by mouth 4 (four) times daily.   CLONAZEPAM (KLONOPIN) 0.5 MG TABLET    Take 0.5 mg by mouth every 6  (six) hours as needed for anxiety.   DICLOFENAC SODIUM (VOLTAREN) 1 % GEL    Apply 2 g topically at bedtime. To both feet   DILTIAZEM (CARDIZEM CD) 120 MG 24 HR CAPSULE    Take 120 mg by mouth daily.   DULOXETINE (CYMBALTA) 60 MG CAPSULE    Take 1 capsule (60 mg total) by mouth daily.   FUROSEMIDE (LASIX) 20 MG TABLET    Take 1 tablet (20 mg total) by mouth daily.   GABAPENTIN (NEURONTIN) 600 MG TABLET    Take 600 mg by mouth 3 (three) times daily.   INSULIN DETEMIR (LEVEMIR) 100 UNIT/ML INJECTION    Inject 0.35 mLs (35 Units total) into the skin at bedtime.   INSULIN LISPRO (HUMALOG) 100 UNIT/ML INJECTION    Inject 0.05 mLs (5 Units total) into the skin 3 (three) times daily before meals. 5 units prior to meals with an additional 5 units for cbg>=150   ISOSORBIDE MONONITRATE (IMDUR) 30 MG 24 HR TABLET    Take 30 mg by mouth daily.   LEVOTHYROXINE (SYNTHROID, LEVOTHROID) 25 MCG TABLET  Take 25 mcg by mouth daily before breakfast.   MELATONIN 3 MG TABS    Take 6 mg by mouth at bedtime.   NICOTINE (NICODERM CQ - DOSED IN MG/24 HOURS) 21 MG/24HR PATCH    Place 21 mg onto the skin daily.   OMEGA-3 ACID ETHYL ESTERS (LOVAZA) 1 G CAPSULE    Take 2 g by mouth daily.   OXYCODONE (ROXICODONE) 15 MG IMMEDIATE RELEASE TABLET    Take one tablet by mouth every six hours for pain   POLYETHYLENE GLYCOL POWDER (GLYCOLAX/MIRALAX) POWDER    Take 17 g by mouth daily.   ROSUVASTATIN (CRESTOR) 40 MG TABLET    Take 40 mg by mouth every evening.    SENNA-DOCUSATE (SENOKOT-S) 8.6-50 MG PER TABLET    Take 2 tablets by mouth daily.  Modified Medications   No medications on file  Discontinued Medications   No medications on file    SIGNIFICANT DIAGNOSTIC EXAMS   02-12-14: left upper extremity doppler: negative for dvt  02-12-14: chest x-ray: no acute disease; negative for chf  02-12-14: kub: 1. Colonic constipation or feces stasis 2. Nonspecific notable gaseous gastric distention chronicity underlying etiology known.  3. No other acute abnormality  02-14-14: left hip x-ray: hairline break in the cortex at the base of the lesser trochanter   02-14-14: left hip x-ray at ED: 1. No acute fracture or malalignment. 2. Heterotopic ossification the extends from the left anterior inferior iliac spine toward the greater trochanter. 3. Cortical buttressing and mild periosteal reaction along the lateral aspect of the left femoral diaphysis adjacent to the distal tip of the femoral stem suggests stress reaction.      LABS REVIEWED:   01-05-14: urine culture: no growth 01-06-14: wbc 9.4; hgb 10.8; hct 35.5; mcv 98.3 plt 319; glucose 313; bun 33; creat 0.5; k+4.2; na++137 liver normal albumin 3.0 chol 166; ldl 104; trig 156; tsh 4.429  hgb a1c 9.3 02-12-14: wbc 7.6; hgb 10.4; hct 32.2; mcv 93; plt 288; glucose 242; bun 17; creat 0.72; k+4.1; na++137 Urine culture: no growth 02-14-14: wbc 10.9; hgb 7.7; hct 24.4; mcv 94.2; plt 347; glucose 144; bun 13; creat 0.47; k+3.9; na++136; liver normal albumin 2.7; BNP 1090       Review of Systems  Constitutional: Negative for malaise/fatigue.  Respiratory: Negative for cough and shortness of breath.   Cardiovascular: Negative for chest pain, palpitations  Has edema   Gastrointestinal:. Negative for heartburn has abdominal pain Musculoskeletal: has generalized pain in joints and muscles   Skin: Negative.   Psychiatric/Behavioral: Negative for depression. The patient is not nervous/anxious.      Physical Exam  Constitutional: She appears well-developed and well-nourished. No distress.  Neck: Neck supple. No JVD present. No thyromegaly present.  Cardiovascular: Normal rate, regular rhythm and intact distal pulses.   Respiratory: Effort normal and breath sounds normal. No respiratory distress. She has no wheezes.  GI: Soft. Bowel sounds are normal. She exhibits no distension. There is no tenderness.  Musculoskeletal: has  edema   Has left side hemiparesis present   Neurological:  She is alert.  Skin: Skin is warm and dry. She is not diaphoretic.  Psychiatric: She has a normal mood and affect.       ASSESSMENT/ PLAN:  1. Neuropathic pain syndrome: she has increased spasticity present in her lower extremities. Will being baclofen 5 mg three times daily as she has failed robaxin and flexeril in the past.   Will get a cbc and cmp  with amylase and lipase in the am

## 2014-03-16 ENCOUNTER — Encounter: Payer: Self-pay | Admitting: Adult Health

## 2014-03-16 ENCOUNTER — Non-Acute Institutional Stay (SKILLED_NURSING_FACILITY): Payer: Medicare Other | Admitting: Adult Health

## 2014-03-16 DIAGNOSIS — R269 Unspecified abnormalities of gait and mobility: Secondary | ICD-10-CM | POA: Diagnosis not present

## 2014-03-16 DIAGNOSIS — E1165 Type 2 diabetes mellitus with hyperglycemia: Principal | ICD-10-CM

## 2014-03-16 DIAGNOSIS — K5909 Other constipation: Secondary | ICD-10-CM

## 2014-03-16 DIAGNOSIS — R279 Unspecified lack of coordination: Secondary | ICD-10-CM | POA: Diagnosis not present

## 2014-03-16 DIAGNOSIS — E1149 Type 2 diabetes mellitus with other diabetic neurological complication: Secondary | ICD-10-CM | POA: Diagnosis not present

## 2014-03-16 DIAGNOSIS — IMO0002 Reserved for concepts with insufficient information to code with codable children: Secondary | ICD-10-CM

## 2014-03-16 DIAGNOSIS — M6281 Muscle weakness (generalized): Secondary | ICD-10-CM | POA: Diagnosis not present

## 2014-03-16 DIAGNOSIS — I69959 Hemiplegia and hemiparesis following unspecified cerebrovascular disease affecting unspecified side: Secondary | ICD-10-CM | POA: Diagnosis not present

## 2014-03-16 NOTE — Progress Notes (Signed)
Patient ID: Susan [name], female   DOB: 29-Mar-1951, 63 y.o.   MRN: [id number]     [facility]  Allergies  Allergen Reactions  . Niaspan [Niacin Er]   . Other     onions  . Valium [Diazepam]   . Wellbutrin [Bupropion]      Chief Complaint  Patient presents with  . f/u distended abd and elevated CBGs    HPI:  This is a 63 y.o. Female with a h/o CVA with left sided hemiparesis. She has uncontrolled DM Type 2. Her blood sugars have been running over 400 on a regular basis and on 03/12/14 she was placed on Tradgenta. Her Levemir was increased to 55 units daily and her Humalog coverage was increased to 12 units with meals. Over the weekend her CBGs decreased slightly, ranging 247-415. She has had significant abd distention and pain for the past few days. Her liver enzymes, lipase, WBCs, and amylase have been normal and she has been afebrile. A KUB done on 03/12/14 showed notable gaseous distention and fecal stasis. She reports having BMs regularly on senna-s and miralax.   Past Medical History  Diagnosis Date  . Stroke   . Hyperlipidemia   . Diabetes mellitus without complication   . COPD (chronic obstructive pulmonary disease)   . Depression   . Hypertension   . Thyroid disease   . Anemia   . CAD (coronary artery disease)   . Chronic pain   . Hemiplegia affecting non-dominant side, post-stroke   . Fall   . Urine retention     Past Surgical History  Procedure Laterality Date  . Joint replacement      bil hip  . Abdominal hysterectomy    . Cholecystectomy    . Appendectomy    . Tonsillectomy      VITAL SIGNS BP 124/78  Pulse 89  Temp(Src) 97.7 F (36.5 C)  Resp 20  Wt 173 lb 8 oz (78.699 kg)  SpO2 95%   Patient's Medications  New Prescriptions   No medications on file  Previous Medications   ACLIDINIUM BROMIDE (TUDORZA PRESSAIR IN)    Inhale 2 puffs into the lungs daily.   ALBUTEROL (PROVENTIL HFA;VENTOLIN HFA) 108 (90 BASE) MCG/ACT INHALER    Inhale 1 puff into  the lungs every 6 (six) hours as needed for wheezing or shortness of breath.   AMITRIPTYLINE (ELAVIL) 75 MG TABLET    Take 1 tablet (75 mg total) by mouth at bedtime.   CEPHALEXIN (KEFLEX) 500 MG CAPSULE    Take 1 capsule (500 mg total) by mouth 4 (four) times daily.   CLONAZEPAM (KLONOPIN) 0.5 MG TABLET    Take 0.5 mg by mouth every 6 (six) hours as needed for anxiety.   DICLOFENAC SODIUM (VOLTAREN) 1 % GEL    Apply 2 g topically at bedtime. To both feet   DILTIAZEM (CARDIZEM CD) 120 MG 24 HR CAPSULE    Take 120 mg by mouth daily.   DULOXETINE (CYMBALTA) 60 MG CAPSULE    Take 1 capsule (60 mg total) by mouth daily.   FUROSEMIDE (LASIX) 20 MG TABLET    Take 2 tablets (40 mg total) by mouth daily.   GABAPENTIN (NEURONTIN) 600 MG TABLET    Take 600 mg by mouth 3 (three) times daily.   INSULIN DETEMIR (LEVEMIR) 100 UNIT/ML INJECTION    Inject 0.35 mLs (35 Units total) into the skin at bedtime.   INSULIN LISPRO (HUMALOG) 100 UNIT/ML INJECTION    Inject  0.05 mLs (5 Units total) into the skin 3 (three) times daily before meals. 5 units prior to meals with an additional 5 units for cbg>=150   ISOSORBIDE MONONITRATE (IMDUR) 30 MG 24 HR TABLET    Take 30 mg by mouth daily.   LEVOTHYROXINE (SYNTHROID, LEVOTHROID) 25 MCG TABLET    Take 25 mcg by mouth daily before breakfast.   MELATONIN 3 MG TABS    Take 6 mg by mouth at bedtime.   NICOTINE (NICODERM CQ - DOSED IN MG/24 HOURS) 21 MG/24HR PATCH    Place 21 mg onto the skin daily.   OMEGA-3 ACID ETHYL ESTERS (LOVAZA) 1 G CAPSULE    Take 2 g by mouth daily.   OXYCODONE (ROXICODONE) 15 MG IMMEDIATE RELEASE TABLET    Take one tablet by mouth every six hours for pain   POLYETHYLENE GLYCOL POWDER (GLYCOLAX/MIRALAX) POWDER    Take 17 g by mouth daily.   ROSUVASTATIN (CRESTOR) 40 MG TABLET    Take 40 mg by mouth every evening.    SENNA-DOCUSATE (SENOKOT-S) 8.6-50 MG PER TABLET    Take 2 tablets by mouth daily.   SPIRONOLACTONE (ALDACTONE) 25 MG TABLET    Take 25 mg  by mouth daily.  Modified Medications   No medications on file  Discontinued Medications   No medications on file    SIGNIFICANT DIAGNOSTIC EXAMS   02-12-14: left upper extremity doppler: negative for dvt  02-12-14: chest x-ray: no acute disease; negative for chf  02-12-14: kub: 1. Colonic constipation or feces stasis 2. Nonspecific notable gaseous gastric distention chronicity underlying etiology known. 3. No other acute abnormality  02-14-14: left hip x-ray: hairline break in the cortex at the base of the lesser trochanter   02-14-14: left hip x-ray at ED: 1. No acute fracture or malalignment. 2. Heterotopic ossification the extends from the left anterior inferior iliac spine toward the greater trochanter. 3. Cortical buttressing and mild periosteal reaction along the lateral aspect of the left femoral diaphysis adjacent to the distal tip of the femoral stem suggests stress reaction.    03-12-14: left hip xray: s/p left total hip arthroplasty, no acute osseous abnormality  03-12-14: abd xray: correlate clinically for significant colonic constipation or fecal stasis, no other acute abd abnormality evident  LABS REVIEWED:   01-05-14: urine culture: no growth 01-06-14: wbc 9.4; hgb 10.8; hct 35.5; mcv 98.3 plt 319; glucose 313; bun 33; creat 0.5; k+4.2; na++137 liver normal albumin 3.0 chol 166; ldl 104; trig 156; tsh 4.429  hgb a1c 9.3 02-12-14: wbc 7.6; hgb 10.4; hct 32.2; mcv 93; plt 288; glucose 242; bun 17; creat 0.72; k+4.1; na++137 Urine culture: no growth, lupus panel mostly negative except ANA pos 02-14-14: wbc 10.9; hgb 7.7; hct 24.4; mcv 94.2; plt 347; glucose 144; bun 13; creat 0.47; k+3.9; na++136; liver normal albumin 2.7; BNP 1090 03/03/14: NA-134, K-4.2, CL-98, CO2-32, Glucose-277, BUN-22, Cr-0.8, Ca-8.9 02/17/14-BNP-24  03/06/14: UA: 50,000 of Proteus Mirabilis 03/10/14: WBC 7.8, Hgb 10.5, MCV-95.1, Plt-308, Eos-19.3%, Neut:48.9, Na-132, Cl-95, Co2-37, Glucose-308, BUN-27, Cr-0.6, Ca-8.6,  Alb-3.1, AST-12, ALT-8, amylase-12, lipase-7    ROS  DATA OBTAINED: from patient, nurse, medical record,  GENERAL: Appears well but reports pain with each visit. No change in appetite. Increased weight RESPIRATORY: No cough, wheezing, SOB CARDIAC: No chest pain, palpitations. Significant edema to abd, arms, legs GI: Left sided abd pain radiating to back, not associated with meals.  No nausea,vomiting,diarrhea or constipation  No heartburn or reflux  MUSCULOSKELETAL:generalized pain. In the  bed most of the time. Increased rigidity to arms and legs. NEUROLOGIC: No dizziness, fainting, headache, numbness No change in mental status   PHYSICAL ASSESSMENT  GENERAL APPEARANCE: No acute distress, appropriately groomed, large body habitus. Alert, pleasant, conversant. SKIN: No diaphoresis, rash, unusual lesions, wounds HEAD: Normocephalic, atraumatic EYES: Conjunctiva/lids clear. Pupils round, reactive. EOMs intact.  NECK: Supple, full ROM. No thyroid tenderness, enlargement or nodule LYMPHATICS: No head, neck or supraclavicular adenopathy RESPIRATORY: Breathing is even, unlabored. Lung sounds are clear and full.  CARDIOVASCULAR: Heart RRR. No murmur or extra heart sounds  EDEMA: edema to abd, legs and arms +3 GASTROINTESTINAL: Abdomen is firm, non-tender, slightly distended with normal bsx4. No hepatic or splenic enlargement. Dull to percussion MUSCULOSKELETAL: Increased rigidity to the left arm and leg. Foot drop NEUROLOGIC: Oriented to time, place, person. Left sided hemiparesis PSYCHIATRIC: Mood and affect appropriate to situation  ASSESSMENT/ PLAN:  1) DM 2: minimal improvement with the addition of Tradgenta and increasing the Levemir. Continue to monitor and give the Tradgenta more time to work. Adjust Levemir to 60 units daily.  2) Constipation: Significant. Continue Senna-s BID and Miralax. Given Fleets enema today. If this continues to be a problem will consider Linzess.      Ok Edwards NP/Christina Wert RN/MSN [facility]  Contact [phone] Monday through Friday 8am- 5pm  After hours call [phone]

## 2014-03-17 DIAGNOSIS — R269 Unspecified abnormalities of gait and mobility: Secondary | ICD-10-CM | POA: Diagnosis not present

## 2014-03-17 DIAGNOSIS — R279 Unspecified lack of coordination: Secondary | ICD-10-CM | POA: Diagnosis not present

## 2014-03-17 DIAGNOSIS — M6281 Muscle weakness (generalized): Secondary | ICD-10-CM | POA: Diagnosis not present

## 2014-03-17 DIAGNOSIS — I69959 Hemiplegia and hemiparesis following unspecified cerebrovascular disease affecting unspecified side: Secondary | ICD-10-CM | POA: Diagnosis not present

## 2014-03-18 ENCOUNTER — Non-Acute Institutional Stay (SKILLED_NURSING_FACILITY): Payer: Medicare Other | Admitting: Adult Health

## 2014-03-18 DIAGNOSIS — R269 Unspecified abnormalities of gait and mobility: Secondary | ICD-10-CM | POA: Diagnosis not present

## 2014-03-18 DIAGNOSIS — R143 Flatulence: Secondary | ICD-10-CM

## 2014-03-18 DIAGNOSIS — R141 Gas pain: Secondary | ICD-10-CM | POA: Diagnosis not present

## 2014-03-18 DIAGNOSIS — I69959 Hemiplegia and hemiparesis following unspecified cerebrovascular disease affecting unspecified side: Secondary | ICD-10-CM | POA: Diagnosis not present

## 2014-03-18 DIAGNOSIS — R142 Eructation: Secondary | ICD-10-CM

## 2014-03-18 DIAGNOSIS — I6350 Cerebral infarction due to unspecified occlusion or stenosis of unspecified cerebral artery: Secondary | ICD-10-CM | POA: Diagnosis not present

## 2014-03-18 DIAGNOSIS — R140 Abdominal distension (gaseous): Secondary | ICD-10-CM

## 2014-03-18 DIAGNOSIS — I639 Cerebral infarction, unspecified: Secondary | ICD-10-CM

## 2014-03-18 DIAGNOSIS — M6281 Muscle weakness (generalized): Secondary | ICD-10-CM | POA: Diagnosis not present

## 2014-03-18 DIAGNOSIS — I69354 Hemiplegia and hemiparesis following cerebral infarction affecting left non-dominant side: Secondary | ICD-10-CM

## 2014-03-18 DIAGNOSIS — R279 Unspecified lack of coordination: Secondary | ICD-10-CM | POA: Diagnosis not present

## 2014-03-19 DIAGNOSIS — E875 Hyperkalemia: Secondary | ICD-10-CM | POA: Diagnosis not present

## 2014-03-19 DIAGNOSIS — R269 Unspecified abnormalities of gait and mobility: Secondary | ICD-10-CM | POA: Diagnosis not present

## 2014-03-19 DIAGNOSIS — I69959 Hemiplegia and hemiparesis following unspecified cerebrovascular disease affecting unspecified side: Secondary | ICD-10-CM | POA: Diagnosis not present

## 2014-03-19 DIAGNOSIS — M6281 Muscle weakness (generalized): Secondary | ICD-10-CM | POA: Diagnosis not present

## 2014-03-19 DIAGNOSIS — R279 Unspecified lack of coordination: Secondary | ICD-10-CM | POA: Diagnosis not present

## 2014-03-19 LAB — BASIC METABOLIC PANEL
BUN: 27 mg/dL — AB (ref 4–21)
Creatinine: 0.7 mg/dL (ref 0.5–1.1)
Glucose: 348 mg/dL
POTASSIUM: 4.4 mmol/L (ref 3.4–5.3)
Sodium: 133 mmol/L — AB (ref 137–147)

## 2014-03-20 DIAGNOSIS — M6281 Muscle weakness (generalized): Secondary | ICD-10-CM | POA: Diagnosis not present

## 2014-03-20 DIAGNOSIS — I69959 Hemiplegia and hemiparesis following unspecified cerebrovascular disease affecting unspecified side: Secondary | ICD-10-CM | POA: Diagnosis not present

## 2014-03-20 DIAGNOSIS — R269 Unspecified abnormalities of gait and mobility: Secondary | ICD-10-CM | POA: Diagnosis not present

## 2014-03-20 DIAGNOSIS — R279 Unspecified lack of coordination: Secondary | ICD-10-CM | POA: Diagnosis not present

## 2014-03-20 MED ORDER — INSULIN DETEMIR 100 UNIT/ML ~~LOC~~ SOLN
60.0000 [IU] | Freq: Every day | SUBCUTANEOUS | Status: DC
Start: 2014-03-20 — End: 2014-05-16

## 2014-03-23 DIAGNOSIS — I69959 Hemiplegia and hemiparesis following unspecified cerebrovascular disease affecting unspecified side: Secondary | ICD-10-CM | POA: Diagnosis not present

## 2014-03-23 DIAGNOSIS — R279 Unspecified lack of coordination: Secondary | ICD-10-CM | POA: Diagnosis not present

## 2014-03-23 DIAGNOSIS — R269 Unspecified abnormalities of gait and mobility: Secondary | ICD-10-CM | POA: Diagnosis not present

## 2014-03-23 DIAGNOSIS — M6281 Muscle weakness (generalized): Secondary | ICD-10-CM | POA: Diagnosis not present

## 2014-03-24 DIAGNOSIS — R279 Unspecified lack of coordination: Secondary | ICD-10-CM | POA: Diagnosis not present

## 2014-03-24 DIAGNOSIS — M6281 Muscle weakness (generalized): Secondary | ICD-10-CM | POA: Diagnosis not present

## 2014-03-24 DIAGNOSIS — R269 Unspecified abnormalities of gait and mobility: Secondary | ICD-10-CM | POA: Diagnosis not present

## 2014-03-24 DIAGNOSIS — I69959 Hemiplegia and hemiparesis following unspecified cerebrovascular disease affecting unspecified side: Secondary | ICD-10-CM | POA: Diagnosis not present

## 2014-03-25 DIAGNOSIS — M6281 Muscle weakness (generalized): Secondary | ICD-10-CM | POA: Diagnosis not present

## 2014-03-25 DIAGNOSIS — R279 Unspecified lack of coordination: Secondary | ICD-10-CM | POA: Diagnosis not present

## 2014-03-25 DIAGNOSIS — I69959 Hemiplegia and hemiparesis following unspecified cerebrovascular disease affecting unspecified side: Secondary | ICD-10-CM | POA: Diagnosis not present

## 2014-03-25 DIAGNOSIS — R269 Unspecified abnormalities of gait and mobility: Secondary | ICD-10-CM | POA: Diagnosis not present

## 2014-03-26 ENCOUNTER — Non-Acute Institutional Stay (SKILLED_NURSING_FACILITY): Payer: Medicare Other | Admitting: Adult Health

## 2014-03-26 DIAGNOSIS — F17200 Nicotine dependence, unspecified, uncomplicated: Secondary | ICD-10-CM

## 2014-03-26 DIAGNOSIS — I639 Cerebral infarction, unspecified: Secondary | ICD-10-CM

## 2014-03-26 DIAGNOSIS — E785 Hyperlipidemia, unspecified: Secondary | ICD-10-CM

## 2014-03-26 DIAGNOSIS — F419 Anxiety disorder, unspecified: Secondary | ICD-10-CM

## 2014-03-26 DIAGNOSIS — I2510 Atherosclerotic heart disease of native coronary artery without angina pectoris: Secondary | ICD-10-CM

## 2014-03-26 DIAGNOSIS — E1165 Type 2 diabetes mellitus with hyperglycemia: Secondary | ICD-10-CM

## 2014-03-26 DIAGNOSIS — F411 Generalized anxiety disorder: Secondary | ICD-10-CM

## 2014-03-26 DIAGNOSIS — I69959 Hemiplegia and hemiparesis following unspecified cerebrovascular disease affecting unspecified side: Secondary | ICD-10-CM

## 2014-03-26 DIAGNOSIS — E1149 Type 2 diabetes mellitus with other diabetic neurological complication: Secondary | ICD-10-CM

## 2014-03-26 DIAGNOSIS — M6281 Muscle weakness (generalized): Secondary | ICD-10-CM | POA: Diagnosis not present

## 2014-03-26 DIAGNOSIS — G9050 Complex regional pain syndrome I, unspecified: Secondary | ICD-10-CM

## 2014-03-26 DIAGNOSIS — G4700 Insomnia, unspecified: Secondary | ICD-10-CM

## 2014-03-26 DIAGNOSIS — I6350 Cerebral infarction due to unspecified occlusion or stenosis of unspecified cerebral artery: Secondary | ICD-10-CM

## 2014-03-26 DIAGNOSIS — E038 Other specified hypothyroidism: Secondary | ICD-10-CM

## 2014-03-26 DIAGNOSIS — I10 Essential (primary) hypertension: Secondary | ICD-10-CM

## 2014-03-26 DIAGNOSIS — R269 Unspecified abnormalities of gait and mobility: Secondary | ICD-10-CM | POA: Diagnosis not present

## 2014-03-26 DIAGNOSIS — IMO0002 Reserved for concepts with insufficient information to code with codable children: Secondary | ICD-10-CM

## 2014-03-26 DIAGNOSIS — I69354 Hemiplegia and hemiparesis following cerebral infarction affecting left non-dominant side: Secondary | ICD-10-CM

## 2014-03-26 DIAGNOSIS — R279 Unspecified lack of coordination: Secondary | ICD-10-CM | POA: Diagnosis not present

## 2014-03-26 DIAGNOSIS — R609 Edema, unspecified: Secondary | ICD-10-CM

## 2014-03-27 ENCOUNTER — Encounter: Payer: Self-pay | Admitting: Adult Health

## 2014-03-27 DIAGNOSIS — E139 Other specified diabetes mellitus without complications: Secondary | ICD-10-CM | POA: Diagnosis not present

## 2014-03-27 DIAGNOSIS — R269 Unspecified abnormalities of gait and mobility: Secondary | ICD-10-CM | POA: Diagnosis not present

## 2014-03-27 DIAGNOSIS — I69959 Hemiplegia and hemiparesis following unspecified cerebrovascular disease affecting unspecified side: Secondary | ICD-10-CM | POA: Diagnosis not present

## 2014-03-27 DIAGNOSIS — M6281 Muscle weakness (generalized): Secondary | ICD-10-CM | POA: Diagnosis not present

## 2014-03-27 DIAGNOSIS — R279 Unspecified lack of coordination: Secondary | ICD-10-CM | POA: Diagnosis not present

## 2014-03-27 MED ORDER — SIMETHICONE 80 MG PO CHEW: 160.0000 mg | CHEWABLE_TABLET | Freq: Three times a day (TID) | ORAL | Status: DC

## 2014-03-27 MED ORDER — BACLOFEN 10 MG PO TABS: 10.0000 mg | ORAL_TABLET | Freq: Three times a day (TID) | ORAL | Status: DC

## 2014-03-27 NOTE — Progress Notes (Signed)
Patient ID: Susan [name] Susan [name]), female   DOB: October 03, 1950, 63 y.o.   MRN: [id number]     [facility]  Allergies  Allergen Reactions  . Niaspan [Niacin Er]   . Other     onions  . Valium [Diazepam]   . Wellbutrin [Bupropion]      Chief Complaint  Patient presents with  . Acute Visit    abdominal distention     HPI:  No problem-specific assessment & plan notes found for this encounter.   Past Medical History  Diagnosis Date  . Stroke   . Hyperlipidemia   . Diabetes mellitus without complication   . COPD (chronic obstructive pulmonary disease)   . Depression   . Hypertension   . Thyroid disease   . Anemia   . CAD (coronary artery disease)   . Chronic pain   . Hemiplegia affecting non-dominant side, post-stroke   . Fall   . Urine retention     Past Surgical History  Procedure Laterality Date  . Joint replacement      bil hip  . Abdominal hysterectomy    . Cholecystectomy    . Appendectomy    . Tonsillectomy      VITAL SIGNS BP 119/60  Pulse 84  Ht 5\' 3"  (1.6 m)  Wt 182 lb (82.555 kg)  BMI 32.25 kg/m2   Patient's Medications  New Prescriptions   No medications on file  Previous Medications   ACLIDINIUM BROMIDE (TUDORZA PRESSAIR IN)    Inhale 2 puffs into the lungs daily.   ALBUTEROL (PROVENTIL HFA;VENTOLIN HFA) 108 (90 BASE) MCG/ACT INHALER    Inhale 1 puff into the lungs every 6 (six) hours as needed for wheezing or shortness of breath.   AMITRIPTYLINE (ELAVIL) 75 MG TABLET    Take 1 tablet (75 mg total) by mouth at bedtime.   CLONAZEPAM (KLONOPIN) 0.5 MG TABLET    Take 0.5 mg by mouth every 6 (six) hours as needed for anxiety.   DICLOFENAC SODIUM (VOLTAREN) 1 % GEL    Apply 2 g topically at bedtime. To both feet   DILTIAZEM (CARDIZEM CD) 120 MG 24 HR CAPSULE    Take 120 mg by mouth daily.   DULOXETINE (CYMBALTA) 60 MG CAPSULE    Take 1 capsule (60 mg total) by mouth daily.   FUROSEMIDE (LASIX) 20 MG TABLET    Take 2 tablets (40 mg total) by mouth  daily.   GABAPENTIN (NEURONTIN) 600 MG TABLET    Take 600 mg by mouth 3 (three) times daily.   INSULIN DETEMIR (LEVEMIR) 100 UNIT/ML INJECTION    Inject 0.6 mLs (60 Units total) into the skin at bedtime.   INSULIN LISPRO (HUMALOG) 100 UNIT/ML INJECTION    Inject 0.05 mLs (5 Units total) into the skin 3 (three) times daily before meals. 5 units prior to meals with an additional 5 units for cbg>=150   ISOSORBIDE MONONITRATE (IMDUR) 30 MG 24 HR TABLET    Take 30 mg by mouth daily.   LEVOTHYROXINE (SYNTHROID, LEVOTHROID) 25 MCG TABLET    Take 25 mcg by mouth daily before breakfast.   MELATONIN 3 MG TABS    Take 6 mg by mouth at bedtime.   NICOTINE (NICODERM CQ - DOSED IN MG/24 HOURS) 21 MG/24HR PATCH    Place 21 mg onto the skin daily.   OMEGA-3 ACID ETHYL ESTERS (LOVAZA) 1 G CAPSULE    Take 2 g by mouth daily.   OXYCODONE (ROXICODONE) 15 MG IMMEDIATE  RELEASE TABLET    Take one tablet by mouth every six hours for pain   POLYETHYLENE GLYCOL POWDER (GLYCOLAX/MIRALAX) POWDER    Take 17 g by mouth daily.   ROSUVASTATIN (CRESTOR) 40 MG TABLET    Take 40 mg by mouth every evening.    SENNA-DOCUSATE (SENOKOT-S) 8.6-50 MG PER TABLET    Take 2 tablets by mouth daily.   SPIRONOLACTONE (ALDACTONE) 25 MG TABLET    Take 25 mg by mouth daily.  Modified Medications   No medications on file  Discontinued Medications   No medications on file    SIGNIFICANT DIAGNOSTIC EXAMS   02-12-14: left upper extremity doppler: negative for dvt  02-12-14: chest x-ray: no acute disease; negative for chf  02-12-14: kub: 1. Colonic constipation or feces stasis 2. Nonspecific notable gaseous gastric distention chronicity underlying etiology known. 3. No other acute abnormality  02-14-14: left hip x-ray: hairline break in the cortex at the base of the lesser trochanter   02-14-14: left hip x-ray at ED: 1. No acute fracture or malalignment. 2. Heterotopic ossification the extends from the left anterior inferior iliac spine toward the  greater trochanter. 3. Cortical buttressing and mild periosteal reaction along the lateral aspect of the left femoral diaphysis adjacent to the distal tip of the femoral stem suggests stress reaction.    03-12-14: left hip xray: s/p left total hip arthroplasty, no acute osseous abnormality  03-12-14: abd xray: correlate clinically for significant colonic constipation or fecal stasis, no other acute abd abnormality evident  LABS REVIEWED:   01-05-14: urine culture: no growth 01-06-14: wbc 9.4; hgb 10.8; hct 35.5; mcv 98.3 plt 319; glucose 313; bun 33; creat 0.5; k+4.2; na++137 liver normal albumin 3.0 chol 166; ldl 104; trig 156; tsh 4.429  hgb a1c 9.3 02-12-14: wbc 7.6; hgb 10.4; hct 32.2; mcv 93; plt 288; glucose 242; bun 17; creat 0.72; k+4.1; na++137 Urine culture: no growth, lupus panel mostly negative except ANA pos 02-14-14: wbc 10.9; hgb 7.7; hct 24.4; mcv 94.2; plt 347; glucose 144; bun 13; creat 0.47; k+3.9; na++136; liver normal albumin 2.7; BNP 1090 02-17-14: BNP 24  03/03/14:glcuose 277; bun 22; creat 0.8; k+4.2; na++134   03/06/14: UA: 50,000 of Proteus Mirabilis 03/10/14:wbc 7.8; hgb 10.5; hct  mcv 95.1; plt 308; glucose 308; bun 27; creat 0.6; na++ 132 Liver normal albumin 3.1 amylase 12; lipase 7      Review of Systems  Constitutional: Negative for malaise/fatigue.  Respiratory: Negative for cough and shortness of breath.   Cardiovascular: Positive for leg swelling. Negative for chest pain and palpitations.  Gastrointestinal: Positive for constipation. Negative for heartburn.  Musculoskeletal: Positive for joint pain and myalgias.  Skin: Negative.   Psychiatric/Behavioral: Negative for depression. The patient is not nervous/anxious.      Physical Exam  Constitutional: She is oriented to person, place, and time. No distress.  obese  Neck: Neck supple. No JVD present.  Cardiovascular: Normal rate, regular rhythm and intact distal pulses.   Respiratory: Effort normal and breath  sounds normal. No respiratory distress. She has no wheezes.  GI: Bowel sounds are normal. She exhibits distension. There is no tenderness.  Abdomen is firm and distended; is tympanic to percussion.   Musculoskeletal: She exhibits edema.  Has continued increased muscle tone to bilateral lower extremities.   Neurological: She is alert and oriented to person, place, and time.  Skin: Skin is warm and dry. She is not diaphoretic.     ASSESSMENT/ PLAN:  1. Gastric distention:  will begin gas x 160 mg three times daily and will monitor   2. Hemiparesis to due to cva: will increase her baclofen to 10 mg three times daily and will monitor her status; will continue therapy as directed.

## 2014-04-02 ENCOUNTER — Encounter: Payer: Self-pay | Admitting: Adult Health

## 2014-04-02 MED ORDER — LINACLOTIDE 145 MCG PO CAPS: 145.0000 ug | ORAL_CAPSULE | Freq: Every day | ORAL | Status: DC

## 2014-04-02 NOTE — Progress Notes (Signed)
Patient ID: Dario Guardian Corinna Capra), female   DOB: 17-Sep-1950, 63 y.o.   MRN: [id number]     [facility]  Allergies  Allergen Reactions  . Niaspan [Niacin Er]   . Other     onions  . Valium [Diazepam]   . Wellbutrin [Bupropion]      Chief Complaint  Patient presents with  . Medical Management of Chronic Issues    HPI:  She is being seen for the management of her chronic illnesses and for a family meeting. Her sister has numerous concerns; wants [facility] on hospice care; for which she does not qualify for; her sister had a difficult time understanding this. Her sister was demanding that a diabetic diet be enforced; we had a prolong discussion about this as well; the discussion included patient rights to choose. She does continue to have pain management issues. At this time; more narcotics are not necessarily the answer for her.    Past Medical History  Diagnosis Date  . Stroke   . Hyperlipidemia   . Diabetes mellitus without complication   . COPD (chronic obstructive pulmonary disease)   . Depression   . Hypertension   . Thyroid disease   . Anemia   . CAD (coronary artery disease)   . Chronic pain   . Hemiplegia affecting non-dominant side, post-stroke   . Fall   . Urine retention     Past Surgical History  Procedure Laterality Date  . Joint replacement      bil hip  . Abdominal hysterectomy    . Cholecystectomy    . Appendectomy    . Tonsillectomy      VITAL SIGNS BP 133/80  Pulse 70  Ht 5\' 3"  (1.6 m)  Wt 182 lb (82.555 kg)  BMI 32.25 kg/m2   Patient's Medications  New Prescriptions   No medications on file  Previous Medications   ACLIDINIUM BROMIDE (TUDORZA PRESSAIR IN)    Inhale 2 puffs into the lungs daily.   ALBUTEROL (PROVENTIL HFA;VENTOLIN HFA) 108 (90 BASE) MCG/ACT INHALER    Inhale 1 puff into the lungs every 6 (six) hours as needed for wheezing or shortness of breath.   AMITRIPTYLINE (ELAVIL) 75 MG TABLET    Take 1 tablet (75 mg total) by mouth at  bedtime.   BACLOFEN (LIORESAL) 10 MG TABLET    Take 1 tablet (10 mg total) by mouth 3 (three) times daily.   CLONAZEPAM (KLONOPIN) 0.5 MG TABLET    Take 0.5 mg by mouth every 6 (six) hours as needed for anxiety.   DICLOFENAC SODIUM (VOLTAREN) 1 % GEL    Apply 2 g topically at bedtime. To both feet   DILTIAZEM (CARDIZEM CD) 120 MG 24 HR CAPSULE    Take 120 mg by mouth daily.   DULOXETINE (CYMBALTA) 60 MG CAPSULE    Take 1 capsule (60 mg total) by mouth daily.   FUROSEMIDE (LASIX) 20 MG TABLET    Take 2 tablets (40 mg total) by mouth daily.   GABAPENTIN (NEURONTIN) 600 MG TABLET    Take 600 mg by mouth 3 (three) times daily.   INSULIN DETEMIR (LEVEMIR) 100 UNIT/ML INJECTION    Inject 0.6 mLs (60 Units total) into the skin at bedtime.   INSULIN LISPRO (HUMALOG) 100 UNIT/ML INJECTION    12 units with meals with an additional 5 units for cbg >=150    ISOSORBIDE MONONITRATE (IMDUR) 30 MG 24 HR TABLET    Take 30 mg by mouth daily.  LEVOTHYROXINE (SYNTHROID, LEVOTHROID) 25 MCG TABLET    Take 25 mcg by mouth daily before breakfast.   LINAGLIPTIN (TRADJENTA) 5 MG TABS TABLET    Take 5 mg by mouth daily.   MELATONIN 3 MG TABS    Take 6 mg by mouth at bedtime.   NICOTINE (NICODERM CQ - DOSED IN MG/24 HOURS) 21 MG/24HR PATCH    Place 21 mg onto the skin daily.   OMEGA-3 ACID ETHYL ESTERS (LOVAZA) 1 G CAPSULE    Take 2 g by mouth daily.   OXYCODONE (ROXICODONE) 15 MG IMMEDIATE RELEASE TABLET    Take one tablet by mouth every six hours for pain   POLYETHYLENE GLYCOL POWDER (GLYCOLAX/MIRALAX) POWDER    Take 17 g by mouth daily.   ROSUVASTATIN (CRESTOR) 40 MG TABLET    Take 40 mg by mouth every evening.    SENNA-DOCUSATE (SENOKOT-S) 8.6-50 MG PER TABLET    Take 2 tablets by mouth daily.   SIMETHICONE (GAS-X) 80 MG CHEWABLE TABLET    Chew 2 tablets (160 mg total) by mouth 3 (three) times daily.   SPIRONOLACTONE (ALDACTONE) 25 MG TABLET    Take 25 mg by mouth daily.  Modified Medications   No medications on  file  Discontinued Medications   No medications on file    SIGNIFICANT DIAGNOSTIC EXAMS    02-12-14: left upper extremity doppler: negative for dvt  02-12-14: chest x-ray: no acute disease; negative for chf  02-12-14: kub: 1. Colonic constipation or feces stasis 2. Nonspecific notable gaseous gastric distention chronicity underlying etiology known. 3. No other acute abnormality  02-14-14: left hip x-ray: hairline break in the cortex at the base of the lesser trochanter   02-14-14: left hip x-ray at ED: 1. No acute fracture or malalignment. 2. Heterotopic ossification the extends from the left anterior inferior iliac spine toward the greater trochanter. 3. Cortical buttressing and mild periosteal reaction along the lateral aspect of the left femoral diaphysis adjacent to the distal tip of the femoral stem suggests stress reaction.    03-12-14: left hip xray: s/p left total hip arthroplasty, no acute osseous abnormality  03-12-14: abd xray: correlate clinically for significant colonic constipation or fecal stasis, no other acute abd abnormality evident   LABS REVIEWED:   01-05-14: urine culture: no growth 01-06-14: wbc 9.4; hgb 10.8; hct 35.5; mcv 98.3 plt 319; glucose 313; bun 33; creat 0.5; k+4.2; na++137 liver normal albumin 3.0 chol 166; ldl 104; trig 156; tsh 4.429  hgb a1c 9.3 02-12-14: wbc 7.6; hgb 10.4; hct 32.2; mcv 93; plt 288; glucose 242; bun 17; creat 0.72; k+4.1; na++137 Urine culture: no growth, lupus panel mostly negative except ANA pos 02-14-14: wbc 10.9; hgb 7.7; hct 24.4; mcv 94.2; plt 347; glucose 144; bun 13; creat 0.47; k+3.9; na++136; liver normal albumin 2.7; BNP 1090 02-17-14: BNP 24  03/03/14:glcuose 277; bun 22; creat 0.8; k+4.2; na++134   03/06/14: UA: 50,000 of Proteus Mirabilis 03/10/14:wbc 7.8; hgb 10.5; hct  mcv 95.1; plt 308; glucose 308; bun 27; creat 0.6; na++ 132 Liver normal albumin 3.1 amylase 12; lipase 7  03-19-14: glcuose 348; bun 27; creat 0.7; k+4.4; na++133       Review of Systems  Constitutional: Negative for malaise/fatigue.  Respiratory: Negative for cough and shortness of breath.   Cardiovascular: Positive for leg swelling. Negative for chest pain and palpitations.  Gastrointestinal: Positive for constipation. Negative for heartburn.  Musculoskeletal: Positive for joint pain and myalgias.  Skin: Negative.   Psychiatric/Behavioral: Negative  for depression. The patient is not nervous/anxious.      Physical Exam  Constitutional: She is oriented to person, place, and time. No distress.  obese  Neck: Neck supple. No JVD present.  Cardiovascular: Normal rate, regular rhythm and intact distal pulses.   Respiratory: Effort normal and breath sounds normal. No respiratory distress. She has no wheezes.  GI: Bowel sounds are normal. She exhibits distension. There is no tenderness.  Abdomen is firm and distended; is tympanic to percussion this is improving.    Musculoskeletal: She exhibits edema.  Has continued increased muscle tone to bilateral lower extremities.   Neurological: She is alert and oriented to person, place, and time.  Skin: Skin is warm and dry. She is not diaphoretic.      ASSESSMENT/ PLAN:  1. Diabetes: her status remains unchanged; will continue levemir 60 units nightly; will continue tradjenta 5 mg daily; will continue humalog 12 units with meals with an additional 5 units for cbg >=150. Will begin metformin 500 mg twice daily with meals and will monitor   2. RSD: she has chronic pain present will continue her cymbalta 60 mg daily; will continue elavil 75 mg nightly; neurontin 600 mg three time daily; oxycodone 15 mg every 6 hours routinely and will increase her voltaren gel to her fee to twice daily and will set up a consult for her at the pain management clinic will monitor her status.   3. Copd: is stable will continue tudorza 2 puffs daily and albuterol 1 puff every 6 hours as needed and will monitor her status   4.  CAD; no complaints of chest pain present; will continue imdur 30 mg daily; and will ,monitor  5. Hypertension: is stable will continue cardizem cd 120 mg daily and will monitor  6. Dyslipidemia: will continue crestor 40 mg daily and lovaza 2 gm daily her ldl is 104 and trig 154   7. Hypothyroidism: will continue synthroid 25 mcg daily  8. Anxiety; will continue  cymbalta 60 mg daily and klonopin 0.5 mg every 6 hours as needed and will monitor   9. Insomnia: will continue melatonin 6 mg nighlty   10. Smoker: will lower her patch to 14 mg for 3 days then 7 mg for 3 days them stop   11. Constipation: will begin linzess 145 mcg daily; will continue gas x 160 mg three times daily for bloating will stop the miralax   12. Hemiparesis as late effect cva; will continue her baclofen 10 mg three times daily for her spasticity and will continue to monitor her status. Will continue therapy as directed.   13. Edema: is presently stable will continue lasix 40 mg daily and aldactone 25 mg daily and will monitor   Will check urine for micro-albumin    Time spent with patient 60 minutes.

## 2014-04-07 DIAGNOSIS — E119 Type 2 diabetes mellitus without complications: Secondary | ICD-10-CM | POA: Diagnosis not present

## 2014-04-07 LAB — HEMOGLOBIN A1C: Hgb A1c MFr Bld: 10.5 % — AB (ref 4.0–6.0)

## 2014-04-09 DIAGNOSIS — A0472 Enterocolitis due to Clostridium difficile, not specified as recurrent: Secondary | ICD-10-CM | POA: Diagnosis not present

## 2014-04-13 ENCOUNTER — Encounter: Payer: Self-pay | Admitting: Adult Health

## 2014-04-13 ENCOUNTER — Non-Acute Institutional Stay (SKILLED_NURSING_FACILITY): Payer: Medicare Other | Admitting: Adult Health

## 2014-04-13 DIAGNOSIS — R609 Edema, unspecified: Secondary | ICD-10-CM

## 2014-04-13 DIAGNOSIS — E1149 Type 2 diabetes mellitus with other diabetic neurological complication: Secondary | ICD-10-CM

## 2014-04-13 DIAGNOSIS — IMO0002 Reserved for concepts with insufficient information to code with codable children: Secondary | ICD-10-CM

## 2014-04-13 DIAGNOSIS — K5909 Other constipation: Secondary | ICD-10-CM

## 2014-04-13 DIAGNOSIS — E1165 Type 2 diabetes mellitus with hyperglycemia: Secondary | ICD-10-CM

## 2014-04-13 DIAGNOSIS — M792 Neuralgia and neuritis, unspecified: Secondary | ICD-10-CM

## 2014-04-13 NOTE — Progress Notes (Signed)
Patient ID: Susan [name] Susan [name]), female   DOB: 05-09-1951, 63 y.o.   MRN: [id number]     [facility]  Allergies  Allergen Reactions  . Niaspan [Niacin Er]   . Other     onions  . Valium [Diazepam]   . Wellbutrin [Bupropion]      Chief Complaint  Patient presents with  . elevated blood sugars    HPI:  This is a 63 y.o. Female with a h/o CVA with left sided hemiparesis. She has uncontrolled DM Type 2. Her blood sugars have been running 200-300 through out the day. This is improved from her last visit.  Her A1C has increased to 10.5. She was started on metformin 500mg  BID on 03/26/14.  She reports that she was taking 1 gram BID at home prior to her admission. The staff reports that she is not compliant with a diabetic diet. She has chronic constipation with abd distention. She was started on Linzess on 03/26/14.  She reports that the abd distention is better but not resolved. This has been an ongoing issues and previous lab work has been unrevealing. It was felt this is most likely due to her sedentary life style and chronic opiate use. She has chronic edema to her body and limbs. She does not have a hx of significant cardiac hx. Her last alb was 3.1 and she is on prostat. She denies CP, SOB, or DOE (outside her norm).   Past Medical History  Diagnosis Date  . Stroke   . Hyperlipidemia   . Diabetes mellitus without complication   . COPD (chronic obstructive pulmonary disease)   . Depression   . Hypertension   . Thyroid disease   . Anemia   . CAD (coronary artery disease)   . Chronic pain   . Hemiplegia affecting non-dominant side, post-stroke   . Fall   . Urine retention     Past Surgical History  Procedure Laterality Date  . Joint replacement      bil hip  . Abdominal hysterectomy    . Cholecystectomy    . Appendectomy    . Tonsillectomy      VITAL SIGNS There were no vitals taken for this visit.   Patient's Medications  New Prescriptions   No medications on  file  Previous Medications   ACLIDINIUM BROMIDE (TUDORZA PRESSAIR IN)    Inhale 2 puffs into the lungs daily.   ALBUTEROL (PROVENTIL HFA;VENTOLIN HFA) 108 (90 BASE) MCG/ACT INHALER    Inhale 1 puff into the lungs every 6 (six) hours as needed for wheezing or shortness of breath.   AMITRIPTYLINE (ELAVIL) 75 MG TABLET    Take 1 tablet (75 mg total) by mouth at bedtime.   BACLOFEN (LIORESAL) 10 MG TABLET    Take 1 tablet (10 mg total) by mouth 3 (three) times daily.   CLONAZEPAM (KLONOPIN) 0.5 MG TABLET    Take 0.5 mg by mouth every 6 (six) hours as needed for anxiety.   DICLOFENAC SODIUM (VOLTAREN) 1 % GEL    Apply 2 g topically at bedtime. To both feet   DILTIAZEM (CARDIZEM CD) 120 MG 24 HR CAPSULE    Take 120 mg by mouth daily.   DULOXETINE (CYMBALTA) 60 MG CAPSULE    Take 1 capsule (60 mg total) by mouth daily.   FUROSEMIDE (LASIX) 20 MG TABLET    Take 2 tablets (40 mg total) by mouth daily.   GABAPENTIN (NEURONTIN) 600 MG TABLET    Take 600  mg by mouth 3 (three) times daily.   INSULIN DETEMIR (LEVEMIR) 100 UNIT/ML INJECTION    Inject 0.6 mLs (60 Units total) into the skin at bedtime.   INSULIN LISPRO (HUMALOG) 100 UNIT/ML INJECTION    Inject 512 Units into the skin 3 (three) times daily before meals. 5 units prior to meals with an additional 5 units for cbg>=150   ISOSORBIDE MONONITRATE (IMDUR) 30 MG 24 HR TABLET    Take 30 mg by mouth daily.   LEVOTHYROXINE (SYNTHROID, LEVOTHROID) 25 MCG TABLET    Take 25 mcg by mouth daily before breakfast.   LINACLOTIDE (LINZESS) 145 MCG CAPS CAPSULE    Take 1 capsule (145 mcg total) by mouth daily.   LINAGLIPTIN (TRADJENTA) 5 MG TABS TABLET    Take 5 mg by mouth daily.   MELATONIN 3 MG TABS    Take 6 mg by mouth at bedtime.   OMEGA-3 ACID ETHYL ESTERS (LOVAZA) 1 G CAPSULE    Take 2 g by mouth daily.   OXYCODONE (ROXICODONE) 15 MG IMMEDIATE RELEASE TABLET    Take one tablet by mouth every six hours for pain   ROSUVASTATIN (CRESTOR) 40 MG TABLET    Take 40  mg by mouth every evening.    SENNA-DOCUSATE (SENOKOT-S) 8.6-50 MG PER TABLET    Take 2 tablets by mouth daily.   SIMETHICONE (GAS-X) 80 MG CHEWABLE TABLET    Chew 2 tablets (160 mg total) by mouth 3 (three) times daily.   SPIRONOLACTONE (ALDACTONE) 25 MG TABLET    Take 25 mg by mouth daily.  Modified Medications   No medications on file  Discontinued Medications   No medications on file    SIGNIFICANT DIAGNOSTIC EXAMS   02-12-14: left upper extremity doppler: negative for dvt  02-12-14: chest x-ray: no acute disease; negative for chf  02-12-14: kub: 1. Colonic constipation or feces stasis 2. Nonspecific notable gaseous gastric distention chronicity underlying etiology known. 3. No other acute abnormality  02-14-14: left hip x-ray: hairline break in the cortex at the base of the lesser trochanter   02-14-14: left hip x-ray at ED: 1. No acute fracture or malalignment. 2. Heterotopic ossification the extends from the left anterior inferior iliac spine toward the greater trochanter. 3. Cortical buttressing and mild periosteal reaction along the lateral aspect of the left femoral diaphysis adjacent to the distal tip of the femoral stem suggests stress reaction.    03-12-14: left hip xray: s/p left total hip arthroplasty, no acute osseous abnormality  03-12-14: abd xray: correlate clinically for significant colonic constipation or fecal stasis, no other acute abd abnormality evident  LABS REVIEWED:   01-05-14: urine culture: no growth 01-06-14: wbc 9.4; hgb 10.8; hct 35.5; mcv 98.3 plt 319; glucose 313; bun 33; creat 0.5; k+4.2; na++137 liver normal albumin 3.0 chol 166; ldl 104; trig 156; tsh 4.429  hgb a1c 9.3 02-12-14: wbc 7.6; hgb 10.4; hct 32.2; mcv 93; plt 288; glucose 242; bun 17; creat 0.72; k+4.1; na++137 Urine culture: no growth, lupus panel mostly negative except ANA pos 02-14-14: wbc 10.9; hgb 7.7; hct 24.4; mcv 94.2; plt 347; glucose 144; bun 13; creat 0.47; k+3.9; na++136; liver normal albumin  2.7; BNP 1090 03/03/14: NA-134, K-4.2, CL-98, CO2-32, Glucose-277, BUN-22, Cr-0.8, Ca-8.9 02/17/14-BNP-24  03/06/14: UA: 50,000 of Proteus Mirabilis 03/10/14: WBC 7.8, Hgb 10.5, MCV-95.1, Plt-308, Eos-19.3%, Neut:48.9, Na-132, Cl-95, Co2-37, Glucose-308, BUN-27, Cr-0.6, Ca-8.6, Alb-3.1, AST-12, ALT-8, amylase-12, lipase-7 03/19/14: Na 133, K 4.4, Cl 95, CO2 31, BUN 27, Cr  0.7 04/07/14: Hgb A1C 10.5  ROS  DATA OBTAINED: from patient, nurse, medical record,  GENERAL: Appears well but reports pain with each visit. No change in appetite.  RESPIRATORY: No cough, wheezing, SOB CARDIAC: No chest pain, palpitations. Significant edema to abd, arms, legs GI:   No nausea,vomiting,diarrhea or constipation  No heartburn or reflux  MUSCULOSKELETAL:generalized pain.  NEUROLOGIC: No dizziness, fainting, headache, numbness No change in mental status   PHYSICAL ASSESSMENT  GENERAL APPEARANCE: No acute distress, appropriately groomed, large body habitus. Alert, pleasant, conversant. SKIN: No diaphoresis, rash, unusual lesions. Blanchable area of pressure to the right heal. Unblanchable area of pressure to the left heel. HEAD: Normocephalic, atraumatic EYES: Conjunctiva/lids clear. Pupils round, reactive. EOMs intact.  NECK: Supple, full ROM. No thyroid tenderness, enlargement or nodule LYMPHATICS: No head, neck or supraclavicular adenopathy RESPIRATORY: Breathing is even, unlabored. Lung sounds are clear and full.  CARDIOVASCULAR: Heart RRR. No murmur or extra heart sounds  EDEMA: edema to abd, legs and arms +2 GASTROINTESTINAL: Abdomen is slightly firm, non-tender, slightly distended with normal bsx4.  NEUROLOGIC: Oriented to time, place, person. Left sided hemiparesis PSYCHIATRIC: Mood and affect appropriate to situation  ASSESSMENT/ PLAN:  1) DM 2: minimal improvement with the addition of Tradgenta and increasing the Levemir. She reports that she was previously on Metformin 1 gram BID, will increase the  dose and follow her blood sugars. She continues to be non compliant with her diet per the staff.   2) Constipation: Her stomach is still firm but improved from the last visit.  She reports that she is having BMs but her bowels could be evacuated better. Will increase the Linzess to 290 mcg. Continue Gas X TID  3) Edema: She still appears to be carrying extra fluid. We are addressing her low albumin state with Prostat. She is on Lasix 40mg  daily, will continue this and increase the Aldactone to 25mg  BID. Check a BMP in a week. Her last BNP was normal. Consider drug effect and immobility   4) Chronic pain: We are awaiting an eval from the pain clinic. She is using oxycodone 15mg  q6 hrs. She continues to ask for this to be increased to q4 hrs. She has had issues in the past with oversedation and she has a hx of sleep apnea. I will not increase the interval at this time. We can increase the Cymbalta to 90mg  qd. If this does not help would decrease back due to the risk of s/e.   Ok Edwards NP/Christina Wert RN/MSN [facility]  Contact [phone] Monday through Friday 8am- 5pm  After hours call [phone]

## 2014-04-14 ENCOUNTER — Non-Acute Institutional Stay (SKILLED_NURSING_FACILITY): Payer: Medicare Other | Admitting: Adult Health

## 2014-04-14 DIAGNOSIS — K5900 Constipation, unspecified: Secondary | ICD-10-CM | POA: Diagnosis not present

## 2014-04-14 DIAGNOSIS — R197 Diarrhea, unspecified: Secondary | ICD-10-CM | POA: Diagnosis not present

## 2014-04-14 DIAGNOSIS — IMO0002 Reserved for concepts with insufficient information to code with codable children: Secondary | ICD-10-CM

## 2014-04-14 DIAGNOSIS — E1149 Type 2 diabetes mellitus with other diabetic neurological complication: Secondary | ICD-10-CM

## 2014-04-14 DIAGNOSIS — E1165 Type 2 diabetes mellitus with hyperglycemia: Principal | ICD-10-CM

## 2014-04-14 NOTE — Progress Notes (Signed)
Patient ID: Dario Guardian Corinna Capra), female   DOB: [birth year], 63 y.o.   MRN: [id number]     [facility]  Allergies  Allergen Reactions  . Niaspan [Niacin Er]   . Other     onions  . Valium [Diazepam]   . Wellbutrin [Bupropion]      Chief Complaint  Patient presents with  . Diarrhea    HPI:  This is a 63 y.o. Female with a h/o CVA with left sided hemiparesis.  I was asked to see her today for diarrhea. Yesterday she reported that that she was on metformin 1000 mg BID at home and so I adjusted her dose yesterday because her blood sugars were running high (200-300).  Today she is having some loose stools and nausea. She reports that she has been able to keep food down. She also has chronic constipation and on chronic opiate therapy. Yesterday her Linzess was increased to 290 mcg but this was not given due to the diarrhea.    Past Medical History  Diagnosis Date  . Stroke   . Hyperlipidemia   . Diabetes mellitus without complication   . COPD (chronic obstructive pulmonary disease)   . Depression   . Hypertension   . Thyroid disease   . Anemia   . CAD (coronary artery disease)   . Chronic pain   . Hemiplegia affecting non-dominant side, post-stroke   . Fall   . Urine retention     Past Surgical History  Procedure Laterality Date  . Joint replacement      bil hip  . Abdominal hysterectomy    . Cholecystectomy    . Appendectomy    . Tonsillectomy      Patient's Medications  New Prescriptions   No medications on file  Previous Medications   ACLIDINIUM BROMIDE (TUDORZA PRESSAIR IN)    Inhale 2 puffs into the lungs daily.   ALBUTEROL (PROVENTIL HFA;VENTOLIN HFA) 108 (90 BASE) MCG/ACT INHALER    Inhale 1 puff into the lungs every 6 (six) hours as needed for wheezing or shortness of breath.   AMITRIPTYLINE (ELAVIL) 75 MG TABLET    Take 1 tablet (75 mg total) by mouth at bedtime.   BACLOFEN (LIORESAL) 10 MG TABLET    Take 1 tablet (10 mg total) by mouth 3 (three) times  daily.   CLONAZEPAM (KLONOPIN) 0.5 MG TABLET    Take 0.5 mg by mouth every 6 (six) hours as needed for anxiety.   DICLOFENAC SODIUM (VOLTAREN) 1 % GEL    Apply 2 g topically at bedtime. To both feet   DILTIAZEM (CARDIZEM CD) 120 MG 24 HR CAPSULE    Take 120 mg by mouth daily.   DULOXETINE (CYMBALTA) 60 MG CAPSULE    Take 1 capsule (60 mg total) by mouth daily.   FUROSEMIDE (LASIX) 20 MG TABLET    Take 2 tablets (40 mg total) by mouth daily.   GABAPENTIN (NEURONTIN) 600 MG TABLET    Take 600 mg by mouth 3 (three) times daily.   INSULIN DETEMIR (LEVEMIR) 100 UNIT/ML INJECTION    Inject 0.6 mLs (60 Units total) into the skin at bedtime.   INSULIN LISPRO (HUMALOG) 100 UNIT/ML INJECTION    Inject 512 Units into the skin 3 (three) times daily before meals. 5 units prior to meals with an additional 5 units for cbg>=150   ISOSORBIDE MONONITRATE (IMDUR) 30 MG 24 HR TABLET    Take 30 mg by mouth daily.   LEVOTHYROXINE (SYNTHROID, LEVOTHROID)  25 MCG TABLET    Take 25 mcg by mouth daily before breakfast.   LINACLOTIDE (LINZESS) 145 MCG CAPS CAPSULE    Take 1 capsule (145 mcg total) by mouth daily.   LINAGLIPTIN (TRADJENTA) 5 MG TABS TABLET    Take 5 mg by mouth daily.   MELATONIN 3 MG TABS    Take 6 mg by mouth at bedtime.   OMEGA-3 ACID ETHYL ESTERS (LOVAZA) 1 G CAPSULE    Take 2 g by mouth daily.   OXYCODONE (ROXICODONE) 15 MG IMMEDIATE RELEASE TABLET    Take one tablet by mouth every six hours for pain   ROSUVASTATIN (CRESTOR) 40 MG TABLET    Take 40 mg by mouth every evening.    SENNA-DOCUSATE (SENOKOT-S) 8.6-50 MG PER TABLET    Take 2 tablets by mouth daily.   SIMETHICONE (GAS-X) 80 MG CHEWABLE TABLET    Chew 2 tablets (160 mg total) by mouth 3 (three) times daily.   SPIRONOLACTONE (ALDACTONE) 25 MG TABLET    Take 25 mg by mouth 2 (two) times daily.   Modified Medications   No medications on file  Discontinued Medications   No medications on file    SIGNIFICANT DIAGNOSTIC EXAMS   02-12-14: left  upper extremity doppler: negative for dvt  02-12-14: chest x-ray: no acute disease; negative for chf  02-12-14: kub: 1. Colonic constipation or feces stasis 2. Nonspecific notable gaseous gastric distention chronicity underlying etiology known. 3. No other acute abnormality  02-14-14: left hip x-ray: hairline break in the cortex at the base of the lesser trochanter   02-14-14: left hip x-ray at ED: 1. No acute fracture or malalignment. 2. Heterotopic ossification the extends from the left anterior inferior iliac spine toward the greater trochanter. 3. Cortical buttressing and mild periosteal reaction along the lateral aspect of the left femoral diaphysis adjacent to the distal tip of the femoral stem suggests stress reaction.    03-12-14: left hip xray: s/p left total hip arthroplasty, no acute osseous abnormality  03-12-14: abd xray: correlate clinically for significant colonic constipation or fecal stasis, no other acute abd abnormality evident  LABS REVIEWED:   01-05-14: urine culture: no growth 01-06-14: wbc 9.4; hgb 10.8; hct 35.5; mcv 98.3 plt 319; glucose 313; bun 33; creat 0.5; k+4.2; na++137 liver normal albumin 3.0 chol 166; ldl 104; trig 156; tsh 4.429  hgb a1c 9.3 02-12-14: wbc 7.6; hgb 10.4; hct 32.2; mcv 93; plt 288; glucose 242; bun 17; creat 0.72; k+4.1; na++137 Urine culture: no growth, lupus panel mostly negative except ANA pos 02-14-14: wbc 10.9; hgb 7.7; hct 24.4; mcv 94.2; plt 347; glucose 144; bun 13; creat 0.47; k+3.9; na++136; liver normal albumin 2.7; BNP 1090 03/03/14: NA-134, K-4.2, CL-98, CO2-32, Glucose-277, BUN-22, Cr-0.8, Ca-8.9 02/17/14-BNP-24  03/06/14: UA: 50,000 of Proteus Mirabilis 03/10/14: WBC 7.8, Hgb 10.5, MCV-95.1, Plt-308, Eos-19.3%, Neut:48.9, Na-132, Cl-95, Co2-37, Glucose-308, BUN-27, Cr-0.6, Ca-8.6, Alb-3.1, AST-12, ALT-8, amylase-12, lipase-7 03/19/14: Na 133, K 4.4, Cl 95, CO2 31, BUN 27, Cr 0.7 04/07/14: Hgb A1C 10.5  ROS  DATA OBTAINED: from patient, nurse,  medical record,  GENERAL: Appears well but reports pain with each visit. No change in appetite.  RESPIRATORY: No cough, wheezing, SOB CARDIAC: No chest pain, palpitations. Significant edema to abd, arms, legs GI:   Reports several loose stools today and nausea.  No heartburn or reflux  MUSCULOSKELETAL:generalized pain.  NEUROLOGIC: No dizziness, fainting, headache, numbness No change in mental status   PHYSICAL ASSESSMENT  GENERAL APPEARANCE: No  acute distress, appropriately groomed, large body habitus. Alert, pleasant, conversant. SKIN: No diaphoresis, rash, unusual lesions. Blanchable area of pressure to the right heal. Unblanchable area of pressure to the left heel. HEAD: Normocephalic, atraumatic EYES: Conjunctiva/lids clear. Pupils round, reactive. EOMs intact.  NECK: Supple, full ROM. No thyroid tenderness, enlargement or nodule LYMPHATICS: No head, neck or supraclavicular adenopathy RESPIRATORY: Breathing is even, unlabored. Lung sounds are clear and full.  CARDIOVASCULAR: Heart RRR. No murmur or extra heart sounds  EDEMA: edema to abd, legs and arms +2 GASTROINTESTINAL: Abdomen is slightly firm,slightly distended, not tender, BSx4 hyperactive NEUROLOGIC: Oriented to time, place, person. Left sided hemiparesis PSYCHIATRIC: Mood and affect appropriate to situation  ASSESSMENT/ PLAN:  1) Diarrhea: Most likely due to the Metformin. Will reduce back to 500mg  BID. Recheck a KUB. D/C senna s and continue colace  2) DM2: Continue current regimen would not adjust meds upwards while she is ill. F/U at next visit.   Ok Edwards NP/Christina Wert RN/MSN [facility]  Contact [phone] Monday through Friday 8am- 5pm  After hours call [phone]

## 2014-04-17 DIAGNOSIS — Z79899 Other long term (current) drug therapy: Secondary | ICD-10-CM | POA: Diagnosis not present

## 2014-04-17 LAB — CBC AND DIFFERENTIAL
HEMATOCRIT: 38 % (ref 36–46)
Hemoglobin: 11.6 g/dL — AB (ref 12.0–16.0)
PLATELETS: 269 10*3/uL (ref 150–399)
WBC: 8.1 10^3/mL

## 2014-04-23 ENCOUNTER — Ambulatory Visit: Payer: Self-pay | Admitting: Internal Medicine

## 2014-04-23 ENCOUNTER — Non-Acute Institutional Stay (SKILLED_NURSING_FACILITY): Payer: Medicare Other | Admitting: Adult Health

## 2014-04-23 DIAGNOSIS — J449 Chronic obstructive pulmonary disease, unspecified: Secondary | ICD-10-CM | POA: Diagnosis not present

## 2014-04-23 DIAGNOSIS — I10 Essential (primary) hypertension: Secondary | ICD-10-CM | POA: Diagnosis not present

## 2014-04-23 DIAGNOSIS — IMO0002 Reserved for concepts with insufficient information to code with codable children: Secondary | ICD-10-CM

## 2014-04-23 DIAGNOSIS — I69959 Hemiplegia and hemiparesis following unspecified cerebrovascular disease affecting unspecified side: Secondary | ICD-10-CM

## 2014-04-23 DIAGNOSIS — R609 Edema, unspecified: Secondary | ICD-10-CM

## 2014-04-23 DIAGNOSIS — I2510 Atherosclerotic heart disease of native coronary artery without angina pectoris: Secondary | ICD-10-CM

## 2014-04-23 DIAGNOSIS — K5909 Other constipation: Secondary | ICD-10-CM

## 2014-04-23 DIAGNOSIS — I69354 Hemiplegia and hemiparesis following cerebral infarction affecting left non-dominant side: Secondary | ICD-10-CM

## 2014-04-23 DIAGNOSIS — E1149 Type 2 diabetes mellitus with other diabetic neurological complication: Secondary | ICD-10-CM | POA: Diagnosis not present

## 2014-04-23 DIAGNOSIS — F411 Generalized anxiety disorder: Secondary | ICD-10-CM

## 2014-04-23 DIAGNOSIS — M792 Neuralgia and neuritis, unspecified: Secondary | ICD-10-CM

## 2014-04-23 DIAGNOSIS — E1165 Type 2 diabetes mellitus with hyperglycemia: Secondary | ICD-10-CM

## 2014-04-23 DIAGNOSIS — E785 Hyperlipidemia, unspecified: Secondary | ICD-10-CM

## 2014-04-23 DIAGNOSIS — E038 Other specified hypothyroidism: Secondary | ICD-10-CM

## 2014-04-23 DIAGNOSIS — F419 Anxiety disorder, unspecified: Secondary | ICD-10-CM

## 2014-04-24 DIAGNOSIS — E119 Type 2 diabetes mellitus without complications: Secondary | ICD-10-CM | POA: Diagnosis not present

## 2014-04-29 ENCOUNTER — Ambulatory Visit (HOSPITAL_COMMUNITY): Payer: Self-pay

## 2014-04-30 DIAGNOSIS — M5137 Other intervertebral disc degeneration, lumbosacral region: Secondary | ICD-10-CM | POA: Diagnosis not present

## 2014-04-30 DIAGNOSIS — Z79899 Other long term (current) drug therapy: Secondary | ICD-10-CM | POA: Diagnosis not present

## 2014-04-30 DIAGNOSIS — G894 Chronic pain syndrome: Secondary | ICD-10-CM | POA: Diagnosis not present

## 2014-04-30 DIAGNOSIS — M25569 Pain in unspecified knee: Secondary | ICD-10-CM | POA: Diagnosis not present

## 2014-05-10 ENCOUNTER — Encounter: Payer: Self-pay | Admitting: Adult Health

## 2014-05-10 NOTE — Progress Notes (Signed)
Patient ID: Susan [name]), female   DOB: [birth year], 63 y.o.   MRN: [id number]     [facility]  Allergies  Allergen Reactions  . Niaspan [Niacin Er]   . Other     onions  . Valium [Diazepam]   . Wellbutrin [Bupropion]      Chief Complaint  Patient presents with  . Medical Management of Chronic Issues    HPI:  She is a long term resident of this facility being seen for the management of her chronic illnesses. Overall her status remains without change. She is complaining of sinus congestion. She is awaiting a pain clinic appointment in the next 1-2 weeks. There are no concerns being voiced by the nursing staff at this time.    Past Medical History  Diagnosis Date  . Stroke   . Hyperlipidemia   . Diabetes mellitus without complication   . COPD (chronic obstructive pulmonary disease)   . Depression   . Hypertension   . Thyroid disease   . Anemia   . CAD (coronary artery disease)   . Chronic pain   . Hemiplegia affecting non-dominant side, post-stroke   . Fall   . Urine retention     Past Surgical History  Procedure Laterality Date  . Joint replacement      bil hip  . Abdominal hysterectomy    . Cholecystectomy    . Appendectomy    . Tonsillectomy      VITAL SIGNS BP 136/69  Pulse 75  Ht 5\' 3"  (1.6 m)  Wt 185 lb (83.915 kg)  BMI 32.78 kg/m2   Patient's Medications  New Prescriptions   No medications on file  Previous Medications   ACLIDINIUM BROMIDE (TUDORZA PRESSAIR IN)    Inhale 2 puffs into the lungs daily.   ALBUTEROL (PROVENTIL HFA;VENTOLIN HFA) 108 (90 BASE) MCG/ACT INHALER    Inhale 1 puff into the lungs every 6 (six) hours as needed for wheezing or shortness of breath.   CLONAZEPAM (KLONOPIN) 0.5 MG TABLET    Take 0.5 mg by mouth every 6 (six) hours as needed for anxiety.   DICLOFENAC SODIUM (VOLTAREN) 1 % GEL    Apply 2 g topically at bedtime. To both feet   DILTIAZEM (CARDIZEM CD) 120 MG 24 HR CAPSULE    Take 120 mg by mouth daily.   DOCUSATE SODIUM (COLACE) 100 MG CAPSULE    Take 100 mg by mouth 2 (two) times daily.   FUROSEMIDE (LASIX) 20 MG TABLET    Take 2 tablets (40 mg total) by mouth daily.   GABAPENTIN (NEURONTIN) 600 MG TABLET    Take 600 mg by mouth 3 (three) times daily.   INSULIN DETEMIR (LEVEMIR) 100 UNIT/ML INJECTION    Inject 0.6 mLs (60 Units total) into the skin at bedtime.   INSULIN LISPRO (HUMALOG) 100 UNIT/ML INJECTION    Inject 5-12 Units into the skin 3 (three) times daily before meals. 12 units prior to meals with an additional 5 units for cbg>=150   ISOSORBIDE MONONITRATE (IMDUR) 30 MG 24 HR TABLET    Take 30 mg by mouth daily.   LEVOTHYROXINE (SYNTHROID, LEVOTHROID) 25 MCG TABLET    Take 25 mcg by mouth daily before breakfast.   LINAGLIPTIN (TRADJENTA) 5 MG TABS TABLET    Take 5 mg by mouth daily.   LUBIPROSTONE (AMITIZA) 24 MCG CAPSULE    Take 24 mcg by mouth 2 (two) times daily with a meal.   MELATONIN 3 MG TABS  Take 6 mg by mouth at bedtime.   OMEGA-3 ACID ETHYL ESTERS (LOVAZA) 1 G CAPSULE    Take 2 g by mouth daily.   OXYCODONE (ROXICODONE) 15 MG IMMEDIATE RELEASE TABLET    Take one tablet by mouth every six hours for pain   ROSUVASTATIN (CRESTOR) 40 MG TABLET    Take 40 mg by mouth every evening.    SPIRONOLACTONE (ALDACTONE) 25 MG TABLET    Take 25 mg by mouth 2 (two) times daily.   Modified Medications   Modified Medication Previous Medication   AMITRIPTYLINE (ELAVIL) 75 MG TABLET amitriptyline (ELAVIL) 75 MG tablet      Take 100 mg by mouth at bedtime.    Take 1 tablet (75 mg total) by mouth at bedtime.   BACLOFEN (LIORESAL) 10 MG TABLET baclofen (LIORESAL) 10 MG tablet      Take 10 mg by mouth 4 (four) times daily.    Take 1 tablet (10 mg total) by mouth 3 (three) times daily.   DULOXETINE (CYMBALTA) 60 MG CAPSULE DULoxetine (CYMBALTA) 60 MG capsule      Take 90 mg by mouth daily.    Take 1 capsule (60 mg total) by mouth daily.   SIMETHICONE (MYLICON) 80 MG CHEWABLE TABLET simethicone  (GAS-X) 80 MG chewable tablet      Chew 160 mg by mouth 4 (four) times daily.    Chew 2 tablets (160 mg total) by mouth 3 (three) times daily.  Discontinued Medications   LINACLOTIDE (LINZESS) 145 MCG CAPS CAPSULE    Take 1 capsule (145 mcg total) by mouth daily.   SENNA-DOCUSATE (SENOKOT-S) 8.6-50 MG PER TABLET    Take 2 tablets by mouth daily.    SIGNIFICANT DIAGNOSTIC EXAMS  02-12-14: left upper extremity doppler: negative for dvt  02-12-14: chest x-ray: no acute disease; negative for chf  02-12-14: kub: 1. Colonic constipation or feces stasis 2. Nonspecific notable gaseous gastric distention chronicity underlying etiology known. 3. No other acute abnormality  02-14-14: left hip x-ray: hairline break in the cortex at the base of the lesser trochanter   02-14-14: left hip x-ray at ED: 1. No acute fracture or malalignment. 2. Heterotopic ossification the extends from the left anterior inferior iliac spine toward the greater trochanter. 3. Cortical buttressing and mild periosteal reaction along the lateral aspect of the left femoral diaphysis adjacent to the distal tip of the femoral stem suggests stress reaction.    03-12-14: left hip xray: s/p left total hip arthroplasty, no acute osseous abnormality  03-12-14: abd xray: correlate clinically for significant colonic constipation or fecal stasis, no other acute abd abnormality evident   LABS REVIEWED:   01-05-14: urine culture: no growth 01-06-14: wbc 9.4; hgb 10.8; hct 35.5; mcv 98.3 plt 319; glucose 313; bun 33; creat 0.5; k+4.2; na++137 liver normal albumin 3.0 chol 166; ldl 104; trig 156; tsh 4.429  hgb a1c 9.3 02-12-14: wbc 7.6; hgb 10.4; hct 32.2; mcv 93; plt 288; glucose 242; bun 17; creat 0.72; k+4.1; na++137 Urine culture: no growth, lupus panel mostly negative except ANA pos 02-14-14: wbc 10.9; hgb 7.7; hct 24.4; mcv 94.2; plt 347; glucose 144; bun 13; creat 0.47; k+3.9; na++136; liver normal albumin 2.7; BNP 1090 02-17-14: BNP 24    03/03/14:glcuose 277; bun 22; creat 0.8; k+4.2; na++134   03/06/14: UA: 50,000 of Proteus Mirabilis 03/10/14:wbc 7.8; hgb 10.5; hct  mcv 95.1; plt 308; glucose 308; bun 27; creat 0.6; na++ 132 Liver normal albumin 3.1 amylase 12; lipase 7  03-19-14: glcuose 348; bun 27; creat 0.7; k+4.4; na++133  04-07-14: hgb a1c 10.5 04-17-14: wbc 8.1; hgb 11.6; hct 38.3; mcv 88; plt 269      Review of Systems  Constitutional: Negative for malaise/fatigue.  Respiratory: Negative for cough and shortness of breath.  has sinus congestion Cardiovascular: Positive for leg swelling. Negative for chest pain and palpitations.  Gastrointestinal: negative for constipation. Negative for heartburn.  Musculoskeletal: Positive for joint pain and myalgias.  Skin: Negative.   Psychiatric/Behavioral: Negative for depression. The patient is not nervous/anxious.      Physical Exam  Constitutional: She is oriented to person, place, and time. No distress.  obese  Neck: Neck supple. No JVD present.  Cardiovascular: Normal rate, regular rhythm and intact distal pulses.   Respiratory: Effort normal and breath sounds normal. No respiratory distress. She has no wheezes.  GI: Bowel sounds are normal. She exhibits distension. There is no tenderness.  Abdomen is firm and distended; is tympanic to percussion this is improving.    Musculoskeletal: She exhibits edema.  Has continued increased muscle tone to bilateral lower extremities.   Neurological: She is alert and oriented to person, place, and time.  Skin: Skin is warm and dry. She is not diaphoretic.      ASSESSMENT/ PLAN:  1. Diabetes: her status remains unchanged; will increase levemir 60 units nightly and 20 units in the am; will continue tradjenta 5 mg daily; will increase  humalog to 16 units with meals with an additional 10  units for cbg >=150.  and will monitor   2. Chronic pain: she has chronic pain present will continue her cymbalta 90 mg daily; will continue  elavil 100 mg nightly; neurontin 600 mg three time daily; oxycodone 15 mg every 6 hours routinely and will continue her voltaren gel to her feet  twice daily will add oxycodone 5 mg daily as needed; awaiting pain clinic appointment  3. Copd: is stable will continue tudorza 2 puffs daily and albuterol 1 puff every 6 hours as needed and will monitor her status   4. CAD; no complaints of chest pain present; will continue imdur 30 mg daily; and will ,monitor  5. Hypertension: is stable will continue cardizem cd 120 mg daily and will monitor  6. Dyslipidemia: will continue crestor 40 mg daily and lovaza 2 gm daily her ldl is 104 and trig 154   7. Hypothyroidism: will continue synthroid 25 mcg daily  8. Anxiety; will continue  cymbalta 90 mg daily and klonopin 0.5 mg every 6 hours as needed and will monitor   9. Insomnia: will continue melatonin 6 mg nighlty   10. Smoker: not on medications will monitor   11. Constipation: the linzess was stopped; will continue amitiza 24 mcg twice daily ; will continue gas x 160 mg three times daily for bloating will continue colace twice daily   12. Hemiparesis as late effect cva; will continue her baclofen 10 mg four times daily for her spasticity and will continue to monitor her status.   13. Edema: is presently stable will continue lasix 40 mg daily and aldactone 25 mg twice daily and will monitor   14. Allergic rhinitis will begin flonase daily   Will check urine for micro-albumin    Time spent with patient 60 minutes.

## 2014-05-16 ENCOUNTER — Emergency Department (HOSPITAL_COMMUNITY): Payer: Medicare Other

## 2014-05-16 ENCOUNTER — Emergency Department (HOSPITAL_COMMUNITY)
Admission: EM | Admit: 2014-05-16 | Discharge: 2014-05-16 | Disposition: A | Discharge status: Home / Self Care | Payer: Medicare Other | Attending: Emergency Medicine | Admitting: Emergency Medicine

## 2014-05-16 ENCOUNTER — Encounter (HOSPITAL_COMMUNITY): Payer: Self-pay | Admitting: Emergency Medicine

## 2014-05-16 DIAGNOSIS — R000 Tachycardia, unspecified: Secondary | ICD-10-CM | POA: Diagnosis present

## 2014-05-16 DIAGNOSIS — F329 Major depressive disorder, single episode, unspecified: Secondary | ICD-10-CM | POA: Insufficient documentation

## 2014-05-16 DIAGNOSIS — R229 Localized swelling, mass and lump, unspecified: Secondary | ICD-10-CM | POA: Diagnosis not present

## 2014-05-16 DIAGNOSIS — Z8673 Personal history of transient ischemic attack (TIA), and cerebral infarction without residual deficits: Secondary | ICD-10-CM | POA: Diagnosis not present

## 2014-05-16 DIAGNOSIS — Z794 Long term (current) use of insulin: Secondary | ICD-10-CM | POA: Diagnosis not present

## 2014-05-16 DIAGNOSIS — M7989 Other specified soft tissue disorders: Secondary | ICD-10-CM | POA: Diagnosis not present

## 2014-05-16 DIAGNOSIS — C384 Malignant neoplasm of pleura: Secondary | ICD-10-CM | POA: Diagnosis not present

## 2014-05-16 DIAGNOSIS — Z87891 Personal history of nicotine dependence: Secondary | ICD-10-CM | POA: Diagnosis not present

## 2014-05-16 DIAGNOSIS — I498 Other specified cardiac arrhythmias: Secondary | ICD-10-CM | POA: Diagnosis not present

## 2014-05-16 DIAGNOSIS — IMO0002 Reserved for concepts with insufficient information to code with codable children: Secondary | ICD-10-CM | POA: Insufficient documentation

## 2014-05-16 DIAGNOSIS — E079 Disorder of thyroid, unspecified: Secondary | ICD-10-CM | POA: Insufficient documentation

## 2014-05-16 DIAGNOSIS — I471 Supraventricular tachycardia: Secondary | ICD-10-CM | POA: Diagnosis not present

## 2014-05-16 DIAGNOSIS — I10 Essential (primary) hypertension: Secondary | ICD-10-CM | POA: Insufficient documentation

## 2014-05-16 DIAGNOSIS — J441 Chronic obstructive pulmonary disease with (acute) exacerbation: Secondary | ICD-10-CM | POA: Diagnosis not present

## 2014-05-16 DIAGNOSIS — G40909 Epilepsy, unspecified, not intractable, without status epilepticus: Secondary | ICD-10-CM | POA: Diagnosis not present

## 2014-05-16 DIAGNOSIS — E119 Type 2 diabetes mellitus without complications: Secondary | ICD-10-CM | POA: Insufficient documentation

## 2014-05-16 DIAGNOSIS — K5900 Constipation, unspecified: Secondary | ICD-10-CM | POA: Insufficient documentation

## 2014-05-16 DIAGNOSIS — G8929 Other chronic pain: Secondary | ICD-10-CM | POA: Diagnosis not present

## 2014-05-16 DIAGNOSIS — Z862 Personal history of diseases of the blood and blood-forming organs and certain disorders involving the immune mechanism: Secondary | ICD-10-CM | POA: Insufficient documentation

## 2014-05-16 DIAGNOSIS — Z79899 Other long term (current) drug therapy: Secondary | ICD-10-CM | POA: Insufficient documentation

## 2014-05-16 DIAGNOSIS — F3289 Other specified depressive episodes: Secondary | ICD-10-CM | POA: Insufficient documentation

## 2014-05-16 DIAGNOSIS — I2510 Atherosclerotic heart disease of native coronary artery without angina pectoris: Secondary | ICD-10-CM | POA: Diagnosis not present

## 2014-05-16 DIAGNOSIS — E785 Hyperlipidemia, unspecified: Secondary | ICD-10-CM | POA: Insufficient documentation

## 2014-05-16 DIAGNOSIS — R6889 Other general symptoms and signs: Secondary | ICD-10-CM | POA: Diagnosis not present

## 2014-05-16 DIAGNOSIS — R079 Chest pain, unspecified: Secondary | ICD-10-CM | POA: Diagnosis not present

## 2014-05-16 LAB — CBC WITH DIFFERENTIAL/PLATELET
BASOS ABS: 0 10*3/uL (ref 0.0–0.1)
Basophils Relative: 1 % (ref 0–1)
EOS PCT: 4 % (ref 0–5)
Eosinophils Absolute: 0.3 10*3/uL (ref 0.0–0.7)
HEMATOCRIT: 36 % (ref 36.0–46.0)
HEMOGLOBIN: 11.2 g/dL — AB (ref 12.0–15.0)
LYMPHS ABS: 1.8 10*3/uL (ref 0.7–4.0)
Lymphocytes Relative: 27 % (ref 12–46)
MCH: 27.3 pg (ref 26.0–34.0)
MCHC: 31.1 g/dL (ref 30.0–36.0)
MCV: 87.6 fL (ref 78.0–100.0)
Monocytes Absolute: 0.4 10*3/uL (ref 0.1–1.0)
Monocytes Relative: 5 % (ref 3–12)
NEUTROS ABS: 4.2 10*3/uL (ref 1.7–7.7)
Neutrophils Relative %: 63 % (ref 43–77)
Platelets: 264 10*3/uL (ref 150–400)
RBC: 4.11 MIL/uL (ref 3.87–5.11)
RDW: 15.6 % — ABNORMAL HIGH (ref 11.5–15.5)
WBC: 6.7 10*3/uL (ref 4.0–10.5)

## 2014-05-16 LAB — URINALYSIS, ROUTINE W REFLEX MICROSCOPIC
Bilirubin Urine: NEGATIVE
Hgb urine dipstick: NEGATIVE
Ketones, ur: NEGATIVE mg/dL
LEUKOCYTES UA: NEGATIVE
Nitrite: NEGATIVE
PROTEIN: 30 mg/dL — AB
SPECIFIC GRAVITY, URINE: 1.013 (ref 1.005–1.030)
Urobilinogen, UA: 1 mg/dL (ref 0.0–1.0)
pH: 5.5 (ref 5.0–8.0)

## 2014-05-16 LAB — COMPREHENSIVE METABOLIC PANEL
ALK PHOS: 96 U/L (ref 39–117)
ALT: 11 U/L (ref 0–35)
AST: 14 U/L (ref 0–37)
Albumin: 3.1 g/dL — ABNORMAL LOW (ref 3.5–5.2)
Anion gap: 13 (ref 5–15)
BUN: 24 mg/dL — ABNORMAL HIGH (ref 6–23)
CALCIUM: 9.1 mg/dL (ref 8.4–10.5)
CO2: 30 mEq/L (ref 19–32)
Chloride: 94 mEq/L — ABNORMAL LOW (ref 96–112)
Creatinine, Ser: 0.59 mg/dL (ref 0.50–1.10)
GFR calc Af Amer: >90 mL/min (ref >90)
GFR calc non Af Amer: >90 mL/min (ref >90)
Glucose, Bld: 310 mg/dL — ABNORMAL HIGH (ref 70–99)
POTASSIUM: 4.3 meq/L (ref 3.7–5.3)
SODIUM: 137 meq/L (ref 137–147)
TOTAL PROTEIN: 7.8 g/dL (ref 6.0–8.3)
Total Bilirubin: 0.2 mg/dL — ABNORMAL LOW (ref 0.3–1.2)

## 2014-05-16 LAB — URINE MICROSCOPIC-ADD ON

## 2014-05-16 LAB — I-STAT TROPONIN, ED: Troponin i, poc: 0.06 ng/mL (ref 0.00–0.08)

## 2014-05-16 LAB — CBG MONITORING, ED: Glucose-Capillary: 202 mg/dL — ABNORMAL HIGH (ref 70–99)

## 2014-05-16 LAB — TROPONIN I: Troponin I: <0.3 ng/mL (ref <0.30)

## 2014-05-16 MED ORDER — OXYCODONE HCL 5 MG PO TABS
10.0000 mg | ORAL_TABLET | Freq: Once | ORAL | Status: AC
  Administered 2014-05-16: 10 mg via ORAL
  Filled 2014-05-16: qty 2

## 2014-05-16 NOTE — ED Notes (Signed)
This RN ordered a heart diet tray for the pt.

## 2014-05-16 NOTE — ED Notes (Addendum)
Per EMS, patient in SVT HR of 201, given 6 of adenosine with no change, gave 12 of adenosine, converted to sinus rhythm rate of 105. Pt. States initially c/o heart palpitations and SOB. Denies those complaints at this time.

## 2014-05-16 NOTE — ED Provider Notes (Addendum)
7:14 AM Accepted care from Dr. Lita Mains. 69F initially presented w/ SVT, converted w/ adenosine, found to have swelling to LE. DVT study ordered. Awaiting results. Plan for d/c home if neg.   9:17 AM: [place] neg for DVT. Pt continues to appear well. VS unremarkable. Will d/c home w/ return precautions and rec f/u w/ her cardiologist.   Clinical Impression 1. Paroxysmal SVT (supraventricular tachycardia)   2. Constipation, unspecified constipation type   3. Swelling of left lower extremity      Pamella Pert, MD 05/16/14 [place], MD 05/16/14 [id number]

## 2014-05-16 NOTE — Progress Notes (Signed)
VASCULAR LAB PRELIMINARY  PRELIMINARY  PRELIMINARY  PRELIMINARY  Right lower extremity venous Doppler completed.    Preliminary report:  There is no DVT or SVT noted in the right lower extremity.   Elia Keenum, RVT 05/16/2014, 8:43 AM

## 2014-05-16 NOTE — ED Notes (Signed)
This RN called report to [facility] where the pt lives. Nurse at [facility] told this RN that she was unsure if the pt would be able to make it back to the hospital for her vascular study. Pt has consented to stay until the study can be done.

## 2014-05-16 NOTE — Discharge Instructions (Signed)
Constipation °Constipation is when a person has fewer than three bowel movements a week, has difficulty having a bowel movement, or has stools that are dry, hard, or larger than normal. As people grow older, constipation is more common. If you try to fix constipation with medicines that make you have a bowel movement (laxatives), the problem may get worse. Long-term laxative use may cause the muscles of the colon to become weak. A low-fiber diet, not taking in enough fluids, and taking certain medicines may make constipation worse.  °CAUSES  °· Certain medicines, such as antidepressants, pain medicine, iron supplements, antacids, and water pills.   °· Certain diseases, such as diabetes, irritable bowel syndrome (IBS), thyroid disease, or depression.   °· Not drinking enough water.   °· Not eating enough fiber-rich foods.   °· Stress or travel.   °· Lack of physical activity or exercise.   °· Ignoring the urge to have a bowel movement.   °· Using laxatives too much.   °SIGNS AND SYMPTOMS  °· Having fewer than three bowel movements a week.   °· Straining to have a bowel movement.   °· Having stools that are hard, dry, or larger than normal.   °· Feeling full or bloated.   °· Pain in the lower abdomen.   °· Not feeling relief after having a bowel movement.   °DIAGNOSIS  °Your health care provider will take a medical history and perform a physical exam. Further testing may be done for severe constipation. Some tests may include: °· A barium enema X-ray to examine your rectum, colon, and, sometimes, your small intestine.   °· A sigmoidoscopy to examine your lower colon.   °· A colonoscopy to examine your entire colon. °TREATMENT  °Treatment will depend on the severity of your constipation and what is causing it. Some dietary treatments include drinking more fluids and eating more fiber-rich foods. Lifestyle treatments may include regular exercise. If these diet and lifestyle recommendations do not help, your health care  provider may recommend taking over-the-counter laxative medicines to help you have bowel movements. Prescription medicines may be prescribed if over-the-counter medicines do not work.  °HOME CARE INSTRUCTIONS  °· Eat foods that have a lot of fiber, such as fruits, vegetables, whole grains, and beans. °· Limit foods high in fat and processed sugars, such as french fries, hamburgers, cookies, candies, and soda.   °· A fiber supplement may be added to your diet if you cannot get enough fiber from foods.   °· Drink enough fluids to keep your urine clear or pale yellow.   °· Exercise regularly or as directed by your health care provider.   °· Go to the restroom when you have the urge to go. Do not hold it.   °· Only take over-the-counter or prescription medicines as directed by your health care provider. Do not take other medicines for constipation without talking to your health care provider first.   °SEEK IMMEDIATE MEDICAL CARE IF:  °· You have bright red blood in your stool.   °· Your constipation lasts for more than 4 days or gets worse.   °· You have abdominal or rectal pain.   °· You have thin, pencil-like stools.   °· You have unexplained weight loss. °MAKE SURE YOU:  °· Understand these instructions. °· Will watch your condition. °· Will get help right away if you are not doing well or get worse. °Document Released: 05/26/2004 Document Revised: 09/02/2013 Document Reviewed: 06/09/2013 °[employer]® Patient Information ©2015 [employer]. This information is not intended to replace advice given to you by your health care provider. Make sure you discuss any questions   you have with your health care provider.  Supraventricular Tachycardia Supraventricular tachycardia (SVT) is an abnormal heart rhythm (arrhythmia) that causes the heart to beat very fast (tachycardia). This kind of fast heartbeat originates in the upper chambers of the heart (atria). SVT can cause the heart to beat greater than 100 beats per minute.  SVT can have a rapid burst of heartbeats. This can start and stop suddenly without warning and is called nonsustained. SVT can also be sustained, in which the heart beats at a continuous fast rate.  CAUSES  There can be different causes of SVT. Some of these include:  Heart valve problems such as mitral valve prolapse.  An enlarged heart (hypertrophic cardiomyopathy).  Congenital heart problems.  Heart inflammation (pericarditis).  Hyperthyroidism.  Low potassium or magnesium levels.  Caffeine.  Drug use such as cocaine, methamphetamines, or stimulants.  Some over-the-counter medicines such as:  Decongestants.  Diet medicines.  Herbal medicines. SYMPTOMS  Symptoms of SVT can vary. Symptoms depend on whether the SVT is sustained or nonsustained. You may experience:  No symptoms (asymptomatic).  An awareness of your heart beating rapidly (palpitations).  Shortness of breath.  Chest pain or pressure. If your blood pressure drops because of the SVT, you may experience:  Fainting or near fainting.  Weakness.  Dizziness. DIAGNOSIS  Different tests can be performed to diagnose SVT, such as:  An electrocardiogram (EKG). This is a painless test that records the electrical activity of your heart.  Holter monitor. This is a 24 hour recording of your heart rhythm. You will be given a diary. Write down all symptoms that you have and what you were doing at the time you experienced symptoms.  Arrhythmia monitor. This is a small device that your wear for several weeks. It records the heart rhythm when you have symptoms.  Echocardiogram. This is an imaging test to help detect abnormal heart structure such as congenital abnormalities, heart valve problems, or heart enlargement.  Stress test. This test can help determine if the SVT is related to exercise.  Electrophysiology study (EPS). This is a procedure that evaluates your heart's electrical system and can help your caregiver  find the cause of your SVT. TREATMENT  Treatment of SVT depends on the symptoms, how often it recurs, and whether there are any underlying heart problems.   If symptoms are rare and no other cardiac disease is present, no treatment may be needed.  Blood work may be done to check potassium, magnesium, and thyroid hormone levels to see if they are abnormal. If these levels are abnormal, treatment to correct the problems will occur. Medicines Your caregiver may use oral medicines to treat SVT. These medicines are given for long-term control of SVT. Medicines may be used alone or in combination with other treatments. These medicines work to slow nerve impulses in the heart muscle. These medicines can also be used to treat high blood pressure. Some of these medicines may include:  Calcium channel blockers.  Beta blockers.  Digoxin. Nonsurgical procedures Nonsurgical techniques may be used if oral medicines do not work. Some examples include:  Cardioversion. This technique uses either drugs or an electrical shock to restore a normal heart rhythm.  Cardioversion drugs may be given through an intravenous (IV) line to help "reset" the heart rhythm.  In electrical cardioversion, the caregiver shocks your heart to stop its beat for a split second. This helps to reset the heart to a normal rhythm.  Ablation. This procedure is done under mild  sedation. High frequency radio wave energy is used to destroy the area of heart tissue responsible for the SVT. HOME CARE INSTRUCTIONS   Do not smoke.  Only take medicines prescribed by your caregiver. Check with your caregiver before using over-the-counter medicines.  Check with your caregiver about how much alcohol and caffeine (coffee, tea, colas, or chocolate) you may have.  It is very important to keep all follow-up referrals and appointments in order to properly manage this problem. SEEK IMMEDIATE MEDICAL CARE IF:  You have dizziness.  You faint  or nearly faint.  You have shortness of breath.  You have chest pain or pressure.  You have sudden nausea or vomiting.  You have profuse sweating.  You are concerned about how long your symptoms last.  You are concerned about the frequency of your SVT episodes. If you have the above symptoms, call your local emergency services (911 in [place] immediately. Do not drive yourself to the hospital. MAKE SURE YOU:   Understand these instructions.  Will watch your condition.  Will get help right away if you are not doing well or get worse. Document Released: 08/28/2005 Document Revised: 11/20/2011 Document Reviewed: 12/10/2008 [facility] Patient Information 2015 [place], [place]. This information is not intended to replace advice given to you by your health care provider. Make sure you discuss any questions you have with your health care provider.

## 2014-05-16 NOTE — ED Notes (Signed)
Lita Mains, MD is aware of the pt's blood pressure. No new orders.

## 2014-05-16 NOTE — ED Provider Notes (Addendum)
CSN: [id number]     Arrival date & time 05/16/14  0045 History   First MD Initiated Contact with Patient 05/16/14 [phone]     Chief Complaint  Patient presents with  . Tachycardia     (Consider location/radiation/quality/duration/timing/severity/associated sxs/prior Treatment) HPI Patient presents with acute onset palpitations starting around midnight this evening. Associated with mild chest pressure and shortness of breath. EMS was called and patient was found to be in SVT. Was given 6 mg adenosine with no effect. She then was given 12 mg of adenosine and converted to normal sinus rhythm. Patient is currently asymptomatic. She specifically denies any chest pain, shortness of breath.   Patient also complains of ongoing abdominal distention and lower  extremity swelling right greater than left. She has a dense left-sided paraplegia with no acute changes. Past Medical History  Diagnosis Date  . Hyperlipidemia   . Diabetes mellitus without complication   . COPD (chronic obstructive pulmonary disease)   . Depression   . Hypertension   . Thyroid disease   . Anemia   . CAD (coronary artery disease)   . Chronic pain   . Hemiplegia affecting non-dominant side, post-stroke   . Fall   . Urine retention   . Stroke     left side paralysis   Past Surgical History  Procedure Laterality Date  . Joint replacement      bil hip  . Abdominal hysterectomy    . Cholecystectomy    . Appendectomy    . Tonsillectomy     No family history on file. History  Substance Use Topics  . Smoking status: Former Smoker    Types: Cigarettes  . Smokeless tobacco: Not on file  . Alcohol Use: No   OB History   Grav Para Term Preterm Abortions TAB SAB Ect Mult Living                 Review of Systems  Constitutional: Negative for fever and chills.  Respiratory: Positive for chest tightness and shortness of breath. Negative for cough.   Cardiovascular: Positive for palpitations and leg swelling. Negative  for chest pain.  Gastrointestinal: Positive for abdominal distention. Negative for nausea, vomiting and abdominal pain.  Genitourinary: Negative for dysuria, frequency and flank pain.  Musculoskeletal: Negative for back pain, neck pain and neck stiffness.  Skin: Negative for rash and wound.  Neurological: Negative for dizziness, weakness and numbness.      Allergies  Niaspan; Other; Valium; and Wellbutrin  Home Medications   Prior to Admission medications   Medication Sig Start Date End Date Taking? Authorizing Provider  Aclidinium Bromide (TUDORZA PRESSAIR IN) Inhale 2 puffs into the lungs daily.   Yes Historical Provider, MD  albuterol (PROVENTIL HFA;VENTOLIN HFA) 108 (90 BASE) MCG/ACT inhaler Inhale 1 puff into the lungs every 6 (six) hours as needed for wheezing or shortness of breath.   Yes Historical Provider, MD  Amino Acids-Protein Hydrolys (FEEDING SUPPLEMENT, PRO-STAT SUGAR FREE 64,) LIQD Take 30 mLs by mouth 2 (two) times daily.   Yes Historical Provider, MD  baclofen (LIORESAL) 10 MG tablet Take 10 mg by mouth 3 (three) times daily.  03/27/14  Yes Gerlene Fee, NP  clonazePAM (KLONOPIN) 0.5 MG tablet Take 0.5 mg by mouth every 6 (six) hours as needed for anxiety.   Yes Historical Provider, MD  diclofenac sodium (VOLTAREN) 1 % GEL Apply 2 g topically at bedtime. To both feet   Yes Historical Provider, MD  docusate sodium (COLACE) 100  MG capsule Take 100 mg by mouth 2 (two) times daily.   Yes Historical Provider, MD  fluticasone (FLONASE) 50 MCG/ACT nasal spray Place 2 sprays into both nostrils daily.   Yes Historical Provider, MD  furosemide (LASIX) 20 MG tablet Take 2 tablets (40 mg total) by mouth daily. 02/16/14  Yes Gerlene Fee, NP  insulin detemir (LEVEMIR) 100 UNIT/ML injection Inject 20-60 Units into the skin 2 (two) times daily. 20 units in the morning and 60 units at bedtime.   Yes Historical Provider, MD  insulin lispro (HUMALOG) 100 UNIT/ML injection Inject 10-16  Units into the skin 3 (three) times daily before meals. 16 units before meals and 10 additional cbg >150 01/24/14  Yes Gerlene Fee, NP  isosorbide mononitrate (IMDUR) 30 MG 24 hr tablet Take 30 mg by mouth daily.   Yes Historical Provider, MD  levothyroxine (SYNTHROID, LEVOTHROID) 25 MCG tablet Take 25 mcg by mouth daily before breakfast.   Yes Historical Provider, MD  Linaclotide (LINZESS) 290 MCG CAPS capsule Take 290 mcg by mouth daily.   Yes Historical Provider, MD  linagliptin (TRADJENTA) 5 MG TABS tablet Take 5 mg by mouth daily.   Yes Historical Provider, MD  MAGNESIUM SULFATE, LAXATIVE, PO Take 300 mg by mouth daily as needed (constipation).   Yes Historical Provider, MD  Melatonin 3 MG TABS Take 6 mg by mouth at bedtime.   Yes Historical Provider, MD  metFORMIN (GLUCOPHAGE) 500 MG tablet Take 500 mg by mouth 2 (two) times daily with a meal.   Yes Historical Provider, MD  mirtazapine (REMERON) 30 MG tablet Take 30 mg by mouth at bedtime.   Yes Historical Provider, MD  omega-3 acid ethyl esters (LOVAZA) 1 G capsule Take 2 g by mouth daily.   Yes Historical Provider, MD  OxyCODONE HCl (ROXICODONE PO) Take 10 mg by mouth 5 (five) times daily.   Yes Historical Provider, MD  rosuvastatin (CRESTOR) 40 MG tablet Take 40 mg by mouth every evening.    Yes Historical Provider, MD  simethicone (MYLICON) 80 MG chewable tablet Chew 160 mg by mouth 3 (three) times daily.  03/27/14  Yes Gerlene Fee, NP  spironolactone (ALDACTONE) 25 MG tablet Take 25 mg by mouth 2 (two) times daily.    Yes Historical Provider, MD   BP 134/51  Pulse 86  Temp(Src) 98.8 F (37.1 C) (Oral)  Resp 15  Ht 5\' 3"  (1.6 m)  Wt 168 lb (76.204 kg)  BMI 29.77 kg/m2  SpO2 98% Physical Exam  Nursing note and vitals reviewed. Constitutional: She is oriented to person, place, and time. She appears well-developed and well-nourished. No distress.  HENT:  Head: Normocephalic and atraumatic.  Mouth/Throat: Oropharynx is clear  and moist. No oropharyngeal exudate.  Eyes: EOM are normal. Pupils are equal, round, and reactive to light.  Neck: Normal range of motion. Neck supple.  Cardiovascular: Normal rate and regular rhythm.  Exam reveals no gallop and no friction rub.   No murmur heard. Pulmonary/Chest: Effort normal and breath sounds normal. No respiratory distress. She has no wheezes. She has no rales.  Abdominal: Soft. Bowel sounds are normal. She exhibits distension. She exhibits no mass. There is no tenderness. There is no rebound and no guarding.  Musculoskeletal: Normal range of motion. She exhibits edema. She exhibits no tenderness.  Right greater than left lower extremity swelling throughout. Distal pulses intact.  Neurological: She is alert and oriented to person, place, and time.  0/5 motor in  left upper and lower extremities. 5/5 motor in right upper and lower extremities  Skin: Skin is warm and dry. No rash noted. No erythema.  Psychiatric: She has a normal mood and affect. Her behavior is normal.    ED Course  Procedures (including critical care time) Labs Review Labs Reviewed  CBC WITH DIFFERENTIAL - Abnormal; Notable for the following:    Hemoglobin 11.2 (*)    RDW 15.6 (*)    All other components within normal limits  COMPREHENSIVE METABOLIC PANEL - Abnormal; Notable for the following:    Chloride 94 (*)    Glucose, Bld 310 (*)    BUN 24 (*)    Albumin 3.1 (*)    Total Bilirubin 0.2 (*)    All other components within normal limits  URINALYSIS, ROUTINE W REFLEX MICROSCOPIC - Abnormal; Notable for the following:    Glucose, UA >1000 (*)    Protein, ur 30 (*)    All other components within normal limits  TROPONIN I  URINE MICROSCOPIC-ADD ON  I-STAT TROPOININ, ED    Imaging Review Dg Abd Acute W/chest  05/16/2014   CLINICAL DATA:  Chest pain, tachycardia.  EXAM: ACUTE ABDOMEN SERIES (ABDOMEN 2 VIEW & CHEST 1 VIEW)  COMPARISON:  None.  FINDINGS: Cardiac silhouette is normal in size,  Coronary artery stent. Moderately calcified aortic knob, mediastinal silhouette is unremarkable no pleural effusions or focal consolidations. Low inspiratory examination. No pneumothorax.  ACDF.  Remote probable left posterior rib fractures.  Surgical clips in the included right abdomen likely reflect cholecystectomy. A few nonspecific air-fluid levels in the small bowel. Gas-filled nondistended small large bowel. Moderate amount of retained large bowel stool. No intra-abdominal mass effect. No free air.  Bilateral hip total arthroplasties, incompletely imaged though, the left acetabular screw projects medial to the cortex. L5-S1 PLIF.  IMPRESSION: No acute cardiopulmonary process.  Moderate amount of retained large bowel stool, nonspecific bowel gas pattern.   Electronically Signed   By: Elon Alas   On: 05/16/2014 02:39     EKG Interpretation None      Date: 05/16/2014  Rate: 104  Rhythm: normal sinus rhythm  QRS Axis: normal  Intervals: normal  ST/T Wave abnormalities: normal  Conduction Disutrbances:none  Narrative Interpretation:   Old EKG Reviewed: unchanged   MDM   Final diagnoses:  Paroxysmal SVT (supraventricular tachycardia)  Constipation, unspecified constipation type  Swelling of left lower extremity      Patient remains hemodynamically stable emergency department. With chest pressure. Troponin x2 is normal. Patient advised to followup with her cardiologist and primary care Dr. Burnis Medin schedule patient for outpatient ultrasound of the right lower extremity. She's had a recent Doppler study of her left lower extremity without evidence of DVT. Return precautions given.  Julianne Rice, MD 05/16/14 [phone]   The patient is anxious to leave she is requested to stay and wait to have the Doppler performed in the morning. Will move to Pod C  to await Doppler study. If negative can be discharged home. Signed out to oncoming emergency physician.  Julianne Rice,  MD 05/18/14 [phone]

## 2014-05-18 ENCOUNTER — Ambulatory Visit (HOSPITAL_COMMUNITY)
Admission: RE | Admit: 2014-05-18 | Discharge: 2014-05-18 | Disposition: A | Discharge status: Home / Self Care | Payer: Medicare Other | Source: Ambulatory Visit | Attending: Emergency Medicine | Admitting: Emergency Medicine

## 2014-05-19 ENCOUNTER — Ambulatory Visit (HOSPITAL_COMMUNITY): Payer: Medicare Other

## 2014-05-20 DIAGNOSIS — M79609 Pain in unspecified limb: Secondary | ICD-10-CM | POA: Diagnosis not present

## 2014-05-20 DIAGNOSIS — B351 Tinea unguium: Secondary | ICD-10-CM | POA: Diagnosis not present

## 2014-05-21 ENCOUNTER — Ambulatory Visit (HOSPITAL_COMMUNITY): Payer: Medicare Other

## 2014-05-21 DIAGNOSIS — F329 Major depressive disorder, single episode, unspecified: Secondary | ICD-10-CM | POA: Diagnosis not present

## 2014-05-21 DIAGNOSIS — F411 Generalized anxiety disorder: Secondary | ICD-10-CM | POA: Diagnosis not present

## 2014-05-21 DIAGNOSIS — F3289 Other specified depressive episodes: Secondary | ICD-10-CM | POA: Diagnosis not present

## 2014-05-21 DIAGNOSIS — G4700 Insomnia, unspecified: Secondary | ICD-10-CM | POA: Diagnosis not present

## 2014-05-22 DIAGNOSIS — E119 Type 2 diabetes mellitus without complications: Secondary | ICD-10-CM | POA: Diagnosis not present

## 2014-05-25 ENCOUNTER — Encounter: Payer: Self-pay | Admitting: Internal Medicine

## 2014-05-25 ENCOUNTER — Non-Acute Institutional Stay (SKILLED_NURSING_FACILITY): Payer: Medicare Other | Admitting: Internal Medicine

## 2014-05-25 DIAGNOSIS — K5900 Constipation, unspecified: Secondary | ICD-10-CM | POA: Diagnosis not present

## 2014-05-25 DIAGNOSIS — E785 Hyperlipidemia, unspecified: Secondary | ICD-10-CM | POA: Diagnosis not present

## 2014-05-25 DIAGNOSIS — E039 Hypothyroidism, unspecified: Secondary | ICD-10-CM

## 2014-05-25 DIAGNOSIS — M62838 Other muscle spasm: Secondary | ICD-10-CM

## 2014-05-25 DIAGNOSIS — M792 Neuralgia and neuritis, unspecified: Secondary | ICD-10-CM

## 2014-05-25 DIAGNOSIS — IMO0002 Reserved for concepts with insufficient information to code with codable children: Secondary | ICD-10-CM

## 2014-05-25 NOTE — Progress Notes (Signed)
Patient ID: Susan [name], female   DOB: [birth year], 63 y.o.   MRN: [id number]    Facility: [facility]   Code status: DNR  Chief Complaint  Patient presents with  . Medical Management of Chronic Issues   Allergies  Allergen Reactions  . Niaspan [Niacin Er] Other (See Comments)    unknown  . Other     onions  . Valium [Diazepam] Other (See Comments)    unknown  . Wellbutrin [Bupropion] Other (See Comments)    unknown   HPI 63 y/o female patient seen for routine visit. She complaints of increased muscle tightness and anxiety. She would like her klonopin dosing adjusted. Mood stable, on remeron 45 mg daily. No other concerns. No new concern from staff. Patient was in the ED on 05/16/14 with SVT converted to sinus with adenosine and she also had doppler ultrasound which ruled out DVT. She is seen in her room and is in no distress.  Review of Systems  Constitutional: Negative for malaise/fatigue.  Respiratory: Negative for cough and shortness of breath.   Cardiovascular: Negative for chest pain and palpitations. Has leg swelling Gastrointestinal: negative for constipation. Negative for heartburn.  Musculoskeletal: has chronic pain Skin: Negative.   Psychiatric/Behavioral: Negative for depression. The patient is not nervous/anxious.    Past Medical History  Diagnosis Date  . Hyperlipidemia   . Diabetes mellitus without complication   . COPD (chronic obstructive pulmonary disease)   . Depression   . Hypertension   . Thyroid disease   . Anemia   . CAD (coronary artery disease)   . Chronic pain   . Hemiplegia affecting non-dominant side, post-stroke   . Fall   . Urine retention   . Stroke     left side paralysis   Current Outpatient Prescriptions on File Prior to Visit  Medication Sig Dispense Refill  . Aclidinium Bromide (TUDORZA PRESSAIR IN) Inhale 2 puffs into the lungs daily.      Marland Kitchen albuterol (PROVENTIL HFA;VENTOLIN HFA) 108 (90 BASE) MCG/ACT  inhaler Inhale 1 puff into the lungs every 6 (six) hours as needed for wheezing or shortness of breath.      . Amino Acids-Protein Hydrolys (FEEDING SUPPLEMENT, PRO-STAT SUGAR FREE 64,) LIQD Take 30 mLs by mouth 2 (two) times daily.      . baclofen (LIORESAL) 10 MG tablet Take 10 mg by mouth 3 (three) times daily.       . clonazePAM (KLONOPIN) 0.5 MG tablet Take 0.5 mg by mouth every 6 (six) hours as needed for anxiety.      . diclofenac sodium (VOLTAREN) 1 % GEL Apply 2 g topically at bedtime. To both feet      . docusate sodium (COLACE) 100 MG capsule Take 100 mg by mouth 2 (two) times daily.      . fluticasone (FLONASE) 50 MCG/ACT nasal spray Place 2 sprays into both nostrils daily.      . furosemide (LASIX) 20 MG tablet Take 2 tablets (40 mg total) by mouth daily.  30 tablet  3  . insulin detemir (LEVEMIR) 100 UNIT/ML injection Inject 20-60 Units into the skin 2 (two) times daily. 20 units in the morning and 60 units at bedtime.      . insulin lispro (HUMALOG) 100 UNIT/ML injection Inject 10-16 Units into the skin 3 (three) times daily before meals. 16 units before meals and 10 additional cbg >150      . isosorbide mononitrate (IMDUR) 30 MG 24 hr tablet  Take 30 mg by mouth daily.      Marland Kitchen levothyroxine (SYNTHROID, LEVOTHROID) 25 MCG tablet Take 25 mcg by mouth daily before breakfast.      . Linaclotide (LINZESS) 290 MCG CAPS capsule Take 290 mcg by mouth daily.      Marland Kitchen linagliptin (TRADJENTA) 5 MG TABS tablet Take 5 mg by mouth daily.      Marland Kitchen MAGNESIUM SULFATE, LAXATIVE, PO Take 300 mg by mouth daily as needed (constipation).      . Melatonin 3 MG TABS Take 6 mg by mouth at bedtime.      . metFORMIN (GLUCOPHAGE) 500 MG tablet Take 500 mg by mouth 2 (two) times daily with a meal.      . mirtazapine (REMERON) 30 MG tablet Take 30 mg by mouth at bedtime.      Marland Kitchen omega-3 acid ethyl esters (LOVAZA) 1 G capsule Take 2 g by mouth daily.      . OxyCODONE HCl (ROXICODONE PO) Take 10 mg by mouth 5 (five)  times daily.      . rosuvastatin (CRESTOR) 40 MG tablet Take 40 mg by mouth every evening.       . simethicone (MYLICON) 80 MG chewable tablet Chew 160 mg by mouth 3 (three) times daily.       Marland Kitchen spironolactone (ALDACTONE) 25 MG tablet Take 25 mg by mouth 2 (two) times daily.        No current facility-administered medications on file prior to visit.   Physical exam BP 127/60  Pulse 85  Temp(Src) 97.5 F (36.4 C)  Resp 18  Constitutional: She is oriented to person, place, and time. No distress. obese Neck: Neck supple. No JVD present.  Cardiovascular: Normal rate, regular rhythm and intact distal pulses.   Respiratory: Effort normal and breath sounds normal. No respiratory distress. She has no wheezes.  GI: Bowel sounds are normal. There is no tenderness.  Musculoskeletal: She exhibits edema in both legs. Left sided weakness from prior CVA with some mild left facial droop present Neurological: She is alert and oriented to person, place, and time.  Skin: Skin is warm and dry. She is not diaphoretic.   LABS REVIEWED:    01-05-14: urine culture: no growth 01-06-14: wbc 9.4; hgb 10.8; hct 35.5; mcv 98.3 plt 319; glucose 313; bun 33; creat 0.5; k+4.2; na++137 liver normal albumin 3.0 chol 166; ldl 104; trig 156; tsh 4.429  hgb a1c 9.3 02-12-14: wbc 7.6; hgb 10.4; hct 32.2; mcv 93; plt 288; glucose 242; bun 17; creat 0.72; k+4.1; na++137 Urine culture: no growth, lupus panel mostly negative except ANA pos 02-14-14: wbc 10.9; hgb 7.7; hct 24.4; mcv 94.2; plt 347; glucose 144; bun 13; creat 0.47; k+3.9; na++136; liver normal albumin 2.7; BNP 1090 02-17-14: BNP 24   03/03/14:glcuose 277; bun 22; creat 0.8; k+4.2; na++134    03/06/14: UA: 50,000 of Proteus Mirabilis 03/10/14:wbc 7.8; hgb 10.5; hct  mcv 95.1; plt 308; glucose 308; bun 27; creat 0.6; na++ 132 Liver normal albumin 3.1 amylase 12; lipase 7   03-19-14: glcuose 348; bun 27; creat 0.7; k+4.4; na++133   04-07-14: hgb a1c 10.5 04-17-14: wbc 8.1;  hgb 11.6; hct 38.3; mcv 88; plt 269    assessment/plan  Muscle spasticity With her old cva and left sided hemiparesis. On baclofen 10 mg tid. Has spasticity present. Will have her on flexeril 5 mg bid and reassess. Valium is another option as this can help with muscle spasm and anxiety both and we  can remove klonopin, this can be tried if flexeril is not helpful   Anxiety Will have her on zoloft 50 mg daily and continue prn klonopin  Constipation Stable on linzess with colace and magnesium sulfate  Neuropathic pain Continue voltaren gel  Hypothyroidism Continue levothyroxine current regimen  Hyperlipidemia Continue her current dose of crestor and lovaza

## 2014-06-01 DIAGNOSIS — M169 Osteoarthritis of hip, unspecified: Secondary | ICD-10-CM | POA: Diagnosis not present

## 2014-06-01 DIAGNOSIS — Z79899 Other long term (current) drug therapy: Secondary | ICD-10-CM | POA: Diagnosis not present

## 2014-06-01 DIAGNOSIS — G609 Hereditary and idiopathic neuropathy, unspecified: Secondary | ICD-10-CM | POA: Diagnosis not present

## 2014-06-01 DIAGNOSIS — G894 Chronic pain syndrome: Secondary | ICD-10-CM | POA: Diagnosis not present

## 2014-06-01 DIAGNOSIS — M1610 Unilateral primary osteoarthritis, unspecified hip: Secondary | ICD-10-CM | POA: Diagnosis not present

## 2014-06-01 DIAGNOSIS — M5137 Other intervertebral disc degeneration, lumbosacral region: Secondary | ICD-10-CM | POA: Diagnosis not present

## 2014-06-03 DIAGNOSIS — F411 Generalized anxiety disorder: Secondary | ICD-10-CM | POA: Diagnosis not present

## 2014-06-03 DIAGNOSIS — G4700 Insomnia, unspecified: Secondary | ICD-10-CM | POA: Diagnosis not present

## 2014-06-03 DIAGNOSIS — F329 Major depressive disorder, single episode, unspecified: Secondary | ICD-10-CM | POA: Diagnosis not present

## 2014-06-03 DIAGNOSIS — F3289 Other specified depressive episodes: Secondary | ICD-10-CM | POA: Diagnosis not present

## 2014-06-15 ENCOUNTER — Non-Acute Institutional Stay (SKILLED_NURSING_FACILITY): Payer: Medicare Other | Admitting: Adult Health

## 2014-06-15 DIAGNOSIS — I639 Cerebral infarction, unspecified: Secondary | ICD-10-CM

## 2014-06-15 DIAGNOSIS — I10 Essential (primary) hypertension: Secondary | ICD-10-CM

## 2014-06-15 DIAGNOSIS — E1165 Type 2 diabetes mellitus with hyperglycemia: Secondary | ICD-10-CM

## 2014-06-15 DIAGNOSIS — IMO0002 Reserved for concepts with insufficient information to code with codable children: Secondary | ICD-10-CM

## 2014-06-15 DIAGNOSIS — K5909 Other constipation: Secondary | ICD-10-CM

## 2014-06-15 DIAGNOSIS — E038 Other specified hypothyroidism: Secondary | ICD-10-CM

## 2014-06-15 DIAGNOSIS — J449 Chronic obstructive pulmonary disease, unspecified: Secondary | ICD-10-CM | POA: Diagnosis not present

## 2014-06-15 DIAGNOSIS — G4700 Insomnia, unspecified: Secondary | ICD-10-CM

## 2014-06-15 DIAGNOSIS — I2510 Atherosclerotic heart disease of native coronary artery without angina pectoris: Secondary | ICD-10-CM | POA: Diagnosis not present

## 2014-06-15 DIAGNOSIS — I69354 Hemiplegia and hemiparesis following cerebral infarction affecting left non-dominant side: Secondary | ICD-10-CM

## 2014-06-15 DIAGNOSIS — E1141 Type 2 diabetes mellitus with diabetic mononeuropathy: Secondary | ICD-10-CM

## 2014-06-15 DIAGNOSIS — E1149 Type 2 diabetes mellitus with other diabetic neurological complication: Secondary | ICD-10-CM

## 2014-06-15 DIAGNOSIS — I69854 Hemiplegia and hemiparesis following other cerebrovascular disease affecting left non-dominant side: Secondary | ICD-10-CM

## 2014-06-15 DIAGNOSIS — M792 Neuralgia and neuritis, unspecified: Secondary | ICD-10-CM

## 2014-06-15 DIAGNOSIS — E785 Hyperlipidemia, unspecified: Secondary | ICD-10-CM

## 2014-06-15 DIAGNOSIS — R609 Edema, unspecified: Secondary | ICD-10-CM

## 2014-06-15 DIAGNOSIS — F419 Anxiety disorder, unspecified: Secondary | ICD-10-CM

## 2014-06-16 DIAGNOSIS — E0822 Diabetes mellitus due to underlying condition with diabetic chronic kidney disease: Secondary | ICD-10-CM | POA: Diagnosis not present

## 2014-06-24 ENCOUNTER — Other Ambulatory Visit: Payer: Self-pay | Admitting: *Deleted

## 2014-06-24 MED ORDER — CLONAZEPAM 0.5 MG PO TABS: ORAL_TABLET | ORAL | Status: DC

## 2014-06-24 NOTE — Telephone Encounter (Signed)
[facility] 

## 2014-06-26 ENCOUNTER — Telehealth: Payer: Self-pay | Admitting: Internal Medicine

## 2014-06-26 ENCOUNTER — Ambulatory Visit: Payer: Medicare Other | Admitting: Internal Medicine

## 2014-06-26 NOTE — Telephone Encounter (Signed)
No charge. 

## 2014-06-29 DIAGNOSIS — M5412 Radiculopathy, cervical region: Secondary | ICD-10-CM | POA: Diagnosis not present

## 2014-06-29 DIAGNOSIS — G894 Chronic pain syndrome: Secondary | ICD-10-CM | POA: Diagnosis not present

## 2014-06-29 DIAGNOSIS — M5030 Other cervical disc degeneration, unspecified cervical region: Secondary | ICD-10-CM | POA: Diagnosis not present

## 2014-06-29 DIAGNOSIS — G90519 Complex regional pain syndrome I of unspecified upper limb: Secondary | ICD-10-CM | POA: Diagnosis not present

## 2014-06-29 DIAGNOSIS — M12819 Other specific arthropathies, not elsewhere classified, unspecified shoulder: Secondary | ICD-10-CM | POA: Diagnosis not present

## 2014-06-29 DIAGNOSIS — M47812 Spondylosis without myelopathy or radiculopathy, cervical region: Secondary | ICD-10-CM | POA: Diagnosis not present

## 2014-06-29 DIAGNOSIS — M5382 Other specified dorsopathies, cervical region: Secondary | ICD-10-CM | POA: Diagnosis not present

## 2014-06-29 DIAGNOSIS — Z79899 Other long term (current) drug therapy: Secondary | ICD-10-CM | POA: Diagnosis not present

## 2014-06-29 DIAGNOSIS — M25519 Pain in unspecified shoulder: Secondary | ICD-10-CM | POA: Diagnosis not present

## 2014-07-01 ENCOUNTER — Encounter: Payer: Self-pay | Admitting: Adult Health

## 2014-07-01 MED ORDER — DOCUSATE SODIUM 100 MG PO CAPS: 200.0000 mg | ORAL_CAPSULE | Freq: Two times a day (BID) | ORAL | Status: AC

## 2014-07-01 NOTE — Progress Notes (Signed)
Patient ID: Susan [name], female   DOB: 1951-01-28, 63 y.o.   MRN: [id number]     [facility]  Allergies  Allergen Reactions  . Niaspan [Niacin Er] Other (See Comments)    unknown  . Other     onions  . Valium [Diazepam] Other (See Comments)    unknown  . Wellbutrin [Bupropion] Other (See Comments)    unknown     Chief Complaint  Patient presents with  . Medical Management of Chronic Issues    HPI:  She is a long term resident of this facility being seen for the management of her chronic illnesses. There is no change in her overall status. She is being seen by the pain clinic for her chronic pain management issues. She is satisfied with the results at this time. There are no nursing concerns being voiced at this time. She is complaining of constipation.   Past Medical History  Diagnosis Date  . Hyperlipidemia   . Diabetes mellitus without complication   . COPD (chronic obstructive pulmonary disease)   . Depression   . Hypertension   . Thyroid disease   . Anemia   . CAD (coronary artery disease)   . Chronic pain   . Hemiplegia affecting non-dominant side, post-stroke   . Fall   . Urine retention   . Stroke     left side paralysis    Past Surgical History  Procedure Laterality Date  . Joint replacement      bil hip  . Abdominal hysterectomy    . Cholecystectomy    . Appendectomy    . Tonsillectomy      VITAL SIGNS BP 120/74  Pulse 98  Ht 5\' 3"  (1.6 m)  Wt 177 lb 11.2 oz (80.604 kg)  BMI 31.49 kg/m2   Patient's Medications  New Prescriptions   No medications on file  Previous Medications   ACLIDINIUM BROMIDE (TUDORZA PRESSAIR IN)    Inhale 2 puffs into the lungs daily.   ALBUTEROL (PROVENTIL HFA;VENTOLIN HFA) 108 (90 BASE) MCG/ACT INHALER    Inhale 1 puff into the lungs every 6 (six) hours as needed for wheezing or shortness of breath.   AMINO ACIDS-PROTEIN HYDROLYS (FEEDING SUPPLEMENT, PRO-STAT SUGAR FREE 64,) LIQD    Take 30 mLs by mouth 2 (two)  times daily.   BACLOFEN (LIORESAL) 10 MG TABLET    Take 10 mg by mouth 3 (three) times daily.    CLONAZEPAM (KLONOPIN) 0.5 MG TABLET    Take one tablet by mouth every 6 hours as needed for anxiety   CYCLOBENZAPRINE (FLEXERIL) 5 MG TABLET    Take 5 mg by mouth 2 (two) times daily.   DICLOFENAC SODIUM (VOLTAREN) 1 % GEL    Apply 2 g topically at bedtime. To both feet   DOCUSATE SODIUM (COLACE) 100 MG CAPSULE    Take 100 mg by mouth 2 (two) times daily.   FLUTICASONE (FLONASE) 50 MCG/ACT NASAL SPRAY    Place 2 sprays into both nostrils daily.   FUROSEMIDE (LASIX) 20 MG TABLET    Take 2 tablets (40 mg total) by mouth daily.   INSULIN DETEMIR (LEVEMIR) 100 UNIT/ML INJECTION    Inject 20-60 Units into the skin 2 (two) times daily. 20 units in the morning and 60 units at bedtime.   INSULIN LISPRO (HUMALOG) 100 UNIT/ML INJECTION    Inject 10-16 Units into the skin 3 (three) times daily before meals. 16 units before meals and 10 additional cbg >150  ISOSORBIDE MONONITRATE (IMDUR) 30 MG 24 HR TABLET    Take 30 mg by mouth daily.   LEVOTHYROXINE (SYNTHROID, LEVOTHROID) 25 MCG TABLET    Take 25 mcg by mouth daily before breakfast.   LINACLOTIDE (LINZESS) 290 MCG CAPS CAPSULE    Take 290 mcg by mouth daily.   LINAGLIPTIN (TRADJENTA) 5 MG TABS TABLET    Take 5 mg by mouth daily.   MELATONIN 3 MG TABS    Take 6 mg by mouth at bedtime.   METFORMIN (GLUCOPHAGE) 500 MG TABLET    Take 500 mg by mouth 2 (two) times daily with a meal.   MIRTAZAPINE (REMERON) 30 MG TABLET    Take 30 mg by mouth at bedtime.   OMEGA-3 ACID ETHYL ESTERS (LOVAZA) 1 G CAPSULE    Take 2 g by mouth daily.   OXYCODONE HCL (ROXICODONE PO)    Take 15 mg by mouth 5 (five) times daily. And daily as needed   PREGABALIN (LYRICA) 100 MG CAPSULE    Take 100 mg by mouth at bedtime.   ROSUVASTATIN (CRESTOR) 40 MG TABLET    Take 40 mg by mouth every evening.    SERTRALINE (ZOLOFT) 50 MG TABLET    Take 50 mg by mouth daily.   SIMETHICONE (MYLICON) 80  MG CHEWABLE TABLET    Chew 160 mg by mouth 3 (three) times daily.    SPIRONOLACTONE (ALDACTONE) 25 MG TABLET    Take 25 mg by mouth 2 (two) times daily.   Modified Medications   No medications on file  Discontinued Medications   MAGNESIUM SULFATE, LAXATIVE, PO    Take 300 mg by mouth daily as needed (constipation).    SIGNIFICANT DIAGNOSTIC EXAMS   02-12-14: left upper extremity doppler: negative for dvt  02-12-14: chest x-ray: no acute disease; negative for chf  02-12-14: kub: 1. Colonic constipation or feces stasis 2. Nonspecific notable gaseous gastric distention chronicity underlying etiology known. 3. No other acute abnormality  02-14-14: left hip x-ray: hairline break in the cortex at the base of the lesser trochanter   02-14-14: left hip x-ray at ED: 1. No acute fracture or malalignment. 2. Heterotopic ossification the extends from the left anterior inferior iliac spine toward the greater trochanter. 3. Cortical buttressing and mild periosteal reaction along the lateral aspect of the left femoral diaphysis adjacent to the distal tip of the femoral stem suggests stress reaction.    03-12-14: left hip xray: s/p left total hip arthroplasty, no acute osseous abnormality  03-12-14: abd xray: correlate clinically for significant colonic constipation or fecal stasis, no other acute abd abnormality evident   LABS REVIEWED:   01-05-14: urine culture: no growth 01-06-14: wbc 9.4; hgb 10.8; hct 35.5; mcv 98.3 plt 319; glucose 313; bun 33; creat 0.5; k+4.2; na++137 liver normal albumin 3.0 chol 166; ldl 104; trig 156; tsh 4.429  hgb a1c 9.3 02-12-14: wbc 7.6; hgb 10.4; hct 32.2; mcv 93; plt 288; glucose 242; bun 17; creat 0.72; k+4.1; na++137 Urine culture: no growth, lupus panel mostly negative except ANA pos 02-14-14: wbc 10.9; hgb 7.7; hct 24.4; mcv 94.2; plt 347; glucose 144; bun 13; creat 0.47; k+3.9; na++136; liver normal albumin 2.7; BNP 1090 02-17-14: BNP 24  03/03/14:glcuose 277; bun 22; creat 0.8;  k+4.2; na++134   03/06/14: UA: 50,000 of Proteus Mirabilis 03/10/14:wbc 7.8; hgb 10.5; hct  mcv 95.1; plt 308; glucose 308; bun 27; creat 0.6; na++ 132 Liver normal albumin 3.1 amylase 12; lipase 7  03-19-14: glcuose  348; bun 27; creat 0.7; k+4.4; na++133  04-07-14: hgb a1c 10.5 04-17-14: wbc 8.1; hgb 11.6; hct 38.3; mcv 88; plt 269      Review of Systems  Constitutional: Negative for malaise/fatigue.  Respiratory: Negative for cough and shortness of breath.  has sinus congestion Cardiovascular: Positive for leg swelling. Negative for chest pain and palpitations.  Gastrointestinal: negative for constipation. Negative for heartburn.  Musculoskeletal: Positive for joint pain and myalgias.  Skin: Negative.   Psychiatric/Behavioral: Negative for depression. The patient is not nervous/anxious.      Physical Exam  Constitutional: She is oriented to person, place, and time. No distress.  obese  Neck: Neck supple. No JVD present.  Cardiovascular: Normal rate, regular rhythm and intact distal pulses.   Respiratory: Effort normal and breath sounds normal. No respiratory distress. She has no wheezes.  GI: Bowel sounds are normal. She exhibits distension. There is no tenderness.  Abdomen is firm and distended; is tympanic to percussion this is improving.    Musculoskeletal: She exhibits edema.  Has continued increased muscle tone to bilateral lower extremities.   Neurological: She is alert and oriented to person, place, and time.  Skin: Skin is warm and dry. She is not diaphoretic.      ASSESSMENT/ PLAN:  1. Diabetes: her status remains unchanged; will continue  levemir 60 units nightly and 20 units in the am; will continue tradjenta 5 mg daily; will continue humalog to 16 units with meals with an additional 10  units for cbg >=150.  and will monitor   2. Chronic pain: she is followed by the pain clinic will continue her oxycodone 15 mg 5 times daily and daily as needed; voltaren gel to her  feet nightly; flexeril 5 mg twice daily and baclofen 10 mg three times daily for spasticity; and lyrica 100 mg nightly   3. Copd: is stable will continue tudorza 2 puffs daily and albuterol 1 puff every 6 hours as needed and will monitor her status   4. CAD; no complaints of chest pain present; will continue imdur 30 mg daily; and will ,monitor  5. Hypertension: is stable will continue imdur 30 mg  daily and will monitor  6. Dyslipidemia: will continue crestor 40 mg daily and lovaza 2 gm daily her ldl is 104 and trig 154   7. Hypothyroidism: will continue synthroid 25 mcg daily  8. Anxiety; will continue  zoloft 50 mg daily and klonopin 0.5 mg every 6 hours as needed remeron 45 mg nightly and will monitor   9. Insomnia: will continue melatonin 6 mg nighlty   10. Smoker: not on medications will monitor   11. Constipation: the linzess was stopped; will continue linzess 290 mcg daily daily ; will continue gas x 160 mg three times daily for bloating will increase colace to 200 mg twice daily   12. Hemiparesis as late effect cva; will continue her baclofen 10 mg four times daily for her spasticity and will continue her flexeril 5 mg twice daily is being followed by the pain clinic for her pain management issues.  to monitor her status.   13. Edema: is presently stable will continue lasix 40 mg daily and aldactone 25 mg twice daily and will monitor   14. Allergic rhinitis continue  begin flonase daily    Will check hgb a1c    Time spent with patient 60 minutes.

## 2014-07-08 DIAGNOSIS — E119 Type 2 diabetes mellitus without complications: Secondary | ICD-10-CM | POA: Diagnosis not present

## 2014-07-08 LAB — HEMOGLOBIN A1C: Hgb A1c MFr Bld: 8.4 % — AB (ref 4.0–6.0)

## 2014-07-11 DIAGNOSIS — Z23 Encounter for immunization: Secondary | ICD-10-CM | POA: Diagnosis not present

## 2014-07-14 ENCOUNTER — Encounter: Payer: Self-pay | Admitting: Adult Health Nurse Practitioner

## 2014-07-14 ENCOUNTER — Non-Acute Institutional Stay (SKILLED_NURSING_FACILITY): Payer: Medicare Other | Admitting: Adult Health

## 2014-07-14 ENCOUNTER — Encounter: Payer: Self-pay | Admitting: Adult Health

## 2014-07-14 DIAGNOSIS — I69854 Hemiplegia and hemiparesis following other cerebrovascular disease affecting left non-dominant side: Secondary | ICD-10-CM

## 2014-07-14 DIAGNOSIS — I10 Essential (primary) hypertension: Secondary | ICD-10-CM

## 2014-07-14 DIAGNOSIS — I69354 Hemiplegia and hemiparesis following cerebral infarction affecting left non-dominant side: Secondary | ICD-10-CM

## 2014-07-14 DIAGNOSIS — R609 Edema, unspecified: Secondary | ICD-10-CM

## 2014-07-14 DIAGNOSIS — J449 Chronic obstructive pulmonary disease, unspecified: Secondary | ICD-10-CM

## 2014-07-14 DIAGNOSIS — I639 Cerebral infarction, unspecified: Secondary | ICD-10-CM | POA: Diagnosis not present

## 2014-07-14 DIAGNOSIS — E785 Hyperlipidemia, unspecified: Secondary | ICD-10-CM

## 2014-07-14 DIAGNOSIS — M792 Neuralgia and neuritis, unspecified: Secondary | ICD-10-CM

## 2014-07-14 NOTE — Progress Notes (Signed)
Susan [name]   Code Status: DNR  Allergies  Allergen Reactions  . Niaspan [Niacin Er] Other (See Comments)    unknown  . Other     onions  . Valium [Diazepam] Other (See Comments)    unknown  . Wellbutrin [Bupropion] Other (See Comments)    unknown    Chief Complaint: Medical Management of Chronic Issues  HPI:   Susan [name] is a 63 yr old female being seen today for a routine visit. She has an extensive medical history. Overall her status remains the same with no acute concerns voiced by patient or nursing staff. She is more anxious/depressed today due to the decline in her husbands health. She reports he was hospitalized recently and only given a few weeks to live. She has requested that I change her Klonopin dosage, however this might need to addressed with the pain management clinic she is following. Otherwise she has no new requests. She denies chest pain, shortness of breath, or difficulty breathing.    Review of Systems:  Constitutional: Negative for fever, chills, weight loss, malaise/fatigue and diaphoresis.  Respiratory: Negative for cough, sputum production, shortness of breath and wheezing.   Cardiovascular: Negative for chest pain, palpitations, orthopnea and leg swelling.  Gastrointestinal: Negative for heartburn, nausea, vomiting, abdominal pain, diarrhea and constipation.  Genitourinary: Negative for dysuria, urgency, frequency, hematuria and flank pain.  Musculoskeletal: Positive chronic pain. Positive left paraylsis Skin: Negative for itching and rash.  Neurological: Negative for weakness,dizziness, tingling, focal weakness and headaches.  Psychiatric/Behavioral: The patient is not nervous/anxious.     Past Medical History  Diagnosis Date  . Hyperlipidemia   . Diabetes mellitus without complication   . COPD (chronic obstructive pulmonary disease)   . Depression   . Hypertension   . Thyroid disease   . Anemia   . CAD (coronary artery disease)   .  Chronic pain   . Hemiplegia affecting non-dominant side, post-stroke   . Fall   . Urine retention   . Stroke     left side paralysis   Past Surgical History  Procedure Laterality Date  . Joint replacement      bil hip  . Abdominal hysterectomy    . Cholecystectomy    . Appendectomy    . Tonsillectomy     Social History:   reports that she has quit smoking. Her smoking use included Cigarettes. She smoked 0.00 packs per day. She does not have any smokeless tobacco history on file. She reports that she does not drink alcohol or use illicit drugs.  History reviewed. No pertinent family history.  Medications: Patient's Medications  New Prescriptions   No medications on file  Previous Medications   ACLIDINIUM BROMIDE (TUDORZA PRESSAIR IN)    Inhale 2 puffs into the lungs daily.   ALBUTEROL (PROVENTIL HFA;VENTOLIN HFA) 108 (90 BASE) MCG/ACT INHALER    Inhale 1 puff into the lungs every 6 (six) hours as needed for wheezing or shortness of breath.   AMINO ACIDS-PROTEIN HYDROLYS (FEEDING SUPPLEMENT, PRO-STAT SUGAR FREE 64,) LIQD    Take 30 mLs by mouth 2 (two) times daily.   BACLOFEN (LIORESAL) 10 MG TABLET    Take 10 mg by mouth 3 (three) times daily.    CLONAZEPAM (KLONOPIN) 0.5 MG TABLET    Take one tablet by mouth every 6 hours as needed for anxiety   CYCLOBENZAPRINE (FLEXERIL) 5 MG TABLET    Take 5 mg by mouth 2 (two) times daily.   DICLOFENAC  SODIUM (VOLTAREN) 1 % GEL    Apply 2 g topically at bedtime. To both feet   DOCUSATE SODIUM (COLACE) 100 MG CAPSULE    Take 2 capsules (200 mg total) by mouth 2 (two) times daily.   FLUTICASONE (FLONASE) 50 MCG/ACT NASAL SPRAY    [name] 2 sprays into both nostrils daily.   FUROSEMIDE (LASIX) 20 MG TABLET    Take 2 tablets (40 mg total) by mouth daily.   INSULIN DETEMIR (LEVEMIR) 100 UNIT/ML INJECTION    Inject 20-60 Units into the skin 2 (two) times daily. 20 units in the morning and 60 units at bedtime.   INSULIN LISPRO (HUMALOG) 100 UNIT/ML  INJECTION    Inject 10-16 Units into the skin 3 (three) times daily before meals. 16 units before meals and 10 additional cbg >150   ISOSORBIDE MONONITRATE (IMDUR) 30 MG 24 HR TABLET    Take 30 mg by mouth daily.   LEVOTHYROXINE (SYNTHROID, LEVOTHROID) 25 MCG TABLET    Take 25 mcg by mouth daily before breakfast.   LINACLOTIDE (LINZESS) 290 MCG CAPS CAPSULE    Take 290 mcg by mouth daily.   LINAGLIPTIN (TRADJENTA) 5 MG TABS TABLET    Take 5 mg by mouth daily.   MELATONIN 3 MG TABS    Take 6 mg by mouth at bedtime.   METFORMIN (GLUCOPHAGE) 500 MG TABLET    Take 500 mg by mouth 2 (two) times daily with a meal.   MIRTAZAPINE (REMERON) 30 MG TABLET    Take 30 mg by mouth at bedtime.   OMEGA-3 ACID ETHYL ESTERS (LOVAZA) 1 G CAPSULE    Take 2 g by mouth daily.   OXYCODONE HCL (ROXICODONE PO)    Take 15 mg by mouth 5 (five) times daily. And daily as needed   PREGABALIN (LYRICA) 100 MG CAPSULE    Take 100 mg by mouth at bedtime.   ROSUVASTATIN (CRESTOR) 40 MG TABLET    Take 40 mg by mouth every evening.    SERTRALINE (ZOLOFT) 50 MG TABLET    Take 50 mg by mouth daily.   SIMETHICONE (MYLICON) 80 MG CHEWABLE TABLET    Chew 160 mg by mouth 3 (three) times daily.    SPIRONOLACTONE (ALDACTONE) 25 MG TABLET    Take 25 mg by mouth 2 (two) times daily.   Modified Medications   No medications on file  Discontinued Medications   No medications on file     Physical Exam:  Filed Vitals:   07/14/14 0912  BP: 116/52  Pulse: 94  Temp: 97 F (36.1 C)  Resp: 20  Weight: 178 lb 6.4 oz (80.922 kg)  SpO2: 95%    General- elderly female in no acute distress Neck- no lymphadenopathy, no thyromegaly, no jugular vein distension, no carotid bruit Cardiovascular- normal s1,s2, no murmurs/ rubs/ gallops; she has some mild edema noted Respiratory- bilateral clear to auscultation, no wheeze, no rhonchi, no crackles, no use of accessory muscles Abdomen- bowel sounds present, abdominal distention; resonant to  percussion; non-tender; no organomegaly  Musculoskeletal- left side paralysis noted; able to move right upper/lower extremities.  Neurological- she is alert and oriented to person [name] and time.  Skin- warm and dry    SIGNIFICANT DIAGNOSTIC EXAMS   02-12-14: left upper extremity doppler: negative for dvt  02-12-14: chest x-ray: no acute disease; negative for chf  02-12-14: kub: 1. Colonic constipation or feces stasis 2. Nonspecific notable gaseous gastric distention chronicity underlying etiology known. 3. No other acute  abnormality  02-14-14: left hip x-ray: hairline break in the cortex at the base of the lesser trochanter   02-14-14: left hip x-ray at ED: 1. No acute fracture or malalignment. 2. Heterotopic ossification the extends from the left anterior inferior iliac spine toward the greater trochanter. 3. Cortical buttressing and mild periosteal reaction along the lateral aspect of the left femoral diaphysis adjacent to the distal tip of the femoral stem suggests stress reaction.    03-12-14: left hip xray: s/p left total hip arthroplasty, no acute osseous abnormality  03-12-14: abd xray: correlate clinically for significant colonic constipation or fecal stasis, no other acute abd abnormality evident   LABS REVIEWED:   01-05-14: urine culture: no growth 01-06-14: wbc 9.4; hgb 10.8; hct 35.5; mcv 98.3 plt 319; glucose 313; bun 33; creat 0.5; k+4.2; na++137 liver normal albumin 3.0 chol 166; ldl 104; trig 156; tsh 4.429  hgb a1c 9.3 02-12-14: wbc 7.6; hgb 10.4; hct 32.2; mcv 93; plt 288; glucose 242; bun 17; creat 0.72; k+4.1; na++137 Urine culture: no growth, lupus panel mostly negative except ANA pos 02-14-14: wbc 10.9; hgb 7.7; hct 24.4; mcv 94.2; plt 347; glucose 144; bun 13; creat 0.47; k+3.9; na++136; liver normal albumin 2.7; BNP 1090 02-17-14: BNP 24  03/03/14:glcuose 277; bun 22; creat 0.8; k+4.2; na++134   03/06/14: UA: 50,000 of Proteus Mirabilis 03/10/14:wbc 7.8; hgb 10.5; hct  mcv 95.1;  plt 308; glucose 308; bun 27; creat 0.6; na++ 132 Liver normal albumin 3.1 amylase 12; lipase 7  03-19-14: glcuose 348; bun 27; creat 0.7; k+4.4; na++133  04-07-14: hgb a1c 10.5 04-17-14: wbc 8.1; hgb 11.6; hct 38.3; mcv 88; plt 269  07-04-14: A1C 8.4   Assessment/Plan  1. Diabetes: last A1C 8.4; this continues to remain the same; sugars averaging 150-175s; will continue  levemir 60 units nightly and 20 units in the am; will continue tradjenta 5 mg daily; will continue humalog to 16 units with meals with an additional 10  units for cbg >=150.  and will monitor   2. Chronic pain: she is followed by the pain clinic; she is happy with this;  will continue her oxycodone 15 mg 5 times daily and daily as needed; voltaren gel to her feet nightly; flexeril 5 mg twice daily and baclofen 10 mg three times daily for spasticity; and lyrica 100 mg nightly   3. COPD: is stable;  will continue tudorza 2 puffs daily and albuterol 1 puff every 6 hours as needed and will monitor her status; has O2 as needed at bedside   4. CAD: no current issues;will continue imdur 30 mg daily; and will monitor  5. Hypertension: stable; will continue imdur 30 mg  daily and will monitor  6. Dyslipidemia: will continue crestor 40 mg daily and lovaza 2 gm daily; her last ldl is 104 and trig 154   7. Hypothyroidism: will continue synthroid 25 mcg daily  8. Anxiety: currently feels more anxious; will continue zoloft 50 mg daily and klonopin 0.5 mg every 6 hours as needed remeron 45 mg nightly and will monitor; spoke with Vaughan Basta, psych, and both felt it was not needed to increase klonopin at this time; will continue to monitor pt at this time   9. Insomnia:sleeping well;  will continue melatonin 6 mg nighlty   10. Smoker: not on medications will monitor   11. Constipation: improving; will continue linzess 290 mcg daily daily ; will continue gas x 160 mg three times daily for bloating will increase colace to  200 mg twice daily    12. Hemiparesis as late effect cva: currently no issues; will continue her baclofen 10 mg four times daily for her spasticity and will continue her flexeril 5 mg twice daily is being followed by the pain clinic for her pain management issues.  to monitor her status.   13. Edema: stable; will continue lasix 40 mg daily and aldactone 25 mg twice daily and will monitor   14. Allergic rhinitis: no issues; continue flonase daily   Westley Foots, [place] NP [facility]  Contact [phone] Monday through Friday 8am- 5pm  After hours call [phone]

## 2014-07-20 DIAGNOSIS — F329 Major depressive disorder, single episode, unspecified: Secondary | ICD-10-CM | POA: Diagnosis not present

## 2014-07-20 DIAGNOSIS — G4700 Insomnia, unspecified: Secondary | ICD-10-CM | POA: Diagnosis not present

## 2014-07-20 DIAGNOSIS — F411 Generalized anxiety disorder: Secondary | ICD-10-CM | POA: Diagnosis not present

## 2014-07-27 DIAGNOSIS — M79601 Pain in right arm: Secondary | ICD-10-CM | POA: Diagnosis not present

## 2014-07-27 DIAGNOSIS — Z79899 Other long term (current) drug therapy: Secondary | ICD-10-CM | POA: Diagnosis not present

## 2014-07-27 DIAGNOSIS — M79602 Pain in left arm: Secondary | ICD-10-CM | POA: Diagnosis not present

## 2014-07-27 DIAGNOSIS — G894 Chronic pain syndrome: Secondary | ICD-10-CM | POA: Diagnosis not present

## 2014-08-03 ENCOUNTER — Non-Acute Institutional Stay (SKILLED_NURSING_FACILITY): Payer: Medicare Other | Admitting: Adult Health

## 2014-08-03 DIAGNOSIS — IMO0002 Reserved for concepts with insufficient information to code with codable children: Secondary | ICD-10-CM

## 2014-08-03 DIAGNOSIS — E1165 Type 2 diabetes mellitus with hyperglycemia: Principal | ICD-10-CM

## 2014-08-03 DIAGNOSIS — E1149 Type 2 diabetes mellitus with other diabetic neurological complication: Secondary | ICD-10-CM

## 2014-08-03 DIAGNOSIS — E1141 Type 2 diabetes mellitus with diabetic mononeuropathy: Secondary | ICD-10-CM | POA: Diagnosis not present

## 2014-08-04 ENCOUNTER — Encounter: Payer: Self-pay | Admitting: Adult Health

## 2014-08-04 MED ORDER — INSULIN DETEMIR 100 UNIT/ML ~~LOC~~ SOLN: 30.0000 [IU] | Freq: Two times a day (BID) | SUBCUTANEOUS | Status: DC

## 2014-08-04 MED ORDER — INSULIN LISPRO 100 UNIT/ML ~~LOC~~ SOLN: 15.0000 [IU] | Freq: Three times a day (TID) | SUBCUTANEOUS | Status: DC

## 2014-08-04 NOTE — Progress Notes (Signed)
Patient ID: Susan [name], female   DOB: 26-Aug-1951, 63 y.o.   MRN: [id number]  [facility]     Allergies  Allergen Reactions  . Niaspan [Niacin Er] Other (See Comments)    unknown  . Other     onions  . Valium [Diazepam] Other (See Comments)    unknown  . Wellbutrin [Bupropion] Other (See Comments)    unknown       Chief Complaint  Patient presents with  . Acute Visit    diabetes     HPI:  Nursing staff reports that her all of her cbg's are all elevated anywhere from 300-400. She is taking her insulin without difficulty. Is on metformin 500 mg twice daily; and tradjenta daily.  She is also complaining of left ear pain due to increased ear wax.   Past Medical History  Diagnosis Date  . Hyperlipidemia   . Diabetes mellitus without complication   . COPD (chronic obstructive pulmonary disease)   . Depression   . Hypertension   . Thyroid disease   . Anemia   . CAD (coronary artery disease)   . Chronic pain   . Hemiplegia affecting non-dominant side, post-stroke   . Fall   . Urine retention   . Stroke     left side paralysis    Past Surgical History  Procedure Laterality Date  . Joint replacement      bil hip  . Abdominal hysterectomy    . Cholecystectomy    . Appendectomy    . Tonsillectomy      VITAL SIGNS BP 128/68 mmHg  Pulse 70  Ht 5\' 3"  (1.6 m)  Wt 181 lb (82.101 kg)  BMI 32.07 kg/m2   Outpatient Encounter Prescriptions as of 08/03/2014  Medication Sig  . Aclidinium Bromide (TUDORZA PRESSAIR IN) Inhale 2 puffs into the lungs daily.  Marland Kitchen albuterol (PROVENTIL HFA;VENTOLIN HFA) 108 (90 BASE) MCG/ACT inhaler Inhale 1 puff into the lungs every 6 (six) hours as needed for wheezing or shortness of breath.  . Amino Acids-Protein Hydrolys (FEEDING SUPPLEMENT, PRO-STAT SUGAR FREE 64,) LIQD Take 30 mLs by mouth 2 (two) times daily.  . baclofen (LIORESAL) 10 MG tablet Take 10 mg by mouth 3 (three) times daily.   . clonazePAM (KLONOPIN) 0.5 MG tablet Take one  tablet by mouth every 6 hours as needed for anxiety  . cyclobenzaprine (FLEXERIL) 5 MG tablet Take 5 mg by mouth 2 (two) times daily.  . diclofenac sodium (VOLTAREN) 1 % GEL Apply 2 g topically at bedtime. To both feet  . docusate sodium (COLACE) 100 MG capsule Take 2 capsules (200 mg total) by mouth 2 (two) times daily.  . fluticasone (FLONASE) 50 MCG/ACT nasal spray Place 2 sprays into both nostrils daily.  . furosemide (LASIX) 20 MG tablet Take 2 tablets (40 mg total) by mouth daily.  . insulin detemir (LEVEMIR) 100 UNIT/ML injection Inject 20-60 Units into the skin 2 (two) times daily. 20 units in the morning and 60 units at bedtime.  . insulin lispro (HUMALOG) 100 UNIT/ML injection Inject 10-16 Units into the skin 3 (three) times daily before meals. 16 units before meals and 10 additional cbg >150  . isosorbide mononitrate (IMDUR) 30 MG 24 hr tablet Take 30 mg by mouth daily.  Marland Kitchen levothyroxine (SYNTHROID, LEVOTHROID) 25 MCG tablet Take 25 mcg by mouth daily before breakfast.  . Linaclotide (LINZESS) 290 MCG CAPS capsule Take 290 mcg by mouth daily.  Marland Kitchen linagliptin (TRADJENTA) 5 MG TABS tablet Take  5 mg by mouth daily.  . Melatonin 3 MG TABS Take 6 mg by mouth at bedtime.  . metFORMIN (GLUCOPHAGE) 500 MG tablet Take 500 mg by mouth 2 (two) times daily with a meal.  . mirtazapine (REMERON) 30 MG tablet Take 30 mg by mouth at bedtime.  Marland Kitchen omega-3 acid ethyl esters (LOVAZA) 1 G capsule Take 2 g by mouth daily.  . OxyCODONE HCl (ROXICODONE PO) Take 15 mg by mouth 5 (five) times daily. And daily as needed  . pregabalin (LYRICA) 100 MG capsule Take 100 mg by mouth at bedtime.  . rosuvastatin (CRESTOR) 40 MG tablet Take 40 mg by mouth every evening.   . sertraline (ZOLOFT) 50 MG tablet Take 50 mg by mouth daily.  . simethicone (MYLICON) 80 MG chewable tablet Chew 160 mg by mouth 3 (three) times daily.   Marland Kitchen spironolactone (ALDACTONE) 25 MG tablet Take 25 mg by mouth 2 (two) times daily.       SIGNIFICANT DIAGNOSTIC EXAMS  02-12-14: left upper extremity doppler: negative for dvt  02-12-14: chest x-ray: no acute disease; negative for chf  02-12-14: kub: 1. Colonic constipation or feces stasis 2. Nonspecific notable gaseous gastric distention chronicity underlying etiology known. 3. No other acute abnormality  02-14-14: left hip x-ray: hairline break in the cortex at the base of the lesser trochanter   02-14-14: left hip x-ray at ED: 1. No acute fracture or malalignment. 2. Heterotopic ossification the extends from the left anterior inferior iliac spine toward the greater trochanter. 3. Cortical buttressing and mild periosteal reaction along the lateral aspect of the left femoral diaphysis adjacent to the distal tip of the femoral stem suggests stress reaction.    03-12-14: left hip xray: s/p left total hip arthroplasty, no acute osseous abnormality  03-12-14: abd xray: correlate clinically for significant colonic constipation or fecal stasis, no other acute abd abnormality evident   LABS REVIEWED:   01-05-14: urine culture: no growth 01-06-14: wbc 9.4; hgb 10.8; hct 35.5; mcv 98.3 plt 319; glucose 313; bun 33; creat 0.5; k+4.2; na++137 liver normal albumin 3.0 chol 166; ldl 104; trig 156; tsh 4.429  hgb a1c 9.3 02-12-14: wbc 7.6; hgb 10.4; hct 32.2; mcv 93; plt 288; glucose 242; bun 17; creat 0.72; k+4.1; na++137 Urine culture: no growth, lupus panel mostly negative except ANA pos 02-14-14: wbc 10.9; hgb 7.7; hct 24.4; mcv 94.2; plt 347; glucose 144; bun 13; creat 0.47; k+3.9; na++136; liver normal albumin 2.7; BNP 1090 02-17-14: BNP 24  03/03/14:glcuose 277; bun 22; creat 0.8; k+4.2; na++134   03/06/14: UA: 50,000 of Proteus Mirabilis 03/10/14:wbc 7.8; hgb 10.5; hct  mcv 95.1; plt 308; glucose 308; bun 27; creat 0.6; na++ 132 Liver normal albumin 3.1 amylase 12; lipase 7  03-19-14: glcuose 348; bun 27; creat 0.7; k+4.4; na++133  04-07-14: hgb a1c 10.5 04-17-14: wbc 8.1; hgb 11.6; hct 38.3; mcv  88; plt 269  05-22-14: pre-albumin 16.7; albumin 3.5  06-16-14: hgb a1c 8.4; urine for micro-albumin 0.2 07-04-14: A1C 8.4     Review of Systems  Constitutional: Negative for malaise/fatigue.  HENT: Positive for ear pain.        Left ear pain due to ear wax   Respiratory: Negative for cough and shortness of breath.   Cardiovascular: Negative for chest pain and palpitations.  Gastrointestinal: Negative for heartburn, abdominal pain and constipation.  Musculoskeletal: Negative for myalgias and joint pain.  Skin: Negative.   Psychiatric/Behavioral: The patient is not nervous/anxious.  Physical Exam  Constitutional: No distress.  Overweight   HENT:  Has increased left ear wax present   Neck: Neck supple. No JVD present. No thyromegaly present.  Cardiovascular: Normal rate, regular rhythm and intact distal pulses.   Respiratory: Effort normal and breath sounds normal. No respiratory distress. She has no wheezes.  GI: Soft. Bowel sounds are normal. She exhibits no distension. There is no tenderness.  Musculoskeletal:  Left side hemiparesis   Neurological: She is alert.  Skin: Skin is warm and dry. She is not diaphoretic.       ASSESSMENT/ PLAN:  1. Diabetes: increase metformin to 1 gm twice daily; will increase levemir to 30 units in am and 60 units nightly; will increase humalog to 25 units before meals with an additional 15 units for cbg >=150.   2. Increased ear wax and pain left ear: will use debrox to ear twice daily for one week then have nursing irrigate.     Ok Edwards NP [facility]  Contact [phone] Monday through Friday 8am- 5pm  After hours call [phone]

## 2014-08-04 NOTE — Progress Notes (Signed)
This encounter was created in error - please disregard.

## 2014-08-10 DIAGNOSIS — G4700 Insomnia, unspecified: Secondary | ICD-10-CM | POA: Diagnosis not present

## 2014-08-10 DIAGNOSIS — F329 Major depressive disorder, single episode, unspecified: Secondary | ICD-10-CM | POA: Diagnosis not present

## 2014-08-10 DIAGNOSIS — F411 Generalized anxiety disorder: Secondary | ICD-10-CM | POA: Diagnosis not present

## 2014-08-11 ENCOUNTER — Non-Acute Institutional Stay (SKILLED_NURSING_FACILITY): Payer: Medicare Other | Admitting: Registered Nurse

## 2014-08-11 ENCOUNTER — Encounter: Payer: Self-pay | Admitting: Registered Nurse

## 2014-08-11 DIAGNOSIS — IMO0002 Reserved for concepts with insufficient information to code with codable children: Secondary | ICD-10-CM

## 2014-08-11 DIAGNOSIS — E1149 Type 2 diabetes mellitus with other diabetic neurological complication: Secondary | ICD-10-CM

## 2014-08-11 DIAGNOSIS — F32A Depression, unspecified: Secondary | ICD-10-CM

## 2014-08-11 DIAGNOSIS — E1141 Type 2 diabetes mellitus with diabetic mononeuropathy: Secondary | ICD-10-CM

## 2014-08-11 DIAGNOSIS — J449 Chronic obstructive pulmonary disease, unspecified: Secondary | ICD-10-CM

## 2014-08-11 DIAGNOSIS — I2510 Atherosclerotic heart disease of native coronary artery without angina pectoris: Secondary | ICD-10-CM | POA: Diagnosis not present

## 2014-08-11 DIAGNOSIS — I10 Essential (primary) hypertension: Secondary | ICD-10-CM

## 2014-08-11 DIAGNOSIS — E039 Hypothyroidism, unspecified: Secondary | ICD-10-CM

## 2014-08-11 DIAGNOSIS — K5909 Other constipation: Secondary | ICD-10-CM | POA: Diagnosis not present

## 2014-08-11 DIAGNOSIS — I69854 Hemiplegia and hemiparesis following other cerebrovascular disease affecting left non-dominant side: Secondary | ICD-10-CM

## 2014-08-11 DIAGNOSIS — H6123 Impacted cerumen, bilateral: Secondary | ICD-10-CM

## 2014-08-11 DIAGNOSIS — G8929 Other chronic pain: Secondary | ICD-10-CM | POA: Diagnosis not present

## 2014-08-11 DIAGNOSIS — L299 Pruritus, unspecified: Secondary | ICD-10-CM

## 2014-08-11 DIAGNOSIS — F329 Major depressive disorder, single episode, unspecified: Secondary | ICD-10-CM

## 2014-08-11 DIAGNOSIS — R110 Nausea: Secondary | ICD-10-CM

## 2014-08-11 DIAGNOSIS — G4700 Insomnia, unspecified: Secondary | ICD-10-CM | POA: Diagnosis not present

## 2014-08-11 DIAGNOSIS — I69354 Hemiplegia and hemiparesis following cerebral infarction affecting left non-dominant side: Secondary | ICD-10-CM

## 2014-08-11 DIAGNOSIS — E1165 Type 2 diabetes mellitus with hyperglycemia: Secondary | ICD-10-CM

## 2014-08-11 DIAGNOSIS — E785 Hyperlipidemia, unspecified: Secondary | ICD-10-CM

## 2014-08-11 NOTE — Progress Notes (Signed)
Patient ID: Susan [name], female   DOB: 10/19/1950, 63 y.o.   MRN: [id number]   Place of Service: [facility]  Allergies  Allergen Reactions  . Niaspan [Niacin Er] Other (See Comments)    unknown  . Other     onions  . Valium [Diazepam] Other (See Comments)    unknown  . Wellbutrin [Bupropion] Other (See Comments)    unknown    Code Status: DNR  Goals of Care: Comfort and Quality of Life/LTC  Chief Complaint  Patient presents with  . Medical Management of Chronic Issues    old CVA, chronic pain, COPD, CAD, HTN, insomnia, AR    HPI 63 y.o. female with PMH of old cva s/p left hemiplegia, chronic pain, insomnia, depression/anxietry, uncontrolled DM2, HTN, among others is being seen for a routine visit for management of her chronic issues. Weight stable. No recent fall or skin concerns reported. No change in behaviors or functional status reported. No concerns from staff. Patient reported needing her earwax removed and would like to have an appt with her orthopedist, Dr. Venetia Night of [facility] so she can get an MRI of her right knee. She told me she needed right knee replacement. She also reported having ruptured tendon of her left foot and wanted to have surgery so she can wear shoes.   Review of Systems Constitutional: Negative for fever and chills HENT: Negative for congestion, and sore throat. Positive for Left ear pain Eyes: Negative for eye pain, eye discharge, and visual disturbance  Cardiovascular: Negative for chest pain, palpitations, and leg swelling Respiratory: Negative cough, shortness of breath, and wheezing.  Gastrointestinal: Positive for nausea. Genitourinary: Negative for  dysuria Musculoskeletal: Positive for generalized pain Neurological: Negative for  and headache Skin: Negative for rash and wound.   Psychiatric: Positive for anxiety and depression  Past Medical History  Diagnosis Date  . Hyperlipidemia   . Diabetes mellitus  without complication   . COPD (chronic obstructive pulmonary disease)   . Depression   . Hypertension   . Thyroid disease   . Anemia   . CAD (coronary artery disease)   . Chronic pain   . Hemiplegia affecting non-dominant side, post-stroke   . Fall   . Urine retention   . Stroke     left side paralysis    Past Surgical History  Procedure Laterality Date  . Joint replacement      bil hip  . Abdominal hysterectomy    . Cholecystectomy    . Appendectomy    . Tonsillectomy      History   Social History  . Marital Status: Married    Spouse Name: N/A    Number of Children: N/A  . Years of Education: N/A   Occupational History  . Not on file.   Social History Main Topics  . Smoking status: Former Smoker    Types: Cigarettes  . Smokeless tobacco: Not on file  . Alcohol Use: No  . Drug Use: No  . Sexual Activity: Not on file   Other Topics Concern  . Not on file   Social History Narrative      Medication List       This list is accurate as of: 08/11/14  9:32 PM.  Always use your most recent med list.               albuterol 108 (90 BASE) MCG/ACT inhaler  Commonly known as:  PROVENTIL HFA;VENTOLIN HFA  Inhale  1 puff into the lungs every 6 (six) hours as needed for wheezing or shortness of breath.     baclofen 10 MG tablet  Commonly known as:  LIORESAL  Take 10 mg by mouth 3 (three) times daily.     clonazePAM 0.5 MG tablet  Commonly known as:  KLONOPIN  Take one tablet by mouth every 6 hours as needed for anxiety     cyclobenzaprine 5 MG tablet  Commonly known as:  FLEXERIL  Take 5 mg by mouth 2 (two) times daily.     diclofenac sodium 1 % Gel  Commonly known as:  VOLTAREN  Apply 2 g topically at bedtime. To both feet     docusate sodium 100 MG capsule  Commonly known as:  COLACE  Take 2 capsules (200 mg total) by mouth 2 (two) times daily.     feeding supplement (PRO-STAT SUGAR FREE 64) Liqd  Take 30 mLs by mouth 2 (two) times daily.      fluticasone 50 MCG/ACT nasal spray  Commonly known as:  FLONASE  Place 2 sprays into both nostrils daily.     furosemide 20 MG tablet  Commonly known as:  LASIX  Take 2 tablets (40 mg total) by mouth daily.     insulin detemir 100 UNIT/ML injection  Commonly known as:  LEVEMIR  Inject 0.3-0.6 mLs (30-60 Units total) into the skin 2 (two) times daily. 30 units in the morning and 60 units at bedtime.     insulin lispro 100 UNIT/ML injection  Commonly known as:  HUMALOG  Inject 0.15-0.25 mLs (15-25 Units total) into the skin 3 (three) times daily before meals. 25 units prior to meals with an additional 15 units prior to meals for cbg >=150     isosorbide mononitrate 30 MG 24 hr tablet  Commonly known as:  IMDUR  Take 30 mg by mouth daily.     levothyroxine 25 MCG tablet  Commonly known as:  SYNTHROID, LEVOTHROID  Take 25 mcg by mouth daily before breakfast.     linagliptin 5 MG Tabs tablet  Commonly known as:  TRADJENTA  Take 5 mg by mouth daily.     LINZESS 290 MCG Caps capsule  Generic drug:  Linaclotide  Take 290 mcg by mouth daily.     Melatonin 3 MG Tabs  Take 6 mg by mouth at bedtime.     metFORMIN 500 MG tablet  Commonly known as:  GLUCOPHAGE  Take 1,000 mg by mouth 2 (two) times daily with a meal.     mirtazapine 30 MG tablet  Commonly known as:  REMERON  Take 30 mg by mouth at bedtime.     omega-3 acid ethyl esters 1 G capsule  Commonly known as:  LOVAZA  Take 2 g by mouth daily.     pregabalin 100 MG capsule  Commonly known as:  LYRICA  Take 100 mg by mouth at bedtime.     rosuvastatin 40 MG tablet  Commonly known as:  CRESTOR  Take 40 mg by mouth every evening.     ROXICODONE PO  Take 15 mg by mouth 5 (five) times daily. And daily as needed     sertraline 50 MG tablet  Commonly known as:  ZOLOFT  Take 50 mg by mouth daily.     simethicone 80 MG chewable tablet  Commonly known as:  MYLICON  Chew 329 mg by mouth 3 (three) times daily.      spironolactone 25 MG tablet  Commonly known as:  ALDACTONE  Take 25 mg by mouth 2 (two) times daily.     TUDORZA PRESSAIR IN  Inhale 2 puffs into the lungs daily.        Physical Exam Filed Vitals:   08/11/14 2125  BP: 117/63  Pulse: 79  Temp: 97.2 F (36.2 C)  Resp: 15   Constitutional: Obese adult female in no acute distress. Conversant.  HEENT: Normocephalic and atraumatic. PERRL. EOM intact. Bil ear canal completely obstructed with cerumen. Left maxillary sinus tender to palpation. Oral mucosa moist. Posterior pharynx clear of any exudate or lesions.  Neck: Supple and nontender. No lymphadenopathy, masses, or thyromegaly. No JVD or carotid bruits. Cardiac: Normal S1, S2. RRR without appreciable murmurs, rubs, or gallops. Distal pulses intact. Trace edema of BLE Lungs: No respiratory distress. Breath sounds clear bilaterally without rales, rhonchi, or wheezes. Abdomen: Audible bowel sounds in all quadrants. Firm, nontender, nondistended.   Musculoskeletal: Able to move right extremities. Left hemiplegia. Left foot drop noted. Both feet tender to palpation Skin: Warm and dry. No rash noted.Skin of abdomen very dry and flaky.  Neurological: Alert and oriented to person, place, and time.  Psychiatric: Judgment and insight adequate. Appropriate mood and affect.   Labs Reviewed  CBC Latest Ref Rng 05/16/2014 04/17/2014 03/10/2014  WBC 4.0 - 10.5 K/uL 6.7 8.1 7.8  Hemoglobin 12.0 - 15.0 g/dL 11.2(L) 11.6(A) 10.5(A)  Hematocrit 36.0 - 46.0 % 36.0 38 35(A)  Platelets 150 - 400 K/uL 264 269 308    CMP Latest Ref Rng 05/16/2014 03/19/2014 03/10/2014  Glucose 70 - 99 mg/dL 310(H) - -  BUN 6 - 23 mg/dL 24(H) 27(A) 27(A)  Creatinine 0.50 - 1.10 mg/dL 0.59 0.7 0.6  Sodium 137 - 147 mEq/L 137 133(A) 132(A)  Potassium 3.7 - 5.3 mEq/L 4.3 4.4 4.1  Chloride 96 - 112 mEq/L 94(L) - -  CO2 19 - 32 mEq/L 30 - -  Calcium 8.4 - 10.5 mg/dL 9.1 - -  Total Protein 6.0 - 8.3 g/dL 7.8 - -  Total  Bilirubin 0.3 - 1.2 mg/dL 0.2(L) - -  Alkaline Phos 39 - 117 U/L 96 - 92  AST 0 - 37 U/L 14 - 12(A)  ALT 0 - 35 U/L 11 - 8    Lab Results  Component Value Date   HGBA1C 8.4* 07/08/2014    Lab Results  Component Value Date   TSH 4.43 01/06/2014    Lipid Panel     Component Value Date/Time   CHOL 166 01/06/2014   TRIG 156 01/06/2014   LDLCALC 105 01/06/2014    Assessment & Plan  1. Type II diabetes mellitus with neurological manifestations, uncontrolle Had adjustment to her diabetic medications recently. Since adjustment, CBGs range from 155 to 287. Continue metformin 1g twice daily, levemir 30 units daily in the morning and 60 units nightly, humalog 25 units three times daily before meals with additional 15units for cbg >150, and trajenta 5mg  daily. Currently not on ace/arb. Will check microalbumin/creatinine ratio. Foot exam up to date. Will have nursing staff add patient to opthalmology's list. Continue to monitor for now.   2. Other specified hypothyroidism Stable. Continue synthroid 37mcg daily and monitor  3. Other constipation Stable. Continue colace 200mg  twice daily and linzess 258mcg daily. Continue to monitor.   4. Chronic obstructive pulmonary disease, unspecified COPD, unspecified chronic bronchitis type Stable. Continue tudorza 2 puffs daily and albuterol Q6H as needed for shortness of breath and wheezing.  5. Coronary  artery disease involving native coronary artery of native heart without angina pectoris No chest pain reported. Continue imdur 30mg  daily and monitor. Continue statin.   6. Essential hypertension, benign Stable. Continue lasix 40mg  daily, aldactone 25mg  twice daily.   7. Hemiparesis affecting left side as late effect of cerebrovascular accident Stable. Continue baclofen three times daily and flexeril 5mg  twice daily for spasticity. Her chronic pain is being followed by pain clinic.   8. Insomnia Stable. Continue melatonin 6mg  nightly and  monitor   9. Chronic pain On-going. Followed by pain clinic. Continue lyrica 100mg  daily at bedtime, oxycodone 15mg  five times daily, flexeril 5mg  twice daily, baclofen 10mg  three time daily, and voltaren gel 2 g topically to both feet nightly. Per patient's request, will having nursing staff schedule her an appt with Dr. Cecille Aver of [facility] for further evaluation of Right knee pain and left foot drop for possible surgical interventions. Per patient, she needed a knee replacement and she want to fix her left foot so she can wear shoes.   10. Cerumen impaction, bilateral Patient has been getting debrox in both ears for a few days. Nursing staff to irrigate bilateral ears.   11. Skin pruritus Eucerin ointment to skin of abdomen BID. Continue kenalog cream and monitor.   12. Depression and anxiety Stable. Continue remeron 30mg  daily, zoloft 50mg  daily, and klonopin 0.5mg  Q6H as needed for anxiety. Continue to monitor for change in mood. Psychotherapy services initiated.   13. HLD Continue crestor 40mg  daily and lovaza 2g daily. Will recheck lipid panel  14. Nausea Promethazine 25mg  Q8H prn nausea. Continue to monitor.   Diagnostic Studies/Labs Ordered: Lipids, CMP, urine microalbumin/creatinine ratio.   Time spent: >1 hr with patient  Family/Staff Communication Plan of care discussed with resident and nursing staff. Resident and nursing staff verbalized understanding and agree with plan of care. No additional questions or concerns reported.    Arthur Holms, MSN, AGNP-C [facility] [address] [place], [place] [address] [phone] [8am-5pm] After hours: [phone]

## 2014-08-12 ENCOUNTER — Other Ambulatory Visit: Payer: Self-pay | Admitting: *Deleted

## 2014-08-12 DIAGNOSIS — N390 Urinary tract infection, site not specified: Secondary | ICD-10-CM | POA: Diagnosis not present

## 2014-08-12 DIAGNOSIS — A0682 Other amebic genitourinary infections: Secondary | ICD-10-CM | POA: Diagnosis not present

## 2014-08-12 MED ORDER — OXYCODONE HCL 15 MG PO TABS: ORAL_TABLET | ORAL | Status: DC

## 2014-08-12 NOTE — Telephone Encounter (Signed)
[facility] 

## 2014-08-13 DIAGNOSIS — F39 Unspecified mood [affective] disorder: Secondary | ICD-10-CM | POA: Diagnosis not present

## 2014-08-13 DIAGNOSIS — F419 Anxiety disorder, unspecified: Secondary | ICD-10-CM | POA: Diagnosis not present

## 2014-08-13 DIAGNOSIS — E119 Type 2 diabetes mellitus without complications: Secondary | ICD-10-CM | POA: Diagnosis not present

## 2014-08-13 LAB — LIPID PANEL
Cholesterol: 191 mg/dL (ref 0–200)
HDL: 30 mg/dL — AB (ref 35–70)
LDL Cholesterol: 132 mg/dL
Triglycerides: 144 mg/dL (ref 40–160)

## 2014-08-17 DIAGNOSIS — E440 Moderate protein-calorie malnutrition: Secondary | ICD-10-CM | POA: Diagnosis not present

## 2014-08-17 LAB — BASIC METABOLIC PANEL
BUN: 17 mg/dL (ref 4–21)
Creatinine: 0.5 mg/dL (ref 0.5–1.1)
Glucose: 217 mg/dL
Potassium: 4.4 mmol/L (ref 3.4–5.3)
SODIUM: 136 mmol/L — AB (ref 137–147)

## 2014-08-18 DIAGNOSIS — F419 Anxiety disorder, unspecified: Secondary | ICD-10-CM | POA: Diagnosis not present

## 2014-08-18 DIAGNOSIS — F39 Unspecified mood [affective] disorder: Secondary | ICD-10-CM | POA: Diagnosis not present

## 2014-08-19 DIAGNOSIS — M79674 Pain in right toe(s): Secondary | ICD-10-CM | POA: Diagnosis not present

## 2014-08-19 DIAGNOSIS — M79675 Pain in left toe(s): Secondary | ICD-10-CM | POA: Diagnosis not present

## 2014-08-19 DIAGNOSIS — B351 Tinea unguium: Secondary | ICD-10-CM | POA: Diagnosis not present

## 2014-08-26 ENCOUNTER — Non-Acute Institutional Stay (SKILLED_NURSING_FACILITY): Payer: Medicare Other | Admitting: Registered Nurse

## 2014-08-26 DIAGNOSIS — J019 Acute sinusitis, unspecified: Secondary | ICD-10-CM | POA: Diagnosis not present

## 2014-08-27 ENCOUNTER — Encounter: Payer: Self-pay | Admitting: Registered Nurse

## 2014-08-27 DIAGNOSIS — G5791 Unspecified mononeuropathy of right lower limb: Secondary | ICD-10-CM | POA: Diagnosis not present

## 2014-08-27 DIAGNOSIS — G90519 Complex regional pain syndrome I of unspecified upper limb: Secondary | ICD-10-CM | POA: Diagnosis not present

## 2014-08-27 DIAGNOSIS — M1990 Unspecified osteoarthritis, unspecified site: Secondary | ICD-10-CM | POA: Diagnosis not present

## 2014-08-27 DIAGNOSIS — G5692 Unspecified mononeuropathy of left upper limb: Secondary | ICD-10-CM | POA: Diagnosis not present

## 2014-08-27 DIAGNOSIS — G894 Chronic pain syndrome: Secondary | ICD-10-CM | POA: Diagnosis not present

## 2014-08-27 DIAGNOSIS — M179 Osteoarthritis of knee, unspecified: Secondary | ICD-10-CM | POA: Diagnosis not present

## 2014-08-27 DIAGNOSIS — G5691 Unspecified mononeuropathy of right upper limb: Secondary | ICD-10-CM | POA: Diagnosis not present

## 2014-08-27 DIAGNOSIS — G5792 Unspecified mononeuropathy of left lower limb: Secondary | ICD-10-CM | POA: Diagnosis not present

## 2014-08-27 NOTE — Progress Notes (Signed)
Patient ID: Susan [name], female   DOB: Apr 28, 1951, 63 y.o.   MRN: [id number]   Place of Service: [facility]  Allergies  Allergen Reactions  . Niaspan [Niacin Er] Other (See Comments)    unknown  . Other     onions  . Valium [Diazepam] Other (See Comments)    unknown  . Wellbutrin [Bupropion] Other (See Comments)    unknown    Code Status: DNR  Goals of Care: Comfort and Quality of Life/LTC  Chief Complaint  Patient presents with  . Acute Visit    sinus pressure and earache    HPI 63 y.o. female with PMH of old cva s/p left hemiplegia, chronic pain, insomnia, depression/anxietry, uncontrolled DM2, HTN, among others is being seen for an acute visit for persistent left earache and facial pressure for more than 2 weeks. She denies any fever, chills, headache, dizziness, shortness of breath, chest pain, nausea and vomiting. All other ROS negative.   Past Medical History  Diagnosis Date  . Hyperlipidemia   . Diabetes mellitus without complication   . COPD (chronic obstructive pulmonary disease)   . Depression   . Hypertension   . Thyroid disease   . Anemia   . CAD (coronary artery disease)   . Chronic pain   . Hemiplegia affecting non-dominant side, post-stroke   . Fall   . Urine retention   . Stroke     left side paralysis    Past Surgical History  Procedure Laterality Date  . Joint replacement      bil hip  . Abdominal hysterectomy    . Cholecystectomy    . Appendectomy    . Tonsillectomy      History   Social History  . Marital Status: Married    Spouse Name: N/A    Number of Children: N/A  . Years of Education: N/A   Occupational History  . Not on file.   Social History Main Topics  . Smoking status: Former Smoker    Types: Cigarettes  . Smokeless tobacco: Not on file  . Alcohol Use: No  . Drug Use: No  . Sexual Activity: Not on file   Other Topics Concern  . Not on file   Social History Narrative      Medication List         This list is accurate as of: 08/26/14 11:59 PM.  Always use your most recent med list.               albuterol 108 (90 BASE) MCG/ACT inhaler  Commonly known as:  PROVENTIL HFA;VENTOLIN HFA  Inhale 1 puff into the lungs every 6 (six) hours as needed for wheezing or shortness of breath.     baclofen 10 MG tablet  Commonly known as:  LIORESAL  Take 10 mg by mouth 3 (three) times daily.     clonazePAM 0.5 MG tablet  Commonly known as:  KLONOPIN  Take one tablet by mouth every 6 hours as needed for anxiety     cyclobenzaprine 5 MG tablet  Commonly known as:  FLEXERIL  Take 5 mg by mouth 2 (two) times daily.     diclofenac sodium 1 % Gel  Commonly known as:  VOLTAREN  Apply 2 g topically at bedtime. To both feet     docusate sodium 100 MG capsule  Commonly known as:  COLACE  Take 2 capsules (200 mg total) by mouth 2 (two) times daily.     feeding  supplement (PRO-STAT SUGAR FREE 64) Liqd  Take 30 mLs by mouth 2 (two) times daily.     fluticasone 50 MCG/ACT nasal spray  Commonly known as:  FLONASE  Place 2 sprays into both nostrils daily.     furosemide 20 MG tablet  Commonly known as:  LASIX  Take 2 tablets (40 mg total) by mouth daily.     insulin detemir 100 UNIT/ML injection  Commonly known as:  LEVEMIR  Inject 0.3-0.6 mLs (30-60 Units total) into the skin 2 (two) times daily. 30 units in the morning and 60 units at bedtime.     insulin lispro 100 UNIT/ML injection  Commonly known as:  HUMALOG  Inject 0.15-0.25 mLs (15-25 Units total) into the skin 3 (three) times daily before meals. 25 units prior to meals with an additional 15 units prior to meals for cbg >=150     isosorbide mononitrate 30 MG 24 hr tablet  Commonly known as:  IMDUR  Take 30 mg by mouth daily.     levothyroxine 25 MCG tablet  Commonly known as:  SYNTHROID, LEVOTHROID  Take 25 mcg by mouth daily before breakfast.     linagliptin 5 MG Tabs tablet  Commonly known as:  TRADJENTA  Take 5 mg by  mouth daily.     LINZESS 290 MCG Caps capsule  Generic drug:  Linaclotide  Take 290 mcg by mouth daily.     Melatonin 3 MG Tabs  Take 6 mg by mouth at bedtime.     metFORMIN 500 MG tablet  Commonly known as:  GLUCOPHAGE  Take 1,000 mg by mouth 2 (two) times daily with a meal.     mirtazapine 30 MG tablet  Commonly known as:  REMERON  Take 30 mg by mouth at bedtime.     omega-3 acid ethyl esters 1 G capsule  Commonly known as:  LOVAZA  Take 2 g by mouth daily.     oxyCODONE 15 MG immediate release tablet  Commonly known as:  ROXICODONE  Take one tablet by mouth 5 times daily at 5am,9am, 1pm,5pm 9pm for pain; Take one tablet by mouth as needed between 10pm-5am for breakthrough pain     pregabalin 100 MG capsule  Commonly known as:  LYRICA  Take 100 mg by mouth at bedtime.     rosuvastatin 40 MG tablet  Commonly known as:  CRESTOR  Take 40 mg by mouth every evening.     sertraline 50 MG tablet  Commonly known as:  ZOLOFT  Take 50 mg by mouth daily.     simethicone 80 MG chewable tablet  Commonly known as:  MYLICON  Chew 025 mg by mouth 3 (three) times daily.     spironolactone 25 MG tablet  Commonly known as:  ALDACTONE  Take 25 mg by mouth 2 (two) times daily.     TUDORZA PRESSAIR IN  Inhale 2 puffs into the lungs daily.        Physical Exam Filed Vitals:   08/26/14 0948  BP: 129/82  Pulse: 82  Temp: 97.4 F (36.3 C)  Resp: 18   Constitutional: Obese adult female in no acute distress. Conversant and pleasant.   HEENT: Normocephalic and atraumatic. PERRL. EOM intact. Bil ear canal partially obstructed with cerumen. Bilateral maxillary and frontal sinus tender to palpation. Oral mucosa moist. Posterior pharynx clear of any exudate or lesions.  Neck: Supple and nontender.No lymphadenopathy, masses, or thyromegaly. No JVD or carotid bruits. Cardiac: Normal S1, S2. RRR without  appreciable murmurs, rubs, or gallops. Distal pulses intact. Trace edema of  BLE Lungs: No respiratory distress. Breath sounds clear bilaterally without rales, rhonchi, or wheezes. Abdomen: Audible bowel sounds in all quadrants. Firm, nontender, nondistended.   Musculoskeletal: Able to move right extremities. Left hemiplegia. Left foot drop noted. Both feet tender to palpation Skin: Warm and dry. No rash noted. Neurological: Alert and oriented to person, place, and time.  Psychiatric: Judgment and insight adequate. Appropriate mood and affect.   Labs Reviewed  CBC Latest Ref Rng 05/16/2014 04/17/2014 03/10/2014  WBC 4.0 - 10.5 K/uL 6.7 8.1 7.8  Hemoglobin 12.0 - 15.0 g/dL 11.2(L) 11.6(A) 10.5(A)  Hematocrit 36.0 - 46.0 % 36.0 38 35(A)  Platelets 150 - 400 K/uL 264 269 308    CMP Latest Ref Rng 05/16/2014 03/19/2014 03/10/2014  Glucose 70 - 99 mg/dL 310(H) - -  BUN 6 - 23 mg/dL 24(H) 27(A) 27(A)  Creatinine 0.50 - 1.10 mg/dL 0.59 0.7 0.6  Sodium 137 - 147 mEq/L 137 133(A) 132(A)  Potassium 3.7 - 5.3 mEq/L 4.3 4.4 4.1  Chloride 96 - 112 mEq/L 94(L) - -  CO2 19 - 32 mEq/L 30 - -  Calcium 8.4 - 10.5 mg/dL 9.1 - -  Total Protein 6.0 - 8.3 g/dL 7.8 - -  Total Bilirubin 0.3 - 1.2 mg/dL 0.2(L) - -  Alkaline Phos 39 - 117 U/L 96 - 92  AST 0 - 37 U/L 14 - 12(A)  ALT 0 - 35 U/L 11 - 8    Lab Results  Component Value Date   HGBA1C 8.4* 07/08/2014    Assessment & Plan  1. Acute sinusitis with symptoms > 10 days Amoxicillin 500mg  tiwe daily x 10 days with florastor 250mg  twice daily for 2 weeks. Encourage hydration. Continue to monitor   Family/Staff Communication Plan of care discussed with resident and nursing staff. Resident and nursing staff verbalized understanding and agree with plan of care. No additional questions or concerns reported.    Arthur Holms, MSN, AGNP-C [facility] [address] [place], [place] [address] [phone] [8am-5pm] After hours: [phone]

## 2014-08-30 ENCOUNTER — Emergency Department (HOSPITAL_COMMUNITY)
Admission: EM | Admit: 2014-08-30 | Discharge: 2014-08-31 | Disposition: A | Discharge status: Home / Self Care | Payer: Medicare Other | Attending: Emergency Medicine | Admitting: Emergency Medicine

## 2014-08-30 ENCOUNTER — Encounter (HOSPITAL_COMMUNITY): Payer: Self-pay | Admitting: *Deleted

## 2014-08-30 DIAGNOSIS — I471 Supraventricular tachycardia: Secondary | ICD-10-CM | POA: Diagnosis not present

## 2014-08-30 DIAGNOSIS — R51 Headache: Secondary | ICD-10-CM | POA: Diagnosis not present

## 2014-08-30 DIAGNOSIS — Z87891 Personal history of nicotine dependence: Secondary | ICD-10-CM | POA: Insufficient documentation

## 2014-08-30 DIAGNOSIS — J449 Chronic obstructive pulmonary disease, unspecified: Secondary | ICD-10-CM | POA: Diagnosis not present

## 2014-08-30 DIAGNOSIS — Z794 Long term (current) use of insulin: Secondary | ICD-10-CM | POA: Insufficient documentation

## 2014-08-30 DIAGNOSIS — Z79899 Other long term (current) drug therapy: Secondary | ICD-10-CM | POA: Insufficient documentation

## 2014-08-30 DIAGNOSIS — Z7951 Long term (current) use of inhaled steroids: Secondary | ICD-10-CM | POA: Insufficient documentation

## 2014-08-30 DIAGNOSIS — E785 Hyperlipidemia, unspecified: Secondary | ICD-10-CM | POA: Diagnosis not present

## 2014-08-30 DIAGNOSIS — E079 Disorder of thyroid, unspecified: Secondary | ICD-10-CM | POA: Insufficient documentation

## 2014-08-30 DIAGNOSIS — F329 Major depressive disorder, single episode, unspecified: Secondary | ICD-10-CM | POA: Insufficient documentation

## 2014-08-30 DIAGNOSIS — Z8673 Personal history of transient ischemic attack (TIA), and cerebral infarction without residual deficits: Secondary | ICD-10-CM | POA: Diagnosis not present

## 2014-08-30 DIAGNOSIS — G8929 Other chronic pain: Secondary | ICD-10-CM | POA: Diagnosis not present

## 2014-08-30 DIAGNOSIS — R000 Tachycardia, unspecified: Secondary | ICD-10-CM | POA: Diagnosis present

## 2014-08-30 DIAGNOSIS — E119 Type 2 diabetes mellitus without complications: Secondary | ICD-10-CM | POA: Diagnosis not present

## 2014-08-30 DIAGNOSIS — R0602 Shortness of breath: Secondary | ICD-10-CM | POA: Diagnosis not present

## 2014-08-30 DIAGNOSIS — I2510 Atherosclerotic heart disease of native coronary artery without angina pectoris: Secondary | ICD-10-CM | POA: Insufficient documentation

## 2014-08-30 DIAGNOSIS — Z862 Personal history of diseases of the blood and blood-forming organs and certain disorders involving the immune mechanism: Secondary | ICD-10-CM | POA: Insufficient documentation

## 2014-08-30 DIAGNOSIS — I10 Essential (primary) hypertension: Secondary | ICD-10-CM | POA: Diagnosis not present

## 2014-08-30 LAB — CBC WITH DIFFERENTIAL/PLATELET
BASOS ABS: 0 10*3/uL (ref 0.0–0.1)
Basophils Relative: 0 % (ref 0–1)
EOS ABS: 0.1 10*3/uL (ref 0.0–0.7)
EOS PCT: 1 % (ref 0–5)
HEMATOCRIT: 34.9 % — AB (ref 36.0–46.0)
Hemoglobin: 11 g/dL — ABNORMAL LOW (ref 12.0–15.0)
LYMPHS PCT: 24 % (ref 12–46)
Lymphs Abs: 2.1 10*3/uL (ref 0.7–4.0)
MCH: 26.4 pg (ref 26.0–34.0)
MCHC: 31.5 g/dL (ref 30.0–36.0)
MCV: 83.7 fL (ref 78.0–100.0)
MONO ABS: 0.4 10*3/uL (ref 0.1–1.0)
Monocytes Relative: 5 % (ref 3–12)
Neutro Abs: 5.9 10*3/uL (ref 1.7–7.7)
Neutrophils Relative %: 70 % (ref 43–77)
Platelets: 264 10*3/uL (ref 150–400)
RBC: 4.17 MIL/uL (ref 3.87–5.11)
RDW: 16.2 % — ABNORMAL HIGH (ref 11.5–15.5)
WBC: 8.5 10*3/uL (ref 4.0–10.5)

## 2014-08-30 MED ORDER — ONDANSETRON HCL 4 MG/2ML IJ SOLN
4.0000 mg | Freq: Once | INTRAMUSCULAR | Status: AC
  Administered 2014-08-30: 4 mg via INTRAVENOUS
  Filled 2014-08-30: qty 2

## 2014-08-30 MED ORDER — ACETAMINOPHEN 325 MG PO TABS: 650.0000 mg | ORAL_TABLET | Freq: Once | ORAL | Status: DC

## 2014-08-30 NOTE — ED Provider Notes (Signed)
CSN: [id number]     Arrival date & time 08/30/14  2138 History   First MD Initiated Contact with Patient 08/30/14 2155     Chief Complaint  Patient presents with  . Tachycardia     (Consider location/radiation/quality/duration/timing/severity/associated sxs/prior Treatment) Patient is a 63 y.o. female presenting with palpitations. The history is provided by the patient and the EMS personnel.  Palpitations Palpitations quality:  Regular Onset quality:  Sudden Duration:  30 minutes Timing:  Constant Progression:  Resolved Chronicity:  Recurrent Relieved by: adenosine via EMS. Associated symptoms: no back pain, no chest pain, no chest pressure, no cough, no diaphoresis, no dizziness, no lower extremity edema, no nausea, no near-syncope, no numbness, no shortness of breath and no vomiting     Past Medical History  Diagnosis Date  . Hyperlipidemia   . Diabetes mellitus without complication   . COPD (chronic obstructive pulmonary disease)   . Depression   . Hypertension   . Thyroid disease   . Anemia   . CAD (coronary artery disease)   . Chronic pain   . Hemiplegia affecting non-dominant side, post-stroke   . Fall   . Urine retention   . Stroke     left side paralysis   Past Surgical History  Procedure Laterality Date  . Joint replacement      bil hip  . Abdominal hysterectomy    . Cholecystectomy    . Appendectomy    . Tonsillectomy     No family history on file. History  Substance Use Topics  . Smoking status: Former Smoker    Types: Cigarettes  . Smokeless tobacco: Not on file  . Alcohol Use: No   OB History    No data available     Review of Systems  Constitutional: Negative for fever, chills, diaphoresis, activity change, appetite change and fatigue.  HENT: Negative for facial swelling, rhinorrhea, sore throat, trouble swallowing and voice change.   Eyes: Negative for photophobia, pain and visual disturbance.  Respiratory: Negative for cough, shortness  of breath, wheezing and stridor.   Cardiovascular: Positive for palpitations. Negative for chest pain, leg swelling and near-syncope.  Gastrointestinal: Negative for nausea, vomiting, abdominal pain, constipation and anal bleeding.  Endocrine: Negative.   Genitourinary: Negative for dysuria, vaginal bleeding, vaginal discharge and vaginal pain.  Musculoskeletal: Positive for arthralgias (Left hip pain at baseline per patient). Negative for myalgias and back pain.  Skin: Negative.  Negative for rash.  Allergic/Immunologic: Negative.   Neurological: Positive for headaches. Negative for dizziness, tremors, syncope, weakness and numbness.  Psychiatric/Behavioral: Negative for suicidal ideas, sleep disturbance and self-injury.  All other systems reviewed and are negative.     Allergies  Niaspan; Other; Valium; and Wellbutrin  Home Medications   Prior to Admission medications   Medication Sig Start Date End Date Taking? Authorizing Provider  Aclidinium Bromide (TUDORZA PRESSAIR IN) Inhale 2 puffs into the lungs daily.    Historical Provider, MD  albuterol (PROVENTIL HFA;VENTOLIN HFA) 108 (90 BASE) MCG/ACT inhaler Inhale 1 puff into the lungs every 6 (six) hours as needed for wheezing or shortness of breath.    Historical Provider, MD  Amino Acids-Protein Hydrolys (FEEDING SUPPLEMENT, PRO-STAT SUGAR FREE 64,) LIQD Take 30 mLs by mouth 2 (two) times daily.    Historical Provider, MD  baclofen (LIORESAL) 10 MG tablet Take 10 mg by mouth 3 (three) times daily.  03/27/14   Gerlene Fee, NP  clonazePAM (KLONOPIN) 0.5 MG tablet Take one tablet by mouth  every 6 hours as needed for anxiety 06/24/14   Blanchie Serve, MD  cyclobenzaprine (FLEXERIL) 5 MG tablet Take 5 mg by mouth 2 (two) times daily.    Historical Provider, MD  diclofenac sodium (VOLTAREN) 1 % GEL Apply 2 g topically at bedtime. To both feet    Historical Provider, MD  docusate sodium (COLACE) 100 MG capsule Take 2 capsules (200 mg  total) by mouth 2 (two) times daily. 07/01/14   Gerlene Fee, NP  fluticasone (FLONASE) 50 MCG/ACT nasal spray Place 2 sprays into both nostrils daily.    Historical Provider, MD  furosemide (LASIX) 20 MG tablet Take 2 tablets (40 mg total) by mouth daily. 02/16/14   Gerlene Fee, NP  insulin detemir (LEVEMIR) 100 UNIT/ML injection Inject 0.3-0.6 mLs (30-60 Units total) into the skin 2 (two) times daily. 30 units in the morning and 60 units at bedtime. 08/04/14   Gerlene Fee, NP  insulin lispro (HUMALOG) 100 UNIT/ML injection Inject 0.15-0.25 mLs (15-25 Units total) into the skin 3 (three) times daily before meals. 25 units prior to meals with an additional 15 units prior to meals for cbg >=150 08/04/14   Gerlene Fee, NP  isosorbide mononitrate (IMDUR) 30 MG 24 hr tablet Take 30 mg by mouth daily.    Historical Provider, MD  levothyroxine (SYNTHROID, LEVOTHROID) 25 MCG tablet Take 25 mcg by mouth daily before breakfast.    Historical Provider, MD  Linaclotide (LINZESS) 290 MCG CAPS capsule Take 290 mcg by mouth daily.    Historical Provider, MD  linagliptin (TRADJENTA) 5 MG TABS tablet Take 5 mg by mouth daily.    Historical Provider, MD  Melatonin 3 MG TABS Take 6 mg by mouth at bedtime.    Historical Provider, MD  metFORMIN (GLUCOPHAGE) 500 MG tablet Take 1,000 mg by mouth 2 (two) times daily with a meal.     Historical Provider, MD  mirtazapine (REMERON) 30 MG tablet Take 30 mg by mouth at bedtime.    Historical Provider, MD  omega-3 acid ethyl esters (LOVAZA) 1 G capsule Take 2 g by mouth daily.    Historical Provider, MD  oxyCODONE (ROXICODONE) 15 MG immediate release tablet Take one tablet by mouth 5 times daily at 5am,9am, 1pm,5pm 9pm for pain; Take one tablet by mouth as needed between 10pm-5am for breakthrough pain 08/12/14   Estill Dooms, MD  pregabalin (LYRICA) 100 MG capsule Take 100 mg by mouth at bedtime.    Historical Provider, MD  rosuvastatin (CRESTOR) 40 MG tablet Take  40 mg by mouth every evening.     Historical Provider, MD  sertraline (ZOLOFT) 50 MG tablet Take 50 mg by mouth daily.    Historical Provider, MD  simethicone (MYLICON) 80 MG chewable tablet Chew 160 mg by mouth 3 (three) times daily.  03/27/14   Gerlene Fee, NP  spironolactone (ALDACTONE) 25 MG tablet Take 25 mg by mouth 2 (two) times daily.     Historical Provider, MD   BP 100/44 mmHg  Pulse 77  Temp(Src) 98.2 F (36.8 C) (Oral)  Resp 10  Wt 184 lb (83.462 kg)  SpO2 98% Physical Exam  Constitutional: She is oriented to person, place, and time. She appears well-developed and well-nourished. No distress.  HENT:  Head: Normocephalic and atraumatic.  Right Ear: External ear normal.  Left Ear: External ear normal.  Mouth/Throat: Oropharynx is clear and moist. No oropharyngeal exudate.  Eyes: Conjunctivae and EOM are normal. Pupils are equal,  round, and reactive to light. No scleral icterus.  Neck: Normal range of motion. Neck supple. No JVD present. No tracheal deviation present. No thyromegaly present.  Cardiovascular: Normal rate, regular rhythm and intact distal pulses.  Exam reveals no gallop and no friction rub.   No murmur heard. Pulmonary/Chest: Effort normal and breath sounds normal. No respiratory distress. She has no wheezes. She has no rales.  Abdominal: Soft. Bowel sounds are normal. She exhibits no distension. There is no tenderness.  Musculoskeletal: Normal range of motion. She exhibits tenderness (minor tenderness to left hip). She exhibits no edema.  Neurological: She is alert and oriented to person, place, and time. No cranial nerve deficit. She exhibits normal muscle tone. Coordination normal.  Skin: Skin is warm and dry. She is not diaphoretic. No pallor.  Psychiatric: She has a normal mood and affect. She expresses no homicidal and no suicidal ideation. She expresses no suicidal plans and no homicidal plans.  Nursing note and vitals reviewed.   ED Course   Procedures (including critical care time) Labs Review Labs Reviewed  CBC WITH DIFFERENTIAL - Abnormal; Notable for the following:    Hemoglobin 11.0 (*)    HCT 34.9 (*)    RDW 16.2 (*)    All other components within normal limits  BASIC METABOLIC PANEL - Abnormal; Notable for the following:    Sodium 135 (*)    Chloride 95 (*)    Glucose, Bld 179 (*)    All other components within normal limits    Imaging Review No results found.   EKG Interpretation   Date/Time:  Sunday August 30 2014 21:42:07 EST Ventricular Rate:  102 PR Interval:  135 QRS Duration: 68 QT Interval:  357 QTC Calculation: 465 R Axis:   -10 Text Interpretation:  Sinus tachycardia Left ventricular hypertrophy  Borderline T abnormalities, diffuse leads which is new Confirmed by  [place] ([id number]) on 08/30/2014 9:50:09 PM      MDM   Final diagnoses:  SVT (supraventricular tachycardia)    The patient is a 63 y.o. F with previous episodes of SVT who presents after delevoping palpitations and being found to be in SVT. Patient has given 6mg  adenosine x 1 via EMS and upon arrival to the ED has converted into NSR. Patient denies any symptoms of SOB or chest pain during her SVT stating she only felt palpitations and is asymptomatic on arrival to the ED. Electrolytes within normal range. Patient monitored in ED for 2 hours with continued normal vitals and NSR. Feel patient appropriate for outpatient management. Patient has Cardiologist in [place] and is also given a referral to [facility] as she has now moved into this area. Will not start a SV blocking agent as patient reports she has been hypotensive on these medications in the past. Patient expresses understanding and agreement with need to follow up with cardiology and is discharged home with standard ED return precautions.  Patient seen with attending, Dr. Tawnya Crook, who oversaw clinical decision making.   Margaretann Loveless, MD 08/31/14  [id number]  Ernestina Patches, MD 08/31/14 [id number]

## 2014-08-30 NOTE — ED Notes (Addendum)
Patient presents from [facility] with EMS reporting that when they arrived her heart rate was 228 and BP 90 palp.  They initiated an IV and administered Adenosine 6mg  and she converted to tachycardia   Upon arrival to teh ED patient stated she was very comfortable.

## 2014-08-31 DIAGNOSIS — R000 Tachycardia, unspecified: Secondary | ICD-10-CM | POA: Diagnosis not present

## 2014-08-31 DIAGNOSIS — H2513 Age-related nuclear cataract, bilateral: Secondary | ICD-10-CM | POA: Diagnosis not present

## 2014-08-31 DIAGNOSIS — Z7951 Long term (current) use of inhaled steroids: Secondary | ICD-10-CM | POA: Diagnosis not present

## 2014-08-31 DIAGNOSIS — E119 Type 2 diabetes mellitus without complications: Secondary | ICD-10-CM | POA: Diagnosis not present

## 2014-08-31 DIAGNOSIS — I6789 Other cerebrovascular disease: Secondary | ICD-10-CM | POA: Diagnosis not present

## 2014-08-31 LAB — BASIC METABOLIC PANEL
ANION GAP: 12 (ref 5–15)
BUN: 17 mg/dL (ref 6–23)
CALCIUM: 8.8 mg/dL (ref 8.4–10.5)
CO2: 28 meq/L (ref 19–32)
CREATININE: 0.66 mg/dL (ref 0.50–1.10)
Chloride: 95 mEq/L — ABNORMAL LOW (ref 96–112)
GFR calc Af Amer: >90 mL/min (ref >90)
GFR calc non Af Amer: >90 mL/min (ref >90)
GLUCOSE: 179 mg/dL — AB (ref 70–99)
Potassium: 4.4 mEq/L (ref 3.7–5.3)
Sodium: 135 mEq/L — ABNORMAL LOW (ref 137–147)

## 2014-08-31 NOTE — ED Notes (Signed)
Patient sleeping at this time.

## 2014-09-01 ENCOUNTER — Other Ambulatory Visit: Payer: Self-pay | Admitting: Internal Medicine

## 2014-09-01 ENCOUNTER — Encounter: Payer: Self-pay | Admitting: Internal Medicine

## 2014-09-01 ENCOUNTER — Ambulatory Visit (INDEPENDENT_AMBULATORY_CARE_PROVIDER_SITE_OTHER): Payer: Medicare Other | Admitting: Internal Medicine

## 2014-09-01 VITALS — BP 130/74 | HR 92

## 2014-09-01 DIAGNOSIS — R4182 Altered mental status, unspecified: Secondary | ICD-10-CM

## 2014-09-01 DIAGNOSIS — G894 Chronic pain syndrome: Secondary | ICD-10-CM | POA: Diagnosis not present

## 2014-09-01 DIAGNOSIS — F419 Anxiety disorder, unspecified: Secondary | ICD-10-CM | POA: Diagnosis not present

## 2014-09-01 DIAGNOSIS — Z1211 Encounter for screening for malignant neoplasm of colon: Secondary | ICD-10-CM | POA: Diagnosis not present

## 2014-09-01 DIAGNOSIS — I2510 Atherosclerotic heart disease of native coronary artery without angina pectoris: Secondary | ICD-10-CM | POA: Diagnosis not present

## 2014-09-01 DIAGNOSIS — I6930 Unspecified sequelae of cerebral infarction: Secondary | ICD-10-CM | POA: Diagnosis not present

## 2014-09-01 DIAGNOSIS — F39 Unspecified mood [affective] disorder: Secondary | ICD-10-CM | POA: Diagnosis not present

## 2014-09-01 NOTE — Patient Instructions (Signed)
We will fax the Cologuard request to [facility] will be hearing from them Their phone number is [phone] in case you need to contact them

## 2014-09-02 ENCOUNTER — Ambulatory Visit (HOSPITAL_COMMUNITY): Payer: Medicare Other

## 2014-09-02 NOTE — Progress Notes (Signed)
Patient ID: Susan [name], female   DOB: [birth year], 63 y.o.   MRN: [id number] HPI: Susan [name] is a 63 yo female with PMH of CVA with left hemi-plegia, chronic pain syndrome, hypertension, CAD, COPD, diabetes, hyperlipidemia, depression, SVT who is seen in consultation at the request of Dr. Bubba Camp to consider colorectal cancer screening. She is here today with her daughter. She lives in a skilled facility after her stroke which occurred 2 years ago in March 2014. She was also found to have carotid stenosis which has not been repaired. She reports she is interested in colonoscopy because she has never had colorectal cancer screening. She apparently nearly had a colonoscopy during hospitalization approximate 4 years ago. She was admitted with influenza and developed pneumonia with respiratory failure. Also during this time she had diarrhea with blood in her stool and colonoscopy was planned. She was apparently prepped for colonoscopy but before the test can be completed she developed SVT and the test was never performed. She reports that overall she feels well. She reports her bowel movements have been normal though she does have a history of constipation. She is taking chronic narcotic pain medication for chronic pain syndrome. She uses oxycodone 15 mg 5 times daily. She also uses Linzess 290 g daily which has resolved her opioid-induced constipation. She denies blood in her stool recently. She does note several months ago after diarrheal illness she had some rectal bleeding which resolved spontaneously. She denies rectal pain. She denies nausea or vomiting. Reportedly appetite is good and weight is been stable.  Past Medical History  Diagnosis Date  . Hyperlipidemia   . Diabetes mellitus without complication   . COPD (chronic obstructive pulmonary disease)   . Depression   . Hypertension   . Thyroid disease   . Anemia   . CAD (coronary artery disease)   . Chronic pain   . Hemiplegia affecting non-dominant  side, post-stroke   . Fall   . Urine retention   . Stroke     left side paralysis    Past Surgical History  Procedure Laterality Date  . Joint replacement      bil hip  . Abdominal hysterectomy    . Cholecystectomy    . Appendectomy    . Tonsillectomy      Outpatient Prescriptions Prior to Visit  Medication Sig Dispense Refill  . Aclidinium Bromide (TUDORZA PRESSAIR IN) Inhale 2 puffs into the lungs daily.    Marland Kitchen albuterol (PROVENTIL HFA;VENTOLIN HFA) 108 (90 BASE) MCG/ACT inhaler Inhale 1 puff into the lungs every 6 (six) hours as needed for wheezing or shortness of breath.    . Amino Acids-Protein Hydrolys (FEEDING SUPPLEMENT, PRO-STAT SUGAR FREE 64,) LIQD Take 30 mLs by mouth 2 (two) times daily.    . baclofen (LIORESAL) 10 MG tablet Take 10 mg by mouth 3 (three) times daily.     . clonazePAM (KLONOPIN) 0.5 MG tablet Take one tablet by mouth every 6 hours as needed for anxiety 120 tablet 5  . cyclobenzaprine (FLEXERIL) 5 MG tablet Take 5 mg by mouth 2 (two) times daily.    . diclofenac sodium (VOLTAREN) 1 % GEL Apply 2 g topically at bedtime. To both feet    . docusate sodium (COLACE) 100 MG capsule Take 2 capsules (200 mg total) by mouth 2 (two) times daily. 100 capsule 11  . fluticasone (FLONASE) 50 MCG/ACT nasal spray Place 2 sprays into both nostrils daily.    . furosemide (LASIX) 20 MG tablet Take  2 tablets (40 mg total) by mouth daily. 30 tablet 3  . insulin detemir (LEVEMIR) 100 UNIT/ML injection Inject 0.3-0.6 mLs (30-60 Units total) into the skin 2 (two) times daily. 30 units in the morning and 60 units at bedtime. 10 mL 11  . insulin lispro (HUMALOG) 100 UNIT/ML injection Inject 0.15-0.25 mLs (15-25 Units total) into the skin 3 (three) times daily before meals. 25 units prior to meals with an additional 15 units prior to meals for cbg >=150 10 mL 11  . isosorbide mononitrate (IMDUR) 30 MG 24 hr tablet Take 30 mg by mouth daily.    Marland Kitchen levothyroxine (SYNTHROID, LEVOTHROID) 25  MCG tablet Take 25 mcg by mouth daily before breakfast.    . Linaclotide (LINZESS) 290 MCG CAPS capsule Take 290 mcg by mouth daily.    Marland Kitchen linagliptin (TRADJENTA) 5 MG TABS tablet Take 5 mg by mouth daily.    . Melatonin 3 MG TABS Take 6 mg by mouth at bedtime.    . metFORMIN (GLUCOPHAGE) 500 MG tablet Take 1,000 mg by mouth 2 (two) times daily with a meal.     . mirtazapine (REMERON) 30 MG tablet Take 30 mg by mouth at bedtime.    Marland Kitchen omega-3 acid ethyl esters (LOVAZA) 1 G capsule Take 2 g by mouth daily.    Marland Kitchen oxyCODONE (ROXICODONE) 15 MG immediate release tablet Take one tablet by mouth 5 times daily at 5am,9am, 1pm,5pm 9pm for pain; Take one tablet by mouth as needed between 10pm-5am for breakthrough pain 180 tablet 0  . pregabalin (LYRICA) 100 MG capsule Take 100 mg by mouth at bedtime.    . rosuvastatin (CRESTOR) 40 MG tablet Take 40 mg by mouth every evening.     . sertraline (ZOLOFT) 50 MG tablet Take 50 mg by mouth daily.    . simethicone (MYLICON) 80 MG chewable tablet Chew 160 mg by mouth 3 (three) times daily.     Marland Kitchen spironolactone (ALDACTONE) 25 MG tablet Take 25 mg by mouth 2 (two) times daily.      No facility-administered medications prior to visit.    Allergies  Allergen Reactions  . Niaspan [Niacin Er] Other (See Comments)    unknown  . Other     onions  . Valium [Diazepam] Other (See Comments)    unknown  . Wellbutrin [Bupropion] Other (See Comments)    unknown    History reviewed. No pertinent family history.  History  Substance Use Topics  . Smoking status: Former Smoker    Types: Cigarettes  . Smokeless tobacco: Not on file  . Alcohol Use: No    ROS: As per history of present illness, otherwise negative  BP 130/74 mmHg  Pulse 92  Ht   Wt   SpO2 92% Constitutional: Chronically ill-appearing female in no acute distress, sitting in wheelchair HEENT: Normocephalic and atraumatic. Oropharynx is clear and moist. No oropharyngeal exudate. Conjunctivae are  normal.  No scleral icterus. Facial droop on the left Cardiovascular: Normal rate, regular rhythm and intact distal pulses.  Pulmonary/chest: Effort normal and breath sounds normal. No wheezing, rales or rhonchi. Abdominal: Soft, nontender, nondistended. Bowel sounds active throughout.  Extremities: no clubbing, cyanosis, trace edema left lower extremity Lymphadenopathy: No cervical adenopathy noted. Neurological: Alert and oriented to person place and time. Extended left lower extremity with foot drop, left arm bent 90 at side no movement in left arm Skin: Skin is warm and dry. No rashes noted. Psychiatric: Normal mood and affect. Behavior is normal.  RELEVANT LABS AND IMAGING: CBC    Component Value Date/Time   WBC 8.5 08/30/2014 2316   WBC 8.1 04/17/2014   RBC 4.17 08/30/2014 2316   HGB 11.0* 08/30/2014 2316   HCT 34.9* 08/30/2014 2316   PLT 264 08/30/2014 2316   MCV 83.7 08/30/2014 2316   MCH 26.4 08/30/2014 2316   MCHC 31.5 08/30/2014 2316   RDW 16.2* 08/30/2014 2316   LYMPHSABS 2.1 08/30/2014 2316   MONOABS 0.4 08/30/2014 2316   EOSABS 0.1 08/30/2014 2316   BASOSABS 0.0 08/30/2014 2316    CMP     Component Value Date/Time   NA 135* 08/30/2014 2316   NA 133* 03/19/2014   K 4.4 08/30/2014 2316   CL 95* 08/30/2014 2316   CO2 28 08/30/2014 2316   GLUCOSE 179* 08/30/2014 2316   BUN 17 08/30/2014 2316   BUN 27* 03/19/2014   CREATININE 0.66 08/30/2014 2316   CREATININE 0.7 03/19/2014   CALCIUM 8.8 08/30/2014 2316   PROT 7.8 05/16/2014 0114   ALBUMIN 3.1* 05/16/2014 0114   AST 14 05/16/2014 0114   ALT 11 05/16/2014 0114   ALKPHOS 96 05/16/2014 0114   BILITOT 0.2* 05/16/2014 0114   GFRNONAA >90 08/30/2014 2316   GFRAA >90 08/30/2014 2316    ASSESSMENT/PLAN: 63 yo female with PMH of CVA with left hemi-plegia, chronic pain syndrome, hypertension, CAD, COPD, diabetes, hyperlipidemia, depression, SVT who is seen in consultation at the request of Dr. Bubba Camp to consider  colorectal cancer screening  1. CRC screening -- she is requesting colonoscopy for colorectal cancer screening. We had a long discussion today regarding the risks and benefits of colonoscopy. I do think with her multiple medical problems that colonoscopy carries a higher than baseline risk for her. We discussed the nature of the procedure as well as the risks, benefits and alternatives. After this discussion the decision was made with the patient and her daughter to pursue Cologuard as a screening test. If this test is positive, she would need full colonoscopy. Anoscopy if performed would need to be done in the hospital setting with monitored anesthesia care.   2. Chronic pain -- on chronic narcotic therapy  3. CVA -- left hemiplegia, stable

## 2014-09-08 ENCOUNTER — Ambulatory Visit (HOSPITAL_COMMUNITY): Payer: Medicare Other

## 2014-09-09 ENCOUNTER — Telehealth: Payer: Self-pay | Admitting: Internal Medicine

## 2014-09-09 NOTE — Telephone Encounter (Signed)
I have spoken to Bowie at [employer]. They will send a new kit to [facility]. I have advised Juliann Pulse of this as well.

## 2014-09-10 ENCOUNTER — Ambulatory Visit (HOSPITAL_COMMUNITY): Payer: Medicare Other

## 2014-09-14 ENCOUNTER — Non-Acute Institutional Stay (SKILLED_NURSING_FACILITY): Payer: Medicare Other | Admitting: Registered Nurse

## 2014-09-14 DIAGNOSIS — F418 Other specified anxiety disorders: Secondary | ICD-10-CM

## 2014-09-14 DIAGNOSIS — E1141 Type 2 diabetes mellitus with diabetic mononeuropathy: Secondary | ICD-10-CM

## 2014-09-14 DIAGNOSIS — I69854 Hemiplegia and hemiparesis following other cerebrovascular disease affecting left non-dominant side: Secondary | ICD-10-CM | POA: Diagnosis not present

## 2014-09-14 DIAGNOSIS — R109 Unspecified abdominal pain: Secondary | ICD-10-CM | POA: Diagnosis not present

## 2014-09-14 DIAGNOSIS — E1165 Type 2 diabetes mellitus with hyperglycemia: Secondary | ICD-10-CM

## 2014-09-14 DIAGNOSIS — R140 Abdominal distension (gaseous): Secondary | ICD-10-CM | POA: Diagnosis not present

## 2014-09-14 DIAGNOSIS — E039 Hypothyroidism, unspecified: Secondary | ICD-10-CM

## 2014-09-14 DIAGNOSIS — E1149 Type 2 diabetes mellitus with other diabetic neurological complication: Secondary | ICD-10-CM

## 2014-09-14 DIAGNOSIS — I2510 Atherosclerotic heart disease of native coronary artery without angina pectoris: Secondary | ICD-10-CM | POA: Diagnosis not present

## 2014-09-14 DIAGNOSIS — G8929 Other chronic pain: Secondary | ICD-10-CM | POA: Diagnosis not present

## 2014-09-14 DIAGNOSIS — K5903 Drug induced constipation: Secondary | ICD-10-CM

## 2014-09-14 DIAGNOSIS — I10 Essential (primary) hypertension: Secondary | ICD-10-CM | POA: Diagnosis not present

## 2014-09-14 DIAGNOSIS — I69354 Hemiplegia and hemiparesis following cerebral infarction affecting left non-dominant side: Secondary | ICD-10-CM

## 2014-09-14 DIAGNOSIS — J449 Chronic obstructive pulmonary disease, unspecified: Secondary | ICD-10-CM | POA: Diagnosis not present

## 2014-09-14 DIAGNOSIS — K5909 Other constipation: Secondary | ICD-10-CM | POA: Diagnosis not present

## 2014-09-14 DIAGNOSIS — G4700 Insomnia, unspecified: Secondary | ICD-10-CM

## 2014-09-14 DIAGNOSIS — E785 Hyperlipidemia, unspecified: Secondary | ICD-10-CM

## 2014-09-14 DIAGNOSIS — IMO0002 Reserved for concepts with insufficient information to code with codable children: Secondary | ICD-10-CM

## 2014-09-15 ENCOUNTER — Encounter: Payer: Self-pay | Admitting: Registered Nurse

## 2014-09-15 ENCOUNTER — Ambulatory Visit (HOSPITAL_COMMUNITY)
Admission: RE | Admit: 2014-09-15 | Discharge: 2014-09-15 | Disposition: A | Discharge status: Home / Self Care | Payer: Medicare Other | Source: Ambulatory Visit | Attending: Internal Medicine | Admitting: Internal Medicine

## 2014-09-15 DIAGNOSIS — R4182 Altered mental status, unspecified: Secondary | ICD-10-CM | POA: Insufficient documentation

## 2014-09-15 DIAGNOSIS — J3489 Other specified disorders of nose and nasal sinuses: Secondary | ICD-10-CM | POA: Insufficient documentation

## 2014-09-15 DIAGNOSIS — I69354 Hemiplegia and hemiparesis following cerebral infarction affecting left non-dominant side: Secondary | ICD-10-CM | POA: Diagnosis not present

## 2014-09-15 DIAGNOSIS — G9389 Other specified disorders of brain: Secondary | ICD-10-CM | POA: Diagnosis not present

## 2014-09-15 DIAGNOSIS — G319 Degenerative disease of nervous system, unspecified: Secondary | ICD-10-CM | POA: Diagnosis not present

## 2014-09-15 DIAGNOSIS — F419 Anxiety disorder, unspecified: Secondary | ICD-10-CM | POA: Diagnosis not present

## 2014-09-15 DIAGNOSIS — R9082 White matter disease, unspecified: Secondary | ICD-10-CM | POA: Diagnosis not present

## 2014-09-15 DIAGNOSIS — F39 Unspecified mood [affective] disorder: Secondary | ICD-10-CM | POA: Diagnosis not present

## 2014-09-15 NOTE — Progress Notes (Signed)
Patient ID: Susan [name], female   DOB: 12/24/1950, 64 y.o.   MRN: [id number]   Place of Service: [facility]  Allergies  Allergen Reactions  . Niaspan [Niacin Er] Other (See Comments)    unknown  . Other     onions  . Valium [Diazepam] Other (See Comments)    unknown  . Wellbutrin [Bupropion] Other (See Comments)    unknown    Code Status: DNR  Goals of Care: Comfort and Quality of Life/LTC  Chief Complaint  Patient presents with  . Medical Management of Chronic Issues    DM2, chronic pain, constipation, copd, cad, htn, old cva, insomnia, depression with anxiety, hld    HPI 64 y.o. female with PMH of old cva s/p left hemiplegia, chronic pain, insomnia, depression with anxiety, uncontrolled DM2, HTN, hypothyroidism among others is being seen for a routine visit for management of her chronic issues. Weight overall stable since last routine visit. No recent fall or skin concerns reported. No change in behaviors or functional status reported. Nursing staff feels that her abd is more distended now. Otherwise, no concerns reported. Seen in room today. No complaints verbalized by patient-reported that her stomach has always been like that. Had large BM this morning. She is still having pain-followed by pain clinic. Recent LDL 132 while on crestor and lovaza. Hypothyroidism stable on current dose of levothyroxine. DM is poorly controlled as patient is noncompliant with dietary recommendations-eats sweets and snacks throughout the day, CBG range from 110-396. Constipation is stable on linzess and colace. She was recently sent to ER for SVT, since then has not has any chest pain.   Review of Systems Constitutional: Negative for fever and chills HENT: Negative for congestion, and sore throat.  Eyes: Negative for eye pain, eye discharge, and visual disturbance  Cardiovascular: Negative for chest pain, palpitations. Positive for leg swelling Respiratory: Negative cough, shortness of  breath, and wheezing.  Gastrointestinal: Negative for nausea, vomiting, and abdominal pain.  Genitourinary: Negative for  dysuria Musculoskeletal: Positive for generalized pain Neurological: Negative for  and headache and dizziness Skin: Negative for rash and wound.   Psychiatric: Positive for anxiety and depression. Negative for suicidal ideation.   Past Medical History  Diagnosis Date  . Hyperlipidemia   . Diabetes mellitus without complication   . COPD (chronic obstructive pulmonary disease)   . Depression   . Hypertension   . Thyroid disease   . Anemia   . CAD (coronary artery disease)   . Chronic pain   . Hemiplegia affecting non-dominant side, post-stroke   . Fall   . Urine retention   . Stroke     left side paralysis    Past Surgical History  Procedure Laterality Date  . Joint replacement      bil hip  . Abdominal hysterectomy    . Cholecystectomy    . Appendectomy    . Tonsillectomy      History   Social History  . Marital Status: Married    Spouse Name: N/A    Number of Children: N/A  . Years of Education: N/A   Occupational History  . Not on file.   Social History Main Topics  . Smoking status: Former Smoker    Types: Cigarettes  . Smokeless tobacco: Not on file  . Alcohol Use: No  . Drug Use: No  . Sexual Activity: Not on file   Other Topics Concern  . Not on file   Social History Narrative  Medication List       This list is accurate as of: 09/14/14 11:59 PM.  Always use your most recent med list.               albuterol 108 (90 BASE) MCG/ACT inhaler  Commonly known as:  PROVENTIL HFA;VENTOLIN HFA  Inhale 1 puff into the lungs every 6 (six) hours as needed for wheezing or shortness of breath.     baclofen 10 MG tablet  Commonly known as:  LIORESAL  Take 10 mg by mouth 3 (three) times daily.     clonazePAM 0.5 MG tablet  Commonly known as:  KLONOPIN  Take one tablet by mouth every 6 hours as needed for anxiety      cyclobenzaprine 5 MG tablet  Commonly known as:  FLEXERIL  Take 5 mg by mouth 2 (two) times daily.     diclofenac sodium 1 % Gel  Commonly known as:  VOLTAREN  Apply 2 g topically at bedtime. To both feet     docusate sodium 100 MG capsule  Commonly known as:  COLACE  Take 2 capsules (200 mg total) by mouth 2 (two) times daily.     feeding supplement (PRO-STAT SUGAR FREE 64) Liqd  Take 30 mLs by mouth 2 (two) times daily.     fluticasone 50 MCG/ACT nasal spray  Commonly known as:  FLONASE  Place 2 sprays into both nostrils daily.     furosemide 20 MG tablet  Commonly known as:  LASIX  Take 2 tablets (40 mg total) by mouth daily.     insulin detemir 100 UNIT/ML injection  Commonly known as:  LEVEMIR  Inject 0.3-0.6 mLs (30-60 Units total) into the skin 2 (two) times daily. 30 units in the morning and 60 units at bedtime.     insulin lispro 100 UNIT/ML injection  Commonly known as:  HUMALOG  Inject 0.15-0.25 mLs (15-25 Units total) into the skin 3 (three) times daily before meals. 25 units prior to meals with an additional 15 units prior to meals for cbg >=150     isosorbide mononitrate 30 MG 24 hr tablet  Commonly known as:  IMDUR  Take 30 mg by mouth daily.     levothyroxine 25 MCG tablet  Commonly known as:  SYNTHROID, LEVOTHROID  Take 25 mcg by mouth daily before breakfast.     linagliptin 5 MG Tabs tablet  Commonly known as:  TRADJENTA  Take 5 mg by mouth daily.     LINZESS 290 MCG Caps capsule  Generic drug:  Linaclotide  Take 290 mcg by mouth daily.     Melatonin 3 MG Tabs  Take 6 mg by mouth at bedtime.     metFORMIN 500 MG tablet  Commonly known as:  GLUCOPHAGE  Take 1,000 mg by mouth 2 (two) times daily with a meal.     mirtazapine 30 MG tablet  Commonly known as:  REMERON  Take 15 mg by mouth at bedtime.     nitroGLYCERIN 0.4 MG SL tablet  Commonly known as:  NITROSTAT  Place 0.4 mg under the tongue every 5 (five) minutes as needed for chest pain.       omega-3 acid ethyl esters 1 G capsule  Commonly known as:  LOVAZA  Take 2 g by mouth daily.     oxyCODONE 15 MG immediate release tablet  Commonly known as:  ROXICODONE  Take one tablet by mouth 5 times daily at 5am,9am, 1pm,5pm 9pm for pain; Take  one tablet by mouth as needed between 10pm-5am for breakthrough pain     pregabalin 100 MG capsule  Commonly known as:  LYRICA  Take 100 mg by mouth at bedtime.     rosuvastatin 40 MG tablet  Commonly known as:  CRESTOR  Take 40 mg by mouth every evening.     sertraline 50 MG tablet  Commonly known as:  ZOLOFT  Take 50 mg by mouth daily.     simethicone 80 MG chewable tablet  Commonly known as:  MYLICON  Chew 703 mg by mouth 3 (three) times daily.     spironolactone 25 MG tablet  Commonly known as:  ALDACTONE  Take 25 mg by mouth 2 (two) times daily.     TUDORZA PRESSAIR IN  Inhale 2 puffs into the lungs daily.        Physical Exam  BP 107/84 mmHg  Pulse 85  Temp(Src) 96.9 F (36.1 C)  Resp 20  Ht 5\' 3"  (1.6 m)  Wt 184 lb 9.6 oz (83.734 kg)  BMI 32.71 kg/m2  SpO2 98%  Constitutional: Obese adult female in no acute distress. Conversant and pleasant HEENT: Normocephalic and atraumatic. PERRL. EOM intact Oral mucosa moist. Posterior pharynx clear of any exudate or lesions.  Neck: Supple and nontender. No lymphadenopathy, masses, or thyromegaly. No JVD or carotid bruits. Cardiac: Normal S1, S2. RRR without appreciable murmurs, rubs, or gallops. Distal pulses intact. Trace edema of BLE Lungs: No respiratory distress. Breath sounds clear bilaterally without rales, rhonchi, or wheezes. Abdomen: Audible bowel sounds in all quadrants. Firm, more distended than previous visits, nontender.   Musculoskeletal: Able to move right extremities. Left hemiplegia. Left foot drop noted. Both feet tender to palpation Skin: Warm and dry. No rash noted.Skin of abdomen very dry and flaky.  Neurological: Alert and oriented to person,  place, and time.  Psychiatric: Judgment and insight adequate. Appropriate mood and affect.   Labs Reviewed  CBC Latest Ref Rng 08/30/2014 05/16/2014 04/17/2014  WBC 4.0 - 10.5 K/uL 8.5 6.7 8.1  Hemoglobin 12.0 - 15.0 g/dL 11.0(L) 11.2(L) 11.6(A)  Hematocrit 36.0 - 46.0 % 34.9(L) 36.0 38  Platelets 150 - 400 K/uL 264 264 269    CMP Latest Ref Rng 08/30/2014 08/17/2014 05/16/2014  Glucose 70 - 99 mg/dL 179(H) - 310(H)  BUN 6 - 23 mg/dL 17 17 24(H)  Creatinine 0.50 - 1.10 mg/dL 0.66 0.5 0.59  Sodium 137 - 147 mEq/L 135(L) 136(A) 137  Potassium 3.7 - 5.3 mEq/L 4.4 4.4 4.3  Chloride 96 - 112 mEq/L 95(L) - 94(L)  CO2 19 - 32 mEq/L 28 - 30  Calcium 8.4 - 10.5 mg/dL 8.8 - 9.1  Total Protein 6.0 - 8.3 g/dL - - 7.8  Total Bilirubin 0.3 - 1.2 mg/dL - - 0.2(L)  Alkaline Phos 39 - 117 U/L - - 96  AST 0 - 37 U/L - - 14  ALT 0 - 35 U/L - - 11    Lab Results  Component Value Date   HGBA1C 8.4* 07/08/2014    Lab Results  Component Value Date   TSH 4.43 01/06/2014    Lipid Panel     Component Value Date/Time   CHOL 191 08/13/2014   TRIG 144 08/13/2014   HDL 30* 08/13/2014   LDLCALC 132 08/13/2014    Assessment & Plan  1. Type II diabetes mellitus with neurological manifestations, uncontrolle CBG range between 110-396. Continue metformin 1g twice daily, levemir 30 units daily in the morning and  60 units nightly, humalog 25 units three times daily before meals with additional 15units for cbg >150, and trajenta 5mg  daily. Urine microalbumin normal . Foot exam and eye up to date. Encourage following dietary recommendations. Continue to monitor cbg and patient status. Continue statin   2. Other specified hypothyroidism Stable. Continue synthroid 28mcg daily and monitor. Recheck TSH  3. Constipation Stable. Continue colace 200mg  twice daily and linzess 239mcg daily. Continue to monitor.   4. Chronic obstructive pulmonary disease, unspecified COPD, unspecified chronic bronchitis  type Stable. Continue tudorza 2 puffs daily and albuterol Q6H as needed for shortness of breath and wheezing.  5. Coronary artery disease involving native coronary artery of native heart without angina pectoris Remains chest pain free since ED visit. Continue imdur 30mg  daily and nitro prn for chest pain.  Continue statin. Continue to monitor  6. Essential hypertension, benign Stable.  BP range 110s-140s/60s-80s. Continue lasix 40mg  daily and aldactone 25mg  twice daily. Continue to monitor BP  7. Hemiparesis affecting left side as late effect of cerebrovascular accident Stable. Continue baclofen three times daily and flexeril 5mg  twice daily for spasticity. Monitor clinically. Her chronic pain is being followed by pain clinic.   8. Insomnia Stable. Continue melatonin 6mg  nightly  9. Chronic pain On-going. Followed by pain clinic. Continue lyrica 100mg  daily at bedtime, oxycodone 15mg  five times daily, flexeril 5mg  twice daily, baclofen 10mg  three time daily, and voltaren gel 2 g topically to both feet nightly. Continue to monitor   10. Depression and anxiety Stable. Continue remeron 30mg  daily, zoloft 50mg  daily, and klonopin 0.5mg  Q6H as needed for anxiety. Continue to monitor for change in mood. Psychotherapy services initiated.   11. HLD Recent LDL 132. Continue crestor 40mg  daily and lovaza 2g daily. Continue to monitor  12. ABD distention  Will order KUB to rule out obstruction. Continue to monitor   Time spent: 40 minutes  Labs ordered: A1C and TSH  Family/Staff Communication Plan of care discussed with resident and nursing staff. Resident and nursing staff verbalized understanding and agree with plan of care. No additional questions or concerns reported.    Arthur Holms, MSN, AGNP-C [facility] [address] [place], [place] [address] [phone] [8am-5pm] After hours: [phone]

## 2014-09-16 DIAGNOSIS — E039 Hypothyroidism, unspecified: Secondary | ICD-10-CM | POA: Diagnosis not present

## 2014-09-16 DIAGNOSIS — E1165 Type 2 diabetes mellitus with hyperglycemia: Secondary | ICD-10-CM | POA: Diagnosis not present

## 2014-09-16 LAB — HEMOGLOBIN A1C: Hgb A1c MFr Bld: 9 % — AB (ref 4.0–6.0)

## 2014-09-16 LAB — TSH: TSH: 2.5 u[IU]/mL (ref 0.41–5.90)

## 2014-09-22 DIAGNOSIS — F419 Anxiety disorder, unspecified: Secondary | ICD-10-CM | POA: Diagnosis not present

## 2014-09-22 DIAGNOSIS — F39 Unspecified mood [affective] disorder: Secondary | ICD-10-CM | POA: Diagnosis not present

## 2014-09-24 ENCOUNTER — Other Ambulatory Visit: Payer: Self-pay | Admitting: *Deleted

## 2014-09-24 DIAGNOSIS — M1711 Unilateral primary osteoarthritis, right knee: Secondary | ICD-10-CM | POA: Diagnosis not present

## 2014-09-24 DIAGNOSIS — M21372 Foot drop, left foot: Secondary | ICD-10-CM | POA: Diagnosis not present

## 2014-09-24 MED ORDER — PREGABALIN 100 MG PO CAPS: ORAL_CAPSULE | ORAL | Status: DC

## 2014-09-24 NOTE — Telephone Encounter (Signed)
[facility] 

## 2014-09-25 DIAGNOSIS — H2513 Age-related nuclear cataract, bilateral: Secondary | ICD-10-CM | POA: Diagnosis not present

## 2014-09-28 DIAGNOSIS — G629 Polyneuropathy, unspecified: Secondary | ICD-10-CM | POA: Diagnosis not present

## 2014-09-28 DIAGNOSIS — G894 Chronic pain syndrome: Secondary | ICD-10-CM | POA: Diagnosis not present

## 2014-09-28 DIAGNOSIS — Z79899 Other long term (current) drug therapy: Secondary | ICD-10-CM | POA: Diagnosis not present

## 2014-09-28 DIAGNOSIS — M1990 Unspecified osteoarthritis, unspecified site: Secondary | ICD-10-CM | POA: Diagnosis not present

## 2014-09-29 DIAGNOSIS — M24572 Contracture, left ankle: Secondary | ICD-10-CM | POA: Diagnosis not present

## 2014-09-29 DIAGNOSIS — M216X9 Other acquired deformities of unspecified foot: Secondary | ICD-10-CM | POA: Diagnosis not present

## 2014-09-29 DIAGNOSIS — I69954 Hemiplegia and hemiparesis following unspecified cerebrovascular disease affecting left non-dominant side: Secondary | ICD-10-CM | POA: Diagnosis not present

## 2014-09-30 DIAGNOSIS — M216X9 Other acquired deformities of unspecified foot: Secondary | ICD-10-CM | POA: Diagnosis not present

## 2014-09-30 DIAGNOSIS — M24572 Contracture, left ankle: Secondary | ICD-10-CM | POA: Diagnosis not present

## 2014-09-30 DIAGNOSIS — I69954 Hemiplegia and hemiparesis following unspecified cerebrovascular disease affecting left non-dominant side: Secondary | ICD-10-CM | POA: Diagnosis not present

## 2014-10-01 DIAGNOSIS — M216X9 Other acquired deformities of unspecified foot: Secondary | ICD-10-CM | POA: Diagnosis not present

## 2014-10-01 DIAGNOSIS — M24572 Contracture, left ankle: Secondary | ICD-10-CM | POA: Diagnosis not present

## 2014-10-01 DIAGNOSIS — F419 Anxiety disorder, unspecified: Secondary | ICD-10-CM | POA: Diagnosis not present

## 2014-10-01 DIAGNOSIS — I69954 Hemiplegia and hemiparesis following unspecified cerebrovascular disease affecting left non-dominant side: Secondary | ICD-10-CM | POA: Diagnosis not present

## 2014-10-01 DIAGNOSIS — F39 Unspecified mood [affective] disorder: Secondary | ICD-10-CM | POA: Diagnosis not present

## 2014-10-02 DIAGNOSIS — I69954 Hemiplegia and hemiparesis following unspecified cerebrovascular disease affecting left non-dominant side: Secondary | ICD-10-CM | POA: Diagnosis not present

## 2014-10-02 DIAGNOSIS — M24572 Contracture, left ankle: Secondary | ICD-10-CM | POA: Diagnosis not present

## 2014-10-02 DIAGNOSIS — M216X9 Other acquired deformities of unspecified foot: Secondary | ICD-10-CM | POA: Diagnosis not present

## 2014-10-05 DIAGNOSIS — M24572 Contracture, left ankle: Secondary | ICD-10-CM | POA: Diagnosis not present

## 2014-10-05 DIAGNOSIS — I69954 Hemiplegia and hemiparesis following unspecified cerebrovascular disease affecting left non-dominant side: Secondary | ICD-10-CM | POA: Diagnosis not present

## 2014-10-05 DIAGNOSIS — M216X9 Other acquired deformities of unspecified foot: Secondary | ICD-10-CM | POA: Diagnosis not present

## 2014-10-06 DIAGNOSIS — M216X9 Other acquired deformities of unspecified foot: Secondary | ICD-10-CM | POA: Diagnosis not present

## 2014-10-06 DIAGNOSIS — M24572 Contracture, left ankle: Secondary | ICD-10-CM | POA: Diagnosis not present

## 2014-10-06 DIAGNOSIS — F39 Unspecified mood [affective] disorder: Secondary | ICD-10-CM | POA: Diagnosis not present

## 2014-10-06 DIAGNOSIS — I69954 Hemiplegia and hemiparesis following unspecified cerebrovascular disease affecting left non-dominant side: Secondary | ICD-10-CM | POA: Diagnosis not present

## 2014-10-06 DIAGNOSIS — F419 Anxiety disorder, unspecified: Secondary | ICD-10-CM | POA: Diagnosis not present

## 2014-10-07 DIAGNOSIS — I69954 Hemiplegia and hemiparesis following unspecified cerebrovascular disease affecting left non-dominant side: Secondary | ICD-10-CM | POA: Diagnosis not present

## 2014-10-07 DIAGNOSIS — M24572 Contracture, left ankle: Secondary | ICD-10-CM | POA: Diagnosis not present

## 2014-10-07 DIAGNOSIS — M216X9 Other acquired deformities of unspecified foot: Secondary | ICD-10-CM | POA: Diagnosis not present

## 2014-10-08 DIAGNOSIS — M24572 Contracture, left ankle: Secondary | ICD-10-CM | POA: Diagnosis not present

## 2014-10-08 DIAGNOSIS — M216X9 Other acquired deformities of unspecified foot: Secondary | ICD-10-CM | POA: Diagnosis not present

## 2014-10-08 DIAGNOSIS — I69954 Hemiplegia and hemiparesis following unspecified cerebrovascular disease affecting left non-dominant side: Secondary | ICD-10-CM | POA: Diagnosis not present

## 2014-10-09 DIAGNOSIS — M216X9 Other acquired deformities of unspecified foot: Secondary | ICD-10-CM | POA: Diagnosis not present

## 2014-10-09 DIAGNOSIS — M24572 Contracture, left ankle: Secondary | ICD-10-CM | POA: Diagnosis not present

## 2014-10-09 DIAGNOSIS — I69954 Hemiplegia and hemiparesis following unspecified cerebrovascular disease affecting left non-dominant side: Secondary | ICD-10-CM | POA: Diagnosis not present

## 2014-10-11 ENCOUNTER — Ambulatory Visit: Payer: Self-pay

## 2014-10-11 DIAGNOSIS — R300 Dysuria: Secondary | ICD-10-CM | POA: Diagnosis not present

## 2014-10-11 LAB — URINALYSIS, COMPLETE
Bilirubin,UR: NEGATIVE
Blood: NEGATIVE
Glucose,UR: NEGATIVE mg/dL (ref 0–75)
Hyaline Cast: 3
KETONE: NEGATIVE
Nitrite: POSITIVE
Ph: 5 (ref 4.5–8.0)
Protein: 30
RBC,UR: 2 /HPF (ref 0–5)
SPECIFIC GRAVITY: 1.02 (ref 1.003–1.030)
Squamous Epithelial: NONE SEEN

## 2014-10-12 DIAGNOSIS — M216X9 Other acquired deformities of unspecified foot: Secondary | ICD-10-CM | POA: Diagnosis not present

## 2014-10-12 DIAGNOSIS — M24572 Contracture, left ankle: Secondary | ICD-10-CM | POA: Diagnosis not present

## 2014-10-12 DIAGNOSIS — I69954 Hemiplegia and hemiparesis following unspecified cerebrovascular disease affecting left non-dominant side: Secondary | ICD-10-CM | POA: Diagnosis not present

## 2014-10-13 DIAGNOSIS — M216X9 Other acquired deformities of unspecified foot: Secondary | ICD-10-CM | POA: Diagnosis not present

## 2014-10-13 DIAGNOSIS — F419 Anxiety disorder, unspecified: Secondary | ICD-10-CM | POA: Diagnosis not present

## 2014-10-13 DIAGNOSIS — I69954 Hemiplegia and hemiparesis following unspecified cerebrovascular disease affecting left non-dominant side: Secondary | ICD-10-CM | POA: Diagnosis not present

## 2014-10-13 DIAGNOSIS — M24572 Contracture, left ankle: Secondary | ICD-10-CM | POA: Diagnosis not present

## 2014-10-13 DIAGNOSIS — F39 Unspecified mood [affective] disorder: Secondary | ICD-10-CM | POA: Diagnosis not present

## 2014-10-13 LAB — URINE CULTURE

## 2014-10-14 DIAGNOSIS — I69954 Hemiplegia and hemiparesis following unspecified cerebrovascular disease affecting left non-dominant side: Secondary | ICD-10-CM | POA: Diagnosis not present

## 2014-10-14 DIAGNOSIS — M24572 Contracture, left ankle: Secondary | ICD-10-CM | POA: Diagnosis not present

## 2014-10-14 DIAGNOSIS — M216X9 Other acquired deformities of unspecified foot: Secondary | ICD-10-CM | POA: Diagnosis not present

## 2014-10-15 ENCOUNTER — Non-Acute Institutional Stay (SKILLED_NURSING_FACILITY): Payer: Medicare Other | Admitting: Registered Nurse

## 2014-10-15 DIAGNOSIS — I2510 Atherosclerotic heart disease of native coronary artery without angina pectoris: Secondary | ICD-10-CM | POA: Diagnosis not present

## 2014-10-15 DIAGNOSIS — I10 Essential (primary) hypertension: Secondary | ICD-10-CM

## 2014-10-15 DIAGNOSIS — H66015 Acute suppurative otitis media with spontaneous rupture of ear drum, recurrent, left ear: Secondary | ICD-10-CM

## 2014-10-15 DIAGNOSIS — G8114 Spastic hemiplegia affecting left nondominant side: Secondary | ICD-10-CM | POA: Diagnosis not present

## 2014-10-15 DIAGNOSIS — K5909 Other constipation: Secondary | ICD-10-CM

## 2014-10-15 DIAGNOSIS — E039 Hypothyroidism, unspecified: Secondary | ICD-10-CM | POA: Diagnosis not present

## 2014-10-15 DIAGNOSIS — M24572 Contracture, left ankle: Secondary | ICD-10-CM | POA: Diagnosis not present

## 2014-10-15 DIAGNOSIS — F418 Other specified anxiety disorders: Secondary | ICD-10-CM | POA: Diagnosis not present

## 2014-10-15 DIAGNOSIS — E1141 Type 2 diabetes mellitus with diabetic mononeuropathy: Secondary | ICD-10-CM | POA: Diagnosis not present

## 2014-10-15 DIAGNOSIS — G8929 Other chronic pain: Secondary | ICD-10-CM | POA: Diagnosis not present

## 2014-10-15 DIAGNOSIS — G4700 Insomnia, unspecified: Secondary | ICD-10-CM

## 2014-10-15 DIAGNOSIS — E785 Hyperlipidemia, unspecified: Secondary | ICD-10-CM

## 2014-10-15 DIAGNOSIS — J449 Chronic obstructive pulmonary disease, unspecified: Secondary | ICD-10-CM | POA: Diagnosis not present

## 2014-10-15 DIAGNOSIS — I69954 Hemiplegia and hemiparesis following unspecified cerebrovascular disease affecting left non-dominant side: Secondary | ICD-10-CM | POA: Diagnosis not present

## 2014-10-15 DIAGNOSIS — E1165 Type 2 diabetes mellitus with hyperglycemia: Secondary | ICD-10-CM

## 2014-10-15 DIAGNOSIS — IMO0002 Reserved for concepts with insufficient information to code with codable children: Secondary | ICD-10-CM

## 2014-10-15 DIAGNOSIS — E1149 Type 2 diabetes mellitus with other diabetic neurological complication: Secondary | ICD-10-CM

## 2014-10-15 DIAGNOSIS — K5903 Drug induced constipation: Secondary | ICD-10-CM

## 2014-10-15 DIAGNOSIS — M216X9 Other acquired deformities of unspecified foot: Secondary | ICD-10-CM | POA: Diagnosis not present

## 2014-10-16 DIAGNOSIS — N390 Urinary tract infection, site not specified: Secondary | ICD-10-CM | POA: Diagnosis not present

## 2014-10-16 DIAGNOSIS — E119 Type 2 diabetes mellitus without complications: Secondary | ICD-10-CM | POA: Diagnosis not present

## 2014-10-16 DIAGNOSIS — I69954 Hemiplegia and hemiparesis following unspecified cerebrovascular disease affecting left non-dominant side: Secondary | ICD-10-CM | POA: Diagnosis not present

## 2014-10-16 DIAGNOSIS — M216X9 Other acquired deformities of unspecified foot: Secondary | ICD-10-CM | POA: Diagnosis not present

## 2014-10-16 DIAGNOSIS — M24572 Contracture, left ankle: Secondary | ICD-10-CM | POA: Diagnosis not present

## 2014-10-17 ENCOUNTER — Encounter: Payer: Self-pay | Admitting: Registered Nurse

## 2014-10-17 NOTE — Progress Notes (Signed)
Patient ID: Susan [name], female   DOB: 1951/07/11, 64 y.o.   MRN: [id number]   Place of Service: [facility]  Allergies  Allergen Reactions  . Niaspan [Niacin Er] Other (See Comments)    unknown  . Other     onions  . Valium [Diazepam] Other (See Comments)    unknown  . Wellbutrin [Bupropion] Other (See Comments)    unknown    Code Status: DNR  Goals of Care: Comfort and Quality of Life/LTC  Chief Complaint  Patient presents with  . Medical Management of Chronic Issues    DM2, chronic pain, old CVA, HLD, constipation, depression w/ anxiety, insomnia, copd    HPI 64 y.o. female with PMH of old cva s/p left hemiplegia, chronic pain, insomnia, depression with anxiety, uncontrolled DM2, HTN, hypothyroidism among others is being seen for a routine visit for management of her chronic issues. Weight stable. No recent fall or skin concerns reported. No change in behaviors or functional status reported. No concerns from nursing staff.  Chronic pain is being followed by the pain clinic. Hypothyroidism stable on levothyroxine. DM is poorly controlled with recent a1c 9.0. Per nursing staff, patient is noncompliant with dietary recommendations-eats sweets and snacks throughout the day. CBG range from 80-404. Up to date with eye and foot exam. Constipation is stable on linzess and colace. Mood stable on zoloft, remeron, and klonopin. HTN stable with BP range 120s/60-70s. No recent copd exacerbation. Seen in room today. Patient is complaining of bilateral ear pain L>R x 3 months. She was recently treated for acute sinusitis in December. Denies other complaints.    Review of Systems Constitutional: Positive for low-grade fever and chills HENT: Negative for congestion and sore throat. Positive for bil ear pain  Eyes: Negative for eye pain, eye discharge, and visual disturbance  Cardiovascular: Negative for chest pain, palpitations. Respiratory: Negative cough, shortness of breath, and  wheezing.  Gastrointestinal: Negative for nausea, vomiting, and abdominal pain.  Genitourinary: Negative for dysuria Musculoskeletal: Positive for generalized pain Neurological: Negative for  and headache and dizziness Skin: Negative for rash and wound.   Psychiatric: Positive for anxiety and depression. Negative for suicidal ideation.   Past Medical History  Diagnosis Date  . Hyperlipidemia   . Diabetes mellitus without complication   . COPD (chronic obstructive pulmonary disease)   . Depression   . Hypertension   . Thyroid disease   . Anemia   . CAD (coronary artery disease)   . Chronic pain   . Hemiplegia affecting non-dominant side, post-stroke   . Fall   . Urine retention   . Stroke     left side paralysis    Past Surgical History  Procedure Laterality Date  . Joint replacement      bil hip  . Abdominal hysterectomy    . Cholecystectomy    . Appendectomy    . Tonsillectomy      History   Social History  . Marital Status: Married    Spouse Name: N/A    Number of Children: N/A  . Years of Education: N/A   Occupational History  . Not on file.   Social History Main Topics  . Smoking status: Former Smoker    Types: Cigarettes  . Smokeless tobacco: Not on file  . Alcohol Use: No  . Drug Use: No  . Sexual Activity: Not on file   Other Topics Concern  . Not on file   Social History Narrative  Medication List       This list is accurate as of: 10/15/14 11:59 PM.  Always use your most recent med list.               albuterol 108 (90 BASE) MCG/ACT inhaler  Commonly known as:  PROVENTIL HFA;VENTOLIN HFA  Inhale 1 puff into the lungs every 6 (six) hours as needed for wheezing or shortness of breath.     baclofen 10 MG tablet  Commonly known as:  LIORESAL  Take 10 mg by mouth 3 (three) times daily.     clonazePAM 0.5 MG tablet  Commonly known as:  KLONOPIN  Take one tablet by mouth every 6 hours as needed for anxiety     cyclobenzaprine 5 MG  tablet  Commonly known as:  FLEXERIL  Take 5 mg by mouth 2 (two) times daily.     diclofenac sodium 1 % Gel  Commonly known as:  VOLTAREN  Apply 2 g topically at bedtime. To both feet     docusate sodium 100 MG capsule  Commonly known as:  COLACE  Take 2 capsules (200 mg total) by mouth 2 (two) times daily.     Fish Oil 1000 MG Caps  Take 2 capsules by mouth daily.     fluticasone 50 MCG/ACT nasal spray  Commonly known as:  FLONASE  Place 2 sprays into both nostrils daily.     furosemide 20 MG tablet  Commonly known as:  LASIX  Take 2 tablets (40 mg total) by mouth daily.     insulin detemir 100 UNIT/ML injection  Commonly known as:  LEVEMIR  Inject 30-70 Units into the skin 2 (two) times daily. 30 units in the morning and 70 units at bedtime     insulin lispro 100 UNIT/ML injection  Commonly known as:  HUMALOG  Inject 0.15-0.25 mLs (15-25 Units total) into the skin 3 (three) times daily before meals. 25 units prior to meals with an additional 15 units prior to meals for cbg >=150     isosorbide mononitrate 30 MG 24 hr tablet  Commonly known as:  IMDUR  Take 30 mg by mouth daily.     levothyroxine 25 MCG tablet  Commonly known as:  SYNTHROID, LEVOTHROID  Take 25 mcg by mouth daily before breakfast.     linagliptin 5 MG Tabs tablet  Commonly known as:  TRADJENTA  Take 5 mg by mouth daily.     LINZESS 290 MCG Caps capsule  Generic drug:  Linaclotide  Take 290 mcg by mouth daily.     Melatonin 3 MG Tabs  Take 6 mg by mouth at bedtime.     metFORMIN 500 MG tablet  Commonly known as:  GLUCOPHAGE  Take 1,000 mg by mouth 2 (two) times daily with a meal.     mirtazapine 30 MG tablet  Commonly known as:  REMERON  Take 30 mg by mouth at bedtime.     nitroGLYCERIN 0.4 MG SL tablet  Commonly known as:  NITROSTAT  Place 0.4 mg under the tongue every 5 (five) minutes as needed for chest pain.     oxyCODONE 15 MG immediate release tablet  Commonly known as:  ROXICODONE   Take one tablet by mouth 5 times daily at 5am,9am, 1pm,5pm 9pm for pain; Take one tablet by mouth as needed between 10pm-5am for breakthrough pain     pregabalin 100 MG capsule  Commonly known as:  LYRICA  Take one capsule by mouth every night  at bedtime for chronic neuropathy     promethazine 25 MG tablet  Commonly known as:  PHENERGAN  Take 25 mg by mouth every 6 (six) hours as needed for nausea or vomiting.     ranitidine 150 MG capsule  Commonly known as:  ZANTAC  Take 150 mg by mouth 2 (two) times daily as needed for heartburn.     rosuvastatin 40 MG tablet  Commonly known as:  CRESTOR  Take 40 mg by mouth every evening.     sertraline 50 MG tablet  Commonly known as:  ZOLOFT  Take 50 mg by mouth daily.     simethicone 80 MG chewable tablet  Commonly known as:  MYLICON  Chew 093 mg by mouth 3 (three) times daily.     spironolactone 25 MG tablet  Commonly known as:  ALDACTONE  Take 25 mg by mouth 2 (two) times daily.     TUDORZA PRESSAIR IN  Inhale 2 puffs into the lungs daily.        Physical Exam  BP 124/87 mmHg  Pulse 90  Temp(Src) 98.2 F (36.8 C)  Resp 14  Ht 5\' 3"  (1.6 m)  Wt 184 lb 14.4 oz (83.87 kg)  BMI 32.76 kg/m2  Constitutional: Obese adult female in no acute distress. Conversant and pleasant HEENT: Normocephalic and atraumatic. PERRL. EOM intact. Left TM ruptured with purulent drainage. Right TM intact cloudy yellow, mild cerumen in ear canal with some yellow drainage. Maxillary and frontal sinuses tender to palpation. Oral mucosa moist. Posterior pharynx clear of any exudate or lesions.  Neck: Supple and nontender. Submandibular lymphadenopathy w/o tenderness. No masses or thyromegaly. No JVD or carotid bruits. Cardiac: Normal S1, S2. RRR without appreciable murmurs, rubs, or gallops. Distal pulses intact. Trace edema of BLE Lungs: No respiratory distress. Breath sounds clear bilaterally without rales, rhonchi, or wheezes. Abdomen: Audible bowel  sounds in all quadrants. Soft today. Nontender and nondistended Musculoskeletal: Able to move right extremities. Left hemiplegia. Left foot drop with boot in place. Both feet tender to palpation Skin: Warm and dry. No rash noted. Neurological: Alert and oriented to person, place, and time.  Psychiatric: Judgment and insight adequate. Appropriate mood and affect.   Labs Reviewed  CBC Latest Ref Rng 08/30/2014 05/16/2014 04/17/2014  WBC 4.0 - 10.5 K/uL 8.5 6.7 8.1  Hemoglobin 12.0 - 15.0 g/dL 11.0(L) 11.2(L) 11.6(A)  Hematocrit 36.0 - 46.0 % 34.9(L) 36.0 38  Platelets 150 - 400 K/uL 264 264 269    CMP Latest Ref Rng 08/30/2014 08/17/2014 05/16/2014  Glucose 70 - 99 mg/dL 179(H) - 310(H)  BUN 6 - 23 mg/dL 17 17 24(H)  Creatinine 0.50 - 1.10 mg/dL 0.66 0.5 0.59  Sodium 137 - 147 mEq/L 135(L) 136(A) 137  Potassium 3.7 - 5.3 mEq/L 4.4 4.4 4.3  Chloride 96 - 112 mEq/L 95(L) - 94(L)  CO2 19 - 32 mEq/L 28 - 30  Calcium 8.4 - 10.5 mg/dL 8.8 - 9.1  Total Protein 6.0 - 8.3 g/dL - - 7.8  Total Bilirubin 0.3 - 1.2 mg/dL - - 0.2(L)  Alkaline Phos 39 - 117 U/L - - 96  AST 0 - 37 U/L - - 14  ALT 0 - 35 U/L - - 11    Lab Results  Component Value Date   HGBA1C 9.0* 09/16/2014    Lab Results  Component Value Date   TSH 2.50 09/16/2014    Lipid Panel     Component Value Date/Time   CHOL 191  08/13/2014   TRIG 144 08/13/2014   HDL 30* 08/13/2014   LDLCALC 132 08/13/2014    Assessment & Plan  1. Type II diabetes mellitus with neurological manifestations, uncontrolle Recent A1C 9.0. CBG range between 88-404. Continue metformin 1g twice daily, levemir 30 units daily in the morning and 70 units nightly, Humalog 25 units three times daily before meals with additional 15units for cbg >150, and trajenta 5mg  daily. Urine microalbumin normal . Foot exam and eye up to date. Stress importance of following dietary recommendations. Continue to monitor cbg and patient status. Continue statin   2.  Hypothyroidism Stable. TSH normal. Continue synthroid 11mcg daily and monitor.   3. Constipation Stable. Continue colace 200mg  twice daily and linzess 253mcg daily. Encourage hydration and continue to monitor.   4. Chronic obstructive pulmonary disease, unspecified COPD, unspecified chronic bronchitis type No recent exacerbations. Continue tudorza 2 puffs daily and albuterol every six as needed for shortness of breath and wheezing. Continue to monitor  5. Coronary artery disease involving native coronary artery of native heart without angina pectoris Remains chest pain. Continue imdur 30mg  daily and nitro prn for chest pain.  Continue statin. Continue to monitor  6. Essential hypertension, benign Stable. BP range 120s/60-70s. Continue lasix 40mg  daily and aldactone 25mg  twice daily.   7. Spastic hemiplegia affecting left side as late effect of cerebrovascular accident Stable. Continue baclofen three times daily and flexeril 5mg  twice daily for spasticity. Monitor clinically. Her chronic pain is being followed by pain clinic.   8. Insomnia Stable. Continue melatonin 6mg  nightly and remeron 30mg  nightly. Continue to monitor  9. Chronic pain On-going. Is followed by pain clinic. Continue lyrica 100mg  daily at bedtime, oxycodone 15mg  five times daily with additional 15mg  daily as needed between 10pm and 5am, flexeril 5mg  twice daily, baclofen 10mg  three time daily, and voltaren gel 2 g topically to both feet nightly.   10. Depression and anxiety Stable. Continue remeron 30mg  daily, zoloft 50mg  daily, and klonopin 0.5mg  every six hours as needed for anxiety. Continue psychotherapy and monitor for change in mood.  11. HLD LDL 132. Continue crestor 40mg  daily and fish oil 2g daily. Continue to monitor  12. Recurrent acute suppurative otitis media with spontaneous rupture of left tympanic  Start Augmentin 875/125mg  twice daily x 10days with florastor 250mg  twice daily x 2 weeks. ENT referral  for persistent ear pain and recurrent otitis media/sinusitis. Continue to monitor her status   Family/Staff Communication Plan of care discussed with resident and nursing staff. Resident and nursing staff verbalized understanding and agree with plan of care. No additional questions or concerns reported.    Arthur Holms, MSN, AGNP-C [facility] [address] [place], [place] [address] [phone] [8am-5pm] After hours: [phone]

## 2014-10-19 ENCOUNTER — Other Ambulatory Visit: Payer: Self-pay | Admitting: *Deleted

## 2014-10-19 DIAGNOSIS — M24572 Contracture, left ankle: Secondary | ICD-10-CM | POA: Diagnosis not present

## 2014-10-19 DIAGNOSIS — M216X9 Other acquired deformities of unspecified foot: Secondary | ICD-10-CM | POA: Diagnosis not present

## 2014-10-19 DIAGNOSIS — I69954 Hemiplegia and hemiparesis following unspecified cerebrovascular disease affecting left non-dominant side: Secondary | ICD-10-CM | POA: Diagnosis not present

## 2014-10-19 MED ORDER — PREGABALIN 100 MG PO CAPS: ORAL_CAPSULE | ORAL | Status: DC

## 2014-10-19 NOTE — Telephone Encounter (Signed)
[facility] 

## 2014-10-22 DIAGNOSIS — F39 Unspecified mood [affective] disorder: Secondary | ICD-10-CM | POA: Diagnosis not present

## 2014-10-22 DIAGNOSIS — F419 Anxiety disorder, unspecified: Secondary | ICD-10-CM | POA: Diagnosis not present

## 2014-10-27 DIAGNOSIS — F39 Unspecified mood [affective] disorder: Secondary | ICD-10-CM | POA: Diagnosis not present

## 2014-10-27 DIAGNOSIS — F419 Anxiety disorder, unspecified: Secondary | ICD-10-CM | POA: Diagnosis not present

## 2014-10-30 DIAGNOSIS — G4700 Insomnia, unspecified: Secondary | ICD-10-CM | POA: Diagnosis not present

## 2014-10-30 DIAGNOSIS — F329 Major depressive disorder, single episode, unspecified: Secondary | ICD-10-CM | POA: Diagnosis not present

## 2014-10-30 DIAGNOSIS — F411 Generalized anxiety disorder: Secondary | ICD-10-CM | POA: Diagnosis not present

## 2014-11-03 DIAGNOSIS — F39 Unspecified mood [affective] disorder: Secondary | ICD-10-CM | POA: Diagnosis not present

## 2014-11-03 DIAGNOSIS — F419 Anxiety disorder, unspecified: Secondary | ICD-10-CM | POA: Diagnosis not present

## 2014-11-08 DIAGNOSIS — Z1212 Encounter for screening for malignant neoplasm of rectum: Secondary | ICD-10-CM | POA: Diagnosis not present

## 2014-11-08 DIAGNOSIS — Z1211 Encounter for screening for malignant neoplasm of colon: Secondary | ICD-10-CM | POA: Diagnosis not present

## 2014-11-08 LAB — COLOGUARD

## 2014-11-09 DIAGNOSIS — M797 Fibromyalgia: Secondary | ICD-10-CM | POA: Diagnosis not present

## 2014-11-09 DIAGNOSIS — Z79899 Other long term (current) drug therapy: Secondary | ICD-10-CM | POA: Diagnosis not present

## 2014-11-09 DIAGNOSIS — G894 Chronic pain syndrome: Secondary | ICD-10-CM | POA: Diagnosis not present

## 2014-11-09 DIAGNOSIS — G629 Polyneuropathy, unspecified: Secondary | ICD-10-CM | POA: Diagnosis not present

## 2014-11-10 DIAGNOSIS — H6123 Impacted cerumen, bilateral: Secondary | ICD-10-CM | POA: Diagnosis not present

## 2014-11-10 DIAGNOSIS — J323 Chronic sphenoidal sinusitis: Secondary | ICD-10-CM | POA: Diagnosis not present

## 2014-11-10 DIAGNOSIS — R51 Headache: Secondary | ICD-10-CM | POA: Diagnosis not present

## 2014-11-12 DIAGNOSIS — F419 Anxiety disorder, unspecified: Secondary | ICD-10-CM | POA: Diagnosis not present

## 2014-11-12 DIAGNOSIS — F39 Unspecified mood [affective] disorder: Secondary | ICD-10-CM | POA: Diagnosis not present

## 2014-11-13 ENCOUNTER — Non-Acute Institutional Stay (SKILLED_NURSING_FACILITY): Payer: Medicare Other | Admitting: Registered Nurse

## 2014-11-13 DIAGNOSIS — K5909 Other constipation: Secondary | ICD-10-CM | POA: Diagnosis not present

## 2014-11-13 DIAGNOSIS — J449 Chronic obstructive pulmonary disease, unspecified: Secondary | ICD-10-CM | POA: Diagnosis not present

## 2014-11-13 DIAGNOSIS — E1141 Type 2 diabetes mellitus with diabetic mononeuropathy: Secondary | ICD-10-CM | POA: Diagnosis not present

## 2014-11-13 DIAGNOSIS — G8114 Spastic hemiplegia affecting left nondominant side: Secondary | ICD-10-CM | POA: Diagnosis not present

## 2014-11-13 DIAGNOSIS — J322 Chronic ethmoidal sinusitis: Secondary | ICD-10-CM | POA: Diagnosis not present

## 2014-11-13 DIAGNOSIS — G4700 Insomnia, unspecified: Secondary | ICD-10-CM

## 2014-11-13 DIAGNOSIS — F418 Other specified anxiety disorders: Secondary | ICD-10-CM | POA: Diagnosis not present

## 2014-11-13 DIAGNOSIS — E039 Hypothyroidism, unspecified: Secondary | ICD-10-CM | POA: Diagnosis not present

## 2014-11-13 DIAGNOSIS — I10 Essential (primary) hypertension: Secondary | ICD-10-CM

## 2014-11-13 DIAGNOSIS — IMO0002 Reserved for concepts with insufficient information to code with codable children: Secondary | ICD-10-CM

## 2014-11-13 DIAGNOSIS — K5903 Drug induced constipation: Secondary | ICD-10-CM

## 2014-11-13 DIAGNOSIS — I2510 Atherosclerotic heart disease of native coronary artery without angina pectoris: Secondary | ICD-10-CM | POA: Diagnosis not present

## 2014-11-13 DIAGNOSIS — G8929 Other chronic pain: Secondary | ICD-10-CM

## 2014-11-13 DIAGNOSIS — E1149 Type 2 diabetes mellitus with other diabetic neurological complication: Secondary | ICD-10-CM

## 2014-11-13 DIAGNOSIS — E785 Hyperlipidemia, unspecified: Secondary | ICD-10-CM | POA: Diagnosis not present

## 2014-11-13 DIAGNOSIS — R635 Abnormal weight gain: Secondary | ICD-10-CM

## 2014-11-13 DIAGNOSIS — E1165 Type 2 diabetes mellitus with hyperglycemia: Principal | ICD-10-CM

## 2014-11-15 ENCOUNTER — Encounter: Payer: Self-pay | Admitting: Registered Nurse

## 2014-11-15 NOTE — Progress Notes (Signed)
Patient ID: Susan [name], female   DOB: Oct 20, 1950, 65 y.o.   MRN: [id number]   Place of Service: [facility]  Allergies  Allergen Reactions  . Niaspan [Niacin Er] Other (See Comments)    unknown  . Other     onions  . Valium [Diazepam] Other (See Comments)    unknown  . Wellbutrin [Bupropion] Other (See Comments)    unknown    Code Status: DNR  Goals of Care: Comfort and Quality of Life/LTC  Chief Complaint  Patient presents with  . Medical Management of Chronic Issues    DM2, COPD, chronic pain, insomnia, depression/anxiety, hypothyroidism, constipation, HTN, CAD, spastic hemiplegia    HPI 64 y.o. female with PMH of old cva s/p left hemiplegia, chronic pain, insomnia, depression with anxiety, uncontrolled DM2, HTN, hypothyroidism among others is being seen for a routine visit for management of her chronic issues. Has 6lbs weight gain over the past 30 days. No recent fall or skin concerns reported. No change in behaviors or functional status reported. No concerns from nursing staff. DM2 is uncontrolled with CBG range 102-400s, with most reading in 250s-300s. Resident remains noncompliant with dietary recommendations-eats sweets and high-carb snacks throughout the day. Chronic pain is being followed by the pain clinic. Hypothyroidism stable on levothyroxine.  Constipation is stable on linzess and colace. Mood stable on zoloft and klonopin. HTN stable with BP range 120-130s/60-70s. COPD stable w/o recent copd exacerbation. Is currently on Augmentin and flonase x 30 days for chronic ethmoidal sinusitis per ENT. Seen in room today. Denies any concerns. Stated that she does not eat sweets or snacks at all. The box of snacks in the room is mostly her roommate or when she does not like the food being served.   Review of Systems Constitutional: Negative for fever and chills HENT: Negative for congestion and sore throat.  Eyes: Negative for eye pain, eye discharge, and visual  disturbance  Cardiovascular: Negative for chest pain, palpitations. Respiratory: Negative cough, shortness of breath, and wheezing.  Gastrointestinal: Negative for nausea, vomiting, and abdominal pain.  Genitourinary: Negative for dysuria Musculoskeletal: Positive for generalized pain Neurological: Negative for  and headache and dizziness Skin: Negative for rash and wound.   Psychiatric: Positive for anxiety and depression. Negative for suicidal ideation.   Past Medical History  Diagnosis Date  . Hyperlipidemia   . Diabetes mellitus without complication   . COPD (chronic obstructive pulmonary disease)   . Depression   . Hypertension   . Thyroid disease   . Anemia   . CAD (coronary artery disease)   . Chronic pain   . Hemiplegia affecting non-dominant side, post-stroke   . Fall   . Urine retention   . Stroke     left side paralysis    Past Surgical History  Procedure Laterality Date  . Joint replacement      bil hip  . Abdominal hysterectomy    . Cholecystectomy    . Appendectomy    . Tonsillectomy      History   Social History  . Marital Status: Married    Spouse Name: N/A  . Number of Children: N/A  . Years of Education: N/A   Occupational History  . Not on file.   Social History Main Topics  . Smoking status: Former Smoker    Types: Cigarettes  . Smokeless tobacco: Not on file  . Alcohol Use: No  . Drug Use: No  . Sexual Activity: Not on file  Other Topics Concern  . Not on file   Social History Narrative      Medication List       This list is accurate as of: 11/13/14 11:59 PM.  Always use your most recent med list.               albuterol 108 (90 BASE) MCG/ACT inhaler  Commonly known as:  PROVENTIL HFA;VENTOLIN HFA  Inhale 1 puff into the lungs every 6 (six) hours as needed for wheezing or shortness of breath.     baclofen 10 MG tablet  Commonly known as:  LIORESAL  Take 10 mg by mouth 3 (three) times daily.     clonazePAM 0.5 MG  tablet  Commonly known as:  KLONOPIN  Take one tablet by mouth every 6 hours as needed for anxiety     cyclobenzaprine 5 MG tablet  Commonly known as:  FLEXERIL  Take 5 mg by mouth 2 (two) times daily.     diclofenac sodium 1 % Gel  Commonly known as:  VOLTAREN  Apply 2 g topically at bedtime. To both feet     docusate sodium 100 MG capsule  Commonly known as:  COLACE  Take 2 capsules (200 mg total) by mouth 2 (two) times daily.     Fish Oil 1000 MG Caps  Take 2 capsules by mouth daily.     fluticasone 50 MCG/ACT nasal spray  Commonly known as:  FLONASE  Place 2 sprays into both nostrils daily.     furosemide 20 MG tablet  Commonly known as:  LASIX  Take 2 tablets (40 mg total) by mouth daily.     insulin detemir 100 UNIT/ML injection  Commonly known as:  LEVEMIR  Inject 30-70 Units into the skin 2 (two) times daily. 30 units in the morning and 70 units at bedtime     insulin lispro 100 UNIT/ML injection  Commonly known as:  HUMALOG  Inject 0.15-0.25 mLs (15-25 Units total) into the skin 3 (three) times daily before meals. 25 units prior to meals with an additional 15 units prior to meals for cbg >=150     isosorbide mononitrate 30 MG 24 hr tablet  Commonly known as:  IMDUR  Take 30 mg by mouth daily.     levothyroxine 25 MCG tablet  Commonly known as:  SYNTHROID, LEVOTHROID  Take 25 mcg by mouth daily before breakfast.     linagliptin 5 MG Tabs tablet  Commonly known as:  TRADJENTA  Take 5 mg by mouth daily.     LINZESS 290 MCG Caps capsule  Generic drug:  Linaclotide  Take 290 mcg by mouth daily.     Melatonin 3 MG Tabs  Take 6 mg by mouth at bedtime.     metFORMIN 500 MG tablet  Commonly known as:  GLUCOPHAGE  Take 1,000 mg by mouth 2 (two) times daily with a meal.     mirtazapine 30 MG tablet  Commonly known as:  REMERON  Take 30 mg by mouth at bedtime.     nitroGLYCERIN 0.4 MG SL tablet  Commonly known as:  NITROSTAT  Place 0.4 mg under the tongue  every 5 (five) minutes as needed for chest pain.     oxyCODONE 15 MG immediate release tablet  Commonly known as:  ROXICODONE  Take one tablet by mouth 5 times daily at 5am,9am, 1pm,5pm 9pm for pain; Take one tablet by mouth as needed between 10pm-5am for breakthrough pain  pregabalin 100 MG capsule  Commonly known as:  LYRICA  Take one capsule by mouth every night at bedtime for chronic neuropathy     promethazine 25 MG tablet  Commonly known as:  PHENERGAN  Take 25 mg by mouth every 6 (six) hours as needed for nausea or vomiting.     ranitidine 150 MG capsule  Commonly known as:  ZANTAC  Take 150 mg by mouth 2 (two) times daily as needed for heartburn.     rosuvastatin 40 MG tablet  Commonly known as:  CRESTOR  Take 40 mg by mouth every evening.     sertraline 50 MG tablet  Commonly known as:  ZOLOFT  Take 50 mg by mouth daily.     simethicone 80 MG chewable tablet  Commonly known as:  MYLICON  Chew 081 mg by mouth 3 (three) times daily.     spironolactone 25 MG tablet  Commonly known as:  ALDACTONE  Take 25 mg by mouth 2 (two) times daily.     SYSTANE ULTRA OP  Place 1 drop into both eyes daily. And as needed     tiotropium 18 MCG inhalation capsule  Commonly known as:  SPIRIVA  Place 18 mcg into inhaler and inhale daily.        Physical Exam  BP 136/64 mmHg  Pulse 63  Temp(Src) 98 F (36.7 C)  Resp 18  Ht 5\' 3"  (1.6 m)  Wt 190 lb 12.8 oz (86.546 kg)  BMI 33.81 kg/m2  SpO2 95%  Constitutional: Obese adult female in no acute distress. Conversant and pleasant HEENT: Normocephalic and atraumatic. PERRL. EOM intact. Ethmoid and frontal sinuses tender to palpation. Oral mucosa moist. Posterior pharynx clear of any exudate or lesions.  Neck: Supple and nontender. Submandibular lymphadenopathy w/o tenderness. No masses or thyromegaly. No JVD or carotid bruits. Cardiac: Normal S1, S2. RRR without appreciable murmurs, rubs, or gallops. Distal pulses intact.  Trace edema of BLE Lungs: No respiratory distress. Breath sounds clear bilaterally without rales, rhonchi, or wheezes. Abdomen: Audible bowel sounds in all quadrants. Firm, distended. Nontender to palpation Musculoskeletal: Able to move right extremities. Left hemiplegia. Left foot drop. Both feet tender to palpation Skin: Warm and dry. No rash noted. Neurological: Alert and oriented to person, place, and time.  Psychiatric: Judgment and insight adequate. Appropriate mood and affect.   Labs Reviewed  CBC Latest Ref Rng 08/30/2014 05/16/2014 04/17/2014  WBC 4.0 - 10.5 K/uL 8.5 6.7 8.1  Hemoglobin 12.0 - 15.0 g/dL 11.0(L) 11.2(L) 11.6(A)  Hematocrit 36.0 - 46.0 % 34.9(L) 36.0 38  Platelets 150 - 400 K/uL 264 264 269    CMP Latest Ref Rng 08/30/2014 08/17/2014 05/16/2014  Glucose 70 - 99 mg/dL 179(H) - 310(H)  BUN 6 - 23 mg/dL 17 17 24(H)  Creatinine 0.50 - 1.10 mg/dL 0.66 0.5 0.59  Sodium 137 - 147 mEq/L 135(L) 136(A) 137  Potassium 3.7 - 5.3 mEq/L 4.4 4.4 4.3  Chloride 96 - 112 mEq/L 95(L) - 94(L)  CO2 19 - 32 mEq/L 28 - 30  Calcium 8.4 - 10.5 mg/dL 8.8 - 9.1  Total Protein 6.0 - 8.3 g/dL - - 7.8  Total Bilirubin 0.3 - 1.2 mg/dL - - 0.2(L)  Alkaline Phos 39 - 117 U/L - - 96  AST 0 - 37 U/L - - 14  ALT 0 - 35 U/L - - 11    Lab Results  Component Value Date   HGBA1C 9.0* 09/16/2014    Lab Results  Component Value Date   TSH 2.50 09/16/2014    Lipid Panel     Component Value Date/Time   CHOL 191 08/13/2014   TRIG 144 08/13/2014   HDL 30* 08/13/2014   LDLCALC 132 08/13/2014    Assessment & Plan  1. Type II diabetes mellitus with neurological manifestations, uncontrolle Recent A1C 9.0. CBG range between 102-400s. Continue metformin 1g twice daily, levemir 30 units daily in the morning and 70 units nightly, Humalog 25 units three times daily before meals with additional 15units for cbg >150, and trajenta 5mg  daily. Start glimeperide 2mg  daily. Urine microalbumin normal . Foot  exam and eye up to date. Stress importance of following dietary recommendations. Continue to monitor cbg and patient status. Continue statin   2. Hypothyroidism Stable. Continue synthroid 43mcg daily.   3. Constipation Stable. Continue colace 200mg  twice daily and linzess 266mcg daily. Encourage hydration and OOB as tolerated.    4. Chronic obstructive pulmonary disease, unspecified COPD, unspecified chronic bronchitis type No recent exacerbations. Continue spiriva 61mcg daily and albuterol every six as needed for shortness of breath and wheezing. Continue to monitor  5. Coronary artery disease involving native coronary artery of native heart without angina pectoris Denies any chest pain. Continue imdur 30mg  daily and nitro prn for chest pain. Continue statin. Monitor clinically  6. Essential hypertension, benign Stable. BP range 120-130s/60-70s. Continue lasix 40mg  daily and aldactone 25mg  twice daily. Continue to monitor  7. Spastic hemiplegia affecting left side as late effect of cerebrovascular accident Stable. Continue baclofen three times daily and flexeril 5mg  twice daily for spasticity.   8. Insomnia Stable. Continue melatonin 6mg  nightly and remeron 30mg  nightly. Continue to monitor  9. Chronic pain On-going. Is followed by pain clinic. Continue lyrica 100mg  daily at bedtime, oxycodone 15mg  five times daily with additional 15mg  daily as needed between 10pm and 5am, flexeril 5mg  twice daily, baclofen 10mg  three time daily, and voltaren gel 2 g topically to both feet nightly.   10. Depression and anxiety Stable. Continue remeron 30mg  daily, zoloft 50mg  daily, and klonopin 0.5mg  every six hours as needed for anxiety. Continue psychotherapy and monitor for change in mood.  11. HLD LDL 132. Continue crestor 40mg  daily and fish oil 2g daily.  12. Chronic ethmoidal sinusitis Is being followed by ENT. Continue and complete Augmentin 875/125mg  twice daily with flonase nasal spray x  30days. Continue to monitor her status  13. Weight gain Secondary to lack of physical activities and poor dietary compliance. Education provided regarding importance of lifestyle modification in optimizing glucose control and weight loss.    Family/Staff Communication Plan of care discussed with resident and nursing staff. Resident and nursing staff verbalized understanding and agree with plan of care. No additional questions or concerns reported.    Arthur Holms, MSN, AGNP-C [facility] [address] [place], [place] [address] [phone] [8am-5pm] After hours: [phone]

## 2014-11-17 DIAGNOSIS — F419 Anxiety disorder, unspecified: Secondary | ICD-10-CM | POA: Diagnosis not present

## 2014-11-17 DIAGNOSIS — F39 Unspecified mood [affective] disorder: Secondary | ICD-10-CM | POA: Diagnosis not present

## 2014-11-24 ENCOUNTER — Ambulatory Visit: Payer: Medicare Other | Admitting: Podiatry

## 2014-11-26 DIAGNOSIS — F419 Anxiety disorder, unspecified: Secondary | ICD-10-CM | POA: Diagnosis not present

## 2014-11-26 DIAGNOSIS — F39 Unspecified mood [affective] disorder: Secondary | ICD-10-CM | POA: Diagnosis not present

## 2014-12-08 DIAGNOSIS — F419 Anxiety disorder, unspecified: Secondary | ICD-10-CM | POA: Diagnosis not present

## 2014-12-08 DIAGNOSIS — F39 Unspecified mood [affective] disorder: Secondary | ICD-10-CM | POA: Diagnosis not present

## 2014-12-14 ENCOUNTER — Non-Acute Institutional Stay (SKILLED_NURSING_FACILITY): Payer: Medicare Other | Admitting: Internal Medicine

## 2014-12-14 DIAGNOSIS — E1165 Type 2 diabetes mellitus with hyperglycemia: Secondary | ICD-10-CM

## 2014-12-14 DIAGNOSIS — R21 Rash and other nonspecific skin eruption: Secondary | ICD-10-CM | POA: Diagnosis not present

## 2014-12-14 DIAGNOSIS — F418 Other specified anxiety disorders: Secondary | ICD-10-CM

## 2014-12-14 DIAGNOSIS — K5900 Constipation, unspecified: Secondary | ICD-10-CM

## 2014-12-14 DIAGNOSIS — E039 Hypothyroidism, unspecified: Secondary | ICD-10-CM | POA: Diagnosis not present

## 2014-12-14 DIAGNOSIS — E785 Hyperlipidemia, unspecified: Secondary | ICD-10-CM | POA: Insufficient documentation

## 2014-12-14 DIAGNOSIS — I509 Heart failure, unspecified: Secondary | ICD-10-CM

## 2014-12-14 DIAGNOSIS — IMO0002 Reserved for concepts with insufficient information to code with codable children: Secondary | ICD-10-CM | POA: Insufficient documentation

## 2014-12-14 DIAGNOSIS — I110 Hypertensive heart disease with heart failure: Secondary | ICD-10-CM

## 2014-12-14 DIAGNOSIS — K5904 Chronic idiopathic constipation: Secondary | ICD-10-CM

## 2014-12-14 NOTE — Progress Notes (Signed)
Patient ID: Susan [name], female   DOB: 1951-01-03, 64 y.o.   MRN: [id number]    Facility: [facility]   Chief Complaint  Patient presents with  . Medical Management of Chronic Issues   Allergies  Allergen Reactions  . Niaspan [Niacin Er] Other (See Comments)    unknown  . Other     onions  . Valium [Diazepam] Other (See Comments)    unknown  . Wellbutrin [Bupropion] Other (See Comments)    unknown   Code status: DNR  HPI 64 y/o female patient is seen for routine visit. 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 She complaints of itching in her back and abdominal area. Denies any other concerns. Her cbg have been better since starting of amaryl. cbg reviewed [phone] with a couple of 300-320s reading. No reading in 400s like before. No hypoglycemic episodes. bp reading reviewed, stable. Weight on review in 190.8 lb, no recent weight check She is followed by pain clinic.   Review of Systems  Constitutional: Negative for fever, chills, diaphoresis.  HENT: Negative for congestion, hearing loss and sore throat.   Eyes: Negative for blurred vision, double vision and discharge.  Respiratory: Negative for cough, sputum production, shortness of breath and wheezing.   Cardiovascular: Negative for chest pain, palpitations,leg swelling.  Gastrointestinal: Negative for heartburn, nausea, vomiting, abdominal pain. Has chronic constipation.  Genitourinary: Negative for dysuria and flank pain.  Musculoskeletal: Negative for falls Skin: Negative forrash.  Neurological: Negative for dizziness, tingling, headaches.  Psychiatric/Behavioral: Negative for depression.  Past Medical History  Diagnosis Date  . Hyperlipidemia   . Diabetes mellitus without complication   . COPD (chronic obstructive pulmonary disease)   . Depression   . Hypertension   . Thyroid disease   . Anemia    . CAD (coronary artery disease)   . Chronic pain   . Hemiplegia affecting non-dominant side, post-stroke   . Fall   . Urine retention   . Stroke     left side paralysis     Medication List       This list is accurate as of: 12/14/14 11:37 AM.  Always use your most recent med list.               albuterol 108 (90 BASE) MCG/ACT inhaler  Commonly known as:  PROVENTIL HFA;VENTOLIN HFA  Inhale 1 puff into the lungs every 6 (six) hours as needed for wheezing or shortness of breath.     baclofen 10 MG tablet  Commonly known as:  LIORESAL  Take 10 mg by mouth 3 (three) times daily.     clonazePAM 0.5 MG tablet  Commonly known as:  KLONOPIN  Take one tablet by mouth every 6 hours as needed for anxiety     cyclobenzaprine 5 MG tablet  Commonly known as:  FLEXERIL  Take 5 mg by mouth 2 (two) times daily.     diclofenac sodium 1 % Gel  Commonly known as:  VOLTAREN  Apply 2 g topically at bedtime. To both feet     docusate sodium 100 MG capsule  Commonly known as:  COLACE  Take 2 capsules (200 mg total) by mouth 2 (two) times daily.     Fish Oil 1000 MG Caps  Take 2 capsules by mouth daily.     fluticasone 50 MCG/ACT nasal spray  Commonly known as:  [place]ledge  2 sprays into both nostrils daily.     furosemide 20 MG tablet  Commonly known as:  LASIX  Take 2 tablets (40 mg total) by mouth daily.     glimepiride 2 MG tablet  Commonly known as:  AMARYL  Take 2 mg by mouth daily with breakfast.     insulin detemir 100 UNIT/ML injection  Commonly known as:  LEVEMIR  Inject 30-70 Units into the skin 2 (two) times daily. 30 units in the morning and 70 units at bedtime     insulin lispro 100 UNIT/ML injection  Commonly known as:  HUMALOG  Inject 0.15-0.25 mLs (15-25 Units total) into the skin 3 (three) times daily before meals. 25 units prior to meals with an additional 15 units prior to meals for cbg >=150     isosorbide mononitrate 30 MG 24 hr tablet  Commonly known  as:  IMDUR  Take 30 mg by mouth daily.     levothyroxine 25 MCG tablet  Commonly known as:  SYNTHROID, LEVOTHROID  Take 25 mcg by mouth daily before breakfast.     linagliptin 5 MG Tabs tablet  Commonly known as:  TRADJENTA  Take 5 mg by mouth daily.     LINZESS 290 MCG Caps capsule  Generic drug:  Linaclotide  Take 290 mcg by mouth daily.     Melatonin 3 MG Tabs  Take 6 mg by mouth at bedtime.     metFORMIN 500 MG tablet  Commonly known as:  GLUCOPHAGE  Take 1,000 mg by mouth 2 (two) times daily with a meal.     mirtazapine 30 MG tablet  Commonly known as:  REMERON  Take 30 mg by mouth at bedtime.     nitroGLYCERIN 0.4 MG SL tablet  Commonly known as:  NITROSTAT  Place 0.4 mg under the tongue every 5 (five) minutes as needed for chest pain.     oxyCODONE 15 MG immediate release tablet  Commonly known as:  ROXICODONE  Take one tablet by mouth 5 times daily at 5am,9am, 1pm,5pm 9pm for pain; Take one tablet by mouth as needed between 10pm-5am for breakthrough pain     pregabalin 100 MG capsule  Commonly known as:  LYRICA  Take one capsule by mouth every night at bedtime for chronic neuropathy     promethazine 25 MG tablet  Commonly known as:  PHENERGAN  Take 25 mg by mouth every 6 (six) hours as needed for nausea or vomiting.     ranitidine 150 MG capsule  Commonly known as:  ZANTAC  Take 150 mg by mouth 2 (two) times daily as needed for heartburn.     rosuvastatin 40 MG tablet  Commonly known as:  CRESTOR  Take 40 mg by mouth every evening.     sertraline 50 MG tablet  Commonly known as:  ZOLOFT  Take 50 mg by mouth daily.     simethicone 80 MG chewable tablet  Commonly known as:  MYLICON  Chew 628 mg by mouth 3 (three) times daily.     spironolactone 25 MG tablet  Commonly known as:  ALDACTONE  Take 25 mg by mouth 2 (two) times daily.     SYSTANE ULTRA OP  Place 1 drop into both eyes daily. And as needed     tiotropium 18 MCG inhalation capsule    Commonly known as:  SPIRIVA  Place 18 mcg into inhaler and inhale daily.       Physical exam BP 149/67 mmHg  Pulse 89  Temp(Src) 96.8 F (36 C)  Resp 18  Wt 190 lb 12.8 oz (86.546 kg)  SpO2 96%  Wt Readings from Last 3 Encounters:  12/14/14 190 lb 12.8 oz (86.546 kg)  11/13/14 190 lb 12.8 oz (86.546 kg)  10/15/14 184 lb 14.4 oz (83.87 kg)   General- elderly obese female in no acute distress Head- atraumatic, normocephalic Eyes- no pallor, no icterus, no discharge Neck- no lymphadenopathy Mouth- normal mucus membrane, upper and lower dentures Cardiovascular- normal s1,s2, no murmurs, normal distal pulses, no leg edema Respiratory- bilateral clear to auscultation, no wheeze, no rhonchi, no crackles Abdomen- bowel sounds present, firm, distended, non tender, no guarding or rigidity, no CVA tenderness Musculoskeletal- able to move right sided extremities, left sided hemiplegia with left foot drop, no leg edema Neurological- no focal deficit Skin- warm and dry, redness on the back is generalized, scratch marks on left side of abdominal wall Psychiatry- alert and oriented to person, place and time, normal mood and affect    Labs  CBC Latest Ref Rng 08/30/2014 05/16/2014 04/17/2014  WBC 4.0 - 10.5 K/uL 8.5 6.7 8.1  Hemoglobin 12.0 - 15.0 g/dL 11.0(L) 11.2(L) 11.6(A)  Hematocrit 36.0 - 46.0 % 34.9(L) 36.0 38  Platelets 150 - 400 K/uL 264 264 269   CMP Latest Ref Rng 08/30/2014 08/17/2014 05/16/2014  Glucose 70 - 99 mg/dL 179(H) - 310(H)  BUN 6 - 23 mg/dL 17 17 24(H)  Creatinine 0.50 - 1.10 mg/dL 0.66 0.5 0.59  Sodium 137 - 147 mEq/L 135(L) 136(A) 137  Potassium 3.7 - 5.3 mEq/L 4.4 4.4 4.3  Chloride 96 - 112 mEq/L 95(L) - 94(L)  CO2 19 - 32 mEq/L 28 - 30  Calcium 8.4 - 10.5 mg/dL 8.8 - 9.1  Total Protein 6.0 - 8.3 g/dL - - 7.8  Total Bilirubin 0.3 - 1.2 mg/dL - - 0.2(L)  Alkaline Phos 39 - 117 U/L - - 96  AST 0 - 37 U/L - - 14  ALT 0 - 35 U/L - - 11   Lab Results  Component  Value Date   HGBA1C 9.0* 09/16/2014   Lab Results  Component Value Date   TSH 2.50 09/16/2014   08/12/14 urine microalbumin/cr 12.6  Assessment/plan  Type II diabetes mellitus Last a1c 9. cbg reading improved. Check a1c. Continue metformin 1000 mg bid, levemir 30 u am and 70 u pm, humalog 25 u with meals tid, tradjenta 5 mg daily and amaryl 2 mg daily. Change additional coverage from 15 u for cbg > 150 to 5 u for cbg 150-200, 8 u for 200-250 and 10 u for 250-300. Reassess. No microalbuminuria. uptodate with foot and eye exam. Continue statin. Add aspirin 81 mg daily for now  Rash Appears to be contact dermatitis on her back with her in bed and moisture. Frequent change of position and atarax 25 mg q8h prn for itching for 5 days and reassess if needed  Constipation Stable. Continue colace 200mg  twice daily. No known hx of IBS. Change linzess to 149 mcg daily  Hypertensive heart disease Mildly elevated this am. On review SBP mostly in 110-128 range and DBP normal.  On imdur 30mg  daily, lasix 40mg  daily and aldactone 25mg  twice daily. Continue current regimen and monitor bp reading. Add aspirin for carioprotective effect  Hypothyroidism Stable. Continue synthroid 23mcg daily. Check tsh every 6 month  Depression and anxiety Continue trazodone and klonopin. D/c prn remeron   Spastic hemiplegia affecting left side as late effect of  cerebrovascular accident Stable. Continue baclofen and flexeril   HLD LDL 132. Continue crestor 40mg  daily and fish oil 2g daily. Monitor lipid panel q6 months  Labs- a1c next lab  Family/ staff Communication: reviewed care plan with patient and nursing supervisor  Blanchie Serve, MD  [facility] [phone] (Monday-Friday 8 am - 5 pm) [phone] (afterhours)

## 2014-12-14 NOTE — Progress Notes (Deleted)
Patient ID: Susan [name], female   DOB: 1950/12/10, 64 y.o.   MRN: [id number]    Facility: [facility]   Chief Complaint  Patient presents with  . Medical Management of Chronic Issues   Allergies  Allergen Reactions  . Niaspan [Niacin Er] Other (See Comments)    unknown  . Other     onions  . Valium [Diazepam] Other (See Comments)    unknown  . Wellbutrin [Bupropion] Other (See Comments)    unknown   Code status: DNR  HPI 64 y/o female patient is seen for routine visit. 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 She complaints of itching in her back and abdominal area. Denies any other concerns. She is followed by pain clinic.   Review of Systems  Constitutional: Negative for fever, chills, diaphoresis.  HENT: Negative for congestion, hearing loss and sore throat.   Eyes: Negative for blurred vision, double vision and discharge.  Respiratory: Negative for cough, sputum production, shortness of breath and wheezing.   Cardiovascular: Negative for chest pain, palpitations,leg swelling.  Gastrointestinal: Negative for heartburn, nausea, vomiting, abdominal pain. Has chronic constipation.  Genitourinary: Negative for dysuria and flank pain.  Musculoskeletal: Negative for falls Skin: Negative forrash.  Neurological: Negative for dizziness, tingling, headaches.  Psychiatric/Behavioral: Negative for depression.  Past Medical History  Diagnosis Date  . Hyperlipidemia   . Diabetes mellitus without complication   . COPD (chronic obstructive pulmonary disease)   . Depression   . Hypertension   . Thyroid disease   . Anemia   . CAD (coronary artery disease)   . Chronic pain   . Hemiplegia affecting non-dominant side, post-stroke   . Fall   . Urine retention   . Stroke     left side paralysis     Medication List       This list is accurate as of: 12/14/14  10:34 AM.  Always use your most recent med list.               albuterol 108 (90 BASE) MCG/ACT inhaler  Commonly known as:  PROVENTIL HFA;VENTOLIN HFA  Inhale 1 puff into the lungs every 6 (six) hours as needed for wheezing or shortness of breath.     baclofen 10 MG tablet  Commonly known as:  LIORESAL  Take 10 mg by mouth 3 (three) times daily.     clonazePAM 0.5 MG tablet  Commonly known as:  KLONOPIN  Take one tablet by mouth every 6 hours as needed for anxiety     cyclobenzaprine 5 MG tablet  Commonly known as:  FLEXERIL  Take 5 mg by mouth 2 (two) times daily.     diclofenac sodium 1 % Gel  Commonly known as:  VOLTAREN  Apply 2 g topically at bedtime. To both feet     docusate sodium 100 MG capsule  Commonly known as:  COLACE  Take 2 capsules (200 mg total) by mouth 2 (two) times daily.     Fish Oil 1000 MG Caps  Take 2 capsules by mouth daily.     fluticasone 50 MCG/ACT nasal spray  Commonly known as:  FLONASE  Place 2 sprays into both nostrils daily.     furosemide 20 MG tablet  Commonly known as:  LASIX  Take 2 tablets (40 mg total) by mouth daily.     glimepiride 2 MG tablet  Commonly known  as:  AMARYL  Take 2 mg by mouth daily with breakfast.     insulin detemir 100 UNIT/ML injection  Commonly known as:  LEVEMIR  Inject 30-70 Units into the skin 2 (two) times daily. 30 units in the morning and 70 units at bedtime     insulin lispro 100 UNIT/ML injection  Commonly known as:  HUMALOG  Inject 0.15-0.25 mLs (15-25 Units total) into the skin 3 (three) times daily before meals. 25 units prior to meals with an additional 15 units prior to meals for cbg >=150     isosorbide mononitrate 30 MG 24 hr tablet  Commonly known as:  IMDUR  Take 30 mg by mouth daily.     levothyroxine 25 MCG tablet  Commonly known as:  SYNTHROID, LEVOTHROID  Take 25 mcg by mouth daily before breakfast.     linagliptin 5 MG Tabs tablet  Commonly known as:  TRADJENTA  Take 5 mg  by mouth daily.     LINZESS 290 MCG Caps capsule  Generic drug:  Linaclotide  Take 290 mcg by mouth daily.     Melatonin 3 MG Tabs  Take 6 mg by mouth at bedtime.     metFORMIN 500 MG tablet  Commonly known as:  GLUCOPHAGE  Take 1,000 mg by mouth 2 (two) times daily with a meal.     mirtazapine 30 MG tablet  Commonly known as:  REMERON  Take 30 mg by mouth at bedtime.     nitroGLYCERIN 0.4 MG SL tablet  Commonly known as:  NITROSTAT  Place 0.4 mg under the tongue every 5 (five) minutes as needed for chest pain.     oxyCODONE 15 MG immediate release tablet  Commonly known as:  ROXICODONE  Take one tablet by mouth 5 times daily at 5am,9am, 1pm,5pm 9pm for pain; Take one tablet by mouth as needed between 10pm-5am for breakthrough pain     pregabalin 100 MG capsule  Commonly known as:  LYRICA  Take one capsule by mouth every night at bedtime for chronic neuropathy     promethazine 25 MG tablet  Commonly known as:  PHENERGAN  Take 25 mg by mouth every 6 (six) hours as needed for nausea or vomiting.     ranitidine 150 MG capsule  Commonly known as:  ZANTAC  Take 150 mg by mouth 2 (two) times daily as needed for heartburn.     rosuvastatin 40 MG tablet  Commonly known as:  CRESTOR  Take 40 mg by mouth every evening.     sertraline 50 MG tablet  Commonly known as:  ZOLOFT  Take 50 mg by mouth daily.     simethicone 80 MG chewable tablet  Commonly known as:  MYLICON  Chew 209 mg by mouth 3 (three) times daily.     spironolactone 25 MG tablet  Commonly known as:  ALDACTONE  Take 25 mg by mouth 2 (two) times daily.     SYSTANE ULTRA OP  Place 1 drop into both eyes daily. And as needed     tiotropium 18 MCG inhalation capsule  Commonly known as:  SPIRIVA  Place 18 mcg into inhaler and inhale daily.       Physical exam BP 149/67 mmHg  Pulse 89  Temp(Src) 96.8 F (36 C)  Resp 18  SpO2 96%  General- elderly obese female in no acute distress Head- atraumatic,  normocephalic Eyes- no pallor, no icterus, no discharge Neck- no lymphadenopathy Mouth- normal mucus membrane, upper and  lower dentures Cardiovascular- normal s1,s2, no murmurs, normal distal pulses, no leg edema Respiratory- bilateral clear to auscultation, no wheeze, no rhonchi, no crackles Abdomen- bowel sounds present, firm, distended, non tender, no guarding or rigidity, no CVA tenderness Musculoskeletal- able to move right sided extremities, left sided hemiplegia with left foot drop, no leg edema Neurological- no focal deficit Skin- warm and dry, redness on the back is generalized, scratch marks on left side of abdominal wall Psychiatry- alert and oriented to person, place and time, normal mood and affect    Labs  CBC Latest Ref Rng 08/30/2014 05/16/2014 04/17/2014  WBC 4.0 - 10.5 K/uL 8.5 6.7 8.1  Hemoglobin 12.0 - 15.0 g/dL 11.0(L) 11.2(L) 11.6(A)  Hematocrit 36.0 - 46.0 % 34.9(L) 36.0 38  Platelets 150 - 400 K/uL 264 264 269   CMP Latest Ref Rng 08/30/2014 08/17/2014 05/16/2014  Glucose 70 - 99 mg/dL 179(H) - 310(H)  BUN 6 - 23 mg/dL 17 17 24(H)  Creatinine 0.50 - 1.10 mg/dL 0.66 0.5 0.59  Sodium 137 - 147 mEq/L 135(L) 136(A) 137  Potassium 3.7 - 5.3 mEq/L 4.4 4.4 4.3  Chloride 96 - 112 mEq/L 95(L) - 94(L)  CO2 19 - 32 mEq/L 28 - 30  Calcium 8.4 - 10.5 mg/dL 8.8 - 9.1  Total Protein 6.0 - 8.3 g/dL - - 7.8  Total Bilirubin 0.3 - 1.2 mg/dL - - 0.2(L)  Alkaline Phos 39 - 117 U/L - - 96  AST 0 - 37 U/L - - 14  ALT 0 - 35 U/L - - 11   Lab Results  Component Value Date   HGBA1C 9.0* 09/16/2014   Lab Results  Component Value Date   TSH 2.50 09/16/2014   08/12/14 urine microalbumin/cr 12.6  Assessment/plan  Type II diabetes mellitus with neurological manifestations, uncontrolle Recent A1C 9.0. CBG range between 102-400s. Continue metformin 1g twice daily, levemir 30 units daily in the morning and 70 units nightly, Humalog 25 units three times daily before meals with  additional 15units for cbg >150, and trajenta 5mg  daily. Start glimeperide 2mg  daily. Urine microalbumin normal . Foot exam and eye up to date. Stress importance of following dietary recommendations. Continue to monitor cbg and patient status. Continue statin   Hypothyroidism Stable. Continue synthroid 26mcg daily. Check tsh every 6 month  Constipation Stable. Continue colace 200mg  twice daily. No known hx of IBS. Change linzess to 149 mcg daily  Hypertensive heart disease Mildly elevated today. On imdur 30mg  daily, lasix 40mg  daily and aldactone 25mg  twice daily. Continue to monitor  7. Spastic hemiplegia affecting left side as late effect of cerebrovascular accident Stable. Continue baclofen three times daily and flexeril 5mg  twice daily for spasticity.   8. Insomnia Stable. Continue melatonin 6mg  nightly and remeron 30mg  nightly. Continue to monitor  9. Chronic pain On-going. Is followed by pain clinic. Continue lyrica 100mg  daily at bedtime, oxycodone 15mg  five times daily with additional 15mg  daily as needed between 10pm and 5am, flexeril 5mg  twice daily, baclofen 10mg  three time daily, and voltaren gel 2 g topically to both feet nightly.   10. Depression and anxiety Stable. Continue remeron 30mg  daily, zoloft 50mg  daily, and klonopin 0.5mg  every six hours as needed for anxiety. Continue psychotherapy and monitor for change in mood.  11. HLD LDL 132. Continue crestor 40mg  daily and fish oil 2g daily.  12. Chronic ethmoidal sinusitis Is being followed by ENT. Continue and complete Augmentin 875/125mg  twice daily with flonase nasal spray x 30days. Continue to  monitor her status

## 2014-12-15 ENCOUNTER — Telehealth: Payer: Self-pay | Admitting: *Deleted

## 2014-12-15 DIAGNOSIS — F39 Unspecified mood [affective] disorder: Secondary | ICD-10-CM | POA: Diagnosis not present

## 2014-12-15 DIAGNOSIS — E0840 Diabetes mellitus due to underlying condition with diabetic neuropathy, unspecified: Secondary | ICD-10-CM | POA: Diagnosis not present

## 2014-12-15 DIAGNOSIS — F419 Anxiety disorder, unspecified: Secondary | ICD-10-CM | POA: Diagnosis not present

## 2014-12-15 NOTE — Telephone Encounter (Signed)
Dr Hilarie Fredrickson has received results of patient's Cologuard test from 11/08/14. He notes that results are negative and to "repeat in 3 years." I have spoken to [place], nurse where patient resides (she lives in assisted living) to advise her of this. She states she will let Ms.Chiasson know. I have also placed a reminder in EPIC for repeat testing to be done in 3 years.

## 2014-12-16 ENCOUNTER — Encounter: Payer: Self-pay | Admitting: Podiatry

## 2014-12-16 ENCOUNTER — Ambulatory Visit (INDEPENDENT_AMBULATORY_CARE_PROVIDER_SITE_OTHER): Payer: Medicare Other | Admitting: Podiatry

## 2014-12-16 ENCOUNTER — Ambulatory Visit: Payer: Medicare Other

## 2014-12-16 VITALS — BP 110/67 | HR 93 | Resp 18

## 2014-12-16 DIAGNOSIS — M2040 Other hammer toe(s) (acquired), unspecified foot: Secondary | ICD-10-CM | POA: Diagnosis not present

## 2014-12-16 DIAGNOSIS — R0989 Other specified symptoms and signs involving the circulatory and respiratory systems: Secondary | ICD-10-CM | POA: Diagnosis not present

## 2014-12-16 DIAGNOSIS — R261 Paralytic gait: Secondary | ICD-10-CM

## 2014-12-16 DIAGNOSIS — I2510 Atherosclerotic heart disease of native coronary artery without angina pectoris: Secondary | ICD-10-CM | POA: Diagnosis not present

## 2014-12-16 DIAGNOSIS — R52 Pain, unspecified: Secondary | ICD-10-CM

## 2014-12-16 DIAGNOSIS — R2689 Other abnormalities of gait and mobility: Secondary | ICD-10-CM

## 2014-12-16 DIAGNOSIS — M21379 Foot drop, unspecified foot: Secondary | ICD-10-CM

## 2014-12-16 NOTE — Progress Notes (Signed)
   Subjective:    Patient ID: Susan [name], female    DOB: [birth year], 64 y.o.   MRN: [id number]  HPI  64 year old female presents to the daughter with complaints of left foot drop and and the toes curling on the right side. She states this makes it difficult to wear a shoe. Due to a stroke she is paralyzed on the left side and is confined to wheelchair the majority of time. She does not ambulate much. She has an AFO brace that she wearsat times however she has not worn it for quite some time.she is inquiring a surgical intervention to help straighten the toes. No other complaints at this time.  Review of Systems  All other systems reviewed and are negative.      Objective:   Physical Exam AAO 3, NAD, presents in a wheelchair. DP/PT pulses decreased bilaterally, CRT 4 seconds Protective sensation decreased with Simms Weinstein monofilament, decreased vibratory sensation, decreased Achilles tendon reflex. There is weakness in ankle dorsiflexion as well as plantar flexion however is more significantly decreased in dorsiflexion. There are flexion contractures of the digits 1-5 on the left side. Patient's unable to dorsiflex the digits. There is mild chronic edema to bilateral lower sure these. There are no areas of pinpoint bony tenderness to bilateral lower extremities.  No open lesions or pre-ulcerative lesions No pain with calf compression, erythema, warmth       Assessment & Plan:  64 year old female with left dropfoot, flexion contractures of digits -Unable to get x-rays at today's appointment. A perception for x-rays of the left foot and ankle were given the patient's obtunded her facility. -Given decreased osseous doesn't relate capillary refill time recommend arterial studies. Perception for this was also given. -Prescription for an AFO and custom inserts to help straighten the toes was also given to the patient. I will try conservative treatment before proceeding with any surgical  intervention. I don't believe that surgical invention will help decrease her quality of life and given her vascular status she would likely not heal. -Follow-up after these things are done or sooner if any problems are to arise. In the meantime encouraged to call the office.

## 2014-12-16 NOTE — Patient Instructions (Addendum)
Follow up after x-rays, vascular studies, and evaluation by [facility] for brace/custom shoe. In the meantime, please call with any questions/concerns/change in symptoms.  If you have already had these studies preformed, please send [place] a copy of the results. Please bring a copy of the x-rays with you.  It was nice to meet you. Have a good day.

## 2014-12-17 ENCOUNTER — Telehealth: Payer: Self-pay | Admitting: *Deleted

## 2014-12-17 NOTE — Telephone Encounter (Signed)
[place] would like the orders for the Vascular Studies to be faxed to [phone].

## 2014-12-18 DIAGNOSIS — M25579 Pain in unspecified ankle and joints of unspecified foot: Secondary | ICD-10-CM | POA: Diagnosis not present

## 2014-12-18 DIAGNOSIS — R52 Pain, unspecified: Secondary | ICD-10-CM | POA: Diagnosis not present

## 2014-12-22 DIAGNOSIS — M797 Fibromyalgia: Secondary | ICD-10-CM | POA: Diagnosis not present

## 2014-12-22 DIAGNOSIS — G629 Polyneuropathy, unspecified: Secondary | ICD-10-CM | POA: Diagnosis not present

## 2014-12-22 DIAGNOSIS — F419 Anxiety disorder, unspecified: Secondary | ICD-10-CM | POA: Diagnosis not present

## 2014-12-22 DIAGNOSIS — F39 Unspecified mood [affective] disorder: Secondary | ICD-10-CM | POA: Diagnosis not present

## 2014-12-22 DIAGNOSIS — Z79899 Other long term (current) drug therapy: Secondary | ICD-10-CM | POA: Diagnosis not present

## 2014-12-22 DIAGNOSIS — G894 Chronic pain syndrome: Secondary | ICD-10-CM | POA: Diagnosis not present

## 2014-12-25 LAB — HEMOGLOBIN A1C: Hgb A1c MFr Bld: 8.3 % — AB (ref 4.0–6.0)

## 2014-12-31 DIAGNOSIS — F39 Unspecified mood [affective] disorder: Secondary | ICD-10-CM | POA: Diagnosis not present

## 2014-12-31 DIAGNOSIS — F419 Anxiety disorder, unspecified: Secondary | ICD-10-CM | POA: Diagnosis not present

## 2015-01-05 DIAGNOSIS — F39 Unspecified mood [affective] disorder: Secondary | ICD-10-CM | POA: Diagnosis not present

## 2015-01-05 DIAGNOSIS — F419 Anxiety disorder, unspecified: Secondary | ICD-10-CM | POA: Diagnosis not present

## 2015-01-07 ENCOUNTER — Encounter: Payer: Self-pay | Admitting: Internal Medicine

## 2015-01-11 ENCOUNTER — Encounter: Payer: Self-pay | Admitting: Registered Nurse

## 2015-01-11 ENCOUNTER — Non-Acute Institutional Stay (SKILLED_NURSING_FACILITY): Payer: Medicare Other | Admitting: Registered Nurse

## 2015-01-11 DIAGNOSIS — E785 Hyperlipidemia, unspecified: Secondary | ICD-10-CM | POA: Diagnosis not present

## 2015-01-11 DIAGNOSIS — K5903 Drug induced constipation: Secondary | ICD-10-CM

## 2015-01-11 DIAGNOSIS — G8929 Other chronic pain: Secondary | ICD-10-CM | POA: Diagnosis not present

## 2015-01-11 DIAGNOSIS — I10 Essential (primary) hypertension: Secondary | ICD-10-CM | POA: Diagnosis not present

## 2015-01-11 DIAGNOSIS — E039 Hypothyroidism, unspecified: Secondary | ICD-10-CM

## 2015-01-11 DIAGNOSIS — G4700 Insomnia, unspecified: Secondary | ICD-10-CM

## 2015-01-11 DIAGNOSIS — K5909 Other constipation: Secondary | ICD-10-CM

## 2015-01-11 DIAGNOSIS — E1141 Type 2 diabetes mellitus with diabetic mononeuropathy: Secondary | ICD-10-CM | POA: Diagnosis not present

## 2015-01-11 DIAGNOSIS — F418 Other specified anxiety disorders: Secondary | ICD-10-CM | POA: Diagnosis not present

## 2015-01-11 DIAGNOSIS — G8114 Spastic hemiplegia affecting left nondominant side: Secondary | ICD-10-CM | POA: Diagnosis not present

## 2015-01-11 DIAGNOSIS — J449 Chronic obstructive pulmonary disease, unspecified: Secondary | ICD-10-CM | POA: Diagnosis not present

## 2015-01-11 DIAGNOSIS — I2510 Atherosclerotic heart disease of native coronary artery without angina pectoris: Secondary | ICD-10-CM | POA: Diagnosis not present

## 2015-01-11 DIAGNOSIS — E1165 Type 2 diabetes mellitus with hyperglycemia: Secondary | ICD-10-CM

## 2015-01-11 DIAGNOSIS — IMO0002 Reserved for concepts with insufficient information to code with codable children: Secondary | ICD-10-CM

## 2015-01-11 DIAGNOSIS — E1149 Type 2 diabetes mellitus with other diabetic neurological complication: Secondary | ICD-10-CM

## 2015-01-11 NOTE — Progress Notes (Signed)
Patient ID: Susan [name], female   DOB: [birth year], 64 y.o.   MRN: [id number]   Place of Service: [facility]  Allergies  Allergen Reactions  . Onion Shortness Of Breath and Swelling  . Pollen Extract Itching  . Morphine And Related Hives    PT DENIES ALLERGY  . Niaspan [Niacin Er] Other (See Comments)    unknown  . Other     onions  . Valium [Diazepam] Other (See Comments)    unknown  . Wellbutrin [Bupropion] Other (See Comments)    unknown    Code Status: DNR  Goals of Care: Comfort and Quality of Life/LTC  Chief Complaint  Patient presents with  . Medical Management of Chronic Issues    insominia, uncontrolled DM2, chronic pain, depression/anxiety, constipation    HPI 64 y.o. female with PMH of old cva s/p left hemiplegia, chronic pain, insomnia, depression with anxiety, uncontrolled DM2, HTN, hypothyroidism among others is being seen for a routine visit for management of her chronic issues. Weight stable over the past 30 days. No recent fall or skin concerns reported. No change in behaviors or functional status reported. No concerns from nursing staff. DM2 is uncontrolled with CBG range 105-490s, with most reading in 200s. Resident remains noncompliant with dietary recommendations. Chronic pain is being followed by the pain clinic. Hypothyroidism stable on levothyroxine. No issues with constipation linzess and colace. Mood stable on zoloft and klonopin. HTN stable with BP range 100-130s/60-70s. No issues with insomnia on remeron. Seen in room today. Denies any concerns.   Review of Systems Constitutional: Negative for fever and chills HENT: Negative for congestion and sore throat.  Eyes: Negative for eye pain, eye discharge, and visual disturbance  Cardiovascular: Negative for chest pain and palpitations. Respiratory: Negative cough, shortness of breath, and wheezing.  Gastrointestinal: Negative for nausea, vomiting, and abdominal pain.  Genitourinary: Negative for  dysuria Musculoskeletal: Positive for generalized pain Neurological: Negative for  and headache and dizziness Skin: Negative for rash and wound.   Psychiatric: Positive for anxiety and depression. Negative for suicidal ideation.   Past Medical History  Diagnosis Date  . Hyperlipidemia   . Diabetes mellitus without complication   . COPD (chronic obstructive pulmonary disease)   . Depression   . Hypertension   . Thyroid disease   . Anemia   . CAD (coronary artery disease)   . Chronic pain   . Hemiplegia affecting non-dominant side, post-stroke   . Fall   . Urine retention   . Stroke     left side paralysis    Past Surgical History  Procedure Laterality Date  . Joint replacement      bil hip  . Abdominal hysterectomy    . Cholecystectomy    . Appendectomy    . Tonsillectomy      History   Social History  . Marital Status: Married    Spouse Name: N/A  . Number of Children: N/A  . Years of Education: N/A   Occupational History  . Not on file.   Social History Main Topics  . Smoking status: Former Smoker    Types: Cigarettes  . Smokeless tobacco: Not on file  . Alcohol Use: No  . Drug Use: No  . Sexual Activity: Not on file   Other Topics Concern  . Not on file   Social History Narrative      Medication List       This list is accurate as of: 01/11/15 11:24 PM.  Always use your most recent med list.               albuterol 108 (90 BASE) MCG/ACT inhaler  Commonly known as:  PROVENTIL HFA;VENTOLIN HFA  Inhale 1 puff into the lungs every 6 (six) hours as needed for wheezing or shortness of breath.     aspirin 81 MG tablet  Take 81 mg by mouth daily.     baclofen 10 MG tablet  Commonly known as:  LIORESAL  Take 10 mg by mouth 3 (three) times daily.     clonazePAM 0.5 MG tablet  Commonly known as:  KLONOPIN  Take one tablet by mouth every 6 hours as needed for anxiety     cyclobenzaprine 5 MG tablet  Commonly known as:  FLEXERIL  Take 5 mg by  mouth 2 (two) times daily.     diclofenac sodium 1 % Gel  Commonly known as:  VOLTAREN  Apply 2 g topically at bedtime. To both feet     docusate sodium 100 MG capsule  Commonly known as:  COLACE  Take 2 capsules (200 mg total) by mouth 2 (two) times daily.     Fish Oil 1000 MG Caps  Take 2 capsules by mouth daily.     fluticasone 50 MCG/ACT nasal spray  Commonly known as:  FLONASE  Place 2 sprays into both nostrils daily.     furosemide 20 MG tablet  Commonly known as:  LASIX  Take 2 tablets (40 mg total) by mouth daily.     glimepiride 2 MG tablet  Commonly known as:  AMARYL  Take 2 mg by mouth daily with breakfast.     insulin detemir 100 UNIT/ML injection  Commonly known as:  LEVEMIR  Inject 30-70 Units into the skin 2 (two) times daily. 30 units in the morning and 70 units at bedtime     insulin lispro 100 UNIT/ML injection  Commonly known as:  HUMALOG  Inject 0.15-0.25 mLs (15-25 Units total) into the skin 3 (three) times daily before meals. 25 units prior to meals with an additional 15 units prior to meals for cbg >=150     isosorbide mononitrate 30 MG 24 hr tablet  Commonly known as:  IMDUR  Take 30 mg by mouth daily.     levothyroxine 25 MCG tablet  Commonly known as:  SYNTHROID, LEVOTHROID  Take 25 mcg by mouth daily before breakfast.     linagliptin 5 MG Tabs tablet  Commonly known as:  TRADJENTA  Take 5 mg by mouth daily.     LINZESS 290 MCG Caps capsule  Generic drug:  Linaclotide  Take 290 mcg by mouth daily.     Melatonin 3 MG Tabs  Take 6 mg by mouth at bedtime.     metFORMIN 500 MG tablet  Commonly known as:  GLUCOPHAGE  Take 1,000 mg by mouth 2 (two) times daily with a meal.     nitroGLYCERIN 0.4 MG SL tablet  Commonly known as:  NITROSTAT  Place 0.4 mg under the tongue every 5 (five) minutes as needed for chest pain.     oxyCODONE 15 MG immediate release tablet  Commonly known as:  ROXICODONE  Take one tablet by mouth 5 times daily at  5am,9am, 1pm,5pm 9pm for pain; Take one tablet by mouth as needed between 10pm-5am for breakthrough pain     pregabalin 100 MG capsule  Commonly known as:  LYRICA  Take one capsule by mouth every night at bedtime  for chronic neuropathy     promethazine 25 MG tablet  Commonly known as:  PHENERGAN  Take 25 mg by mouth every 6 (six) hours as needed for nausea or vomiting.     rosuvastatin 40 MG tablet  Commonly known as:  CRESTOR  Take 40 mg by mouth every evening.     sertraline 50 MG tablet  Commonly known as:  ZOLOFT  Take 50 mg by mouth daily.     simethicone 80 MG chewable tablet  Commonly known as:  MYLICON  Chew 973 mg by mouth 3 (three) times daily.     spironolactone 25 MG tablet  Commonly known as:  ALDACTONE  Take 25 mg by mouth 2 (two) times daily.     SYSTANE ULTRA OP  Place 1 drop into both eyes daily. And as needed     tiotropium 18 MCG inhalation capsule  Commonly known as:  SPIRIVA  Place 18 mcg into inhaler and inhale daily.     traZODone 50 MG tablet  Commonly known as:  DESYREL  Take 50 mg by mouth at bedtime.        Physical Exam  BP 108/65 mmHg  Pulse 96  Temp(Src) 96.9 F (36.1 C)  Resp 16  Ht 5\' 3"  (1.6 m)  Wt 189 lb 3.2 oz (85.821 kg)  BMI 33.52 kg/m2  Constitutional: Obese adult female in no acute distress. Conversant and pleasant HEENT: Normocephalic and atraumatic. PERRL. EOM intact. Oral mucosa moist. Posterior pharynx clear of any exudate or lesions.  Neck: Supple and nontender. No lymphadenopathy. No JVD or carotid bruits. Cardiac: Normal S1, S2. RRR without appreciable murmurs, rubs, or gallops. Distal pulses intact. Trace edema of BLE Lungs: No respiratory distress. Breath sounds clear bilaterally without rales, rhonchi, or wheezes. Abdomen: Audible bowel sounds in all quadrants. Firm, distended. Nontender to palpation Musculoskeletal: Able to move right extremities. Left hemiplegia. Left hand contracture with brace in place. Left  foot drop. Both feet tender to palpation Skin: Warm and dry. No rash noted. Neurological: Alert and oriented to person, place, and time.  Psychiatric: Judgment and insight adequate. Appropriate mood and affect.   Labs Reviewed  CBC Latest Ref Rng 08/30/2014 05/16/2014 04/17/2014  WBC 4.0 - 10.5 K/uL 8.5 6.7 8.1  Hemoglobin 12.0 - 15.0 g/dL 11.0(L) 11.2(L) 11.6(A)  Hematocrit 36.0 - 46.0 % 34.9(L) 36.0 38  Platelets 150 - 400 K/uL 264 264 269    CMP Latest Ref Rng 08/30/2014 08/17/2014 05/16/2014  Glucose 70 - 99 mg/dL 179(H) - 310(H)  BUN 6 - 23 mg/dL 17 17 24(H)  Creatinine 0.50 - 1.10 mg/dL 0.66 0.5 0.59  Sodium 137 - 147 mEq/L 135(L) 136(A) 137  Potassium 3.7 - 5.3 mEq/L 4.4 4.4 4.3  Chloride 96 - 112 mEq/L 95(L) - 94(L)  CO2 19 - 32 mEq/L 28 - 30  Calcium 8.4 - 10.5 mg/dL 8.8 - 9.1  Total Protein 6.0 - 8.3 g/dL - - 7.8  Total Bilirubin 0.3 - 1.2 mg/dL - - 0.2(L)  Alkaline Phos 39 - 117 U/L - - 96  AST 0 - 37 U/L - - 14  ALT 0 - 35 U/L - - 11    Lab Results  Component Value Date   HGBA1C 8.3* 12/25/2014    Lab Results  Component Value Date   TSH 2.50 09/16/2014    Lipid Panel     Component Value Date/Time   CHOL 191 08/13/2014   TRIG 144 08/13/2014   HDL 30* 08/13/2014  [place] 132 08/13/2014    Assessment & Plan  1. Type II diabetes mellitus with neurological manifestations, uncontrolle Recent A1C 8.3 from 9.0. CBG range between 105-490s. Continue metformin 1g twice daily, levemir 30 units daily in the morning and 70 units nightly, Humalog 25 units three times daily before meals with additional 5 u for cbg 150-200, 8 u for 200-250 and 10 u for 250-300, trajenta 5mg  daily, and glimeperide 2mg  daily. Urine microalbumin normal . Foot exam and eye up to date. Continue aspirin and statin. Continue to monitor her status.   2. Hypothyroidism Normal TSH. Continue synthroid 69mcg daily.   3. Drug-induced constipation Stable. Continue colace 200mg  twice daily and linzess  233mcg daily. Encourage hydration and OOB as tolerated.    4. Chronic obstructive pulmonary disease, unspecified COPD, unspecified chronic bronchitis type No recent exacerbation. Continue spiriva 13mcg daily and albuterol every six as needed for shortness of breath and wheezing.   5. Coronary artery disease involving native coronary artery of native heart without angina pectoris Remains chest pain free. Continue imdur 30mg  daily and nitro prn for chest pain. Continue statin. Monitor clinically  6. Essential hypertension, benign Stable. BP range 100s-130s/60-70s. Continue lasix 40mg  daily and aldactone 25mg  twice daily. Continue to monitor bp  7. Spastic hemiplegia affecting left side as late effect of cerebrovascular accident Stable. Continue baclofen three times daily and flexeril 5mg  twice daily for spasticity.   8. Insomnia Discontinue remeron r/t to increased appetite/uncontrolled DM2. Continue melatonin 6mg  nightly. Start trazodone 50mg  daily at bedtime. Reassess and continue to monitor.   9. Chronic pain syndome Persists. Is followed by pain clinic. Continue lyrica 100mg  daily at bedtime, oxycodone 15mg  five times daily with additional 15mg  daily as needed between 10pm and 5am, flexeril 5mg  twice daily, baclofen 10mg  three time daily, and voltaren gel 2 g topically to both feet nightly.   10. Depression and anxiety Stable. Continue zoloft 50mg  daily with klonopin 0.5mg  every six hours as needed for anxiety. Continue psychotherapy and monitor for change in mood.  11. HLD LDL 132. Continue crestor 40mg  daily and fish oil 2g daily.   Family/Staff Communication Plan of care discussed with resident and nursing staff. Resident and nursing staff verbalized understanding and agree with plan of care. No additional questions or concerns reported.    Arthur Holms, MSN, AGNP-C [facility] [address] [place],  [id number] [phone] [8am-5pm] After hours: [phone]

## 2015-01-14 DIAGNOSIS — F39 Unspecified mood [affective] disorder: Secondary | ICD-10-CM | POA: Diagnosis not present

## 2015-01-14 DIAGNOSIS — F419 Anxiety disorder, unspecified: Secondary | ICD-10-CM | POA: Diagnosis not present

## 2015-01-19 DIAGNOSIS — G629 Polyneuropathy, unspecified: Secondary | ICD-10-CM | POA: Diagnosis not present

## 2015-01-19 DIAGNOSIS — M797 Fibromyalgia: Secondary | ICD-10-CM | POA: Diagnosis not present

## 2015-01-19 DIAGNOSIS — G894 Chronic pain syndrome: Secondary | ICD-10-CM | POA: Diagnosis not present

## 2015-01-19 DIAGNOSIS — M1990 Unspecified osteoarthritis, unspecified site: Secondary | ICD-10-CM | POA: Diagnosis not present

## 2015-01-19 DIAGNOSIS — Z79899 Other long term (current) drug therapy: Secondary | ICD-10-CM | POA: Diagnosis not present

## 2015-01-22 DIAGNOSIS — F329 Major depressive disorder, single episode, unspecified: Secondary | ICD-10-CM | POA: Diagnosis not present

## 2015-01-22 DIAGNOSIS — F411 Generalized anxiety disorder: Secondary | ICD-10-CM | POA: Diagnosis not present

## 2015-01-22 DIAGNOSIS — G4700 Insomnia, unspecified: Secondary | ICD-10-CM | POA: Diagnosis not present

## 2015-02-02 DIAGNOSIS — F419 Anxiety disorder, unspecified: Secondary | ICD-10-CM | POA: Diagnosis not present

## 2015-02-02 DIAGNOSIS — F39 Unspecified mood [affective] disorder: Secondary | ICD-10-CM | POA: Diagnosis not present

## 2015-02-12 DIAGNOSIS — N390 Urinary tract infection, site not specified: Secondary | ICD-10-CM | POA: Diagnosis not present

## 2015-02-12 DIAGNOSIS — R300 Dysuria: Secondary | ICD-10-CM | POA: Diagnosis not present

## 2015-02-15 ENCOUNTER — Non-Acute Institutional Stay (SKILLED_NURSING_FACILITY): Payer: Medicare Other | Admitting: Internal Medicine

## 2015-02-15 DIAGNOSIS — E1141 Type 2 diabetes mellitus with diabetic mononeuropathy: Secondary | ICD-10-CM | POA: Diagnosis not present

## 2015-02-15 DIAGNOSIS — I110 Hypertensive heart disease with heart failure: Secondary | ICD-10-CM | POA: Diagnosis not present

## 2015-02-15 DIAGNOSIS — E1149 Type 2 diabetes mellitus with other diabetic neurological complication: Secondary | ICD-10-CM

## 2015-02-15 DIAGNOSIS — IMO0002 Reserved for concepts with insufficient information to code with codable children: Secondary | ICD-10-CM

## 2015-02-15 DIAGNOSIS — F331 Major depressive disorder, recurrent, moderate: Secondary | ICD-10-CM

## 2015-02-15 DIAGNOSIS — E038 Other specified hypothyroidism: Secondary | ICD-10-CM

## 2015-02-15 DIAGNOSIS — E1165 Type 2 diabetes mellitus with hyperglycemia: Secondary | ICD-10-CM

## 2015-02-15 DIAGNOSIS — F19921 Other psychoactive substance use, unspecified with intoxication with delirium: Secondary | ICD-10-CM

## 2015-02-15 DIAGNOSIS — R410 Disorientation, unspecified: Secondary | ICD-10-CM

## 2015-02-15 DIAGNOSIS — I509 Heart failure, unspecified: Secondary | ICD-10-CM

## 2015-02-15 NOTE — Progress Notes (Signed)
Patient ID: Susan [name], female   DOB: March 07, 1951, 64 y.o.   MRN: [id number]    [facility]  PCP: Blanchie Serve, MD  Code Status: DNR  Allergies  Allergen Reactions  . Onion Shortness Of Breath and Swelling  . Pollen Extract Itching  . Morphine And Related Hives    PT DENIES ALLERGY  . Niaspan [Niacin Er] Other (See Comments)    unknown  . Other     onions  . Valium [Diazepam] Other (See Comments)    unknown  . Wellbutrin [Bupropion] Other (See Comments)    unknown    Chief Complaint  Patient presents with  . Medical Management of Chronic Issues    Routine visit and family concerns      HPI 64 y/o female patient is seen for routine visit. There has been some concerns from family about behavioral changes. She has been calling her sister and her daughter a couple of times early in the morning and recently was upset with her daughter. She called her sister and said false things about her daughter and got upset. When asked about this , she mentions that she feels her daughter does not care for her as she should and this frustrated her. Thus, she called her sister to vent out. She understands that she upset her sister by doing this and mentions she has apologized with her. she mentions still being upset at her daughter because- " she told me I am not her mother anymore".  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  On review of her medications, trazodone in new and was started on 01/11/15. As per patient this medication is helping her rest through the night. She was also started on lyrica 200 mg in 4/16. She feels the lyrica is finally working for her and would not want it changed. She is also followed by pain clinic Reviewed cbg 200-286 with highest 311 and lowest 133. As per staff non compliant with her diet. Weight stable on review   Review of Systems  Constitutional:  Negative for fever, chills, diaphoresis.  HENT: Negative for congestion, hearing loss and sore throat.   Eyes: Negative for blurred vision, double vision and discharge.  Respiratory: Negative for cough, sputum production, shortness of breath and wheezing.   Cardiovascular: Negative for chest pain, palpitations,leg swelling.  Gastrointestinal: Negative for heartburn, nausea, vomiting, abdominal pain. Has chronic constipation.  Genitourinary: positive for dysuria, negative for flank pain.  Musculoskeletal: Negative for falls Skin: Negative for rash.  Neurological: Negative for dizziness, tingling, headaches.  Psychiatric/Behavioral: history of depression and followed by psych services  Past Medical History  Diagnosis Date  . Hyperlipidemia   . Diabetes mellitus without complication   . COPD (chronic obstructive pulmonary disease)   . Depression   . Hypertension   . Thyroid disease   . Anemia   . CAD (coronary artery disease)   . Chronic pain   . Hemiplegia affecting non-dominant side, post-stroke   . Fall   . Urine retention   . Stroke     left side paralysis     Medication List       This list is accurate as of: 02/15/15  5:53 PM.  Always use your most recent med list.               aspirin 81 MG tablet  Take 81 mg by mouth daily.  baclofen 10 MG tablet  Commonly known as:  LIORESAL  Take 10 mg by mouth 3 (three) times daily.     clonazePAM 0.5 MG tablet  Commonly known as:  KLONOPIN  Take one tablet by mouth every 6 hours as needed for anxiety     cyclobenzaprine 5 MG tablet  Commonly known as:  FLEXERIL  Take 5 mg by mouth 2 (two) times daily.     docusate sodium 100 MG capsule  Commonly known as:  COLACE  Take 2 capsules (200 mg total) by mouth 2 (two) times daily.     Fish Oil 1000 MG Caps  Take 2 capsules by mouth daily.     fluticasone 50 MCG/ACT nasal spray  Commonly known as:  FLONASE  Place 2 sprays into both nostrils daily.     furosemide 40  MG tablet  Commonly known as:  LASIX  Take 40 mg by mouth daily.     glimepiride 2 MG tablet  Commonly known as:  AMARYL  Take 2 mg by mouth daily with breakfast.     guaiFENesin-dextromethorphan 100-10 MG/5ML syrup  Commonly known as:  ROBITUSSIN DM  Take 10 mLs by mouth every 6 (six) hours as needed for cough.     hydrOXYzine 25 MG tablet  Commonly known as:  ATARAX/VISTARIL  Take 25 mg by mouth every 8 (eight) hours as needed for itching.     insulin detemir 100 UNIT/ML injection  Commonly known as:  LEVEMIR  Inject 30-70 Units into the skin 2 (two) times daily. 30 units in the morning and 70 units at bedtime     insulin lispro 100 UNIT/ML injection  Commonly known as:  HUMALOG  Inject 0.15-0.25 mLs (15-25 Units total) into the skin 3 (three) times daily before meals. 25 units prior to meals with an additional 15 units prior to meals for cbg >=150     isosorbide mononitrate 30 MG 24 hr tablet  Commonly known as:  IMDUR  Take 30 mg by mouth daily.     levothyroxine 25 MCG tablet  Commonly known as:  SYNTHROID, LEVOTHROID  Take 25 mcg by mouth daily before breakfast.     Linaclotide 145 MCG Caps capsule  Commonly known as:  LINZESS  Take 145 mcg by mouth daily.     linagliptin 5 MG Tabs tablet  Commonly known as:  TRADJENTA  Take 5 mg by mouth daily.     Melatonin 3 MG Tabs  Take 6 mg by mouth at bedtime.     metFORMIN 1000 MG tablet  Commonly known as:  GLUCOPHAGE  Take 1,000 mg by mouth 2 (two) times daily with a meal.     nitroGLYCERIN 0.4 MG SL tablet  Commonly known as:  NITROSTAT  Place 0.4 mg under the tongue every 5 (five) minutes as needed for chest pain.     oxyCODONE 15 MG immediate release tablet  Commonly known as:  ROXICODONE  Take one tablet by mouth 5 times daily at 5am,9am, 1pm,5pm 9pm for pain; Take one tablet by mouth as needed between 10pm-5am for breakthrough pain     pregabalin 200 MG capsule  Commonly known as:  LYRICA  Take 200 mg by  mouth at bedtime.     promethazine 12.5 MG tablet  Commonly known as:  PHENERGAN  Take 12.5 mg by mouth every 6 (six) hours as needed for nausea or vomiting.     rosuvastatin 40 MG tablet  Commonly known as:  CRESTOR  Take 40 mg by mouth every evening.     sertraline 50 MG tablet  Commonly known as:  ZOLOFT  50 mg. 1 1/2 by mouth daily     simethicone 80 MG chewable tablet  Commonly known as:  MYLICON  Chew 784 mg by mouth 3 (three) times daily.     spironolactone 25 MG tablet  Commonly known as:  ALDACTONE  Take 25 mg by mouth 2 (two) times daily.     SYSTANE ULTRA OP  Place 1 drop into both eyes daily. And as needed     tiotropium 18 MCG inhalation capsule  Commonly known as:  SPIRIVA  Place 18 mcg into inhaler and inhale daily.     traZODone 100 MG tablet  Commonly known as:  DESYREL  Take 100 mg by mouth at bedtime.       Physical exam BP 122/76 mmHg  Pulse 89  Temp(Src) 98.3 F (36.8 C) (Oral)  Resp 18  Wt 188 lb (85.276 kg)  SpO2 90%  Wt Readings from Last 3 Encounters:  02/15/15 188 lb (85.276 kg)  01/11/15 189 lb 3.2 oz (85.821 kg)  12/14/14 190 lb 12.8 oz (86.546 kg)   General- elderly obese female in no acute distress Head- atraumatic, normocephalic Eyes- no pallor, no icterus, no discharge Neck- no lymphadenopathy Mouth- normal mucus membrane, upper and lower dentures Cardiovascular- normal s1,s2, no murmurs, normal distal pulses, no leg edema Respiratory- bilateral clear to auscultation, no wheeze, no rhonchi, no crackles Abdomen- bowel sounds present, firm, distended, non tender, no guarding or rigidity, no CVA tenderness Musculoskeletal- able to move right sided extremities, left sided hemiplegia with left foot drop, no leg edema Neurological- no focal deficit Skin- warm and dry, redness on the back is generalized, scratch marks on left side of abdominal wall Psychiatry- alert and oriented to person, place and time, normal mood and affect      Labs  CBC Latest Ref Rng 08/30/2014 05/16/2014 04/17/2014  WBC 4.0 - 10.5 K/uL 8.5 6.7 8.1  Hemoglobin 12.0 - 15.0 g/dL 11.0(L) 11.2(L) 11.6(A)  Hematocrit 36.0 - 46.0 % 34.9(L) 36.0 38  Platelets 150 - 400 K/uL 264 264 269   CMP Latest Ref Rng 08/30/2014 08/17/2014 05/16/2014  Glucose 70 - 99 mg/dL 179(H) - 310(H)  BUN 6 - 23 mg/dL 17 17 24(H)  Creatinine 0.50 - 1.10 mg/dL 0.66 0.5 0.59  Sodium 137 - 147 mEq/L 135(L) 136(A) 137  Potassium 3.7 - 5.3 mEq/L 4.4 4.4 4.3  Chloride 96 - 112 mEq/L 95(L) - 94(L)  CO2 19 - 32 mEq/L 28 - 30  Calcium 8.4 - 10.5 mg/dL 8.8 - 9.1  Total Protein 6.0 - 8.3 g/dL - - 7.8  Total Bilirubin 0.3 - 1.2 mg/dL - - 0.2(L)  Alkaline Phos 39 - 117 U/L - - 96  AST 0 - 37 U/L - - 14  ALT 0 - 35 U/L - - 11   Lab Results  Component Value Date   HGBA1C 8.3* 12/25/2014   Lab Results  Component Value Date   TSH 2.50 09/16/2014   08/12/14 urine microalbumin/cr 12.6  Assessment/plan  Delirium Occuring in the evening. Two new medications lyrica and trazodone along with remeron can contribute to this. Pt does not want lyrica changed/ adjusted. Trazodone can cause confusion/ memory impairment. Decrease this to 50 mg qhs for 1 week, then 25 mg qhs for a week and then stop. She also has dysuria- send urine for culture to evaluate further. Also  check her thyroid function. After having her off trazodone, if this behavioral change persists, will get dementia work up as pt has hx of CVA with encephalomalacia, brain atrophy noted on ct scan of the head. Will also get psychotherapy consult for now for counselling. Check cbc and cmp  Depression Appears to have worsened. Could be a side effect of trazodone as this is the newest medication in her list. Weaning her off trazodone. Continue remeron and prn klonopin for now. Followed by psych services  Dm type 2 uncontrolled Her cbg has been running high. Last a1c 8.3 from 9. on metformin 1000 mg bid, levemir 30 u am and 70 u pm,  humalog 25 u with meals tid, tradjenta 5 mg daily and amaryl 2 mg daily with SSI. Change levemir to 35 u in am and 70 u in pm and reassess. Monitor cbg. Dietary compliance reinforced, dietary counselling from RD to be provided. Continue statin and aspirin  Hypertensive heart disease Stable.  On imdur 30mg  daily, lasix 40mg  daily and aldactone 25mg  twice daily. Continue current regimen and monitor bp reading.   Hypothyroidism Recheck tsh. Continue synthroid 20mcg daily.   Labs- tsh, cbc with diff, cmp, urine culture  Family/ staff Communication: reviewed care plan with patient and nursing supervisor and DON  Blanchie Serve, MD  [facility] [phone] (Monday-Friday 8 am - 5 pm) [phone] (afterhours)

## 2015-02-16 DIAGNOSIS — E119 Type 2 diabetes mellitus without complications: Secondary | ICD-10-CM | POA: Diagnosis not present

## 2015-02-16 DIAGNOSIS — F329 Major depressive disorder, single episode, unspecified: Secondary | ICD-10-CM | POA: Diagnosis not present

## 2015-02-16 DIAGNOSIS — F411 Generalized anxiety disorder: Secondary | ICD-10-CM | POA: Diagnosis not present

## 2015-02-16 DIAGNOSIS — E0840 Diabetes mellitus due to underlying condition with diabetic neuropathy, unspecified: Secondary | ICD-10-CM | POA: Diagnosis not present

## 2015-02-16 DIAGNOSIS — G4700 Insomnia, unspecified: Secondary | ICD-10-CM | POA: Diagnosis not present

## 2015-02-17 LAB — BASIC METABOLIC PANEL
BUN: 15 mg/dL (ref 4–21)
Creatinine: 0.7 mg/dL (ref 0.5–1.1)
GLUCOSE: 174 mg/dL
Potassium: 3.5 mmol/L (ref 3.4–5.3)
Sodium: 138 mmol/L (ref 137–147)

## 2015-02-17 LAB — HEPATIC FUNCTION PANEL
ALT: 12 U/L (ref 7–35)
AST: 13 U/L (ref 13–35)
Alkaline Phosphatase: 94 U/L (ref 25–125)
Bilirubin, Total: 0.3 mg/dL

## 2015-02-17 LAB — TSH: TSH: 2.61 u[IU]/mL (ref 0.41–5.90)

## 2015-02-17 LAB — CBC AND DIFFERENTIAL
HEMATOCRIT: 34 % — AB (ref 36–46)
Hemoglobin: 10.6 g/dL — AB (ref 12.0–16.0)
PLATELETS: 221 10*3/uL (ref 150–399)
WBC: 8.3 10*3/mL

## 2015-02-22 DIAGNOSIS — G894 Chronic pain syndrome: Secondary | ICD-10-CM | POA: Diagnosis not present

## 2015-02-22 DIAGNOSIS — M1990 Unspecified osteoarthritis, unspecified site: Secondary | ICD-10-CM | POA: Diagnosis not present

## 2015-02-22 DIAGNOSIS — Z79899 Other long term (current) drug therapy: Secondary | ICD-10-CM | POA: Diagnosis not present

## 2015-02-22 DIAGNOSIS — M797 Fibromyalgia: Secondary | ICD-10-CM | POA: Diagnosis not present

## 2015-02-22 DIAGNOSIS — G629 Polyneuropathy, unspecified: Secondary | ICD-10-CM | POA: Diagnosis not present

## 2015-02-23 ENCOUNTER — Other Ambulatory Visit: Payer: Self-pay | Admitting: *Deleted

## 2015-02-23 MED ORDER — CLONAZEPAM 0.5 MG PO TABS: ORAL_TABLET | ORAL | Status: DC

## 2015-02-23 NOTE — Telephone Encounter (Signed)
[facility] 

## 2015-02-24 ENCOUNTER — Non-Acute Institutional Stay (SKILLED_NURSING_FACILITY): Payer: Medicare Other | Admitting: Nurse Practitioner

## 2015-02-24 DIAGNOSIS — G4700 Insomnia, unspecified: Secondary | ICD-10-CM

## 2015-02-24 DIAGNOSIS — F19921 Other psychoactive substance use, unspecified with intoxication with delirium: Secondary | ICD-10-CM

## 2015-02-24 DIAGNOSIS — T50905A Adverse effect of unspecified drugs, medicaments and biological substances, initial encounter: Secondary | ICD-10-CM

## 2015-02-24 NOTE — Progress Notes (Signed)
Patient ID: Susan [name], female   DOB: 01-31-1951, 64 y.o.   MRN: [id number]    Nursing Home Location:  [place] of Service: SNF (31)  PCP: Blanchie Serve, MD  Allergies  Allergen Reactions  . Onion Shortness Of Breath and Swelling  . Pollen Extract Itching  . Morphine And Related Hives    PT DENIES ALLERGY  . Niaspan [Niacin Er] Other (See Comments)    unknown  . Other     onions  . Valium [Diazepam] Other (See Comments)    unknown  . Wellbutrin [Bupropion] Other (See Comments)    unknown    Chief Complaint  Patient presents with  . Acute Visit    confusion    HPI:  Patient is a 64 y.o. female seen today at [facility] and [facility] at the request of nursing to speak with pt and her sister regarding neurology referral. Pt was seen by Dr Bubba Camp on 6/62016 due to behavioral concerns, hallucinations and paranoia. Trazodone was titrated off and nursing and pt reports things have improved however pts sister is still concerned over this and would like work up to be done by neurology.   Review of Systems:  Review of Systems  Constitutional: Negative for activity change, appetite change and unexpected weight change.  Respiratory: Negative for shortness of breath.   Cardiovascular: Negative for chest pain.  Gastrointestinal: Negative for abdominal pain and diarrhea. Constipation: chronic constipation.  Neurological: Negative for dizziness.  Psychiatric/Behavioral: Positive for sleep disturbance.       Hx of depression and confusion. Denies any hallucinations at this time     Past Medical History  Diagnosis Date  . Hyperlipidemia   . Diabetes mellitus without complication   . COPD (chronic obstructive pulmonary disease)   . Depression   . Hypertension   . Thyroid disease   . Anemia   . CAD (coronary artery disease)   . Chronic pain   . Hemiplegia affecting non-dominant side, post-stroke   . Fall   . Urine retention   . Stroke     left  side paralysis   Past Surgical History  Procedure Laterality Date  . Joint replacement      bil hip  . Abdominal hysterectomy    . Cholecystectomy    . Appendectomy    . Tonsillectomy     Social History:   reports that she has quit smoking. Her smoking use included Cigarettes. She does not have any smokeless tobacco history on file. She reports that she does not drink alcohol or use illicit drugs.  No family history on file.  Medications: Patient's Medications  New Prescriptions   No medications on file  Previous Medications   ASPIRIN 81 MG TABLET    Take 81 mg by mouth daily.   BACLOFEN (LIORESAL) 10 MG TABLET    Take 10 mg by mouth 3 (three) times daily.    CLONAZEPAM (KLONOPIN) 0.5 MG TABLET    Take one tablet by mouth every 6 hours as needed for anxiety   CYCLOBENZAPRINE (FLEXERIL) 5 MG TABLET    Take 5 mg by mouth 2 (two) times daily.   DOCUSATE SODIUM (COLACE) 100 MG CAPSULE    Take 2 capsules (200 mg total) by mouth 2 (two) times daily.   FLUTICASONE (FLONASE) 50 MCG/ACT NASAL SPRAY    Place 2 sprays into both nostrils daily.   FUROSEMIDE (LASIX) 40 MG TABLET    Take 40 mg by  mouth daily.   GLIMEPIRIDE (AMARYL) 2 MG TABLET    Take 2 mg by mouth daily with breakfast.   GUAIFENESIN-DEXTROMETHORPHAN (ROBITUSSIN DM) 100-10 MG/5ML SYRUP    Take 10 mLs by mouth every 6 (six) hours as needed for cough.   HYDROXYZINE (ATARAX/VISTARIL) 25 MG TABLET    Take 25 mg by mouth every 8 (eight) hours as needed for itching.   INSULIN DETEMIR (LEVEMIR) 100 UNIT/ML INJECTION    Inject 30-70 Units into the skin 2 (two) times daily. 30 units in the morning and 70 units at bedtime   INSULIN LISPRO (HUMALOG) 100 UNIT/ML INJECTION    Inject 0.15-0.25 mLs (15-25 Units total) into the skin 3 (three) times daily before meals. 25 units prior to meals with an additional 15 units prior to meals for cbg >=150   ISOSORBIDE MONONITRATE (IMDUR) 30 MG 24 HR TABLET    Take 30 mg by mouth daily.   LEVOTHYROXINE  (SYNTHROID, LEVOTHROID) 25 MCG TABLET    Take 25 mcg by mouth daily before breakfast.   LINACLOTIDE (LINZESS) 145 MCG CAPS CAPSULE    Take 145 mcg by mouth daily.   LINAGLIPTIN (TRADJENTA) 5 MG TABS TABLET    Take 5 mg by mouth daily.   MELATONIN 3 MG TABS    Take 6 mg by mouth at bedtime.   METFORMIN (GLUCOPHAGE) 1000 MG TABLET    Take 1,000 mg by mouth 2 (two) times daily with a meal.   NITROGLYCERIN (NITROSTAT) 0.4 MG SL TABLET    Place 0.4 mg under the tongue every 5 (five) minutes as needed for chest pain.   OMEGA-3 FATTY ACIDS (FISH OIL) 1000 MG CAPS    Take 2 capsules by mouth daily.   OXYCODONE (ROXICODONE) 15 MG IMMEDIATE RELEASE TABLET    Take one tablet by mouth 5 times daily at 5am,9am, 1pm,5pm 9pm for pain; Take one tablet by mouth as needed between 10pm-5am for breakthrough pain   POLYETHYL GLYCOL-PROPYL GLYCOL (SYSTANE ULTRA OP)    Place 1 drop into both eyes daily. And as needed   PREGABALIN (LYRICA) 200 MG CAPSULE    Take 200 mg by mouth at bedtime.   PROMETHAZINE (PHENERGAN) 12.5 MG TABLET    Take 12.5 mg by mouth every 6 (six) hours as needed for nausea or vomiting.   ROSUVASTATIN (CRESTOR) 40 MG TABLET    Take 40 mg by mouth every evening.    SERTRALINE (ZOLOFT) 50 MG TABLET    50 mg. 1 1/2 by mouth daily   SIMETHICONE (MYLICON) 80 MG CHEWABLE TABLET    Chew 160 mg by mouth 3 (three) times daily.    SPIRONOLACTONE (ALDACTONE) 25 MG TABLET    Take 25 mg by mouth 2 (two) times daily.    TIOTROPIUM (SPIRIVA) 18 MCG INHALATION CAPSULE    Place 18 mcg into inhaler and inhale daily.  Modified Medications   No medications on file  Discontinued Medications   TRAZODONE (DESYREL) 100 MG TABLET    Take 100 mg by mouth at bedtime.     Physical Exam: Filed Vitals:   02/24/15 1657  BP: 125/55  Pulse: 84  Temp: 96.8 F (36 C)  Resp: 20    Physical Exam  Constitutional: She appears well-developed and well-nourished.  Cardiovascular: Normal rate, regular rhythm and normal heart  sounds.   Pulmonary/Chest: Effort normal and breath sounds normal.  Abdominal: Soft. Bowel sounds are normal. She exhibits no distension.  Neurological: She is alert.  Skin: Skin is  warm and dry. No erythema.  Psychiatric: She has a normal mood and affect. Her behavior is normal.    Labs reviewed: Basic Metabolic Panel:  Recent Labs  05/16/14 0114 08/17/14 08/30/14 2316  NA 137 136* 135*  K 4.3 4.4 4.4  CL 94*  --  95*  CO2 30  --  28  GLUCOSE 310*  --  179*  BUN 24* 17 17  CREATININE 0.59 0.5 0.66  CALCIUM 9.1  --  8.8   Liver Function Tests:  Recent Labs  03/10/14 05/16/14 0114  AST 12* 14  ALT 8 11  ALKPHOS 92 96  BILITOT  --  0.2*  PROT  --  7.8  ALBUMIN  --  3.1*   No results for input(s): LIPASE, AMYLASE in the last 8760 hours. No results for input(s): AMMONIA in the last 8760 hours. CBC:  Recent Labs  04/17/14 05/16/14 0114 08/30/14 2316  WBC 8.1 6.7 8.5  NEUTROABS  --  4.2 5.9  HGB 11.6* 11.2* 11.0*  HCT 38 36.0 34.9*  MCV  --  87.6 83.7  PLT 269 264 264   TSH:  Recent Labs  09/16/14  TSH 2.50   A1C: Lab Results  Component Value Date   HGBA1C 8.3* 12/25/2014   Lipid Panel:  Recent Labs  08/13/14  CHOL 191  HDL 30*  LDLCALC 132  TRIG 144     Assessment/Plan 1. Delirium, drug-induced -trazodone has been stopped due to hallucinations and confusion. Pt appropriate during visit. Per nursing acute delirium has improved however sister does not feel like this has improved to baseline, strongly wanting neurology referral for further evaluation. Will get neurology referral at families request   2. Insomnia  -will hold off on starting any new medications at this time due to recent changes in mental status.    Over 30 mins Time TOTAL:  time greater than 50% of total time spent doing pt counseled and coordination of care with pt, nursing and family member

## 2015-03-04 DIAGNOSIS — G4700 Insomnia, unspecified: Secondary | ICD-10-CM | POA: Diagnosis not present

## 2015-03-04 DIAGNOSIS — F329 Major depressive disorder, single episode, unspecified: Secondary | ICD-10-CM | POA: Diagnosis not present

## 2015-03-04 DIAGNOSIS — F411 Generalized anxiety disorder: Secondary | ICD-10-CM | POA: Diagnosis not present

## 2015-03-17 ENCOUNTER — Ambulatory Visit: Payer: Medicare Other | Admitting: Diagnostic Neuroimaging

## 2015-03-26 DIAGNOSIS — F329 Major depressive disorder, single episode, unspecified: Secondary | ICD-10-CM | POA: Diagnosis not present

## 2015-03-26 DIAGNOSIS — R41841 Cognitive communication deficit: Secondary | ICD-10-CM | POA: Diagnosis not present

## 2015-03-26 DIAGNOSIS — I69954 Hemiplegia and hemiparesis following unspecified cerebrovascular disease affecting left non-dominant side: Secondary | ICD-10-CM | POA: Diagnosis not present

## 2015-03-26 DIAGNOSIS — M216X9 Other acquired deformities of unspecified foot: Secondary | ICD-10-CM | POA: Diagnosis not present

## 2015-03-26 DIAGNOSIS — M24572 Contracture, left ankle: Secondary | ICD-10-CM | POA: Diagnosis not present

## 2015-03-29 DIAGNOSIS — R41841 Cognitive communication deficit: Secondary | ICD-10-CM | POA: Diagnosis not present

## 2015-03-29 DIAGNOSIS — G4700 Insomnia, unspecified: Secondary | ICD-10-CM | POA: Diagnosis not present

## 2015-03-29 DIAGNOSIS — M216X9 Other acquired deformities of unspecified foot: Secondary | ICD-10-CM | POA: Diagnosis not present

## 2015-03-29 DIAGNOSIS — F411 Generalized anxiety disorder: Secondary | ICD-10-CM | POA: Diagnosis not present

## 2015-03-29 DIAGNOSIS — F329 Major depressive disorder, single episode, unspecified: Secondary | ICD-10-CM | POA: Diagnosis not present

## 2015-03-29 DIAGNOSIS — I69954 Hemiplegia and hemiparesis following unspecified cerebrovascular disease affecting left non-dominant side: Secondary | ICD-10-CM | POA: Diagnosis not present

## 2015-03-29 DIAGNOSIS — M24572 Contracture, left ankle: Secondary | ICD-10-CM | POA: Diagnosis not present

## 2015-03-31 DIAGNOSIS — F329 Major depressive disorder, single episode, unspecified: Secondary | ICD-10-CM | POA: Diagnosis not present

## 2015-03-31 DIAGNOSIS — I69954 Hemiplegia and hemiparesis following unspecified cerebrovascular disease affecting left non-dominant side: Secondary | ICD-10-CM | POA: Diagnosis not present

## 2015-03-31 DIAGNOSIS — R41841 Cognitive communication deficit: Secondary | ICD-10-CM | POA: Diagnosis not present

## 2015-03-31 DIAGNOSIS — M216X9 Other acquired deformities of unspecified foot: Secondary | ICD-10-CM | POA: Diagnosis not present

## 2015-03-31 DIAGNOSIS — M24572 Contracture, left ankle: Secondary | ICD-10-CM | POA: Diagnosis not present

## 2015-04-05 ENCOUNTER — Encounter: Payer: Self-pay | Admitting: *Deleted

## 2015-04-05 DIAGNOSIS — M24572 Contracture, left ankle: Secondary | ICD-10-CM | POA: Diagnosis not present

## 2015-04-05 DIAGNOSIS — I69954 Hemiplegia and hemiparesis following unspecified cerebrovascular disease affecting left non-dominant side: Secondary | ICD-10-CM | POA: Diagnosis not present

## 2015-04-05 DIAGNOSIS — R41841 Cognitive communication deficit: Secondary | ICD-10-CM | POA: Diagnosis not present

## 2015-04-05 DIAGNOSIS — F329 Major depressive disorder, single episode, unspecified: Secondary | ICD-10-CM | POA: Diagnosis not present

## 2015-04-05 DIAGNOSIS — M216X9 Other acquired deformities of unspecified foot: Secondary | ICD-10-CM | POA: Diagnosis not present

## 2015-04-06 DIAGNOSIS — R41841 Cognitive communication deficit: Secondary | ICD-10-CM | POA: Diagnosis not present

## 2015-04-06 DIAGNOSIS — M216X9 Other acquired deformities of unspecified foot: Secondary | ICD-10-CM | POA: Diagnosis not present

## 2015-04-06 DIAGNOSIS — F329 Major depressive disorder, single episode, unspecified: Secondary | ICD-10-CM | POA: Diagnosis not present

## 2015-04-06 DIAGNOSIS — I69954 Hemiplegia and hemiparesis following unspecified cerebrovascular disease affecting left non-dominant side: Secondary | ICD-10-CM | POA: Diagnosis not present

## 2015-04-06 DIAGNOSIS — M24572 Contracture, left ankle: Secondary | ICD-10-CM | POA: Diagnosis not present

## 2015-04-07 DIAGNOSIS — R41841 Cognitive communication deficit: Secondary | ICD-10-CM | POA: Diagnosis not present

## 2015-04-07 DIAGNOSIS — M24572 Contracture, left ankle: Secondary | ICD-10-CM | POA: Diagnosis not present

## 2015-04-07 DIAGNOSIS — F329 Major depressive disorder, single episode, unspecified: Secondary | ICD-10-CM | POA: Diagnosis not present

## 2015-04-07 DIAGNOSIS — I69954 Hemiplegia and hemiparesis following unspecified cerebrovascular disease affecting left non-dominant side: Secondary | ICD-10-CM | POA: Diagnosis not present

## 2015-04-07 DIAGNOSIS — M216X9 Other acquired deformities of unspecified foot: Secondary | ICD-10-CM | POA: Diagnosis not present

## 2015-04-09 ENCOUNTER — Ambulatory Visit: Payer: Medicare Other | Admitting: Diagnostic Neuroimaging

## 2015-04-09 DIAGNOSIS — F329 Major depressive disorder, single episode, unspecified: Secondary | ICD-10-CM | POA: Diagnosis not present

## 2015-04-09 DIAGNOSIS — I69954 Hemiplegia and hemiparesis following unspecified cerebrovascular disease affecting left non-dominant side: Secondary | ICD-10-CM | POA: Diagnosis not present

## 2015-04-09 DIAGNOSIS — M216X9 Other acquired deformities of unspecified foot: Secondary | ICD-10-CM | POA: Diagnosis not present

## 2015-04-09 DIAGNOSIS — M24572 Contracture, left ankle: Secondary | ICD-10-CM | POA: Diagnosis not present

## 2015-04-09 DIAGNOSIS — R41841 Cognitive communication deficit: Secondary | ICD-10-CM | POA: Diagnosis not present

## 2015-04-12 ENCOUNTER — Encounter: Payer: Self-pay | Admitting: Diagnostic Neuroimaging

## 2015-04-12 DIAGNOSIS — R41841 Cognitive communication deficit: Secondary | ICD-10-CM | POA: Diagnosis not present

## 2015-04-12 DIAGNOSIS — F329 Major depressive disorder, single episode, unspecified: Secondary | ICD-10-CM | POA: Diagnosis not present

## 2015-04-13 ENCOUNTER — Non-Acute Institutional Stay (SKILLED_NURSING_FACILITY): Payer: Medicare Other | Admitting: Internal Medicine

## 2015-04-13 DIAGNOSIS — E1149 Type 2 diabetes mellitus with other diabetic neurological complication: Secondary | ICD-10-CM

## 2015-04-13 DIAGNOSIS — G4700 Insomnia, unspecified: Secondary | ICD-10-CM

## 2015-04-13 DIAGNOSIS — F329 Major depressive disorder, single episode, unspecified: Secondary | ICD-10-CM | POA: Insufficient documentation

## 2015-04-13 DIAGNOSIS — I69854 Hemiplegia and hemiparesis following other cerebrovascular disease affecting left non-dominant side: Secondary | ICD-10-CM

## 2015-04-13 DIAGNOSIS — K5900 Constipation, unspecified: Secondary | ICD-10-CM

## 2015-04-13 DIAGNOSIS — F322 Major depressive disorder, single episode, severe without psychotic features: Secondary | ICD-10-CM | POA: Diagnosis not present

## 2015-04-13 DIAGNOSIS — E785 Hyperlipidemia, unspecified: Secondary | ICD-10-CM

## 2015-04-13 DIAGNOSIS — I509 Heart failure, unspecified: Secondary | ICD-10-CM | POA: Diagnosis not present

## 2015-04-13 DIAGNOSIS — I639 Cerebral infarction, unspecified: Secondary | ICD-10-CM | POA: Diagnosis not present

## 2015-04-13 DIAGNOSIS — I10 Essential (primary) hypertension: Secondary | ICD-10-CM

## 2015-04-13 DIAGNOSIS — K5904 Chronic idiopathic constipation: Secondary | ICD-10-CM

## 2015-04-13 DIAGNOSIS — IMO0002 Reserved for concepts with insufficient information to code with codable children: Secondary | ICD-10-CM

## 2015-04-13 DIAGNOSIS — R300 Dysuria: Secondary | ICD-10-CM | POA: Diagnosis not present

## 2015-04-13 DIAGNOSIS — E1165 Type 2 diabetes mellitus with hyperglycemia: Principal | ICD-10-CM

## 2015-04-13 DIAGNOSIS — E1141 Type 2 diabetes mellitus with diabetic mononeuropathy: Secondary | ICD-10-CM | POA: Diagnosis not present

## 2015-04-13 DIAGNOSIS — I69354 Hemiplegia and hemiparesis following cerebral infarction affecting left non-dominant side: Secondary | ICD-10-CM

## 2015-04-13 NOTE — Progress Notes (Signed)
Patient ID: Susan [name], female   DOB: May 30, 1951, 64 y.o.   MRN: [id number]     [facility]  PCP: Blanchie Serve, MD  Code Status: DNR  Allergies  Allergen Reactions  . Onion Shortness Of Breath and Swelling  . Pollen Extract Itching  . Morphine And Related Hives    PT DENIES ALLERGY  . Niaspan [Niacin Er] Other (See Comments)    unknown  . Other     onions  . Valium [Diazepam] Other (See Comments)    unknown  . Wellbutrin [Bupropion] Other (See Comments)    unknown    Chief Complaint  Patient presents with  . Medical Management of Chronic Issues     HPI 64 y/o female patient is seen for routine visit. She complaints of discomfort and pain with urination for few days. She also complaints of occasional discomfort in her right upper abdomen area. She has hx of pancreatitis in past and wonders if this could be the same. Denies any nausea or vomiting. Appetite is good. Bowel movement is regular. She has trouble sleeping at night and has been feeling low lately. Denies suicidal ideation. 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   Review of Systems  Constitutional: Negative for fever, chills, diaphoresis.  HENT: Negative for congestion, hearing loss and sore throat.   Eyes: Negative for blurred vision, double vision and discharge.  Respiratory: Negative for cough, sputum production, shortness of breath and wheezing.   Cardiovascular: Negative for chest pain, palpitations,leg swelling.  Gastrointestinal: Negative for heartburn, nausea, vomiting   Genitourinary: positive for flank pain.  Musculoskeletal: Negative for falls Skin: Negative for rash.  Neurological: Negative for dizziness, tingling, headaches.  Psychiatric/Behavioral: history of depression and followed by psych services  Past Medical History  Diagnosis Date  . Hyperlipidemia   . Diabetes mellitus  without complication   . COPD (chronic obstructive pulmonary disease)   . Depression   . Hypertension   . Thyroid disease   . Anemia   . CAD (coronary artery disease)   . Chronic pain   . Hemiplegia affecting non-dominant side, post-stroke   . Fall   . Urine retention   . Stroke 12/2013    left side paralysis     Medication List       This list is accurate as of: 04/13/15  6:00 PM.  Always use your most recent med list.               aspirin 81 MG tablet  Take 81 mg by mouth daily.     baclofen 10 MG tablet  Commonly known as:  LIORESAL  Take 10 mg by mouth 3 (three) times daily.     clonazePAM 0.5 MG tablet  Commonly known as:  KLONOPIN  Take one tablet by mouth every 6 hours as needed for anxiety     cyclobenzaprine 5 MG tablet  Commonly known as:  FLEXERIL  Take 5 mg by mouth 2 (two) times daily.     docusate sodium 100 MG capsule  Commonly known as:  COLACE  Take 2 capsules (200 mg total) by mouth 2 (two) times daily.     Fish Oil 1000 MG Caps  Take 2 capsules by mouth daily.     fluticasone 50 MCG/ACT nasal spray  Commonly known as:  FLONASE  Place 2 sprays into both nostrils daily.  furosemide 40 MG tablet  Commonly known as:  LASIX  Take 40 mg by mouth daily.     glimepiride 2 MG tablet  Commonly known as:  AMARYL  Take 2 mg by mouth daily with breakfast.     guaiFENesin-dextromethorphan 100-10 MG/5ML syrup  Commonly known as:  ROBITUSSIN DM  Take 10 mLs by mouth every 6 (six) hours as needed for cough.     hydrOXYzine 25 MG tablet  Commonly known as:  ATARAX/VISTARIL  Take 25 mg by mouth every 8 (eight) hours as needed for itching.     insulin detemir 100 UNIT/ML injection  Commonly known as:  LEVEMIR  Inject 35-70 Units into the skin 2 (two) times daily. 30 units in the morning and 70 units at bedtime     insulin lispro 100 UNIT/ML injection  Commonly known as:  HUMALOG  Inject 0.15-0.25 mLs (15-25 Units total) into the skin 3 (three)  times daily before meals. 25 units prior to meals with an additional 15 units prior to meals for cbg >=150     isosorbide mononitrate 30 MG 24 hr tablet  Commonly known as:  IMDUR  Take 30 mg by mouth daily.     levothyroxine 25 MCG tablet  Commonly known as:  SYNTHROID, LEVOTHROID  Take 25 mcg by mouth daily before breakfast.     Linaclotide 145 MCG Caps capsule  Commonly known as:  LINZESS  Take 145 mcg by mouth daily.     linagliptin 5 MG Tabs tablet  Commonly known as:  TRADJENTA  Take 5 mg by mouth daily.     Melatonin 3 MG Tabs  Take 6 mg by mouth at bedtime.     metFORMIN 1000 MG tablet  Commonly known as:  GLUCOPHAGE  Take 1,000 mg by mouth 2 (two) times daily with a meal.     nitroGLYCERIN 0.4 MG SL tablet  Commonly known as:  NITROSTAT  Place 0.4 mg under the tongue every 5 (five) minutes as needed for chest pain.     oxyCODONE 15 MG immediate release tablet  Commonly known as:  ROXICODONE  Take one tablet by mouth 5 times daily at 5am,9am, 1pm,5pm 9pm for pain; Take one tablet by mouth as needed between 10pm-5am for breakthrough pain     pregabalin 200 MG capsule  Commonly known as:  LYRICA  Take 200 mg by mouth at bedtime.     promethazine 12.5 MG tablet  Commonly known as:  PHENERGAN  Take 12.5 mg by mouth every 6 (six) hours as needed for nausea or vomiting.     rosuvastatin 40 MG tablet  Commonly known as:  CRESTOR  Take 40 mg by mouth every evening.     sertraline 50 MG tablet  Commonly known as:  ZOLOFT  50 mg. 1 1/2 by mouth daily     simethicone 80 MG chewable tablet  Commonly known as:  MYLICON  Chew 161 mg by mouth 3 (three) times daily.     spironolactone 25 MG tablet  Commonly known as:  ALDACTONE  Take 25 mg by mouth 2 (two) times daily.     SYSTANE ULTRA OP  Place 1 drop into both eyes daily. And as needed     tiotropium 18 MCG inhalation capsule  Commonly known as:  SPIRIVA  Place 18 mcg into inhaler and inhale daily.        Physical exam BP 106/49 mmHg  Pulse 88  Temp(Src) 96.8 F (36 C)  Resp  18  Ht 5\' 3"  (1.6 m)  Wt 189 lb 6.4 oz (85.911 kg)  BMI 33.56 kg/m2  SpO2 93%  Wt Readings from Last 3 Encounters:  04/13/15 189 lb 6.4 oz (85.911 kg)  02/15/15 188 lb (85.276 kg)  01/11/15 189 lb 3.2 oz (85.821 kg)   General- elderly obese female in no acute distress Head- atraumatic, normocephalic Eyes- no pallor, no icterus, no discharge Neck- no lymphadenopathy Mouth- normal mucus membrane, upper and lower dentures Cardiovascular- normal s1,s2, no murmurs, normal distal pulses, no leg edema Respiratory- bilateral clear to auscultation, no wheeze, no rhonchi, no crackles Abdomen- bowel sounds present, firm, distended, some tenderness in RUQ, no epigastric tenderness, no guarding or rigidity, no CVA tenderness Musculoskeletal- able to move right sided extremities, left sided hemiplegia with left foot drop, no leg edema Neurological- no focal deficit Skin- warm and dry Psychiatry- alert and oriented to person, place and time, normal mood and affect    Labs  CBC Latest Ref Rng 08/30/2014 05/16/2014 04/17/2014  WBC 4.0 - 10.5 K/uL 8.5 6.7 8.1  Hemoglobin 12.0 - 15.0 g/dL 11.0(L) 11.2(L) 11.6(A)  Hematocrit 36.0 - 46.0 % 34.9(L) 36.0 38  Platelets 150 - 400 K/uL 264 264 269   CMP Latest Ref Rng 08/30/2014 08/17/2014 05/16/2014  Glucose 70 - 99 mg/dL 179(H) - 310(H)  BUN 6 - 23 mg/dL 17 17 24(H)  Creatinine 0.50 - 1.10 mg/dL 0.66 0.5 0.59  Sodium 137 - 147 mEq/L 135(L) 136(A) 137  Potassium 3.7 - 5.3 mEq/L 4.4 4.4 4.3  Chloride 96 - 112 mEq/L 95(L) - 94(L)  CO2 19 - 32 mEq/L 28 - 30  Calcium 8.4 - 10.5 mg/dL 8.8 - 9.1  Total Protein 6.0 - 8.3 g/dL - - 7.8  Total Bilirubin 0.3 - 1.2 mg/dL - - 0.2(L)  Alkaline Phos 39 - 117 U/L - - 96  AST 0 - 37 U/L - - 14  ALT 0 - 35 U/L - - 11   Lab Results  Component Value Date   HGBA1C 8.3* 12/25/2014   Lab Results  Component Value Date   TSH 2.50 09/16/2014    Lipid Panel     Component Value Date/Time   CHOL 191 08/13/2014   TRIG 144 08/13/2014   HDL 30* 08/13/2014   LDLCALC 132 08/13/2014   08/12/14 urine microalbumin/cr 12.6 04/19/15 wbc 8.3, hb 10.6, hct 33.6, plt 221, na 138, k 3.5, glu 174, bun 15, cr 0.72, tsh 2.631, ca 9   Assessment/plan  Dm type 2 Check a1c. Continue metformin 1000 mg bid with levemir 35 u in am and 70 u in pm. Continue humalog 25 u TID, tradjenta 5 mg daily and amaryl 2 mg daily with SSI. Dietary compliance reinforced. Continue statin and aspirin.   HTN bp on lower side, denies any symptom. Currently on isodril 30 mg daily, lasix 40 mg daily and aldactone 25 mg bid. Will have holding parameter for isodril and decrease lasix to 20 mg daily and monitor  Dysuria Send u/a with c/s and reassess  Insomnia Add melatonin to help regulate her sleep cycle. Avoid sedative/ hypnotic where possible  CHF Weight stable, decrease lasix to 20 mg daily. Continue current dosing of isodril and aldactone.   Chronic constipation Her limited mobility and pain medication are both contributing to this. Continue linzess with colace  Left hemiplegia post cva Continue flexeril 5 mg bid and baclofen 10 mg tid for muscle spasm  Hyperlipidemia Continue crestor 40 mg daily and check  lipid panel  Depression Continue zoloft 100 mg daily and will have psych review her medication and follow further    Blanchie Serve, MD  [facility] [phone] (Monday-Friday 8 am - 5 pm) [phone] (afterhours)

## 2015-04-14 DIAGNOSIS — L600 Ingrowing nail: Secondary | ICD-10-CM | POA: Diagnosis not present

## 2015-04-14 DIAGNOSIS — R41841 Cognitive communication deficit: Secondary | ICD-10-CM | POA: Diagnosis not present

## 2015-04-14 DIAGNOSIS — B351 Tinea unguium: Secondary | ICD-10-CM | POA: Diagnosis not present

## 2015-04-14 DIAGNOSIS — E1159 Type 2 diabetes mellitus with other circulatory complications: Secondary | ICD-10-CM | POA: Diagnosis not present

## 2015-04-14 DIAGNOSIS — R300 Dysuria: Secondary | ICD-10-CM | POA: Diagnosis not present

## 2015-04-14 DIAGNOSIS — M79674 Pain in right toe(s): Secondary | ICD-10-CM | POA: Diagnosis not present

## 2015-04-14 DIAGNOSIS — F329 Major depressive disorder, single episode, unspecified: Secondary | ICD-10-CM | POA: Diagnosis not present

## 2015-04-16 DIAGNOSIS — F329 Major depressive disorder, single episode, unspecified: Secondary | ICD-10-CM | POA: Diagnosis not present

## 2015-04-16 DIAGNOSIS — R41841 Cognitive communication deficit: Secondary | ICD-10-CM | POA: Diagnosis not present

## 2015-04-19 DIAGNOSIS — F329 Major depressive disorder, single episode, unspecified: Secondary | ICD-10-CM | POA: Diagnosis not present

## 2015-04-19 DIAGNOSIS — R41841 Cognitive communication deficit: Secondary | ICD-10-CM | POA: Diagnosis not present

## 2015-04-20 DIAGNOSIS — F329 Major depressive disorder, single episode, unspecified: Secondary | ICD-10-CM | POA: Diagnosis not present

## 2015-04-20 DIAGNOSIS — R41841 Cognitive communication deficit: Secondary | ICD-10-CM | POA: Diagnosis not present

## 2015-04-21 ENCOUNTER — Ambulatory Visit: Payer: Medicare Other | Admitting: *Deleted

## 2015-04-21 DIAGNOSIS — M21379 Foot drop, unspecified foot: Secondary | ICD-10-CM

## 2015-04-21 DIAGNOSIS — R2689 Other abnormalities of gait and mobility: Principal | ICD-10-CM

## 2015-04-21 DIAGNOSIS — F329 Major depressive disorder, single episode, unspecified: Secondary | ICD-10-CM | POA: Diagnosis not present

## 2015-04-21 DIAGNOSIS — R41841 Cognitive communication deficit: Secondary | ICD-10-CM | POA: Diagnosis not present

## 2015-04-21 NOTE — Progress Notes (Signed)
Patient ID: Susan [name], female   DOB: 10-07-1950, 64 y.o.   MRN: [id number] Patient presents for brace casting with [facility].  Patient had identical cast made a year ago she is non-ambulatory.  Patient was not casted as it would not be covered by medicare.

## 2015-04-22 DIAGNOSIS — R41841 Cognitive communication deficit: Secondary | ICD-10-CM | POA: Diagnosis not present

## 2015-04-22 DIAGNOSIS — F329 Major depressive disorder, single episode, unspecified: Secondary | ICD-10-CM | POA: Diagnosis not present

## 2015-04-23 DIAGNOSIS — F329 Major depressive disorder, single episode, unspecified: Secondary | ICD-10-CM | POA: Diagnosis not present

## 2015-04-23 DIAGNOSIS — R41841 Cognitive communication deficit: Secondary | ICD-10-CM | POA: Diagnosis not present

## 2015-04-26 ENCOUNTER — Encounter: Payer: Self-pay | Admitting: Internal Medicine

## 2015-04-26 ENCOUNTER — Non-Acute Institutional Stay (SKILLED_NURSING_FACILITY): Payer: Medicare Other | Admitting: Internal Medicine

## 2015-04-26 DIAGNOSIS — F329 Major depressive disorder, single episode, unspecified: Secondary | ICD-10-CM | POA: Diagnosis not present

## 2015-04-26 DIAGNOSIS — M1990 Unspecified osteoarthritis, unspecified site: Secondary | ICD-10-CM | POA: Diagnosis not present

## 2015-04-26 DIAGNOSIS — R41841 Cognitive communication deficit: Secondary | ICD-10-CM | POA: Diagnosis not present

## 2015-04-26 DIAGNOSIS — Z79899 Other long term (current) drug therapy: Secondary | ICD-10-CM | POA: Diagnosis not present

## 2015-04-26 DIAGNOSIS — J069 Acute upper respiratory infection, unspecified: Secondary | ICD-10-CM | POA: Diagnosis not present

## 2015-04-26 DIAGNOSIS — G894 Chronic pain syndrome: Secondary | ICD-10-CM | POA: Diagnosis not present

## 2015-04-26 DIAGNOSIS — M797 Fibromyalgia: Secondary | ICD-10-CM | POA: Diagnosis not present

## 2015-04-26 DIAGNOSIS — G629 Polyneuropathy, unspecified: Secondary | ICD-10-CM | POA: Diagnosis not present

## 2015-04-26 NOTE — Progress Notes (Signed)
Patient ID: Susan [name], female   DOB: Jun 26, 1951, 64 y.o.   MRN: [id number]   [facility]  Code Status: DNR   Chief Complaint  Patient presents with  . Acute Visit    cough, earache    Allergies  Allergen Reactions  . Onion Shortness Of Breath and Swelling  . Pollen Extract Itching  . Morphine And Related Hives    PT DENIES ALLERGY  . Niaspan [Niacin Er] Other (See Comments)    unknown  . Other     onions  . Valium [Diazepam] Other (See Comments)    unknown  . Wellbutrin [Bupropion] Other (See Comments)    unknown    HPI 64 y/o female patient is seen for acute visit. She complaints of discomfort in her left ear. Denies ear discharge. she has cough with yellow phlegm and discomfort in her throat. Denies fever or chills. Denies runny nose or sore throat. No difficulty swallowing but has ear ache with swallowing. 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   Review of Systems  Constitutional: Negative for fever, chills, diaphoresis.   Eyes: Negative for blurred vision, double vision and discharge.  Respiratory: Negative for shortness of breath and wheezing.   Cardiovascular: Negative for chest pain, palpitations,leg swelling.  Gastrointestinal: Negative for heartburn, nausea, vomiting   Skin: Negative for rash.  Neurological: Negative for dizziness, tingling, headaches.   Past Medical History  Diagnosis Date  . Hyperlipidemia   . Diabetes mellitus without complication   . COPD (chronic obstructive pulmonary disease)   . Depression   . Hypertension   . Thyroid disease   . Anemia   . CAD (coronary artery disease)   . Chronic pain   . Hemiplegia affecting non-dominant side, post-stroke   . Fall   . Urine retention   . Stroke 12/2013    left side paralysis       Medication List       This list is accurate as of: 04/26/15 11:59 PM.  Always use your most  recent med list.               aspirin 81 MG tablet  Take 81 mg by mouth daily.     baclofen 10 MG tablet  Commonly known as:  LIORESAL  Take 10 mg by mouth 3 (three) times daily.     clonazePAM 0.5 MG tablet  Commonly known as:  KLONOPIN  Take one tablet by mouth every 6 hours as needed for anxiety     cyclobenzaprine 5 MG tablet  Commonly known as:  FLEXERIL  Take 5 mg by mouth 2 (two) times daily.     docusate sodium 100 MG capsule  Commonly known as:  COLACE  Take 2 capsules (200 mg total) by mouth 2 (two) times daily.     Fish Oil 1000 MG Caps  Take 2 capsules by mouth daily.     fluticasone 50 MCG/ACT nasal spray  Commonly known as:  FLONASE  Place 2 sprays into both nostrils daily.     furosemide 40 MG tablet  Commonly known as:  LASIX  Take 40 mg by mouth daily.     glimepiride 2 MG tablet  Commonly known as:  AMARYL  Take 2 mg by mouth daily with breakfast.     guaiFENesin-dextromethorphan 100-10 MG/5ML syrup  Commonly known as:  ROBITUSSIN DM  Take 10 mLs by  mouth every 6 (six) hours as needed for cough.     hydrOXYzine 25 MG tablet  Commonly known as:  ATARAX/VISTARIL  Take 25 mg by mouth every 8 (eight) hours as needed for itching.     insulin detemir 100 UNIT/ML injection  Commonly known as:  LEVEMIR  Inject 35-70 Units into the skin 2 (two) times daily. 35 units in the morning and 70 units at bedtime     insulin lispro 100 UNIT/ML injection  Commonly known as:  HUMALOG  25 units prior to meals scheduled, inject per sliding scale insulin 150-200= 5 units, 201-250= 8 units, 251-300= 10 units     isosorbide mononitrate 30 MG 24 hr tablet  Commonly known as:  IMDUR  Take 30 mg by mouth daily.     levothyroxine 25 MCG tablet  Commonly known as:  SYNTHROID, LEVOTHROID  Take 25 mcg by mouth daily before breakfast.     Linaclotide 145 MCG Caps capsule  Commonly known as:  LINZESS  Take 145 mcg by mouth daily.     linagliptin 5 MG Tabs tablet    Commonly known as:  TRADJENTA  Take 5 mg by mouth daily.     Melatonin 3 MG Tabs  Take 6 mg by mouth at bedtime.     metFORMIN 1000 MG tablet  Commonly known as:  GLUCOPHAGE  Take 1,000 mg by mouth 2 (two) times daily with a meal.     nitroGLYCERIN 0.4 MG SL tablet  Commonly known as:  NITROSTAT  Place 0.4 mg under the tongue every 5 (five) minutes as needed for chest pain.     oxyCODONE 15 MG immediate release tablet  Commonly known as:  ROXICODONE  Take one tablet by mouth 5 times daily at 5am,9am, 1pm,5pm 9pm for pain; Take one tablet by mouth as needed between 10pm-5am for breakthrough pain     pregabalin 200 MG capsule  Commonly known as:  LYRICA  Take 200 mg by mouth at bedtime.     promethazine 12.5 MG tablet  Commonly known as:  PHENERGAN  Take 12.5 mg by mouth every 6 (six) hours as needed for nausea or vomiting.     rosuvastatin 40 MG tablet  Commonly known as:  CRESTOR  Take 40 mg by mouth every evening.     sertraline 100 MG tablet  Commonly known as:  ZOLOFT  Take 100 mg by mouth daily.     simethicone 80 MG chewable tablet  Commonly known as:  MYLICON  Chew 086 mg by mouth 3 (three) times daily.     spironolactone 25 MG tablet  Commonly known as:  ALDACTONE  Take 25 mg by mouth 2 (two) times daily.     SYSTANE ULTRA OP  Place 1 drop into both eyes at bedtime. And as needed     tiotropium 18 MCG inhalation capsule  Commonly known as:  SPIRIVA  Place 18 mcg into inhaler and inhale daily.        Physical exam BP 117/65 mmHg  Pulse 79  Temp(Src) 96.7 F (35.9 C) (Oral)  Resp 19  Ht 5\' 3"  (1.6 m)  Wt 191 lb 3.2 oz (86.728 kg)  BMI 33.88 kg/m2  SpO2 97%  General- elderly obese female in no acute distress Head- atraumatic, normocephalic Eyes- no pallor, no icterus, no discharge Neck- no cervical lymphadenopathy Nose- no sinus tenderness, no nasal drainage Mouth- normal mucus membrane, upper and lower dentures, mild oropharyngeal erythema,  no exudates Ear- normal external  ear canal Cardiovascular- normal s1,s2, no murmurs, normal distal pulses, no leg edema Respiratory- bilateral clear to auscultation, no wheeze, no rhonchi, no crackles Skin- warm and dry   Assessment/plan  Acute upper respiratory tract infection Start azithromycin for 5 days, mucinex 10 cc tid x 5 days and reassess. If no improvement, will get lab work to assess further.   Blanchie Serve, MD  [facility] [phone] (Monday-Friday 8 am - 5 pm) [phone] (afterhours)

## 2015-05-19 ENCOUNTER — Other Ambulatory Visit: Payer: Self-pay

## 2015-05-19 MED ORDER — CLONAZEPAM 0.5 MG PO TABS: ORAL_TABLET | ORAL | Status: DC

## 2015-05-19 NOTE — Telephone Encounter (Signed)
Rx faxed to [facility] @ [phone], phone number [phone]  

## 2015-05-26 ENCOUNTER — Non-Acute Institutional Stay (SKILLED_NURSING_FACILITY): Payer: Medicare Other | Admitting: Nurse Practitioner

## 2015-05-26 DIAGNOSIS — E1165 Type 2 diabetes mellitus with hyperglycemia: Secondary | ICD-10-CM | POA: Diagnosis not present

## 2015-05-26 DIAGNOSIS — K5904 Chronic idiopathic constipation: Secondary | ICD-10-CM

## 2015-05-26 DIAGNOSIS — M792 Neuralgia and neuritis, unspecified: Secondary | ICD-10-CM | POA: Diagnosis not present

## 2015-05-26 DIAGNOSIS — F419 Anxiety disorder, unspecified: Secondary | ICD-10-CM | POA: Diagnosis not present

## 2015-05-26 DIAGNOSIS — I509 Heart failure, unspecified: Secondary | ICD-10-CM | POA: Diagnosis not present

## 2015-05-26 DIAGNOSIS — K5900 Constipation, unspecified: Secondary | ICD-10-CM | POA: Diagnosis not present

## 2015-05-26 DIAGNOSIS — I69354 Hemiplegia and hemiparesis following cerebral infarction affecting left non-dominant side: Secondary | ICD-10-CM

## 2015-05-26 DIAGNOSIS — I69854 Hemiplegia and hemiparesis following other cerebrovascular disease affecting left non-dominant side: Secondary | ICD-10-CM | POA: Diagnosis not present

## 2015-05-26 DIAGNOSIS — IMO0002 Reserved for concepts with insufficient information to code with codable children: Secondary | ICD-10-CM

## 2015-05-26 NOTE — Progress Notes (Signed)
Patient ID: Susan [name], female   DOB: Nov 26, 1950, 64 y.o.   MRN: [id number]    Nursing Home Location:  [place] of Service: SNF (31)  PCP: Blanchie Serve, MD  Allergies  Allergen Reactions  . Onion Shortness Of Breath and Swelling  . Pollen Extract Itching  . Morphine And Related Hives    PT DENIES ALLERGY  . Niaspan [Niacin Er] Other (See Comments)    unknown  . Other     onions  . Valium [Diazepam] Other (See Comments)    unknown  . Wellbutrin [Bupropion] Other (See Comments)    unknown    Chief Complaint  Patient presents with  . Medical Management of Chronic Issues    HPI:  Patient is a 64 y.o. female seen today at [facility] for medical management of her chronic issues.  She has a past medical history of DM, COPD, HTN, chronic pain and CAD.  She currently is being treated for a ringworm with clotrimazole cream.  She says she is in pain most of the day from her chronic pain, she sees pain management for this.  She is also concerned about her sleep, she says she can fall asleep but wakes up at 4 am and cannot go back to sleep.  Nursing reports pt is sleeping well. Pt on multiple medications for pain, requesting frequently. Constantly refusing to get out of bed. Eating well.   Review of Systems:  Review of Systems  Constitutional: Negative for fever, chills and fatigue.  HENT: Negative for congestion, sneezing and sore throat.   Eyes: Negative.   Respiratory: Negative for cough, shortness of breath and wheezing.   Cardiovascular: Negative for chest pain, palpitations and leg swelling.  Gastrointestinal: Negative for nausea, diarrhea and constipation.  Genitourinary: Negative.   Musculoskeletal: Positive for myalgias. Negative for back pain and joint swelling.       Reports neuropathic pain, chronic pain, fibromyalgia   Skin:       Ringworm on left arm and face  Psychiatric/Behavioral: Positive for sleep disturbance.     Past Medical History  Diagnosis Date  . Hyperlipidemia   . Diabetes mellitus without complication   . COPD (chronic obstructive pulmonary disease)   . Depression   . Hypertension   . Thyroid disease   . Anemia   . CAD (coronary artery disease)   . Chronic pain   . Hemiplegia affecting non-dominant side, post-stroke   . Fall   . Urine retention   . Stroke 12/2013    left side paralysis   Past Surgical History  Procedure Laterality Date  . Joint replacement      bil hip  . Abdominal hysterectomy    . Cholecystectomy    . Appendectomy    . Tonsillectomy     Social History:   reports that she has quit smoking. Her smoking use included Cigarettes. She does not have any smokeless tobacco history on file. She reports that she does not drink alcohol or use illicit drugs.  No family history on file.  Medications: Patient's Medications  New Prescriptions   No medications on file  Previous Medications   ASPIRIN 81 MG TABLET    Take 81 mg by mouth daily.   BACLOFEN (LIORESAL) 10 MG TABLET    Take 10 mg by mouth 3 (three) times daily.    CLONAZEPAM (KLONOPIN) 0.5 MG TABLET    Take one tablet by mouth every 6 hours  as needed for anxiety   CYCLOBENZAPRINE (FLEXERIL) 5 MG TABLET    Take 5 mg by mouth 2 (two) times daily.   DOCUSATE SODIUM (COLACE) 100 MG CAPSULE    Take 2 capsules (200 mg total) by mouth 2 (two) times daily.   FLUTICASONE (FLONASE) 50 MCG/ACT NASAL SPRAY    Place 2 sprays into both nostrils daily.   FUROSEMIDE (LASIX) 40 MG TABLET    Take 40 mg by mouth daily.   GLIMEPIRIDE (AMARYL) 2 MG TABLET    Take 2 mg by mouth daily with breakfast.   GUAIFENESIN-DEXTROMETHORPHAN (ROBITUSSIN DM) 100-10 MG/5ML SYRUP    Take 10 mLs by mouth every 6 (six) hours as needed for cough.   HYDROXYZINE (ATARAX/VISTARIL) 25 MG TABLET    Take 25 mg by mouth every 8 (eight) hours as needed for itching.   INSULIN DETEMIR (LEVEMIR) 100 UNIT/ML INJECTION    Inject 35-70 Units into the skin 2  (two) times daily. 35 units in the morning and 70 units at bedtime   INSULIN LISPRO (HUMALOG) 100 UNIT/ML INJECTION    25 units prior to meals scheduled, inject per sliding scale insulin 150-200= 5 units, 201-250= 8 units, 251-300= 10 units   ISOSORBIDE MONONITRATE (IMDUR) 30 MG 24 HR TABLET    Take 30 mg by mouth daily.   LEVOTHYROXINE (SYNTHROID, LEVOTHROID) 25 MCG TABLET    Take 25 mcg by mouth daily before breakfast.   LINACLOTIDE (LINZESS) 145 MCG CAPS CAPSULE    Take 145 mcg by mouth daily.   LINAGLIPTIN (TRADJENTA) 5 MG TABS TABLET    Take 5 mg by mouth daily.   MELATONIN 3 MG TABS    Take 6 mg by mouth at bedtime.   METFORMIN (GLUCOPHAGE) 1000 MG TABLET    Take 1,000 mg by mouth 2 (two) times daily with a meal.   NITROGLYCERIN (NITROSTAT) 0.4 MG SL TABLET    Place 0.4 mg under the tongue every 5 (five) minutes as needed for chest pain.   OMEGA-3 FATTY ACIDS (FISH OIL) 1000 MG CAPS    Take 2 capsules by mouth daily.   OXYCODONE (ROXICODONE) 15 MG IMMEDIATE RELEASE TABLET    Take one tablet by mouth 5 times daily at 5am,9am, 1pm,5pm 9pm for pain; Take one tablet by mouth as needed between 10pm-5am for breakthrough pain   POLYETHYL GLYCOL-PROPYL GLYCOL (SYSTANE ULTRA OP)    Place 1 drop into both eyes at bedtime. And as needed   PREGABALIN (LYRICA) 200 MG CAPSULE    Take 200 mg by mouth at bedtime.   PROMETHAZINE (PHENERGAN) 12.5 MG TABLET    Take 12.5 mg by mouth every 6 (six) hours as needed for nausea or vomiting.   ROSUVASTATIN (CRESTOR) 40 MG TABLET    Take 40 mg by mouth every evening.    SERTRALINE (ZOLOFT) 100 MG TABLET    Take 100 mg by mouth daily.   SIMETHICONE (MYLICON) 80 MG CHEWABLE TABLET    Chew 160 mg by mouth 3 (three) times daily.    SPIRONOLACTONE (ALDACTONE) 25 MG TABLET    Take 25 mg by mouth 2 (two) times daily.    TIOTROPIUM (SPIRIVA) 18 MCG INHALATION CAPSULE    Place 18 mcg into inhaler and inhale daily.  Modified Medications   No medications on file  Discontinued  Medications   No medications on file     Physical Exam: Filed Vitals:   05/26/15 1328  BP: 119/71  Pulse: 88  Temp: 97.4 F (  36.3 C)  TempSrc: Oral  Resp: 20  Weight: 192 lb (87.091 kg)    Physical Exam  Constitutional: She is oriented to person, place, and time. She appears well-developed and well-nourished.  HENT:  Head: Normocephalic and atraumatic.  Eyes: Pupils are equal, round, and reactive to light.  Neck: Normal range of motion. Neck supple.  Cardiovascular: Normal rate, regular rhythm and normal heart sounds.   Pulmonary/Chest: Effort normal and breath sounds normal.  Abdominal: Soft. Bowel sounds are normal.  Musculoskeletal: Normal range of motion.  Lymphadenopathy:    She has no cervical adenopathy.  Neurological: She is alert and oriented to person, place, and time.  Skin: Skin is warm and dry.  Ringworm present, appears to be dry and healing    Labs reviewed: Basic Metabolic Panel:  Recent Labs  08/17/14 08/30/14 2316 02/17/15  NA 136* 135* 138  K 4.4 4.4 3.5  CL  --  95*  --   CO2  --  28  --   GLUCOSE  --  179*  --   BUN 17 17 15   CREATININE 0.5 0.66 0.7  CALCIUM  --  8.8  --    Liver Function Tests:  Recent Labs  02/17/15  AST 13  ALT 12  ALKPHOS 94   No results for input(s): LIPASE, AMYLASE in the last 8760 hours. No results for input(s): AMMONIA in the last 8760 hours. CBC:  Recent Labs  08/30/14 2316 02/17/15  WBC 8.5 8.3  NEUTROABS 5.9  --   HGB 11.0* 10.6*  HCT 34.9* 34*  MCV 83.7  --   PLT 264 221   TSH:  Recent Labs  09/16/14 02/17/15  TSH 2.50 2.61   A1C: Lab Results  Component Value Date   HGBA1C 8.3* 12/25/2014   Lipid Panel:  Recent Labs  08/13/14  CHOL 191  HDL 30*  LDLCALC 132  TRIG 144  08/12/14 urine microalbumin/cr 12.6 04/19/15 wbc 8.3, hb 10.6, hct 33.6, plt 221, na 138, k 3.5, glu 174, bun 15, cr 0.72, tsh 2.631, ca 9   Assessment/Plan 1. CHF, stage C, unspecified failure chronicity,  unspecified type Stable.  No JVD, shortness of breath.  Continue furosemide.  BUN/Cr stable in august.   2. Chronic idiopathic constipation Stable.  Patient having BM's.  Continue Linzess and colace.  Encouraged patient to get out of bed and increase her mobility.   3. Neuropathic pain syndrome (non-herpetic) Stable.  Managed by pain management. Will cont current regimen.   4. Uncontrolled diabetes mellitus Not controlled. Will order A1C to follow up.  Preprandial CBG this month are all over 200, with some readings over 300.  Getting levemir 35 units q am and 70 units at bedtime. Will Change to Tujeo 105 units q hs and cont to monitor, will most likely need further titration of this.  Also to Continue humalog 25 u TID, tradjenta 5 mg daily and amaryl 2 mg daily   5. Anxiety Stable.  Continue zoloft 100 mg daily and klonopin.    6. Insomnia Patient already receiving multiple sedating meds.  Encouraged patient to become more active during the day to enhance night time rest.    7. left hemiplegia post cva Continue flexeril 5 mg bid and baclofen 10 mg tid for muscle spasm  Atasha Colebank K. Harle Battiest  [facility] [phone] 8 am - 5 pm) [phone] (after hours)

## 2015-05-27 DIAGNOSIS — E1165 Type 2 diabetes mellitus with hyperglycemia: Secondary | ICD-10-CM | POA: Diagnosis not present

## 2015-05-27 LAB — HEMOGLOBIN A1C: Hgb A1c MFr Bld: 8.7 % — AB (ref 4.0–6.0)

## 2015-06-07 ENCOUNTER — Non-Acute Institutional Stay (SKILLED_NURSING_FACILITY): Payer: Medicare Other | Admitting: Nurse Practitioner

## 2015-06-07 DIAGNOSIS — F411 Generalized anxiety disorder: Secondary | ICD-10-CM | POA: Diagnosis not present

## 2015-06-07 DIAGNOSIS — G4700 Insomnia, unspecified: Secondary | ICD-10-CM | POA: Diagnosis not present

## 2015-06-07 DIAGNOSIS — E1165 Type 2 diabetes mellitus with hyperglycemia: Secondary | ICD-10-CM

## 2015-06-07 DIAGNOSIS — F329 Major depressive disorder, single episode, unspecified: Secondary | ICD-10-CM | POA: Diagnosis not present

## 2015-06-07 DIAGNOSIS — IMO0002 Reserved for concepts with insufficient information to code with codable children: Secondary | ICD-10-CM

## 2015-06-07 NOTE — Progress Notes (Signed)
Patient ID: Susan [name], female   DOB: Sep 19, 1950, 64 y.o.   MRN: [id number]    Nursing Home Location:  [place] of Service: SNF (31)  PCP: Blanchie Serve, MD  Allergies  Allergen Reactions  . Onion Shortness Of Breath and Swelling  . Pollen Extract Itching  . Morphine And Related Hives    PT DENIES ALLERGY  . Niaspan [Niacin Er] Other (See Comments)    unknown  . Other     onions  . Valium [Diazepam] Other (See Comments)    unknown  . Wellbutrin [Bupropion] Other (See Comments)    unknown    Chief Complaint  Patient presents with  . Follow-up    diabetes, blood sugars, medication change    HPI:  Patient is a 64 y.o. female seen today at [facility] for follow up of her blood sugars.  She was recently started on Toujeo and levemir BID dosing was stopped due to uncontrolled diabetes.  Her blood sugars continue to be in the 200-300 range in the mornings.  Nursing has reported in the past that the patient eats throughout the night therefore are not truly fasting.  She continues sliding scale insulin for meal coverage.post prandial blood sugars are in the 300s.   Review of Systems:  Review of Systems  Constitutional: Positive for fatigue (Says she does not sleep well). Negative for fever and chills.  HENT: Negative.   Respiratory: Negative for cough, shortness of breath and wheezing.   Cardiovascular: Negative for chest pain, palpitations and leg swelling.  Gastrointestinal: Negative for diarrhea, constipation and abdominal distention.  Genitourinary: Negative for dysuria, frequency and difficulty urinating.  Musculoskeletal: Positive for back pain (Chronic pain ). Negative for myalgias, arthralgias and neck pain.  Skin: Negative.     Past Medical History  Diagnosis Date  . Hyperlipidemia   . Diabetes mellitus without complication   . COPD (chronic obstructive pulmonary disease)   . Depression   . Hypertension   . Thyroid  disease   . Anemia   . CAD (coronary artery disease)   . Chronic pain   . Hemiplegia affecting non-dominant side, post-stroke   . Fall   . Urine retention   . Stroke 12/2013    left side paralysis   Past Surgical History  Procedure Laterality Date  . Joint replacement      bil hip  . Abdominal hysterectomy    . Cholecystectomy    . Appendectomy    . Tonsillectomy      Medications: Patient's Medications  New Prescriptions   No medications on file  Previous Medications   ASPIRIN 81 MG TABLET    Take 81 mg by mouth daily.   BACLOFEN (LIORESAL) 10 MG TABLET    Take 10 mg by mouth 3 (three) times daily.    CLONAZEPAM (KLONOPIN) 0.5 MG TABLET    Take one tablet by mouth every 6 hours as needed for anxiety   CYCLOBENZAPRINE (FLEXERIL) 5 MG TABLET    Take 5 mg by mouth 2 (two) times daily.   DOCUSATE SODIUM (COLACE) 100 MG CAPSULE    Take 2 capsules (200 mg total) by mouth 2 (two) times daily.   FLUTICASONE (FLONASE) 50 MCG/ACT NASAL SPRAY    Place 2 sprays into both nostrils daily.   FUROSEMIDE (LASIX) 40 MG TABLET    Take 40 mg by mouth daily.   GLIMEPIRIDE (AMARYL) 2 MG TABLET    Take 2  mg by mouth daily with breakfast.   GUAIFENESIN-DEXTROMETHORPHAN (ROBITUSSIN DM) 100-10 MG/5ML SYRUP    Take 10 mLs by mouth every 6 (six) hours as needed for cough.   HYDROXYZINE (ATARAX/VISTARIL) 25 MG TABLET    Take 25 mg by mouth every 8 (eight) hours as needed for itching.   INSULIN GLARGINE (TOUJEO SOLOSTAR) 300 UNIT/ML SOPN    Inject 105 Units into the skin.   INSULIN LISPRO (HUMALOG) 100 UNIT/ML INJECTION    25 units prior to meals scheduled, inject per sliding scale insulin 150-200= 5 units, 201-250= 8 units, 251-300= 10 units   ISOSORBIDE MONONITRATE (IMDUR) 30 MG 24 HR TABLET    Take 30 mg by mouth daily.   LEVOTHYROXINE (SYNTHROID, LEVOTHROID) 25 MCG TABLET    Take 25 mcg by mouth daily before breakfast.   LINACLOTIDE (LINZESS) 145 MCG CAPS CAPSULE    Take 145 mcg by mouth daily.    LINAGLIPTIN (TRADJENTA) 5 MG TABS TABLET    Take 5 mg by mouth daily.   MELATONIN 3 MG TABS    Take 6 mg by mouth at bedtime.   METFORMIN (GLUCOPHAGE) 1000 MG TABLET    Take 1,000 mg by mouth 2 (two) times daily with a meal.   NITROGLYCERIN (NITROSTAT) 0.4 MG SL TABLET    Place 0.4 mg under the tongue every 5 (five) minutes as needed for chest pain.   OMEGA-3 FATTY ACIDS (FISH OIL) 1000 MG CAPS    Take 2 capsules by mouth daily.   OXYCODONE (ROXICODONE) 15 MG IMMEDIATE RELEASE TABLET    Take one tablet by mouth 5 times daily at 5am,9am, 1pm,5pm 9pm for pain; Take one tablet by mouth as needed between 10pm-5am for breakthrough pain   POLYETHYL GLYCOL-PROPYL GLYCOL (SYSTANE ULTRA OP)    Place 1 drop into both eyes at bedtime. And as needed   PREGABALIN (LYRICA) 200 MG CAPSULE    Take 200 mg by mouth at bedtime.   PROMETHAZINE (PHENERGAN) 12.5 MG TABLET    Take 12.5 mg by mouth every 6 (six) hours as needed for nausea or vomiting.   ROSUVASTATIN (CRESTOR) 40 MG TABLET    Take 40 mg by mouth every evening.    SERTRALINE (ZOLOFT) 100 MG TABLET    Take 100 mg by mouth daily.   SIMETHICONE (MYLICON) 80 MG CHEWABLE TABLET    Chew 160 mg by mouth 3 (three) times daily.    SPIRONOLACTONE (ALDACTONE) 25 MG TABLET    Take 25 mg by mouth 2 (two) times daily.    TIOTROPIUM (SPIRIVA) 18 MCG INHALATION CAPSULE    Place 18 mcg into inhaler and inhale daily.  Modified Medications   No medications on file  Discontinued Medications   INSULIN DETEMIR (LEVEMIR) 100 UNIT/ML INJECTION    Inject 35-70 Units into the skin 2 (two) times daily. 35 units in the morning and 70 units at bedtime     Physical Exam: Filed Vitals:   06/07/15 1306  BP: 128/67  Pulse: 85  Temp: 98.3 F (36.8 C)  Resp: 20    Physical Exam  Constitutional: She is oriented to person, place, and time. No distress.  Obese female   HENT:  Head: Normocephalic and atraumatic.  Eyes: Pupils are equal, round, and reactive to light.  Neck:  Normal range of motion. Neck supple.  Cardiovascular: Normal rate, regular rhythm and normal heart sounds.   Pulmonary/Chest: Effort normal and breath sounds normal. She has no wheezes.  Abdominal: Soft. Bowel sounds are  normal. She exhibits no distension.  Musculoskeletal: Normal range of motion.  Lymphadenopathy:    She has no cervical adenopathy.  Neurological: She is alert and oriented to person, place, and time.  Skin: Skin is warm and dry. She is not diaphoretic.  Psychiatric: She has a normal mood and affect.    Labs reviewed: Basic Metabolic Panel:  Recent Labs  08/17/14 08/30/14 2316 02/17/15  NA 136* 135* 138  K 4.4 4.4 3.5  CL  --  95*  --   CO2  --  28  --   GLUCOSE  --  179*  --   BUN 17 17 15   CREATININE 0.5 0.66 0.7  CALCIUM  --  8.8  --    Liver Function Tests:  Recent Labs  02/17/15  AST 13  ALT 12  ALKPHOS 94   No results for input(s): LIPASE, AMYLASE in the last 8760 hours. No results for input(s): AMMONIA in the last 8760 hours. CBC:  Recent Labs  08/30/14 2316 02/17/15  WBC 8.5 8.3  NEUTROABS 5.9  --   HGB 11.0* 10.6*  HCT 34.9* 34*  MCV 83.7  --   PLT 264 221   TSH:  Recent Labs  09/16/14 02/17/15  TSH 2.50 2.61   A1C: Lab Results  Component Value Date   HGBA1C 8.3* 12/25/2014   Lipid Panel:  Recent Labs  08/13/14  CHOL 191  HDL 30*  LDLCALC 132  TRIG 144     Assessment/Plan 1. Uncontrolled diabetes mellitus Blood sugars remain elevated, no hypoglycemia noted on review of blood sugars. Will Increase Toujeo to 110 units hs.  Continue sliding scale insulin. Reinforced importance of low carb diet.       Carlos American. Harle Battiest  [facility] [phone] 8 am - 5 pm) [phone] (after hours)'

## 2015-06-16 DIAGNOSIS — M545 Low back pain: Secondary | ICD-10-CM | POA: Diagnosis not present

## 2015-06-16 DIAGNOSIS — G894 Chronic pain syndrome: Secondary | ICD-10-CM | POA: Diagnosis not present

## 2015-06-16 DIAGNOSIS — M792 Neuralgia and neuritis, unspecified: Secondary | ICD-10-CM | POA: Diagnosis not present

## 2015-06-17 ENCOUNTER — Other Ambulatory Visit: Payer: Self-pay | Admitting: *Deleted

## 2015-06-17 DIAGNOSIS — H2513 Age-related nuclear cataract, bilateral: Secondary | ICD-10-CM | POA: Diagnosis not present

## 2015-06-17 MED ORDER — OXYCODONE HCL 10 MG PO TABS: ORAL_TABLET | ORAL | Status: DC

## 2015-06-17 NOTE — Telephone Encounter (Signed)
[facility] 

## 2015-06-22 DIAGNOSIS — Z23 Encounter for immunization: Secondary | ICD-10-CM | POA: Diagnosis not present

## 2015-06-24 ENCOUNTER — Non-Acute Institutional Stay (SKILLED_NURSING_FACILITY): Payer: Medicare Other | Admitting: Internal Medicine

## 2015-06-24 ENCOUNTER — Encounter: Payer: Self-pay | Admitting: Internal Medicine

## 2015-06-24 DIAGNOSIS — I10 Essential (primary) hypertension: Secondary | ICD-10-CM

## 2015-06-24 DIAGNOSIS — I509 Heart failure, unspecified: Secondary | ICD-10-CM | POA: Diagnosis not present

## 2015-06-24 DIAGNOSIS — G4700 Insomnia, unspecified: Secondary | ICD-10-CM | POA: Diagnosis not present

## 2015-06-24 DIAGNOSIS — J449 Chronic obstructive pulmonary disease, unspecified: Secondary | ICD-10-CM | POA: Diagnosis not present

## 2015-06-24 DIAGNOSIS — G8194 Hemiplegia, unspecified affecting left nondominant side: Secondary | ICD-10-CM | POA: Diagnosis not present

## 2015-06-24 DIAGNOSIS — E1149 Type 2 diabetes mellitus with other diabetic neurological complication: Secondary | ICD-10-CM

## 2015-06-24 DIAGNOSIS — Z9189 Other specified personal risk factors, not elsewhere classified: Secondary | ICD-10-CM

## 2015-06-24 DIAGNOSIS — Z794 Long term (current) use of insulin: Secondary | ICD-10-CM | POA: Diagnosis not present

## 2015-06-24 NOTE — Progress Notes (Signed)
Patient ID: Susan [name], female   DOB: [birth year], 64 y.o.   MRN: [id number]     [facility]  PCP: Blanchie Serve, MD  Code Status: DNR  Allergies  Allergen Reactions  . Onion Shortness Of Breath and Swelling  . Pollen Extract Itching  . Morphine And Related Hives    PT DENIES ALLERGY  . Niaspan [Niacin Er] Other (See Comments)    unknown  . Other     onions  . Valium [Diazepam] Other (See Comments)    unknown  . Wellbutrin [Bupropion] Other (See Comments)    unknown    Chief Complaint  Patient presents with  . Medical Management of Chronic Issues    Routine Visit      HPI 64 y/o female patient is seen for routine visit. She has been having chest congestion with cough for few days. feels tired and has not been able to sleep at night. No fever or chills reported. Has been seen by pain clinic recently and pain medication was adjusted. She was started on trazodone but given her AMS with trazodone in the past, this has not been started. As per staff in bed all day. cbg reading on review 195-270s mostly, with few in 300s. Appetite is good. Bowel movement is regular. 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   Review of Systems  Constitutional: Negative for fever, chills, diaphoresis.  HENT: Negative for hearing loss and sore throat.   Eyes: Negative for blurred vision, double vision and discharge.  Respiratory: Negative for shortness of breath and wheezing.   Cardiovascular: Negative for chest pain, palpitations,leg swelling.  Gastrointestinal: Negative for heartburn, nausea, vomiting   Genitourinary: negative for dysuria  Musculoskeletal: Negative for falls Skin: Negative for rash.  Neurological: Negative for dizziness, tingling, headaches.  Psychiatric/Behavioral: history of depression and followed by psych services  Past Medical History  Diagnosis Date  .  Hyperlipidemia   . Diabetes mellitus without complication ([place])   . COPD (chronic obstructive pulmonary disease) ([place])   . Depression   . Hypertension   . Thyroid disease   . Anemia   . CAD (coronary artery disease)   . Chronic pain   . Hemiplegia affecting non-dominant side, post-stroke ([place])   . Fall   . Urine retention   . Stroke [facility]) 12/2013    left side paralysis     Medication List       This list is accurate as of: 06/24/15  4:22 PM.  Always use your most recent med list.               aspirin 81 MG tablet  Take 81 mg by mouth daily.     baclofen 10 MG tablet  Commonly known as:  LIORESAL  Take 10 mg by mouth 3 (three) times daily.     clonazePAM 0.5 MG tablet  Commonly known as:  KLONOPIN  Take one tablet by mouth every 6 hours as needed for anxiety     cyclobenzaprine 5 MG tablet  Commonly known as:  FLEXERIL  Take 5 mg by mouth 2 (two) times daily.     docusate sodium 100 MG capsule  Commonly known as:  COLACE  Take 2 capsules (200 mg total) by mouth 2 (two) times daily.     Fish Oil 1000 MG Caps  Take 2 capsules by mouth daily.     fluticasone  50 MCG/ACT nasal spray  Commonly known as:  FLONASE  Place 2 sprays into both nostrils daily.     furosemide 20 MG tablet  Commonly known as:  LASIX  Take 20 mg by mouth daily.     gabapentin 400 MG capsule  Commonly known as:  NEURONTIN  3 by mouth at bedtime, 2 by mouth at supper and 1 at lunch for neuropathy     glimepiride 2 MG tablet  Commonly known as:  AMARYL  Take 2 mg by mouth daily with breakfast.     hydrOXYzine 25 MG tablet  Commonly known as:  ATARAX/VISTARIL  Take 25 mg by mouth every 8 (eight) hours as needed for itching.     insulin lispro 100 UNIT/ML injection  Commonly known as:  HUMALOG  25 units prior to meals scheduled, inject per sliding scale insulin 150-200= 5 units, 201-250= 8 units, 251-300= 10 units     isosorbide mononitrate 30 MG 24 hr tablet  Commonly known as:   IMDUR  Take 30 mg by mouth daily.     levothyroxine 25 MCG tablet  Commonly known as:  SYNTHROID, LEVOTHROID  Take 25 mcg by mouth daily before breakfast.     Linaclotide 145 MCG Caps capsule  Commonly known as:  LINZESS  Take 145 mcg by mouth daily.     linagliptin 5 MG Tabs tablet  Commonly known as:  TRADJENTA  Take 5 mg by mouth daily.     Melatonin 3 MG Tabs  Take 3 mg by mouth at bedtime.     metFORMIN 1000 MG tablet  Commonly known as:  GLUCOPHAGE  Take 1,000 mg by mouth 2 (two) times daily with a meal.     nitroGLYCERIN 0.4 MG SL tablet  Commonly known as:  NITROSTAT  Place 0.4 mg under the tongue every 5 (five) minutes as needed for chest pain.     Oxycodone HCl 10 MG Tabs  1 by mouth five times daily (5am,9am,1pm,5pm,9pm) and 1 by mouth daily as needed form 10p-5a for pain     pregabalin 200 MG capsule  Commonly known as:  LYRICA  Take 200 mg by mouth at bedtime.     promethazine 12.5 MG tablet  Commonly known as:  PHENERGAN  Take 12.5 mg by mouth every 6 (six) hours as needed for nausea or vomiting.     rosuvastatin 40 MG tablet  Commonly known as:  CRESTOR  Take 40 mg by mouth every evening.     sertraline 100 MG tablet  Commonly known as:  ZOLOFT  Take 100 mg by mouth daily.     simethicone 80 MG chewable tablet  Commonly known as:  MYLICON  Chew 324 mg by mouth 3 (three) times daily.     spironolactone 25 MG tablet  Commonly known as:  ALDACTONE  Take 25 mg by mouth 2 (two) times daily.     SYSTANE ULTRA OP  Place 1 drop into both eyes at bedtime. And as needed     tiotropium 18 MCG inhalation capsule  Commonly known as:  SPIRIVA  Place 18 mcg into inhaler and inhale daily.     TOUJEO SOLOSTAR 300 UNIT/ML Sopn  Generic drug:  Insulin Glargine  Inject 110 Units into the skin.       Physical exam BP 118/71 mmHg  Pulse 88  Temp(Src) 97.4 F (36.3 C) (Oral)  Resp 20  SpO2 96%  Wt Readings from Last 3 Encounters:  05/26/15 192 lb  (  87.091 kg)  04/26/15 191 lb 3.2 oz (86.728 kg)  04/13/15 189 lb 6.4 oz (85.911 kg)   General- elderly obese female in no acute distress Head- atraumatic, normocephalic Eyes- no pallor, no icterus, no discharge Neck- no lymphadenopathy Mouth- normal mucus membrane, upper and lower dentures Cardiovascular- normal s1,s2, no murmurs, normal distal pulses, no leg edema Respiratory- bilateral clear to auscultation, no wheeze, no rhonchi, no crackles Abdomen- bowel sounds present, soft, non tender Musculoskeletal- able to move right sided extremities, left sided hemiplegia with left foot drop, no leg edema Neurological- no focal deficit Skin- warm and dry Psychiatry- alert and oriented to person, place and time, normal mood and affect    Labs  CBC Latest Ref Rng 02/17/2015 08/30/2014 05/16/2014  WBC - 8.3 8.5 6.7  Hemoglobin 12.0 - 16.0 g/dL 10.6(A) 11.0(L) 11.2(L)  Hematocrit 36 - 46 % 34(A) 34.9(L) 36.0  Platelets 150 - 399 K/L 221 264 264   CMP Latest Ref Rng 02/17/2015 08/30/2014 08/17/2014  Glucose 70 - 99 mg/dL - 179(H) -  BUN 4 - 21 mg/dL 15 17 17   Creatinine 0.5 - 1.1 mg/dL 0.7 0.66 0.5  Sodium 137 - 147 mmol/L 138 135(L) 136(A)  Potassium 3.4 - 5.3 mmol/L 3.5 4.4 4.4  Chloride 96 - 112 mEq/L - 95(L) -  CO2 19 - 32 mEq/L - 28 -  Calcium 8.4 - 10.5 mg/dL - 8.8 -  Total Protein 6.0 - 8.3 g/dL - - -  Total Bilirubin 0.3 - 1.2 mg/dL - - -  Alkaline Phos 25 - 125 U/L 94 - -  AST 13 - 35 U/L 13 - -  ALT 7 - 35 U/L 12 - -   Lab Results  Component Value Date   HGBA1C 8.7* 05/27/2015   Lab Results  Component Value Date   TSH 2.61 02/17/2015   Lipid Panel     Component Value Date/Time   CHOL 191 08/13/2014   TRIG 144 08/13/2014   HDL 30* 08/13/2014   LDLCALC 132 08/13/2014   08/12/14 urine microalbumin/cr 12.6 04/19/15 wbc 8.3, hb 10.6, hct 33.6, plt 221, na 138, k 3.5, glu 174, bun 15, cr 0.72, tsh 2.631, ca 9   Assessment/plan  Insomnia Recommended healthy sleep  hygiene. Advised to be OOB daily and after dinner to go to bed only at bedtime. Continue melatonin for now. Avoid CNS depressants or sedative medication given her comorbidities and being on pain regimen   Dm type 2 Reviewed a1c 8.7 from 9/16. Continue amaryl 2 mg daily, tujeo 110 u daily, metformin 1000 mg bid with tradjenta 5 mg daily and humalong 25 u tid with meals and coverage SSI. Dietary compliance reinforced. Continue statin and aspirin.   chf Appears euvolemic. Continue lasix with aldactone and isodril for now, monitor bp and weight. No signs of fluid overload  Copd Breathing stable. Continue spiriva and add prn abuterol for now  Sedentary lifestyle Advised to be OOB atleast bid, patient willing to try this.   HTN Stable, continue isodril 30 mg daily, lasix 20 mg daily and aldactone 25 mg bid. Monitor bp  Left hemiplegia post cva Continue flexeril 5 mg bid and baclofen 10 mg tid for muscle spasm    Blanchie Serve, MD  [facility] [phone] (Monday-Friday 8 am - 5 pm) [phone] (afterhours)

## 2015-06-28 DIAGNOSIS — M79671 Pain in right foot: Secondary | ICD-10-CM | POA: Diagnosis not present

## 2015-06-28 DIAGNOSIS — I739 Peripheral vascular disease, unspecified: Secondary | ICD-10-CM | POA: Diagnosis not present

## 2015-06-28 DIAGNOSIS — E1140 Type 2 diabetes mellitus with diabetic neuropathy, unspecified: Secondary | ICD-10-CM | POA: Diagnosis not present

## 2015-06-28 DIAGNOSIS — B351 Tinea unguium: Secondary | ICD-10-CM | POA: Diagnosis not present

## 2015-07-12 ENCOUNTER — Other Ambulatory Visit: Payer: Self-pay | Admitting: *Deleted

## 2015-07-12 DIAGNOSIS — M792 Neuralgia and neuritis, unspecified: Secondary | ICD-10-CM | POA: Diagnosis not present

## 2015-07-12 DIAGNOSIS — M545 Low back pain: Secondary | ICD-10-CM | POA: Diagnosis not present

## 2015-07-12 DIAGNOSIS — G894 Chronic pain syndrome: Secondary | ICD-10-CM | POA: Diagnosis not present

## 2015-07-12 MED ORDER — OXYCODONE HCL 10 MG PO TABS: ORAL_TABLET | ORAL | Status: DC

## 2015-07-12 NOTE — Telephone Encounter (Signed)
[facility] 

## 2015-07-16 ENCOUNTER — Other Ambulatory Visit: Payer: Self-pay | Admitting: Family Medicine

## 2015-07-16 DIAGNOSIS — M545 Low back pain: Secondary | ICD-10-CM

## 2015-08-09 DIAGNOSIS — M545 Low back pain: Secondary | ICD-10-CM | POA: Diagnosis not present

## 2015-08-09 DIAGNOSIS — G894 Chronic pain syndrome: Secondary | ICD-10-CM | POA: Diagnosis not present

## 2015-08-09 DIAGNOSIS — M792 Neuralgia and neuritis, unspecified: Secondary | ICD-10-CM | POA: Diagnosis not present

## 2015-08-10 ENCOUNTER — Other Ambulatory Visit: Payer: Self-pay | Admitting: *Deleted

## 2015-08-10 MED ORDER — OXYCODONE-ACETAMINOPHEN 5-325 MG PO TABS: ORAL_TABLET | ORAL | Status: DC

## 2015-08-10 NOTE — Telephone Encounter (Signed)
[facility] 

## 2015-08-16 ENCOUNTER — Other Ambulatory Visit: Payer: Self-pay | Admitting: Podiatry

## 2015-08-16 DIAGNOSIS — R0989 Other specified symptoms and signs involving the circulatory and respiratory systems: Secondary | ICD-10-CM

## 2015-08-23 ENCOUNTER — Ambulatory Visit (HOSPITAL_COMMUNITY): Admission: RE | Admit: 2015-08-23 | Payer: Medicare Other | Source: Ambulatory Visit

## 2015-08-31 ENCOUNTER — Ambulatory Visit (HOSPITAL_COMMUNITY)
Admission: RE | Admit: 2015-08-31 | Discharge: 2015-08-31 | Disposition: A | Discharge status: Home / Self Care | Payer: Medicare Other | Source: Ambulatory Visit | Attending: Podiatry | Admitting: Podiatry

## 2015-08-31 DIAGNOSIS — R0989 Other specified symptoms and signs involving the circulatory and respiratory systems: Secondary | ICD-10-CM | POA: Diagnosis not present

## 2015-08-31 DIAGNOSIS — I743 Embolism and thrombosis of arteries of the lower extremities: Secondary | ICD-10-CM | POA: Diagnosis not present

## 2015-09-01 ENCOUNTER — Ambulatory Visit
Admission: RE | Admit: 2015-09-01 | Discharge: 2015-09-01 | Disposition: A | Discharge status: Home / Self Care | Payer: Medicare Other | Source: Ambulatory Visit | Attending: Family Medicine | Admitting: Family Medicine

## 2015-09-01 ENCOUNTER — Non-Acute Institutional Stay (SKILLED_NURSING_FACILITY): Payer: Medicare Other | Admitting: Nurse Practitioner

## 2015-09-01 DIAGNOSIS — Z794 Long term (current) use of insulin: Secondary | ICD-10-CM | POA: Diagnosis not present

## 2015-09-01 DIAGNOSIS — M21372 Foot drop, left foot: Secondary | ICD-10-CM | POA: Diagnosis not present

## 2015-09-01 DIAGNOSIS — G8929 Other chronic pain: Secondary | ICD-10-CM | POA: Diagnosis not present

## 2015-09-01 DIAGNOSIS — E1149 Type 2 diabetes mellitus with other diabetic neurological complication: Secondary | ICD-10-CM | POA: Diagnosis not present

## 2015-09-01 DIAGNOSIS — M5126 Other intervertebral disc displacement, lumbar region: Secondary | ICD-10-CM | POA: Diagnosis not present

## 2015-09-01 DIAGNOSIS — B3731 Acute candidiasis of vulva and vagina: Secondary | ICD-10-CM

## 2015-09-01 DIAGNOSIS — I10 Essential (primary) hypertension: Secondary | ICD-10-CM

## 2015-09-01 DIAGNOSIS — B373 Candidiasis of vulva and vagina: Secondary | ICD-10-CM | POA: Diagnosis not present

## 2015-09-01 DIAGNOSIS — G4700 Insomnia, unspecified: Secondary | ICD-10-CM | POA: Diagnosis not present

## 2015-09-01 DIAGNOSIS — M545 Low back pain: Secondary | ICD-10-CM

## 2015-09-01 NOTE — Progress Notes (Signed)
Patient ID: Susan [name], female   DOB: [birth year], 64 y.o.   MRN: [id number]    Nursing Home Location:  [place] of Service: SN[facility] (31)  PCP: Blanchie Serve, MD  Allergies  Allergen Reactions  . Onion Shortness Of Breath and Swelling  . Pollen Extract Itching  . Morphine And Related Hives    PT DENIES ALLERGY  . Niaspan [Niacin Er] Other (See Comments)    unknown  . Other     onions  . Valium [Diazepam] Other (See Comments)    unknown  . Wellbutrin [Bupropion] Other (See Comments)    unknown    Chief Complaint  Patient presents with  . Medical Management of Chronic Issues    HPI:  Patient is a 64 y.o. female seen today at [facility] for routine follow up on chronic conditions. She has a past medical history of DM, COPD, HTN, chronic pain and CAD. pt reports she feels like she is getting a yeast infection. Reports increased discharge and itching to vaginal area. Complains of sleep but naps throughout the day. Pt has been on trazodone in the past and wants it restarted, however had adverse effect in the past with AMS.  Reports pain has been stable, no increase in chronic pain. Eating well. Mood has been stable.  Blood sugars ranging in the 200s at breakfast with some low blood sugars around noon.  Review of Systems:  Review of Systems  Constitutional: Negative for fever, chills and fatigue.  HENT: Negative for congestion, sneezing and sore throat.   Eyes: Negative.   Respiratory: Negative for cough, shortness of breath and wheezing.   Cardiovascular: Negative for chest pain, palpitations and leg swelling.  Gastrointestinal: Negative for nausea, diarrhea and constipation.  Genitourinary: Positive for vaginal discharge (with itching ). Negative for frequency and difficulty urinating.  Musculoskeletal: Positive for myalgias. Negative for back pain and joint swelling.       Reports neuropathic pain, chronic pain, fibromyalgia   Skin:         Ringworm on left arm and face  Psychiatric/Behavioral: Positive for sleep disturbance.    Past Medical History  Diagnosis Date  . Hyperlipidemia   . Diabetes mellitus without complication ([place])   . COPD (chronic obstructive pulmonary disease) ([place])   . Depression   . Hypertension   . Thyroid disease   . Anemia   . CAD (coronary artery disease)   . Chronic pain   . Hemiplegia affecting non-dominant side, post-stroke ([place])   . Fall   . Urine retention   . Stroke [facility]) 12/2013    left side paralysis   Past Surgical History  Procedure Laterality Date  . Joint replacement      bil hip  . Abdominal hysterectomy    . Cholecystectomy    . Appendectomy    . Tonsillectomy     Social History:   reports that she has quit smoking. Her smoking use included Cigarettes. She does not have any smokeless tobacco history on file. She reports that she does not drink alcohol or use illicit drugs.  No family history on file.  Medications: Patient's Medications  New Prescriptions   No medications on file  Previous Medications   ASPIRIN 81 MG TABLET    Take 81 mg by mouth daily.   BACLOFEN (LIORESAL) 10 MG TABLET    Take 10 mg by mouth 3 (three) times daily.    CLONAZEPAM (KLONOPIN) 0.5 MG  TABLET    Take one tablet by mouth every 6 hours as needed for anxiety   CYCLOBENZAPRINE (FLEXERIL) 5 MG TABLET    Take 5 mg by mouth 2 (two) times daily.   DOCUSATE SODIUM (COLACE) 100 MG CAPSULE    Take 2 capsules (200 mg total) by mouth 2 (two) times daily.   FLUTICASONE (FLONASE) 50 MCG/ACT NASAL SPRAY    Place 2 sprays into both nostrils daily.   FUROSEMIDE (LASIX) 20 MG TABLET    Take 20 mg by mouth daily.   GABAPENTIN (NEURONTIN) 400 MG CAPSULE    3 by mouth at bedtime, 2 by mouth at supper and 1 at lunch for neuropathy   GLIMEPIRIDE (AMARYL) 2 MG TABLET    Take 2 mg by mouth daily with breakfast.   HYDROXYZINE (ATARAX/VISTARIL) 25 MG TABLET    Take 25 mg by mouth every 8 (eight) hours as  needed for itching.   INSULIN GLARGINE (TOUJEO SOLOSTAR) 300 UNIT/ML SOPN    Inject 110 Units into the skin.    INSULIN LISPRO (HUMALOG) 100 UNIT/ML INJECTION    25 units prior to meals scheduled, inject per sliding scale insulin 150-200= 5 units, 201-250= 8 units, 251-300= 10 units   ISOSORBIDE MONONITRATE (IMDUR) 30 MG 24 HR TABLET    Take 30 mg by mouth daily.   LEVOTHYROXINE (SYNTHROID, LEVOTHROID) 25 MCG TABLET    Take 25 mcg by mouth daily before breakfast.   LINACLOTIDE (LINZESS) 145 MCG CAPS CAPSULE    Take 145 mcg by mouth daily.   LINAGLIPTIN (TRADJENTA) 5 MG TABS TABLET    Take 5 mg by mouth daily.   MELATONIN 3 MG TABS    Take 3 mg by mouth at bedtime.    METFORMIN (GLUCOPHAGE) 1000 MG TABLET    Take 1,000 mg by mouth 2 (two) times daily with a meal.   NITROGLYCERIN (NITROSTAT) 0.4 MG SL TABLET    Place 0.4 mg under the tongue every 5 (five) minutes as needed for chest pain.   OMEGA-3 FATTY ACIDS (FISH OIL) 1000 MG CAPS    Take 2 capsules by mouth daily.   OXYCODONE HCL 10 MG TABS    Take one tablet by mouth four times daily for pain   OXYCODONE-ACETAMINOPHEN (PERCOCET/ROXICET) 5-325 MG TABLET    Take one tablet by mouth four times daily for pain. Do not exceed 4gm of Tylenol in 24 hours   POLYETHYL GLYCOL-PROPYL GLYCOL (SYSTANE ULTRA OP)    Place 1 drop into both eyes at bedtime. And as needed   PREGABALIN (LYRICA) 200 MG CAPSULE    Take 200 mg by mouth at bedtime.   PROMETHAZINE (PHENERGAN) 12.5 MG TABLET    Take 12.5 mg by mouth every 6 (six) hours as needed for nausea or vomiting.   ROSUVASTATIN (CRESTOR) 40 MG TABLET    Take 40 mg by mouth every evening.    SERTRALINE (ZOLOFT) 100 MG TABLET    Take 100 mg by mouth daily.   SIMETHICONE (MYLICON) 80 MG CHEWABLE TABLET    Chew 160 mg by mouth 3 (three) times daily.    SPIRONOLACTONE (ALDACTONE) 25 MG TABLET    Take 25 mg by mouth 2 (two) times daily.    TIOTROPIUM (SPIRIVA) 18 MCG INHALATION CAPSULE    Place 18 mcg into inhaler and  inhale daily.  Modified Medications   No medications on file  Discontinued Medications   No medications on file     Physical Exam: Filed Vitals:  09/01/15 1450  BP: 124/70  Pulse: 90  Resp: 20  Weight: 189 lb (85.73 kg)    Physical Exam  Constitutional: She is oriented to person, place, and time. No distress.  Obese female   HENT:  Head: Normocephalic and atraumatic.  Eyes: Pupils are equal, round, and reactive to light.  Neck: Normal range of motion. Neck supple.  Cardiovascular: Normal rate, regular rhythm and normal heart sounds.   Pulmonary/Chest: Effort normal and breath sounds normal. She has no wheezes.  Abdominal: Soft. Bowel sounds are normal. She exhibits no distension.  Genitourinary:  Yeast to vaginal area  Musculoskeletal: Normal range of motion.  Lymphadenopathy:    She has no cervical adenopathy.  Neurological: She is alert and oriented to person, place, and time.  Skin: Skin is warm and dry. She is not diaphoretic.  Psychiatric: She has a normal mood and affect.    Labs reviewed: Basic Metabolic Panel:  Recent Labs  02/17/15  NA 138  K 3.5  BUN 15  CREATININE 0.7   Liver Function Tests:  Recent Labs  02/17/15  AST 13  ALT 12  ALKPHOS 94   No results for input(s): LIPASE, AMYLASE in the last 8760 hours. No results for input(s): AMMONIA in the last 8760 hours. CBC:  Recent Labs  02/17/15  WBC 8.3  HGB 10.6*  HCT 34*  PLT 221   TSH:  Recent Labs  09/16/14 02/17/15  TSH 2.50 2.61   A1C: Lab Results  Component Value Date   HGBA1C 8.7* 05/27/2015   Lipid Panel: No results for input(s): CHOL, HDL, LDLCALC, TRIG, CHOLHDL, LDLDIRECT in the last 8760 hours.    Assessment/Plan 1. Essential hypertension, benign -blood pressure remains stable, conts on lasix and aldactone   2. Insomnia Stable, again wants trazodone added back but due to adverse effect in the past will cont current regimen, conts on melatonin 3 mg qhs  3.  Type 2 diabetes mellitus with other neurologic complication, with long-term current use of insulin (HCC) Blood sugars improved control, ranging from 200s, pt snacks at night so morning CBGs are not fasitng. Some low blood sugars at noon. will follow up A1c, will cont current medication for now.   4. Yeast vaginitis -diflucan 150 mg by mouth today and to repeat in 3 days.   5. Chronic pain Ongoing complaint, stable at this time. conts on percocet and gabapentin  Delvon Chipps K. Harle Battiest  [facility] [phone] 8 am - 5 pm) [phone] (after hours)

## 2015-09-02 DIAGNOSIS — D500 Iron deficiency anemia secondary to blood loss (chronic): Secondary | ICD-10-CM | POA: Diagnosis not present

## 2015-09-02 DIAGNOSIS — E0865 Diabetes mellitus due to underlying condition with hyperglycemia: Secondary | ICD-10-CM | POA: Diagnosis not present

## 2015-09-02 LAB — HEPATIC FUNCTION PANEL
ALT: 16 U/L (ref 7–35)
AST: 22 U/L (ref 13–35)
Alkaline Phosphatase: 113 U/L (ref 25–125)
BILIRUBIN, TOTAL: 0.3 mg/dL

## 2015-09-02 LAB — CBC AND DIFFERENTIAL
HEMATOCRIT: 38 % (ref 36–46)
HEMOGLOBIN: 11.5 g/dL — AB (ref 12.0–16.0)
PLATELETS: 270 10*3/uL (ref 150–399)
WBC: 7.5 10^3/mL

## 2015-09-02 LAB — BASIC METABOLIC PANEL
BUN: 19 mg/dL (ref 4–21)
Creatinine: 0.7 mg/dL (ref 0.5–1.1)
GLUCOSE: 324 mg/dL
Potassium: 4.2 mmol/L (ref 3.4–5.3)
Sodium: 139 mmol/L (ref 137–147)

## 2015-09-03 ENCOUNTER — Non-Acute Institutional Stay (SKILLED_NURSING_FACILITY): Payer: Medicare Other | Admitting: Adult Health

## 2015-09-03 DIAGNOSIS — E1165 Type 2 diabetes mellitus with hyperglycemia: Secondary | ICD-10-CM | POA: Diagnosis not present

## 2015-09-03 DIAGNOSIS — IMO0002 Reserved for concepts with insufficient information to code with codable children: Secondary | ICD-10-CM

## 2015-09-03 DIAGNOSIS — E1142 Type 2 diabetes mellitus with diabetic polyneuropathy: Secondary | ICD-10-CM | POA: Diagnosis not present

## 2015-09-03 DIAGNOSIS — Z794 Long term (current) use of insulin: Secondary | ICD-10-CM

## 2015-09-05 ENCOUNTER — Encounter: Payer: Self-pay | Admitting: Adult Health

## 2015-09-05 MED ORDER — INSULIN GLARGINE 300 UNIT/ML ~~LOC~~ SOPN: 120.0000 [IU] | PEN_INJECTOR | Freq: Every day | SUBCUTANEOUS | Status: DC

## 2015-09-05 MED ORDER — INSULIN LISPRO 100 UNIT/ML ~~LOC~~ SOLN: 10.0000 [IU] | Freq: Three times a day (TID) | SUBCUTANEOUS | Status: DC

## 2015-09-05 NOTE — Progress Notes (Signed)
Patient ID: Susan [name], female   DOB: 12/10/1950, 64 y.o.   MRN: [id number]    Facility: [facility]      Allergies  Allergen Reactions  . Onion Shortness Of Breath and Swelling  . Pollen Extract Itching  . Morphine And Related Hives    PT DENIES ALLERGY  . Niaspan [Niacin Er] Other (See Comments)    unknown  . Other     onions  . Valium [Diazepam] Other (See Comments)    unknown  . Wellbutrin [Bupropion] Other (See Comments)    unknown    Chief Complaint  Patient presents with  . Acute Visit    follow up labs     HPI:  Her hgb a1c is 9.4. Her fasting glucose was 324. Per nursing she continues to struggle with adhering to her dietary restrictions. She is gaining weight. She is stating that she is not sleeping at night. She is being followed by the pain clinic for the management of her chronic pain issues.    Past Medical History  Diagnosis Date  . Hyperlipidemia   . Diabetes mellitus without complication ([place])   . COPD (chronic obstructive pulmonary disease) ([place])   . Depression   . Hypertension   . Thyroid disease   . Anemia   . CAD (coronary artery disease)   . Chronic pain   . Hemiplegia affecting non-dominant side, post-stroke ([place])   . Fall   . Urine retention   . Stroke [facility]) 12/2013    left side paralysis    Past Surgical History  Procedure Laterality Date  . Joint replacement      bil hip  . Abdominal hysterectomy    . Cholecystectomy    . Appendectomy    . Tonsillectomy      VITAL SIGNS BP 132/78 mmHg  Pulse 80  Resp 16  SpO2 98%  Patient's Medications  New Prescriptions   No medications on file  Previous Medications   ASPIRIN 81 MG TABLET    Take 81 mg by mouth daily.   BACLOFEN (LIORESAL) 10 MG TABLET    Take 10 mg by mouth 3 (three) times daily.    CLONAZEPAM (KLONOPIN) 0.5 MG TABLET    Take one tablet by mouth every 6 hours as needed for anxiety   CYCLOBENZAPRINE (FLEXERIL) 5 MG TABLET    Take 5 mg by mouth 2 (two) times daily.     DOCUSATE SODIUM (COLACE) 100 MG CAPSULE    Take 2 capsules (200 mg total) by mouth 2 (two) times daily.   FLUTICASONE (FLONASE) 50 MCG/ACT NASAL SPRAY    Place 2 sprays into both nostrils daily.   FUROSEMIDE (LASIX) 20 MG TABLET    Take 20 mg by mouth daily.   GABAPENTIN (NEURONTIN) 400 MG CAPSULE    3 by mouth at bedtime, 2 by mouth at supper and 1 at lunch for neuropathy   GLIMEPIRIDE (AMARYL) 2 MG TABLET    Take 2 mg by mouth daily with breakfast.   HYDROXYZINE (ATARAX/VISTARIL) 25 MG TABLET    Take 25 mg by mouth every 8 (eight) hours as needed for itching.   INSULIN GLARGINE (TOUJEO SOLOSTAR) 300 UNIT/ML SOPN    Inject 110 Units into the skin.    INSULIN LISPRO (HUMALOG) 100 UNIT/ML INJECTION    25 units prior to meals scheduled, inject per sliding scale insulin 150-200= 5 units, 201-250= 8 units, 251-300= 10 units   ISOSORBIDE MONONITRATE (IMDUR) 30 MG 24 HR TABLET  Take 30 mg by mouth daily.   LEVOTHYROXINE (SYNTHROID, LEVOTHROID) 25 MCG TABLET    Take 25 mcg by mouth daily before breakfast.   LINACLOTIDE (LINZESS) 145 MCG CAPS CAPSULE    Take 145 mcg by mouth daily.   LINAGLIPTIN (TRADJENTA) 5 MG TABS TABLET    Take 5 mg by mouth daily.   MELATONIN 3 MG TABS    Take 3 mg by mouth at bedtime.    METFORMIN (GLUCOPHAGE) 1000 MG TABLET    Take 1,000 mg by mouth 2 (two) times daily with a meal.   NITROGLYCERIN (NITROSTAT) 0.4 MG SL TABLET    Place 0.4 mg under the tongue every 5 (five) minutes as needed for chest pain.   OMEGA-3 FATTY ACIDS (FISH OIL) 1000 MG CAPS    Take 2 capsules by mouth daily.   OXYCODONE HCL 10 MG TABS    Take one tablet by mouth four times daily for pain   OXYCODONE-ACETAMINOPHEN (PERCOCET/ROXICET) 5-325 MG TABLET    Take one tablet by mouth four times daily for pain. Do not exceed 4gm of Tylenol in 24 hours   POLYETHYL GLYCOL-PROPYL GLYCOL (SYSTANE ULTRA OP)    Place 1 drop into both eyes at bedtime. And as needed   PREGABALIN (LYRICA) 200 MG CAPSULE    Take 200 mg  by mouth at bedtime.   PROMETHAZINE (PHENERGAN) 12.5 MG TABLET    Take 12.5 mg by mouth every 6 (six) hours as needed for nausea or vomiting.   ROSUVASTATIN (CRESTOR) 40 MG TABLET    Take 40 mg by mouth every evening.    SERTRALINE (ZOLOFT) 100 MG TABLET    Take 100 mg by mouth daily.   SIMETHICONE (MYLICON) 80 MG CHEWABLE TABLET    Chew 160 mg by mouth 3 (three) times daily.    SPIRONOLACTONE (ALDACTONE) 25 MG TABLET    Take 25 mg by mouth 2 (two) times daily.    TIOTROPIUM (SPIRIVA) 18 MCG INHALATION CAPSULE    Place 18 mcg into inhaler and inhale daily.  Modified Medications   No medications on file  Discontinued Medications   No medications on file     SIGNIFICANT DIAGNOSTIC EXAMS  LABS REVIEWED:   09-02-15: wbc 7.5; hgb 11.5; hct 38.1; mcv 87.4; plt 270; glucose 324; bun 19; creat 0.74; k+ 4.2; na++139; liver normal albumin 3.76; hgb a1c 9.4    Review of Systems  Constitutional: Negative for malaise/fatigue.  Respiratory: Negative for cough and shortness of breath.   Cardiovascular: Negative for chest pain and leg swelling.  Gastrointestinal: Negative for heartburn, abdominal pain and constipation.  Musculoskeletal: Positive for myalgias, back pain, joint pain and neck pain.       Pain is being managed   Skin: Negative.   Psychiatric/Behavioral: The patient has insomnia. The patient is not nervous/anxious.      Physical Exam  Constitutional: No distress.  Obese   Eyes: Conjunctivae are normal.  Neck: Neck supple. No JVD present. No thyromegaly present.  Cardiovascular: Normal rate, regular rhythm and intact distal pulses.   Respiratory: Effort normal and breath sounds normal. No respiratory distress. She has no wheezes.  GI: Soft. Bowel sounds are normal. She exhibits no distension. There is no tenderness.  Musculoskeletal: She exhibits no edema.  Left hemiparesis   Lymphadenopathy:    She has no cervical adenopathy.  Neurological: She is alert.  Skin: Skin is warm  and dry. She is not diaphoretic.  Psychiatric: She has a normal mood and  affect.        ASSESSMENT/ PLAN:  1. Diabetes: will increase her tuojeo to 120 units daily and will change her humalog to 30 units with meals with an additional 10 units for cbg >=150   Will monitor her status.    Time spent with patient  30  minutes >50% time spent counseling; reviewing medical record; tests; labs; and developing future plan of care   Ok Edwards NP [facility]  Contact [phone] Monday through Friday 8am- 5pm  After hours call [phone]

## 2015-09-06 ENCOUNTER — Telehealth: Payer: Self-pay | Admitting: *Deleted

## 2015-09-06 NOTE — Telephone Encounter (Addendum)
-----   Message from Trula Slade, DPM sent at 09/03/2015  8:45 AM EST ----- Can you please let her know that vascular studies were normal? Thanks.  Results called to Jason Fila, and [employer]

## 2015-09-06 NOTE — Telephone Encounter (Signed)
Called and spoke with the patient referring back to chart notes from on [id number] with [facility] Certified Pedorthist "Patient stated had identical cast made a year ago.  She is non-ambulatory. Patient was not casted as it would not be covered by medicare."  Reminded patient of the visit and conversation, if the specialist states it will not be covered there is nothing we can do.  Patient states she is still having a lot of pain transferred her to scheduling for follow up appointment with Dr. Jacqualyn Posey

## 2015-09-06 NOTE — Telephone Encounter (Signed)
Entered in error

## 2015-09-06 NOTE — Telephone Encounter (Signed)
Pt' husband, Nicki Reaper states please inform pt of the her results.  I called [phone] and informed pt, she asked if the brace made from a cast was ready.  I told pt, I would have the pt who monitored the braces, check the status.

## 2015-09-07 DIAGNOSIS — R300 Dysuria: Secondary | ICD-10-CM | POA: Diagnosis not present

## 2015-09-14 DIAGNOSIS — E1165 Type 2 diabetes mellitus with hyperglycemia: Secondary | ICD-10-CM | POA: Diagnosis not present

## 2015-09-14 LAB — HEMOGLOBIN A1C: HEMOGLOBIN A1C: 9.3

## 2015-09-15 ENCOUNTER — Other Ambulatory Visit: Payer: Self-pay | Admitting: *Deleted

## 2015-09-15 DIAGNOSIS — I739 Peripheral vascular disease, unspecified: Secondary | ICD-10-CM | POA: Diagnosis not present

## 2015-09-15 DIAGNOSIS — M545 Low back pain: Secondary | ICD-10-CM | POA: Diagnosis not present

## 2015-09-15 DIAGNOSIS — B351 Tinea unguium: Secondary | ICD-10-CM | POA: Diagnosis not present

## 2015-09-15 DIAGNOSIS — M79671 Pain in right foot: Secondary | ICD-10-CM | POA: Diagnosis not present

## 2015-09-15 DIAGNOSIS — G894 Chronic pain syndrome: Secondary | ICD-10-CM | POA: Diagnosis not present

## 2015-09-15 DIAGNOSIS — Z79899 Other long term (current) drug therapy: Secondary | ICD-10-CM | POA: Diagnosis not present

## 2015-09-15 DIAGNOSIS — E1140 Type 2 diabetes mellitus with diabetic neuropathy, unspecified: Secondary | ICD-10-CM | POA: Diagnosis not present

## 2015-09-15 DIAGNOSIS — M792 Neuralgia and neuritis, unspecified: Secondary | ICD-10-CM | POA: Diagnosis not present

## 2015-09-15 LAB — HM DIABETES FOOT EXAM

## 2015-09-15 MED ORDER — CLONAZEPAM 0.5 MG PO TABS: ORAL_TABLET | ORAL | Status: DC

## 2015-09-15 MED ORDER — OXYCODONE-ACETAMINOPHEN 5-325 MG PO TABS: ORAL_TABLET | ORAL | Status: DC

## 2015-09-15 NOTE — Telephone Encounter (Signed)
[facility] 

## 2015-09-22 DIAGNOSIS — G4700 Insomnia, unspecified: Secondary | ICD-10-CM | POA: Diagnosis not present

## 2015-09-22 DIAGNOSIS — F411 Generalized anxiety disorder: Secondary | ICD-10-CM | POA: Diagnosis not present

## 2015-09-22 DIAGNOSIS — F329 Major depressive disorder, single episode, unspecified: Secondary | ICD-10-CM | POA: Diagnosis not present

## 2015-10-04 DIAGNOSIS — F411 Generalized anxiety disorder: Secondary | ICD-10-CM | POA: Diagnosis not present

## 2015-10-04 DIAGNOSIS — F329 Major depressive disorder, single episode, unspecified: Secondary | ICD-10-CM | POA: Diagnosis not present

## 2015-10-04 DIAGNOSIS — G4700 Insomnia, unspecified: Secondary | ICD-10-CM | POA: Diagnosis not present

## 2015-10-08 ENCOUNTER — Encounter: Payer: Self-pay | Admitting: Family

## 2015-10-08 ENCOUNTER — Non-Acute Institutional Stay (SKILLED_NURSING_FACILITY): Payer: Medicare Other | Admitting: Family

## 2015-10-08 DIAGNOSIS — E039 Hypothyroidism, unspecified: Secondary | ICD-10-CM | POA: Diagnosis not present

## 2015-10-08 DIAGNOSIS — J449 Chronic obstructive pulmonary disease, unspecified: Secondary | ICD-10-CM | POA: Diagnosis not present

## 2015-10-08 DIAGNOSIS — K5901 Slow transit constipation: Secondary | ICD-10-CM

## 2015-10-08 DIAGNOSIS — I10 Essential (primary) hypertension: Secondary | ICD-10-CM | POA: Diagnosis not present

## 2015-10-08 NOTE — Progress Notes (Signed)
Patient ID: Susan [name], female   DOB: Aug 15, 1951, 65 y.o.   MRN: [id number]  Location: [facility]  Provider:  Blanchie Serve, MD  Code Status:  DNR Goals of care: Advanced Directive information Advanced Directives 10/08/2015  Does patient have an advance directive? Yes  Type of Advance Directive Out of facility DNR (pink MOST or yellow form)  Does patient want to make changes to advanced directive? No - Patient declined  Copy of advanced directive(s) in chart? Yes     Chief Complaint  Patient presents with   Medical Management of Chronic Issues    Routine Visit    HPI:  Pt is a 65 y.o. female seen today at [facility] for medical management of chronic diseases.  She has a past medical History of COPD, Hypothyroidism, HTN, Type 2 DM, Constipation and among others. She is seen in her room today.Denies any acute issues. Facility staff reports no concerns.   Review of Systems  Constitutional: Negative.   HENT: Negative.   Eyes: Negative.   Respiratory: Negative.   Cardiovascular: Negative.   Gastrointestinal: Negative.   Genitourinary: Negative.   Musculoskeletal: Negative.   Skin: Negative.   Neurological: Negative.   Endo/Heme/Allergies: Negative.   Psychiatric/Behavioral: Negative.     Past Medical History  Diagnosis Date   Hyperlipidemia    Diabetes mellitus without complication ([place])    COPD (chronic obstructive pulmonary disease) ([place])    Depression    Hypertension    Thyroid disease    Anemia    CAD (coronary artery disease)    Chronic pain    Hemiplegia affecting non-dominant side, post-stroke ([place])    Fall    Urine retention    Stroke ([place]) 12/2013    left side paralysis   Past Surgical History  Procedure Laterality Date   Joint replacement      bil hip   Abdominal hysterectomy     Cholecystectomy     Appendectomy     Tonsillectomy      Allergies  Allergen Reactions   Onion  Shortness Of Breath and Swelling   Pollen Extract Itching   Morphine And Related Hives    PT DENIES ALLERGY   Niaspan [Niacin Er] Other (See Comments)    unknown   Other     onions   Trazodone And Nefazodone Other (See Comments)    Hallucinations   Valium [Diazepam] Other (See Comments)    unknown   Wellbutrin [Bupropion] Other (See Comments)    unknown      Medication List       This list is accurate as of: 10/08/15  4:56 PM.  Always use your most recent med list.               ALBUTEROL SULFATE PO  Take by mouth. Every 8 as needed for wheezing     aspirin 81 MG chewable tablet  Chew 81 mg by mouth daily.     baclofen 10 MG tablet  Commonly known as:  LIORESAL  Take 10 mg by mouth 3 (three) times daily.     clonazePAM 0.5 MG tablet  Commonly known as:  KLONOPIN  Take one tablet by mouth every 8 hours as needed for anxiety     cyclobenzaprine 5 MG tablet  Commonly known as:  FLEXERIL  Take 5 mg by mouth 2 (two) times daily.     docusate sodium 100 MG capsule  Commonly known as:  COLACE  Take 2 capsules (200 mg total) by mouth 2 (two) times daily.     Fish Oil 1000 MG Caps  Take 2 capsules by mouth daily.     fluticasone 50 MCG/ACT nasal spray  Commonly known as:  FLONASE  Place 2 sprays into both nostrils daily.     furosemide 20 MG tablet  Commonly known as:  LASIX  Take 20 mg by mouth daily.     glimepiride 2 MG tablet  Commonly known as:  AMARYL  Take 2 mg by mouth daily with breakfast.     hydrOXYzine 25 MG tablet  Commonly known as:  ATARAX/VISTARIL  Take 25 mg by mouth every 8 (eight) hours as needed for itching.     Insulin Glargine 300 UNIT/ML Sopn  Commonly known as:  TOUJEO SOLOSTAR  Inject 120 Units into the skin at bedtime.     insulin lispro 100 UNIT/ML injection  Commonly known as:  HUMALOG  Inject 30 Units into the skin 3 (three) times daily before meals. For CBG >/= 150 inject 10 additional units subcutaneously      isosorbide mononitrate 30 MG 24 hr tablet  Commonly known as:  IMDUR  Take 30 mg by mouth daily.     levothyroxine 25 MCG tablet  Commonly known as:  SYNTHROID, LEVOTHROID  Take 25 mcg by mouth daily before breakfast.     Linaclotide 145 MCG Caps capsule  Commonly known as:  LINZESS  Take 145 mcg by mouth daily.     linagliptin 5 MG Tabs tablet  Commonly known as:  TRADJENTA  Take 5 mg by mouth daily.     MELATONIN PO  Take 6 mg by mouth at bedtime.     metFORMIN 1000 MG tablet  Commonly known as:  GLUCOPHAGE  Take 1,000 mg by mouth 2 (two) times daily with a meal.     nitroGLYCERIN 0.4 MG SL tablet  Commonly known as:  NITROSTAT  Place 0.4 mg under the tongue every 5 (five) minutes as needed for chest pain.     oxyCODONE-acetaminophen 5-325 MG tablet  Commonly known as:  PERCOCET/ROXICET  Take 1 tablet by mouth 4 (four) times daily. For 30 days dispense 120     promethazine 12.5 MG tablet  Commonly known as:  PHENERGAN  Take 12.5 mg by mouth every 6 (six) hours as needed for nausea or vomiting.     rosuvastatin 40 MG tablet  Commonly known as:  CRESTOR  Take 40 mg by mouth every evening.     sertraline 100 MG tablet  Commonly known as:  ZOLOFT  Take 100 mg by mouth daily.     simethicone 80 MG chewable tablet  Commonly known as:  MYLICON  Chew 0000000 mg by mouth 3 (three) times daily.     spironolactone 25 MG tablet  Commonly known as:  ALDACTONE  Take 25 mg by mouth 2 (two) times daily.     SYSTANE ULTRA OP  Place 1 drop into both eyes at bedtime. And as needed     tiotropium 18 MCG inhalation capsule  Commonly known as:  SPIRIVA  Place 18 mcg into inhaler and inhale daily.        Immunization History  Administered Date(s) Administered   Influenza-Unspecified 06/19/2014, 06/22/2015   PPD Test 12/26/2013, 01/12/2014, 05/18/2015   Pneumococcal-Unspecified 07/02/2014   Pertinent  Health Maintenance Due  Topic Date Due   URINE MICROALBUMIN   08/13/2015   FOOT EXAM  08/20/2015   OPHTHALMOLOGY  EXAM  09/01/2015   HEMOGLOBIN A1C  11/24/2015   MAMMOGRAM  02/07/2016   INFLUENZA VACCINE  04/11/2016   PAP SMEAR  01/26/2017   COLONOSCOPY  02/07/2024   No flowsheet data found.  Filed Vitals:   10/08/15 1549  BP: 137/66  Pulse: 86  Temp: 97.8 F (36.6 C)  TempSrc: Oral  Resp: 18  SpO2: 98%   There is no weight on file to calculate BMI. Physical Exam  Constitutional: She is oriented to person, place, and time. She appears well-nourished.  Elderly in no acute distress.   HENT:  Head: Normocephalic.  Eyes: EOM are normal. Pupils are equal, round, and reactive to light. Right eye exhibits no discharge. Left eye exhibits no discharge. No scleral icterus.  Neck: Normal range of motion.  Cardiovascular: Normal rate and regular rhythm.  Exam reveals no gallop and no friction rub.   No murmur heard. Pulmonary/Chest: Effort normal and breath sounds normal.  Abdominal: Soft. Bowel sounds are normal. She exhibits no distension and no mass. There is no tenderness. There is no rebound and no guarding.  Musculoskeletal: She exhibits no edema or tenderness.  Left side weakness wears left wrist brace and Left leg brace.    Neurological: She is oriented to person, place, and time.  Skin: Skin is warm and dry.  Psychiatric: She has a normal mood and affect.    Labs reviewed:  Recent Labs  02/17/15 09/02/15  NA 138 139  K 3.5 4.2  BUN 15 19  CREATININE 0.7 0.7    Recent Labs  02/17/15 09/02/15  AST 13 22  ALT 12 16  ALKPHOS 94 113    Recent Labs  02/17/15 09/02/15  WBC 8.3 7.5  HGB 10.6* 11.5*  HCT 34* 38  PLT 221 270   Lab Results  Component Value Date   TSH 2.61 02/17/2015   Lab Results  Component Value Date   HGBA1C 9.3 09/14/2015   Lab Results  Component Value Date   CHOL 191 08/13/2014   HDL 30* 08/13/2014   LDLCALC 132 08/13/2014   TRIG 144 08/13/2014    Significant Diagnostic Results in last  30 days:  No results found.  Assessment/Plan 1. COPD Afebrile. No SOB,no increase in sputum production. Continue on current meds.   2. HTN  B/p within target goal range. Continue on current meds and monitor B/p  3. Hypothyroidism  Currently on Levothyroxine 25 mcg Tablet. Recheck TSH level   4. Constipation current regimen effective. Continue to monitor.     Family/ staff Communication: Reviewed Plan of care with facility staff.   Labs/tests ordered: TSH level 10/11/2015

## 2015-10-11 DIAGNOSIS — E038 Other specified hypothyroidism: Secondary | ICD-10-CM | POA: Diagnosis not present

## 2015-10-11 LAB — TSH: TSH: 1.69 u[IU]/mL (ref 0.41–5.90)

## 2015-10-12 ENCOUNTER — Ambulatory Visit: Payer: Medicare Other | Admitting: Podiatry

## 2015-10-12 DIAGNOSIS — G894 Chronic pain syndrome: Secondary | ICD-10-CM | POA: Diagnosis not present

## 2015-10-12 DIAGNOSIS — M797 Fibromyalgia: Secondary | ICD-10-CM | POA: Diagnosis not present

## 2015-10-12 DIAGNOSIS — M792 Neuralgia and neuritis, unspecified: Secondary | ICD-10-CM | POA: Diagnosis not present

## 2015-10-12 DIAGNOSIS — Z79891 Long term (current) use of opiate analgesic: Secondary | ICD-10-CM | POA: Diagnosis not present

## 2015-10-12 DIAGNOSIS — M545 Low back pain: Secondary | ICD-10-CM | POA: Diagnosis not present

## 2015-10-12 DIAGNOSIS — Z79899 Other long term (current) drug therapy: Secondary | ICD-10-CM | POA: Diagnosis not present

## 2015-10-18 ENCOUNTER — Other Ambulatory Visit: Payer: Self-pay

## 2015-10-18 MED ORDER — OXYCODONE-ACETAMINOPHEN 5-325 MG PO TABS: 1.0000 | ORAL_TABLET | Freq: Four times a day (QID) | ORAL | Status: DC

## 2015-10-18 NOTE — Telephone Encounter (Signed)
Rx faxed to [facility] @ [phone], phone number [phone]  

## 2015-10-25 ENCOUNTER — Encounter: Payer: Self-pay | Admitting: Podiatry

## 2015-10-25 ENCOUNTER — Ambulatory Visit (INDEPENDENT_AMBULATORY_CARE_PROVIDER_SITE_OTHER): Payer: Medicare Other | Admitting: Podiatry

## 2015-10-25 VITALS — BP 136/67 | HR 96 | Resp 14

## 2015-10-25 DIAGNOSIS — R2689 Other abnormalities of gait and mobility: Principal | ICD-10-CM

## 2015-10-25 DIAGNOSIS — M21379 Foot drop, unspecified foot: Secondary | ICD-10-CM

## 2015-10-25 DIAGNOSIS — R261 Paralytic gait: Secondary | ICD-10-CM | POA: Diagnosis not present

## 2015-10-25 DIAGNOSIS — M2040 Other hammer toe(s) (acquired), unspecified foot: Secondary | ICD-10-CM | POA: Diagnosis not present

## 2015-10-25 NOTE — Progress Notes (Signed)
Patient ID: Susan [name], female   DOB: January 06, 1951, 65 y.o.   MRN: [id number]  Subjective: 65 year old female presents the office today for concerns of flexion contractures to her digits on her left hallux and second toe. She says she cannot be her toes up and therefore she cannot wear regular shoes and she is having quite a bit of pain to her big toe. Denies any history of injury or trauma. No swelling or redness that she has noted. She states that she had an ultrasound to evaluate for peripheral arterial disease and she does not have the results. Denies any systemic complaints such as fevers, chills, nausea, vomiting. No acute changes since last appointment, and no other complaints at this time.   Objective: AAO x3, NAD; presents in a wheelchair.  DP/PT pulses decreased bilaterally, CRT delayed Protective sensation decreased with Derrel Nip monofilament There appears to be dropfoot deformity to the left side. There is also plantar flexion of the hallux and second toe at the level of the MPJ. She is able to dorsiflex. There is pain with range of motion of the first MTPJ. There is no edema, erythema. No areas of pinpoint bony tenderness or pain with vibratory sensation.  No edema, erythema, increase in warmth to bilateral lower extremities.  No open lesions or pre-ulcerative lesions.  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  -Will request copies of the arterial studies. Also ordered x-rays today. Also recommend her to bring her AFO with her to her next appointment. She would like to have surgical intervention however given her circulatory status as well as her other medical conditions likely that she may not be a good candidate meshes medically cleared. Also not sure by doing surgery this will improve her  function. -Follow-up in 3 weeks. -Patient encouraged to call the office with any questions, concerns, change in symptoms.   Celesta Gentile, DPM

## 2015-10-26 DIAGNOSIS — M25579 Pain in unspecified ankle and joints of unspecified foot: Secondary | ICD-10-CM | POA: Diagnosis not present

## 2015-10-28 ENCOUNTER — Telehealth: Payer: Self-pay | Admitting: *Deleted

## 2015-10-28 NOTE — Telephone Encounter (Addendum)
-----   Message from Trula Slade, DPM sent at 10/25/2015  5:30 PM EST ----- Can we call her facitily and see if we can get a copy of her ultrasound that she said she had for "PAD". The lady that makes appointments is [facility]. Her number is [phone]. Also, I ordered an x-ray of the left foot. She said they can do x-rays at her facility. Given her condition we could not get x-rays here. 02/16/29017-LEFT MESSAGE FOR DIRECTOR OF NURSING requesting copy of PAD test results, and x-rays of left foot.  11/19/2015-CHERLY - QUALITY MOBILE X-RAY STATES since pt is at [facility] she will need a medical records release from them to send copy of x-ray disc.  I called [facility] and left message in Medical Record to fax medical release forms need to get x-ray disc copy.

## 2015-11-05 ENCOUNTER — Non-Acute Institutional Stay (SKILLED_NURSING_FACILITY): Payer: Medicare Other | Admitting: Family

## 2015-11-05 DIAGNOSIS — K5901 Slow transit constipation: Secondary | ICD-10-CM

## 2015-11-05 DIAGNOSIS — E039 Hypothyroidism, unspecified: Secondary | ICD-10-CM

## 2015-11-05 DIAGNOSIS — J449 Chronic obstructive pulmonary disease, unspecified: Secondary | ICD-10-CM

## 2015-11-05 DIAGNOSIS — IMO0002 Reserved for concepts with insufficient information to code with codable children: Secondary | ICD-10-CM

## 2015-11-05 DIAGNOSIS — I110 Hypertensive heart disease with heart failure: Secondary | ICD-10-CM

## 2015-11-05 DIAGNOSIS — E1165 Type 2 diabetes mellitus with hyperglycemia: Secondary | ICD-10-CM

## 2015-11-05 DIAGNOSIS — E1140 Type 2 diabetes mellitus with diabetic neuropathy, unspecified: Secondary | ICD-10-CM | POA: Diagnosis not present

## 2015-11-05 DIAGNOSIS — Z794 Long term (current) use of insulin: Secondary | ICD-10-CM | POA: Diagnosis not present

## 2015-11-05 NOTE — Progress Notes (Signed)
Patient ID: Susan [name], female   DOB: [birth year], 65 y.o.   MRN: [id number]  Location:  [facility]:  SNF (31) Provider:  Blanchie Serve, MD  Patient Care Team: Blanchie Serve, MD as PCP - General (Internal Medicine) Gerlene Fee, NP as Nurse Practitioner (Nurse Practitioner)  Extended Emergency Contact Information Primary Emergency Contact: Roblstow,Beth Address: [address]          [place], [place] [address] Johnnette Litter of [place] Phone: [phone] Work Phone: [phone] Mobile Phone: [phone] Relation: Daughter Secondary Emergency Contact: Goldbach,Scott Address: [address]          [place], [place] [address] Johnnette Litter of [place] Phone: [phone] Mobile Phone: [phone] Relation: Spouse  Code Status: DNR  Goals of care: Advanced Directive information Advanced Directives 10/08/2015  Does patient have an advance directive? Yes  Type of Advance Directive Out of facility DNR (pink MOST or yellow form)  Does patient want to make changes to advanced directive? No - Patient declined  Copy of advanced directive(s) in chart? Yes     Chief Complaint  Patient presents with  . Medical Management of Chronic Issues    HPI:  Pt is a 65 y.o. female seen today  At [facility] for medical management of chronic diseases. She has a medical history of HTN, CHF, COPD, Hypothyroidism amongst others. She is seen in her room today.  Facility staff reports no concerns. CBG's averages 120's-200's in AM and 130's -300's for PM. She denies any acute issues this visit.   Past Medical History  Diagnosis Date  . Hyperlipidemia   . Diabetes mellitus without complication ([place])   . COPD (chronic obstructive pulmonary disease) ([place])   . Depression   . Hypertension   . Thyroid disease   . Anemia   . CAD (coronary artery disease)   . Chronic pain   . Hemiplegia affecting non-dominant side, post-stroke  ([place])   . Fall   . Urine retention   . Stroke [facility]) 12/2013    left side paralysis   Past Surgical History  Procedure Laterality Date  . Joint replacement      bil hip  . Abdominal hysterectomy    . Cholecystectomy    . Appendectomy    . Tonsillectomy      Allergies  Allergen Reactions  . Onion Shortness Of Breath and Swelling  . Pollen Extract Itching  . Morphine And Related Hives    PT DENIES ALLERGY  . Niaspan [Niacin Er] Other (See Comments)    unknown  . Other     onions  . Trazodone And Nefazodone Other (See Comments)    Hallucinations  . Valium [Diazepam] Other (See Comments)    unknown  . Wellbutrin [Bupropion] Other (See Comments)    unknown      Medication List       This list is accurate as of: 11/05/15  5:26 PM.  Always use your most recent med list.               ALBUTEROL SULFATE PO  Take by mouth. Every 8 as needed for wheezing     aspirin 81 MG chewable tablet  Chew 81 mg by mouth daily.     baclofen 10 MG tablet  Commonly known as:  LIORESAL  Take 10 mg by mouth 3 (three) times daily.     clonazePAM 0.5 MG tablet  Commonly known as:  KLONOPIN  Take one tablet by mouth every 8 hours as needed for anxiety     cyclobenzaprine 5 MG tablet  Commonly known as:  FLEXERIL  Take 5 mg by mouth 2 (two) times daily.     docusate sodium 100 MG capsule  Commonly known as:  COLACE  Take 2 capsules (200 mg total) by mouth 2 (two) times daily.     Fish Oil 1000 MG Caps  Take 2 capsules by mouth daily.     fluticasone 50 MCG/ACT nasal spray  Commonly known as:  FLONASE  Place 2 sprays into both nostrils daily.     furosemide 20 MG tablet  Commonly known as:  LASIX  Take 20 mg by mouth daily.     glimepiride 2 MG tablet  Commonly known as:  AMARYL  Take 2 mg by mouth daily with breakfast.     hydrOXYzine 25 MG tablet  Commonly known as:  ATARAX/VISTARIL  Take 25 mg by mouth every 8 (eight) hours as needed for itching.     Insulin  Glargine 300 UNIT/ML Sopn  Commonly known as:  TOUJEO SOLOSTAR  Inject 120 Units into the skin at bedtime.     insulin lispro 100 UNIT/ML injection  Commonly known as:  HUMALOG  Inject 30 Units into the skin 3 (three) times daily before meals. For CBG >/= 150 inject 10 additional units subcutaneously     isosorbide mononitrate 30 MG 24 hr tablet  Commonly known as:  IMDUR  Take 30 mg by mouth daily.     levothyroxine 25 MCG tablet  Commonly known as:  SYNTHROID, LEVOTHROID  Take 25 mcg by mouth daily before breakfast.     Linaclotide 145 MCG Caps capsule  Commonly known as:  LINZESS  Take 145 mcg by mouth daily.     linagliptin 5 MG Tabs tablet  Commonly known as:  TRADJENTA  Take 5 mg by mouth daily.     MELATONIN PO  Take 6 mg by mouth at bedtime.     metFORMIN 1000 MG tablet  Commonly known as:  GLUCOPHAGE  Take 1,000 mg by mouth 2 (two) times daily with a meal.     nitroGLYCERIN 0.4 MG SL tablet  Commonly known as:  NITROSTAT  Place 0.4 mg under the tongue every 5 (five) minutes as needed for chest pain.     oxyCODONE-acetaminophen 5-325 MG tablet  Commonly known as:  PERCOCET/ROXICET  Take 1 tablet by mouth 4 (four) times daily. DO NOT EXCEED 4GM OF APAP IN 24 HOURS FROM ALL SOURCES     promethazine 12.5 MG tablet  Commonly known as:  PHENERGAN  Take 12.5 mg by mouth every 6 (six) hours as needed for nausea or vomiting.     rosuvastatin 40 MG tablet  Commonly known as:  CRESTOR  Take 40 mg by mouth every evening.     sertraline 100 MG tablet  Commonly known as:  ZOLOFT  Take 100 mg by mouth daily.     simethicone 80 MG chewable tablet  Commonly known as:  MYLICON  Chew 0000000 mg by mouth 3 (three) times daily.     spironolactone 25 MG tablet  Commonly known as:  ALDACTONE  Take 25 mg by mouth 2 (two) times daily.     SYSTANE ULTRA OP  Place 1 drop into both eyes at bedtime. And as needed     tiotropium 18 MCG inhalation capsule  Commonly known as:   Delta 18  mcg into inhaler and inhale daily.        Review of Systems  Constitutional: Negative for fever, chills, activity change, appetite change and fatigue.  HENT: Negative.   Eyes: Negative.   Respiratory: Negative.   Cardiovascular: Negative.   Gastrointestinal: Negative.   Endocrine: Negative.   Genitourinary:       Incontinent   Musculoskeletal: Negative.   Skin: Negative.   Allergic/Immunologic: Negative.   Neurological: Negative.   Hematological: Negative.   Psychiatric/Behavioral: Negative.     Immunization History  Administered Date(s) Administered  . Influenza-Unspecified 06/19/2014, 06/22/2015  . PPD Test 12/26/2013, 01/12/2014, 05/18/2015  . Pneumococcal-Unspecified 07/02/2014   Pertinent  Health Maintenance Due  Topic Date Due  . URINE MICROALBUMIN  08/13/2015  . FOOT EXAM  08/20/2015  . OPHTHALMOLOGY EXAM  09/01/2015  . MAMMOGRAM  02/07/2016  . HEMOGLOBIN A1C  03/13/2016  . INFLUENZA VACCINE  04/11/2016  . PAP SMEAR  01/26/2017  . COLONOSCOPY  02/07/2024   No flowsheet data found. Functional Status Survey:    Filed Vitals:   11/05/15 1700  BP: 128/78  Pulse: 68  Temp: 97.5 F (36.4 C)  Resp: 20  Weight: 195 lb 4.8 oz (88.587 kg)  SpO2: 96%   Body mass index is 34.6 kg/(m^2). Physical Exam  Constitutional: She is oriented to person, place, and time. She appears well-developed and well-nourished. No distress.  HENT:  Head: Normocephalic.  Mouth/Throat: Oropharynx is clear and moist.  Eyes: Conjunctivae and EOM are normal. Pupils are equal, round, and reactive to light. Right eye exhibits no discharge. Left eye exhibits no discharge. No scleral icterus.  Neck: Normal range of motion. No JVD present. No tracheal deviation present. No thyromegaly present.  Cardiovascular: Normal rate, regular rhythm, normal heart sounds and intact distal pulses.  Exam reveals no gallop and no friction rub.   No murmur heard. Pulmonary/Chest: Effort  normal and breath sounds normal. No respiratory distress. She has no wheezes. She has no rales. She exhibits no tenderness.  Abdominal: Soft. Bowel sounds are normal. She exhibits no distension and no mass. There is no tenderness. There is no rebound and no guarding.  Musculoskeletal: She exhibits no edema or tenderness.  Left foot drop   Lymphadenopathy:    She has no cervical adenopathy.  Neurological: She is oriented to person, place, and time.  Skin: Skin is warm and dry. No rash noted. No erythema. No pallor.  Psychiatric: She has a normal mood and affect.    Labs reviewed:  Recent Labs  02/17/15 09/02/15  NA 138 139  K 3.5 4.2  BUN 15 19  CREATININE 0.7 0.7    Recent Labs  02/17/15 09/02/15  AST 13 22  ALT 12 16  ALKPHOS 94 113    Recent Labs  02/17/15 09/02/15  WBC 8.3 7.5  HGB 10.6* 11.5*  HCT 34* 38  PLT 221 270   Lab Results  Component Value Date   TSH 2.61 02/17/2015   Lab Results  Component Value Date   HGBA1C 9.3 09/14/2015   Lab Results  Component Value Date   CHOL 191 08/13/2014   HDL 30* 08/13/2014   LDLCALC 132 08/13/2014   TRIG 144 08/13/2014    Significant Diagnostic Results in last 30 days:  No results found.  Assessment/Plan 1. Hypertensive heart disease with heart failure (HCC) B/p stable. Continue imdur, Spironolactone   2. Chronic obstructive pulmonary disease, unspecified COPD type ([place]) Stable.Continue on Spiriva and Albuterol PRN   3.  Slow transit constipation Current regimen effective.   4. Uncontrolled type 2 diabetes mellitus with diabetic neuropathy, with long-term current use of insulin (HCC) CBG's averages 130's -300's. Recent Hgb A1C 9.3 (10/11/2015). Continue on Glimepiride 2 mg Tablet , Toujeo 120 units SQ QHS and Metformin 1000 mg Tablet twice daily. Increase Humalog to 34 units SQ three times with meals. Monitor Hgb A1C  5. Hypothyroidism, unspecified hypothyroidism type Recent TSH 1.689 ( 10/11/2015) Continue on  Levothyroxine 25 mcg Tablet. Monitor TSH    Family/ staff Communication: Reviewed plan of care with patient and facility Nurse.   Labs/tests ordered: None

## 2015-11-10 DIAGNOSIS — M79673 Pain in unspecified foot: Secondary | ICD-10-CM | POA: Diagnosis not present

## 2015-11-10 DIAGNOSIS — M792 Neuralgia and neuritis, unspecified: Secondary | ICD-10-CM | POA: Diagnosis not present

## 2015-11-10 DIAGNOSIS — G894 Chronic pain syndrome: Secondary | ICD-10-CM | POA: Diagnosis not present

## 2015-11-10 DIAGNOSIS — Z79899 Other long term (current) drug therapy: Secondary | ICD-10-CM | POA: Diagnosis not present

## 2015-11-10 DIAGNOSIS — M545 Low back pain: Secondary | ICD-10-CM | POA: Diagnosis not present

## 2015-11-16 ENCOUNTER — Encounter: Payer: Self-pay | Admitting: Sports Medicine

## 2015-11-16 ENCOUNTER — Ambulatory Visit (INDEPENDENT_AMBULATORY_CARE_PROVIDER_SITE_OTHER): Payer: Medicare Other | Admitting: Sports Medicine

## 2015-11-16 DIAGNOSIS — E1165 Type 2 diabetes mellitus with hyperglycemia: Secondary | ICD-10-CM

## 2015-11-16 DIAGNOSIS — M21372 Foot drop, left foot: Secondary | ICD-10-CM

## 2015-11-16 DIAGNOSIS — M79671 Pain in right foot: Secondary | ICD-10-CM

## 2015-11-16 DIAGNOSIS — I69354 Hemiplegia and hemiparesis following cerebral infarction affecting left non-dominant side: Secondary | ICD-10-CM | POA: Diagnosis not present

## 2015-11-16 DIAGNOSIS — G894 Chronic pain syndrome: Secondary | ICD-10-CM | POA: Diagnosis not present

## 2015-11-16 DIAGNOSIS — M79672 Pain in left foot: Secondary | ICD-10-CM | POA: Diagnosis not present

## 2015-11-16 DIAGNOSIS — E1140 Type 2 diabetes mellitus with diabetic neuropathy, unspecified: Secondary | ICD-10-CM

## 2015-11-16 DIAGNOSIS — Z79891 Long term (current) use of opiate analgesic: Secondary | ICD-10-CM | POA: Diagnosis not present

## 2015-11-16 NOTE — Progress Notes (Signed)
Patient ID: Lindie Steensma, female   DOB: 09/26/1950, 65 y.o.   MRN: [id number] Subjective: Nashara Piersol is a 65 y.o. female patient who returns to office for evaluation of bilateral foot pain. Patient also complains that the left foot is dropped and has a hard time wearing a shoe; Patient states that since her stroke she is weak on left side and has developed foot drop and DOES NOT WALK; Spends all of her time in bed or a Geriatric Chair. Patient desires something for the pain in both feet. Patient resides at [facility].   Patient Active Problem List   Diagnosis Date Noted  . CHF, stage C ([place]) 04/13/2015  . Major depression, chronic ([place]) 04/13/2015  . Uncontrolled diabetes mellitus ([place]) 12/14/2014  . Chronic idiopathic constipation 12/14/2014  . Hypertensive heart disease with heart failure ([place]) 12/14/2014  . Hyperlipidemia with target LDL less than 100 12/14/2014  . Abdominal distention 03/12/2014  . Anemia 02/24/2014  . RSD (reflex sympathetic dystrophy) 02/23/2014  . Edema 02/06/2014  . Insomnia 02/02/2014  . Type II diabetes mellitus with neurological manifestations, uncontrolled ([place]) 01/14/2014  . CVA (cerebral vascular accident) ([place]) 12/30/2013  . Hemiparesis affecting left side as late effect of cerebrovascular accident ([place]) 12/30/2013  . COPD (chronic obstructive pulmonary disease) ([place]) 12/30/2013  . CAD (coronary artery disease) 12/30/2013  . Hypothyroidism 12/30/2013  . Dyslipidemia 12/30/2013  . Neuropathic pain syndrome (non-herpetic) 12/30/2013  . Anxiety 12/30/2013  . Constipation 12/30/2013  . Smoker 12/30/2013    Current Outpatient Prescriptions on File Prior to Visit  Medication Sig Dispense Refill  . ALBUTEROL SULFATE PO Take by mouth. Every 8 as needed for wheezing    . aspirin 81 MG chewable tablet Chew 81 mg by mouth daily.    . baclofen (LIORESAL) 10 MG tablet Take 10 mg by mouth 3 (three) times daily.     . clonazePAM (KLONOPIN) 0.5 MG tablet Take  one tablet by mouth every 8 hours as needed for anxiety 90 tablet 5  . cyclobenzaprine (FLEXERIL) 5 MG tablet Take 5 mg by mouth 2 (two) times daily.    Marland Kitchen docusate sodium (COLACE) 100 MG capsule Take 2 capsules (200 mg total) by mouth 2 (two) times daily. 100 capsule 11  . fluticasone (FLONASE) 50 MCG/ACT nasal spray Place 2 sprays into both nostrils daily.    . furosemide (LASIX) 20 MG tablet Take 20 mg by mouth daily.    Marland Kitchen glimepiride (AMARYL) 2 MG tablet Take 2 mg by mouth daily with breakfast.    . hydrOXYzine (ATARAX/VISTARIL) 25 MG tablet Take 25 mg by mouth every 8 (eight) hours as needed for itching.    . Insulin Glargine (TOUJEO SOLOSTAR) 300 UNIT/ML SOPN Inject 120 Units into the skin at bedtime. 1 pen prn  . insulin lispro (HUMALOG) 100 UNIT/ML injection Inject 30 Units into the skin 3 (three) times daily before meals. For CBG >/= 150 inject 10 additional units subcutaneously    . isosorbide mononitrate (IMDUR) 30 MG 24 hr tablet Take 30 mg by mouth daily.    Marland Kitchen levothyroxine (SYNTHROID, LEVOTHROID) 25 MCG tablet Take 25 mcg by mouth daily before breakfast.    . Linaclotide (LINZESS) 145 MCG CAPS capsule Take 145 mcg by mouth daily.    Marland Kitchen linagliptin (TRADJENTA) 5 MG TABS tablet Take 5 mg by mouth daily.    Marland Kitchen MELATONIN PO Take 6 mg by mouth at bedtime.    . metFORMIN (GLUCOPHAGE) 1000 MG tablet Take 1,000 mg  by mouth 2 (two) times daily with a meal.    . nitroGLYCERIN (NITROSTAT) 0.4 MG SL tablet Place 0.4 mg under the tongue every 5 (five) minutes as needed for chest pain.    . Omega-3 Fatty Acids (FISH OIL) 1000 MG CAPS Take 2 capsules by mouth daily.    Marland Kitchen oxyCODONE-acetaminophen (PERCOCET/ROXICET) 5-325 MG tablet Take 1 tablet by mouth 4 (four) times daily. DO NOT EXCEED 4GM OF APAP IN 24 HOURS FROM ALL SOURCES 180 tablet 0  . Polyethyl Glycol-Propyl Glycol (SYSTANE ULTRA OP) Place 1 drop into both eyes at bedtime. And as needed    . promethazine (PHENERGAN) 12.5 MG tablet Take 12.5 mg  by mouth every 6 (six) hours as needed for nausea or vomiting.    . rosuvastatin (CRESTOR) 40 MG tablet Take 40 mg by mouth every evening.     . sertraline (ZOLOFT) 100 MG tablet Take 100 mg by mouth daily.    . simethicone (MYLICON) 80 MG chewable tablet Chew 160 mg by mouth 3 (three) times daily.     Marland Kitchen spironolactone (ALDACTONE) 25 MG tablet Take 25 mg by mouth 2 (two) times daily.     Marland Kitchen tiotropium (SPIRIVA) 18 MCG inhalation capsule Place 18 mcg into inhaler and inhale daily.     No current facility-administered medications on file prior to visit.    Allergies  Allergen Reactions  . Onion Shortness Of Breath and Swelling  . Pollen Extract Itching  . Morphine And Related Hives    PT DENIES ALLERGY  . Niaspan [Niacin Er] Other (See Comments)    unknown  . Other     onions  . Trazodone And Nefazodone Other (See Comments)    Hallucinations  . Valium [Diazepam] Other (See Comments)    unknown  . Wellbutrin [Bupropion] Other (See Comments)    unknown    Objective:  General: Alert and oriented x3 in no acute distress  Dermatology: No open lesions bilateral lower extremities, no webspace macerations, no ecchymosis bilateral, all nails x 10 are well manicured.  Vascular: Dorsalis Pedis and Posterior Tibial pedal pulses 1/4 faint, CRT 5 seconds, Temperature decreased bilateral.  Neurology: Gross sensation intact via light touch bilateral, Protective sensation severely diminished   with Semmes Weinstein Monofilament to all pedal sites, vibratory absent  Bilateral.  Musculoskeletal: No tenderness with palpation diffusely bilateral,No pain with calf compression bilateral. There is drop foot on left. No deformity on right. Strength 3/5 on left and 4/5 on right.   Xrays On CD Left Foot   Impression: No acute findings   Assessment and Plan: Problem List Items Addressed This Visit      Endocrine   Type II diabetes mellitus with neurological manifestations, uncontrolled ([place])      Nervous and Auditory   Hemiparesis affecting left side as late effect of cerebrovascular accident [facility])    Other Visit Diagnoses    Foot drop, left    -  Primary    Foot pain, bilateral           -Complete examination performed -Xrays reviewed -Discussed treatement options; due to patient's non-ambulatory status and co-morbidities do not recommend extensive bracing or surgical intervention at this time -Recommend Capsiacin cream daily to both feet for pain -Continue with current medication management; patient is on Gabapentin as well as Norco -Patient to return to office as needed or sooner if condition worsens.  Landis Martins, DPM

## 2015-12-02 ENCOUNTER — Non-Acute Institutional Stay (SKILLED_NURSING_FACILITY): Payer: Medicare Other | Admitting: Family

## 2015-12-02 DIAGNOSIS — E1140 Type 2 diabetes mellitus with diabetic neuropathy, unspecified: Secondary | ICD-10-CM | POA: Diagnosis not present

## 2015-12-02 DIAGNOSIS — F329 Major depressive disorder, single episode, unspecified: Secondary | ICD-10-CM | POA: Diagnosis not present

## 2015-12-02 DIAGNOSIS — I639 Cerebral infarction, unspecified: Secondary | ICD-10-CM

## 2015-12-02 DIAGNOSIS — J449 Chronic obstructive pulmonary disease, unspecified: Secondary | ICD-10-CM

## 2015-12-02 DIAGNOSIS — E039 Hypothyroidism, unspecified: Secondary | ICD-10-CM | POA: Diagnosis not present

## 2015-12-02 DIAGNOSIS — M792 Neuralgia and neuritis, unspecified: Secondary | ICD-10-CM | POA: Diagnosis not present

## 2015-12-02 DIAGNOSIS — Z794 Long term (current) use of insulin: Secondary | ICD-10-CM | POA: Diagnosis not present

## 2015-12-02 DIAGNOSIS — I110 Hypertensive heart disease with heart failure: Secondary | ICD-10-CM | POA: Diagnosis not present

## 2015-12-02 DIAGNOSIS — I509 Heart failure, unspecified: Secondary | ICD-10-CM

## 2015-12-02 LAB — HM PAP SMEAR

## 2015-12-02 LAB — HM DEXA SCAN

## 2015-12-02 LAB — HM MAMMOGRAPHY

## 2015-12-02 NOTE — Progress Notes (Signed)
Patient ID: Susan [name], female   DOB: 1950-12-09, 65 y.o.   MRN: [id number]  Location:  [place]:  SNF (31) Provider:  Blanchie Serve, MD  Patient Care Team: Blanchie Serve, MD as PCP - General (Internal Medicine) Gerlene Fee, NP as Nurse Practitioner (Nurse Practitioner)  Extended Emergency Contact Information Primary Emergency Contact: Roblstow,Beth Address: [address]          [place], [place] [address] Johnnette Litter of [place] Phone: [phone] Work Phone: [phone] Mobile Phone: [phone] Relation: Daughter Secondary Emergency Contact: Folts,Scott Address: [address]          [place],  [address] [place]nette Litter of [place] Phone: [phone] Mobile Phone: [phone] Relation: Spouse  Code Status: DNR Goals of care: Advanced Directive information Advanced Directives 10/08/2015  Does patient have an advance directive? Yes  Type of Advance Directive Out of facility DNR (pink MOST or yellow form)  Does patient want to make changes to advanced directive? No - Patient declined  Copy of advanced directive(s) in chart? Yes     Chief Complaint  Patient presents with  . Medical Management of Chronic Issues    HPI:  Pt is a 65 y.o. female seen today [facility] for medical management of chronic diseases. She has a medical history of HTN, CHF, COPD, Hypothyroidism, Depression, Type 2 DM, diabetic Neuropathy among others. She is seen today in her room in bed watching TV. She continues to complain of neuropathic pain on the legs recently started on Gabapentin 400 mg  One at lunch two at supper and 3 at bedtime. CBG's 170's-200's. She denies any fever, chills, or cough.Facility staff reports no new concerns.    Past Medical History  Diagnosis Date  . Hyperlipidemia   . Diabetes mellitus without complication ([place])   . COPD (chronic obstructive pulmonary disease) ([place])   . Depression    . Hypertension   . Thyroid disease   . Anemia   . CAD (coronary artery disease)   . Chronic pain   . Hemiplegia affecting non-dominant side, post-stroke ([place])   . Fall   . Urine retention   . Stroke [facility]) 12/2013    left side paralysis   Past Surgical History  Procedure Laterality Date  . Joint replacement      bil hip  . Abdominal hysterectomy    . Cholecystectomy    . Appendectomy    . Tonsillectomy      Allergies  Allergen Reactions  . Onion Shortness Of Breath and Swelling  . Pollen Extract Itching  . Morphine And Related Hives    PT DENIES ALLERGY  . Niaspan [Niacin Er] Other (See Comments)    unknown  . Other     onions  . Trazodone And Nefazodone Other (See Comments)    Hallucinations  . Valium [Diazepam] Other (See Comments)    unknown  . Wellbutrin [Bupropion] Other (See Comments)    unknown      Medication List       This list is accurate as of: 12/02/15  6:29 PM.  Always use your most recent med list.               ALBUTEROL SULFATE PO  Take by mouth. Every 8 as needed for wheezing     aspirin 81 MG chewable tablet  Chew 81 mg by mouth daily.     baclofen 10 MG tablet  Commonly  known as:  LIORESAL  Take 10 mg by mouth 3 (three) times daily.     clonazePAM 0.5 MG tablet  Commonly known as:  KLONOPIN  Take one tablet by mouth every 8 hours as needed for anxiety     cyclobenzaprine 5 MG tablet  Commonly known as:  FLEXERIL  Take 5 mg by mouth 2 (two) times daily.     docusate sodium 100 MG capsule  Commonly known as:  COLACE  Take 2 capsules (200 mg total) by mouth 2 (two) times daily.     Fish Oil 1000 MG Caps  Take 2 capsules by mouth daily.     fluticasone 50 MCG/ACT nasal spray  Commonly known as:  FLONASE  Place 2 sprays into both nostrils daily.     furosemide 20 MG tablet  Commonly known as:  LASIX  Take 20 mg by mouth daily.     gabapentin 400 MG capsule  Commonly known as:  NEURONTIN  Take 400 mg by mouth 3 (three)  times daily. One lunch, two supper, 3 at bedtime.     glimepiride 2 MG tablet  Commonly known as:  AMARYL  Take 2 mg by mouth daily with breakfast.     hydrOXYzine 25 MG tablet  Commonly known as:  ATARAX/VISTARIL  Take 25 mg by mouth every 8 (eight) hours as needed for itching.     Insulin Glargine 300 UNIT/ML Sopn  Commonly known as:  TOUJEO SOLOSTAR  Inject 120 Units into the skin at bedtime.     insulin lispro 100 UNIT/ML injection  Commonly known as:  HUMALOG  Inject 30 Units into the skin 3 (three) times daily before meals. For CBG >/= 150 inject 10 additional units subcutaneously     isosorbide mononitrate 30 MG 24 hr tablet  Commonly known as:  IMDUR  Take 30 mg by mouth daily.     levothyroxine 25 MCG tablet  Commonly known as:  SYNTHROID, LEVOTHROID  Take 25 mcg by mouth daily before breakfast.     Linaclotide 145 MCG Caps capsule  Commonly known as:  LINZESS  Take 145 mcg by mouth daily.     linagliptin 5 MG Tabs tablet  Commonly known as:  TRADJENTA  Take 5 mg by mouth daily.     MELATONIN PO  Take 6 mg by mouth at bedtime.     metFORMIN 1000 MG tablet  Commonly known as:  GLUCOPHAGE  Take 1,000 mg by mouth 2 (two) times daily with a meal.     mirtazapine 30 MG tablet  Commonly known as:  REMERON  Take 30 mg by mouth at bedtime.     nitroGLYCERIN 0.4 MG SL tablet  Commonly known as:  NITROSTAT  Place 0.4 mg under the tongue every 5 (five) minutes as needed for chest pain.     oxyCODONE-acetaminophen 5-325 MG tablet  Commonly known as:  PERCOCET/ROXICET  Take 1 tablet by mouth 4 (four) times daily. DO NOT EXCEED 4GM OF APAP IN 24 HOURS FROM ALL SOURCES     promethazine 12.5 MG tablet  Commonly known as:  PHENERGAN  Take 12.5 mg by mouth every 6 (six) hours as needed for nausea or vomiting.     rosuvastatin 40 MG tablet  Commonly known as:  CRESTOR  Take 40 mg by mouth every evening.     sertraline 100 MG tablet  Commonly known as:  ZOLOFT    Take 100 mg by mouth daily.     simethicone  80 MG chewable tablet  Commonly known as:  MYLICON  Chew 0000000 mg by mouth 3 (three) times daily.     spironolactone 25 MG tablet  Commonly known as:  ALDACTONE  Take 25 mg by mouth 2 (two) times daily.     SYSTANE ULTRA OP  Place 1 drop into both eyes at bedtime. And as needed     tiotropium 18 MCG inhalation capsule  Commonly known as:  SPIRIVA  Place 18 mcg into inhaler and inhale daily.        Review of Systems  Constitutional: Negative for fever, chills, activity change, appetite change and fatigue.  HENT: Negative.   Eyes: Negative.   Respiratory: Negative.   Cardiovascular: Negative.   Gastrointestinal: Negative.   Endocrine: Negative.   Genitourinary:       Incontinent   Musculoskeletal: Negative.   Skin: Negative.   Allergic/Immunologic: Negative.   Neurological: Positive for weakness.       Lower extremities.   Hematological: Negative.   Psychiatric/Behavioral: Negative.     Immunization History  Administered Date(s) Administered  . Influenza-Unspecified 06/19/2014, 06/22/2015  . PPD Test 12/26/2013, 01/12/2014, 05/18/2015  . Pneumococcal-Unspecified 07/02/2014   Pertinent  Health Maintenance Due  Topic Date Due  . URINE MICROALBUMIN  08/13/2015  . FOOT EXAM  08/20/2015  . OPHTHALMOLOGY EXAM  09/01/2015  . MAMMOGRAM  02/07/2016  . HEMOGLOBIN A1C  03/13/2016  . INFLUENZA VACCINE  04/11/2016  . PAP SMEAR  01/26/2017  . COLONOSCOPY  02/07/2024   No flowsheet data found. Functional Status Survey:    Filed Vitals:   12/02/15 1810  BP: 131/79  Pulse: 68  Temp: 97.9 F (36.6 C)  Resp: 18  Height: 5\' 3"  (1.6 m)  Weight: 199 lb 1.6 oz (90.311 kg)  SpO2: 98%   Body mass index is 35.28 kg/(m^2). Physical Exam  Constitutional: She is oriented to person, place, and time. She appears well-developed and well-nourished. No distress.  HENT:  Head: Normocephalic.  Mouth/Throat: Oropharynx is clear and  moist.  Eyes: Conjunctivae and EOM are normal. Pupils are equal, round, and reactive to light. Right eye exhibits no discharge. Left eye exhibits no discharge. No scleral icterus.  Neck: Normal range of motion. No JVD present. No thyromegaly present.  Cardiovascular: Normal rate, regular rhythm, normal heart sounds and intact distal pulses.  Exam reveals no gallop and no friction rub.   No murmur heard. Pulmonary/Chest: Effort normal and breath sounds normal. No respiratory distress. She has no wheezes. She has no rales.  Abdominal: Soft. Bowel sounds are normal. She exhibits no distension and no mass. There is no tenderness. There is no rebound and no guarding.  Musculoskeletal: She exhibits no edema or tenderness.  Left foot drop   Lymphadenopathy:    She has no cervical adenopathy.  Neurological: She is oriented to person, place, and time.  Lower extremity paralysis   Skin: Skin is warm and dry. No rash noted. No erythema. No pallor.  Psychiatric: She has a normal mood and affect.    Labs reviewed:  Recent Labs  02/17/15 09/02/15  NA 138 139  K 3.5 4.2  BUN 15 19  CREATININE 0.7 0.7    Recent Labs  02/17/15 09/02/15  AST 13 22  ALT 12 16  ALKPHOS 94 113    Recent Labs  02/17/15 09/02/15  WBC 8.3 7.5  HGB 10.6* 11.5*  HCT 34* 38  PLT 221 270   Lab Results  Component Value Date  TSH 2.61 02/17/2015   Lab Results  Component Value Date   HGBA1C 9.3 09/14/2015   Lab Results  Component Value Date   CHOL 191 08/13/2014   HDL 30* 08/13/2014   LDLCALC 132 08/13/2014   TRIG 144 08/13/2014    Significant Diagnostic Results in last 30 days:  No results found.  Assessment/Plan 1. Hypertensive heart disease with heart failure (HCC) B/p within target goal range for DM. Continue spironolactone and Imdur. Ordering BMP.     2. Cerebrovascular accident (CVA), unspecified mechanism ([place]) Continue on ASA daily and Crestor. Continue to control high risk factors.   3.  CHF, stage C (HCC) No edema, shortness of breath or rapid weight gain. Continue on Lasix Spironolactone, Imdur.Monitor weight    4. Chronic obstructive pulmonary disease, unspecified COPD type ([place]) No increased sputum production or cough. continue on Spiriva and Albuterol PRN.   5. Hypothyroidism, unspecified hypothyroidism type Continue on Levothyroxine 25 mcg Tablet. Previous TSH level 1.689 (10/11/2015). Continue to monitor TSH level.  6. Neuropathic pain syndrome (non-herpetic) Continue to control CBG's. Continue on Gabapentin.   7. Major depression, chronic (HCC) No mood changes. Continue on Remeron and sertraline. Monitor for mood changes.  8. Type 2 DM  CBG's stable. Continue on Glimepiride, Metformin, Toujeo and Humalog. Ordering Urine Microalbumin. Monitor Hgb A1C    Family/ staff Communication: Reviewed plan of care with patient and facility Nurse.   Labs/tests ordered:  CBC, BMP, urine micro Albumin

## 2015-12-03 DIAGNOSIS — E1165 Type 2 diabetes mellitus with hyperglycemia: Secondary | ICD-10-CM | POA: Diagnosis not present

## 2015-12-03 LAB — MICROALBUMIN, URINE: MICROALB UR: 110.4

## 2015-12-06 DIAGNOSIS — M792 Neuralgia and neuritis, unspecified: Secondary | ICD-10-CM | POA: Diagnosis not present

## 2015-12-06 DIAGNOSIS — M545 Low back pain: Secondary | ICD-10-CM | POA: Diagnosis not present

## 2015-12-06 DIAGNOSIS — Z79899 Other long term (current) drug therapy: Secondary | ICD-10-CM | POA: Diagnosis not present

## 2015-12-06 DIAGNOSIS — M79673 Pain in unspecified foot: Secondary | ICD-10-CM | POA: Diagnosis not present

## 2015-12-06 DIAGNOSIS — G894 Chronic pain syndrome: Secondary | ICD-10-CM | POA: Diagnosis not present

## 2015-12-13 ENCOUNTER — Other Ambulatory Visit: Payer: Self-pay | Admitting: *Deleted

## 2015-12-13 DIAGNOSIS — E1165 Type 2 diabetes mellitus with hyperglycemia: Secondary | ICD-10-CM | POA: Diagnosis not present

## 2015-12-13 LAB — CBC AND DIFFERENTIAL
HCT: 35 % — AB (ref 36–46)
Hemoglobin: 10.4 g/dL — AB (ref 12.0–16.0)
Platelets: 246 10*3/uL (ref 150–399)
WBC: 10.7 10*3/mL

## 2015-12-13 LAB — HEMOGLOBIN A1C: Hemoglobin A1C: 8.8

## 2015-12-13 LAB — BASIC METABOLIC PANEL
BUN: 11 mg/dL (ref 4–21)
CREATININE: 0.7 mg/dL (ref 0.5–1.1)
Glucose: 238 mg/dL
POTASSIUM: 4.3 mmol/L (ref 3.4–5.3)
Sodium: 142 mmol/L (ref 137–147)

## 2015-12-13 MED ORDER — OXYCODONE-ACETAMINOPHEN 10-325 MG PO TABS: ORAL_TABLET | ORAL | Status: DC

## 2015-12-13 NOTE — Telephone Encounter (Signed)
[facility] 

## 2015-12-30 ENCOUNTER — Encounter: Payer: Self-pay | Admitting: Family

## 2015-12-30 ENCOUNTER — Non-Acute Institutional Stay (SKILLED_NURSING_FACILITY): Payer: Medicare Other | Admitting: Family

## 2015-12-30 DIAGNOSIS — I110 Hypertensive heart disease with heart failure: Secondary | ICD-10-CM

## 2015-12-30 DIAGNOSIS — E785 Hyperlipidemia, unspecified: Secondary | ICD-10-CM | POA: Diagnosis not present

## 2015-12-30 DIAGNOSIS — E039 Hypothyroidism, unspecified: Secondary | ICD-10-CM | POA: Diagnosis not present

## 2015-12-30 DIAGNOSIS — E1140 Type 2 diabetes mellitus with diabetic neuropathy, unspecified: Secondary | ICD-10-CM

## 2015-12-30 DIAGNOSIS — E1165 Type 2 diabetes mellitus with hyperglycemia: Secondary | ICD-10-CM | POA: Diagnosis not present

## 2015-12-30 DIAGNOSIS — J449 Chronic obstructive pulmonary disease, unspecified: Secondary | ICD-10-CM

## 2015-12-30 DIAGNOSIS — H9202 Otalgia, left ear: Secondary | ICD-10-CM | POA: Diagnosis not present

## 2015-12-30 DIAGNOSIS — M79671 Pain in right foot: Secondary | ICD-10-CM | POA: Diagnosis not present

## 2015-12-30 DIAGNOSIS — Z794 Long term (current) use of insulin: Secondary | ICD-10-CM

## 2015-12-30 DIAGNOSIS — IMO0002 Reserved for concepts with insufficient information to code with codable children: Secondary | ICD-10-CM

## 2015-12-30 DIAGNOSIS — I739 Peripheral vascular disease, unspecified: Secondary | ICD-10-CM | POA: Diagnosis not present

## 2015-12-30 DIAGNOSIS — B351 Tinea unguium: Secondary | ICD-10-CM | POA: Diagnosis not present

## 2015-12-30 NOTE — Progress Notes (Signed)
Location:  [place] Room Number: [place]:  SNF (31) Provider:  Marlowe Sax, FNP-C  Blanchie Serve, MD  Patient Care Team: Blanchie Serve, MD as PCP - General (Internal Medicine) Susan Fee, NP as Nurse Practitioner (Nurse Practitioner)  Extended Emergency Contact Information Primary Emergency Contact: Roblstow,Beth Address: [address]          [place], [place] [address] [name] Phone: [phone] Work Phone: [phone] Mobile Phone: [phone] Relation: Daughter Secondary Emergency Contact: Sattar,Scott Address: [address]          [place], [place] [address] Johnnette Litter of [name] Phone: [phone] Mobile Phone: [phone] Relation: Spouse  Code Status: DNR Goals of care: Advanced Directive information Advanced Directives 12/30/2015  Does patient have an advance directive? Yes  Type of Advance Directive Out of facility DNR (pink MOST or yellow form)  Does patient want to make changes to advanced directive? No - Patient declined  Copy of advanced directive(s) in chart? Yes     Chief Complaint  Patient presents with  . Medical Management of Chronic Issues    Routine Visit    HPI:  Pt is a 65 y.o. female seen today at [name] for medical management of chronic diseases. She has a medical history of HTN, Type 2 DM, Hypothyroidism, CAD, COPD among others. She is seen in her room today. She complains of chronic left ear pain with decreased hearing loss. She would like to see a specialist. She reports loose stool X 2 one day ago. She denies any fever, chills, cough, abdominal pain, nausea or vomiting.Her CBG's ranges in the 140's-200's.    Past Medical History  Diagnosis Date  . Hyperlipidemia   . Diabetes mellitus without complication ([place])   . COPD (chronic obstructive pulmonary disease) ([place])   . Depression   . Hypertension   . Thyroid disease     . Anemia   . CAD (coronary artery disease)   . Chronic pain   . Hemiplegia affecting non-dominant side, post-stroke ([place])   . Fall   . Urine retention   . Stroke [facility]) 12/2013    left side paralysis   Past Surgical History  Procedure Laterality Date  . Joint replacement      bil hip  . Abdominal hysterectomy    . Cholecystectomy    . Appendectomy    . Tonsillectomy      Allergies  Allergen Reactions  . Onion Shortness Of Breath and Swelling  . Pollen Extract Itching  . Morphine And Related Hives    PT DENIES ALLERGY  . Niaspan [Niacin Er] Other (See Comments)    unknown  . Other     onions  . Trazodone And Nefazodone Other (See Comments)    Hallucinations  . Valium [Diazepam] Other (See Comments)    unknown  . Wellbutrin [Bupropion] Other (See Comments)    unknown      Medication List       This list is accurate as of: 12/30/15  3:40 PM.  Always use your most recent med list.               ALBUTEROL SULFATE PO  Take by mouth. Every 8 as needed for wheezing     aspirin 81 MG chewable tablet  Chew 81 mg by mouth daily.     baclofen 10 MG tablet  Commonly known as:  LIORESAL  Take  10 mg by mouth 3 (three) times daily.     CAPSAICIN-MENTHOL EX  Apply  0.025% cream topically to feet twice daily for pain.     clonazePAM 0.5 MG tablet  Commonly known as:  KLONOPIN  Take 1 (0.5 mg tablet) by mouth every 6 hours as needed for anxiety     clotrimazole 1 % cream  Commonly known as:  LOTRIMIN  Apply 1 application topically as needed (For ringworm).     cyclobenzaprine 5 MG tablet  Commonly known as:  FLEXERIL  Take 5 mg by mouth 2 (two) times daily.     docusate sodium 100 MG capsule  Commonly known as:  COLACE  Take 2 capsules (200 mg total) by mouth 2 (two) times daily.     fluticasone 50 MCG/ACT nasal spray  Commonly known as:  FLONASE  Place 2 sprays into both nostrils daily.     furosemide 20 MG tablet  Commonly known as:  LASIX  Take 20 mg  by mouth daily.     gabapentin 400 MG capsule  Commonly known as:  NEURONTIN  Take 400 mg by mouth 3 (three) times daily.     glimepiride 2 MG tablet  Commonly known as:  AMARYL  Take 2 mg by mouth daily with breakfast.     hydrOXYzine 25 MG tablet  Commonly known as:  ATARAX/VISTARIL  Take 25 mg by mouth every 8 (eight) hours as needed for itching.     Insulin Glargine 300 UNIT/ML Sopn  Commonly known as:  TOUJEO SOLOSTAR  Inject 120 Units into the skin at bedtime.     insulin lispro 100 UNIT/ML injection  Commonly known as:  HUMALOG  Inject 36 Units into the skin 3 (three) times daily before meals. For CBG >/= 150 inject 10 additional units subcutaneously     isosorbide mononitrate 30 MG 24 hr tablet  Commonly known as:  IMDUR  Take 30 mg by mouth daily.     levothyroxine 25 MCG tablet  Commonly known as:  SYNTHROID, LEVOTHROID  Take 25 mcg by mouth daily before breakfast.     linaclotide 145 MCG Caps capsule  Commonly known as:  LINZESS  Take 145 mcg by mouth daily.     linagliptin 5 MG Tabs tablet  Commonly known as:  TRADJENTA  Take 5 mg by mouth daily.     MELATONIN PO  Take 6 mg by mouth at bedtime.     metFORMIN 1000 MG tablet  Commonly known as:  GLUCOPHAGE  Take 1,000 mg by mouth 2 (two) times daily with a meal.     mirtazapine 30 MG tablet  Commonly known as:  REMERON  Take 30 mg by mouth at bedtime.     nitroGLYCERIN 0.4 MG SL tablet  Commonly known as:  NITROSTAT  Place 0.4 mg under the tongue every 5 (five) minutes as needed for chest pain.     oxyCODONE-acetaminophen 10-325 MG tablet  Commonly known as:  PERCOCET  Take one tablet by mouth four times daily for chronic pain management. Do not exceed 4 gm of Tylenol in 24 hours     OXYGEN  Inhale 2 L into the lungs as needed.     promethazine 12.5 MG tablet  Commonly known as:  PHENERGAN  Take 12.5 mg by mouth every 6 (six) hours as needed for nausea or vomiting.     rosuvastatin 40 MG  tablet  Commonly known as:  CRESTOR  Take 40 mg by mouth  every evening. Use brand name for insurance     sertraline 100 MG tablet  Commonly known as:  ZOLOFT  Take 100 mg by mouth daily.     simethicone 80 MG chewable tablet  Commonly known as:  MYLICON  Chew 0000000 mg by mouth 3 (three) times daily.     spironolactone 25 MG tablet  Commonly known as:  ALDACTONE  Take 25 mg by mouth 2 (two) times daily.     SYSTANE ULTRA OP  Place 1 drop into both eyes at bedtime. And as needed     tiotropium 18 MCG inhalation capsule  Commonly known as:  SPIRIVA  Inhale 1 capsule (18 MCG CP Handihaler)  contents by taking two separate inhalations via handihaler device daily for COPD        Review of Systems  Constitutional: Negative for fever, chills, activity change, appetite change and fatigue.  HENT:       Left ear pain with decreased hearing.   Respiratory: Negative for cough, chest tightness, shortness of breath and wheezing.   Cardiovascular: Negative for chest pain, palpitations and leg swelling.  Gastrointestinal: Negative for nausea, vomiting, abdominal pain, constipation and abdominal distention.       Loose stool one day ago  Genitourinary:       Incontinent   Musculoskeletal: Positive for gait problem.  Skin: Negative.   Neurological: Negative for dizziness, seizures, syncope, light-headedness and headaches.  Psychiatric/Behavioral: Negative.     Immunization History  Administered Date(s) Administered  . Influenza-Unspecified 06/19/2014, 06/22/2015  . PPD Test 12/26/2013, 01/12/2014, 05/18/2015  . Pneumococcal-Unspecified 07/02/2014   Pertinent  Health Maintenance Due  Topic Date Due  . OPHTHALMOLOGY EXAM  09/11/2016 (Originally 09/01/2015)  . HEMOGLOBIN A1C  03/13/2016  . INFLUENZA VACCINE  04/11/2016  . FOOT EXAM  09/14/2016  . URINE MICROALBUMIN  12/02/2016  . MAMMOGRAM  12/01/2017  . PAP SMEAR  12/02/2018  . COLONOSCOPY  02/07/2024   No flowsheet data  found. Functional Status Survey:    Filed Vitals:   12/30/15 1449  BP: 166/88  Pulse: 84  Temp: 98.2 F (36.8 C)  Resp: 18  Height: 5\' 3"  (1.6 m)  Weight: 201 lb 3.2 oz (91.264 kg)  SpO2: 98%   Body mass index is 35.65 kg/(m^2). Physical Exam  Constitutional: She is oriented to person, place, and time. She appears well-developed and well-nourished. No distress.  HENT:  Head: Normocephalic.  Right Ear: External ear normal.  Left Ear: External ear normal.  Mouth/Throat: Oropharynx is clear and moist.  Eyes: Conjunctivae and EOM are normal. Pupils are equal, round, and reactive to light. Right eye exhibits no discharge. Left eye exhibits no discharge. No scleral icterus.  Neck: Normal range of motion. No JVD present. No thyromegaly present.  Cardiovascular: Normal rate, regular rhythm, normal heart sounds and intact distal pulses.  Exam reveals no gallop and no friction rub.   No murmur heard. Pulmonary/Chest: Effort normal and breath sounds normal. No respiratory distress. She has no wheezes. She has no rales.  Abdominal: She exhibits no mass. There is no rebound and no guarding.  Soft Non-distended, non-tender with hyperactive bowel sounds X 4   Musculoskeletal: She exhibits no edema or tenderness.  Bilateral foot drop   Lymphadenopathy:    She has no cervical adenopathy.  Neurological: She is oriented to person, place, and time.  Skin: Skin is warm and dry. No rash noted. No erythema. No pallor.  Psychiatric: She has a normal mood and affect.  Labs reviewed:  Recent Labs  02/17/15 09/02/15 12/13/15  NA 138 139 142  K 3.5 4.2 4.3  BUN 15 19 11   CREATININE 0.7 0.7 0.7    Recent Labs  02/17/15 09/02/15  AST 13 22  ALT 12 16  ALKPHOS 94 113    Recent Labs  02/17/15 09/02/15 12/13/15  WBC 8.3 7.5 10.7  HGB 10.6* 11.5* 10.4*  HCT 34* 38 35*  PLT 221 270 246   Lab Results  Component Value Date   TSH 1.69 10/11/2015   Lab Results  Component Value Date    HGBA1C 8.8 12/13/2015   Lab Results  Component Value Date   CHOL 191 08/13/2014   HDL 30* 08/13/2014   LDLCALC 132 08/13/2014   TRIG 144 08/13/2014    Significant Diagnostic Results in last 30 days:  No results found.  Assessment/Plan HTN Sys B/p in 160's this visit. Facility log b/p in 130's/70's -140's/60's. Asymptomatic. Continue Spironolactone, Imdur and lasix. Clonidine 0.1 mg tablet every 6 Hrs PRN for Systolic B/P > 0000000. Will add an ACEI or ARB if Sys B/p continues to be elevated.   COPD Afebrile. Exam findings negative.Continue on Albuterol and spiriva.   Type 2 DM CBG's ranges 140's-200's. Recent Hgb A1C 8.8 (12/13/2015). Continue Toujeo, Humalog , Tradjenta,Glimepiride and Metformin. Monitor Hgb A1C.   Hypothyroidism  Continue on Levothyroxine 25 mcg Tablet. Monitor TSH level 12/31/2015  Hyperlipidemia  Continue on Crestor 40 mg tablet. Lipid panel 12/31/2015  Ear pain  Chronic left ear pain request referral to ENT. Refer to ENT for evaluation decreased hearing.   Family/ staff Communication:Reviewed plan of care with patient and facility Nurse supervisor.   Labs/tests ordered:  TSH, Lipid panel 12/31/2015

## 2015-12-31 DIAGNOSIS — E784 Other hyperlipidemia: Secondary | ICD-10-CM | POA: Diagnosis not present

## 2015-12-31 LAB — TSH: TSH: 1.98 u[IU]/mL (ref 0.41–5.90)

## 2015-12-31 LAB — LIPID PANEL
CHOLESTEROL: 220 mg/dL — AB (ref 0–200)
HDL: 28 mg/dL — AB (ref 35–70)
LDL CALC: 142 mg/dL
Triglycerides: 249 mg/dL — AB (ref 40–160)

## 2016-01-03 ENCOUNTER — Non-Acute Institutional Stay (SKILLED_NURSING_FACILITY): Payer: Medicare Other | Admitting: Family

## 2016-01-03 ENCOUNTER — Encounter: Payer: Self-pay | Admitting: Family

## 2016-01-03 DIAGNOSIS — E785 Hyperlipidemia, unspecified: Secondary | ICD-10-CM | POA: Diagnosis not present

## 2016-01-03 DIAGNOSIS — M792 Neuralgia and neuritis, unspecified: Secondary | ICD-10-CM | POA: Diagnosis not present

## 2016-01-03 DIAGNOSIS — E1165 Type 2 diabetes mellitus with hyperglycemia: Secondary | ICD-10-CM

## 2016-01-03 DIAGNOSIS — G894 Chronic pain syndrome: Secondary | ICD-10-CM | POA: Diagnosis not present

## 2016-01-03 DIAGNOSIS — IMO0002 Reserved for concepts with insufficient information to code with codable children: Secondary | ICD-10-CM

## 2016-01-03 DIAGNOSIS — M545 Low back pain: Secondary | ICD-10-CM | POA: Diagnosis not present

## 2016-01-03 DIAGNOSIS — E1140 Type 2 diabetes mellitus with diabetic neuropathy, unspecified: Secondary | ICD-10-CM | POA: Diagnosis not present

## 2016-01-03 DIAGNOSIS — M79673 Pain in unspecified foot: Secondary | ICD-10-CM | POA: Diagnosis not present

## 2016-01-03 DIAGNOSIS — Z794 Long term (current) use of insulin: Secondary | ICD-10-CM | POA: Diagnosis not present

## 2016-01-03 NOTE — Progress Notes (Signed)
Location:  [place] Room Number: [place]:  SNF (31) Provider:  Marlowe Sax, FNP-C  Blanchie Serve, MD  Patient Care Team: Blanchie Serve, MD as PCP - General (Internal Medicine) Gerlene Fee, NP as Nurse Practitioner (Nurse Practitioner)  Extended Emergency Contact Information Primary Emergency Contact: Roblstow,Beth Address: [address]          [place], [place] [address] Johnnette Litter of [place] Phone: [phone] Work Phone: [phone] Mobile Phone: [phone] Relation: Daughter Secondary Emergency Contact: Sheu,Scott Address: [address]          [place], [place] [address] Johnnette Litter of [place] Phone: [phone] Mobile Phone: [phone] Relation: Spouse  Code Status: DNR Goals of care: Advanced Directive information Advanced Directives 01/03/2016  Does patient have an advance directive? Yes  Type of Advance Directive Out of facility DNR (pink MOST or yellow form)  Does patient want to make changes to advanced directive? -  Copy of advanced directive(s) in chart? Yes     Chief Complaint  Patient presents with  . Acute Visit    Acute Concerns    HPI:  Pt is a 65 y.o. female seen today at [facility] for an acute visit for evaluation of abnormal lab results and high blood sugars. She is seen in her room today. She states no acute issues. Her recent lipid panel results showed chol 220, LDL 142 and  TRG 249 . Her facility CBG's log AM ranges in the 140's-300's in the morning and 170's-300's in the evening. No signs or symptoms of hyperglycemia reported.   Past Medical History  Diagnosis Date  . Hyperlipidemia   . Diabetes mellitus without complication ([place])   . COPD (chronic obstructive pulmonary disease) ([place])   . Depression   . Hypertension   . Thyroid disease   . Anemia   . CAD (coronary artery disease)   . Chronic pain   . Hemiplegia affecting non-dominant  side, post-stroke ([place])   . Fall   . Urine retention   . Stroke [facility]) 12/2013    left side paralysis   Past Surgical History  Procedure Laterality Date  . Joint replacement      bil hip  . Abdominal hysterectomy    . Cholecystectomy    . Appendectomy    . Tonsillectomy      Allergies  Allergen Reactions  . Onion Shortness Of Breath and Swelling  . Pollen Extract Itching  . Morphine And Related Hives    PT DENIES ALLERGY  . Niaspan [Niacin Er] Other (See Comments)    unknown  . Other     onions  . Trazodone And Nefazodone Other (See Comments)    Hallucinations  . Valium [Diazepam] Other (See Comments)    unknown  . Wellbutrin [Bupropion] Other (See Comments)    unknown      Medication List       This list is accurate as of: 01/03/16  1:54 PM.  Always use your most recent med list.               ALBUTEROL SULFATE PO  Take by mouth. Every 8 as needed for wheezing and shortness of breath     aspirin 81 MG chewable tablet  Chew 81 mg by mouth daily.     baclofen 10 MG tablet  Commonly known as:  LIORESAL  Take 10 mg by mouth 3 (three) times daily.  CAPSAICIN-MENTHOL EX  Apply  0.025% cream topically to feet twice daily for pain.     clonazePAM 0.5 MG tablet  Commonly known as:  KLONOPIN  Take 1 (0.5 mg tablet) by mouth every 6 hours as needed for anxiety     cloNIDine 0.1 MG tablet  Commonly known as:  CATAPRES  0.1 mg. 0.1 mg tablet every 6 hours as needed for sys B/P > 160     clotrimazole 1 % cream  Commonly known as:  LOTRIMIN  Apply 1 application topically as needed (For ringworm).     cyclobenzaprine 5 MG tablet  Commonly known as:  FLEXERIL  Take 5 mg by mouth 2 (two) times daily.     docusate sodium 100 MG capsule  Commonly known as:  COLACE  Take 2 capsules (200 mg total) by mouth 2 (two) times daily.     fluticasone 50 MCG/ACT nasal spray  Commonly known as:  FLONASE  Place 2 sprays into both nostrils daily.     furosemide 20 MG  tablet  Commonly known as:  LASIX  Take 20 mg by mouth daily.     gabapentin 400 MG capsule  Commonly known as:  NEURONTIN  Take 400 mg by mouth 3 (three) times daily.     glimepiride 2 MG tablet  Commonly known as:  AMARYL  Take 2 mg by mouth daily with breakfast.     hydrocortisone cream 1 %  1 application. Apply externally topically to the vvaginal area bid as needed for yeast infection/pruritis     hydrOXYzine 25 MG tablet  Commonly known as:  ATARAX/VISTARIL  Take 25 mg by mouth every 8 (eight) hours as needed for itching.     Insulin Glargine 300 UNIT/ML Sopn  Commonly known as:  TOUJEO SOLOSTAR  Inject 120 Units into the skin at bedtime.     insulin lispro 100 UNIT/ML injection  Commonly known as:  HUMALOG  Inject 36 Units into the skin 3 (three) times daily before meals. For CBG >/= 150 inject 10 additional units subcutaneously     isosorbide mononitrate 30 MG 24 hr tablet  Commonly known as:  IMDUR  Take 30 mg by mouth daily.     levothyroxine 25 MCG tablet  Commonly known as:  SYNTHROID, LEVOTHROID  Take 25 mcg by mouth daily before breakfast.     linaclotide 145 MCG Caps capsule  Commonly known as:  LINZESS  Take 145 mcg by mouth daily.     linagliptin 5 MG Tabs tablet  Commonly known as:  TRADJENTA  Take 5 mg by mouth daily.     MELATONIN PO  Take 6 mg by mouth at bedtime.     metFORMIN 1000 MG tablet  Commonly known as:  GLUCOPHAGE  Take 1,000 mg by mouth 2 (two) times daily with a meal.     mirtazapine 30 MG tablet  Commonly known as:  REMERON  Take 30 mg by mouth at bedtime.     nitroGLYCERIN 0.4 MG SL tablet  Commonly known as:  NITROSTAT  Place 0.4 mg under the tongue every 5 (five) minutes as needed for chest pain. Notify MD if not relieved by NTG     oxyCODONE-acetaminophen 10-325 MG tablet  Commonly known as:  PERCOCET  Take one tablet by mouth four times daily for chronic pain management. Do not exceed 4 gm of Tylenol in 24 hours      OXYGEN  Inhale 2 L into the lungs as needed.  promethazine 12.5 MG tablet  Commonly known as:  PHENERGAN  Take 12.5 mg by mouth every 6 (six) hours as needed for nausea or vomiting.     rosuvastatin 40 MG tablet  Commonly known as:  CRESTOR  Take 40 mg by mouth every evening. Use brand name for insurance     sertraline 100 MG tablet  Commonly known as:  ZOLOFT  Take 100 mg by mouth daily.     simethicone 80 MG chewable tablet  Commonly known as:  MYLICON  Chew 0000000 mg by mouth 3 (three) times daily.     spironolactone 25 MG tablet  Commonly known as:  ALDACTONE  Take 25 mg by mouth 2 (two) times daily.     SYSTANE ULTRA OP  Place 1 drop into both eyes at bedtime. And as needed     tiotropium 18 MCG inhalation capsule  Commonly known as:  SPIRIVA  Inhale 1 capsule (18 MCG CP Handihaler)  contents by taking two separate inhalations via handihaler device daily for COPD     zinc oxide 11.3 % Crea cream  Commonly known as:  BALMEX  Apply 1 application topically 2 (two) times daily. Apply cream topically to buttocks and peri-anal as needed for redness        Review of Systems  Constitutional: Negative for fever, chills, activity change, appetite change and fatigue.  Respiratory: Negative for cough, chest tightness, shortness of breath and wheezing.   Cardiovascular: Negative for chest pain, palpitations and leg swelling.  Gastrointestinal: Negative for nausea, vomiting, abdominal pain, constipation and abdominal distention.  Endocrine: Negative for polydipsia, polyphagia and polyuria.  Genitourinary:       Incontinent   Musculoskeletal: Positive for gait problem.  Skin: Negative.   Neurological: Negative for dizziness, seizures, syncope, light-headedness and headaches.  Psychiatric/Behavioral: Negative.     Immunization History  Administered Date(s) Administered  . Influenza-Unspecified 06/19/2014, 06/22/2015  . PPD Test 12/26/2013, 01/12/2014, 05/18/2015  .  Pneumococcal-Unspecified 07/02/2014   Pertinent  Health Maintenance Due  Topic Date Due  . OPHTHALMOLOGY EXAM  09/11/2016 (Originally 09/01/2015)  . INFLUENZA VACCINE  04/11/2016  . HEMOGLOBIN A1C  06/13/2016  . FOOT EXAM  09/14/2016  . URINE MICROALBUMIN  12/02/2016  . MAMMOGRAM  12/01/2017  . PAP SMEAR  12/02/2018  . COLONOSCOPY  02/07/2024   No flowsheet data found. Functional Status Survey:    Filed Vitals:   01/03/16 1344  Height: 5\' 3"  (1.6 m)  Weight: 201 lb 3.2 oz (91.264 kg)   Body mass index is 35.65 kg/(m^2). Physical Exam  Constitutional: She is oriented to person, place, and time. She appears well-developed and well-nourished. No distress.  HENT:  Head: Normocephalic.  Right Ear: External ear normal.  Left Ear: External ear normal.  Mouth/Throat: Oropharynx is clear and moist.  Eyes: Conjunctivae and EOM are normal. Pupils are equal, round, and reactive to light. Right eye exhibits no discharge. Left eye exhibits no discharge. No scleral icterus.  Neck: Normal range of motion. No JVD present. No thyromegaly present.  Cardiovascular: Normal rate, regular rhythm, normal heart sounds and intact distal pulses.  Exam reveals no gallop and no friction rub.   No murmur heard. Pulmonary/Chest: Effort normal and breath sounds normal. No respiratory distress. She has no wheezes. She has no rales.  Abdominal: Soft. Bowel sounds are normal. She exhibits no distension. There is no tenderness. There is no rebound and no guarding.  Musculoskeletal: She exhibits no edema or tenderness.  Bilateral foot drop  Lymphadenopathy:    She has no cervical adenopathy.  Neurological: She is oriented to person, place, and time.  Skin: Skin is warm and dry. No rash noted. No erythema. No pallor.  Psychiatric: She has a normal mood and affect.    Labs reviewed:  Recent Labs  02/17/15 09/02/15 12/13/15  NA 138 139 142  K 3.5 4.2 4.3  BUN 15 19 11   CREATININE 0.7 0.7 0.7     Recent Labs  02/17/15 09/02/15  AST 13 22  ALT 12 16  ALKPHOS 94 113    Recent Labs  02/17/15 09/02/15 12/13/15  WBC 8.3 7.5 10.7  HGB 10.6* 11.5* 10.4*  HCT 34* 38 35*  PLT 221 270 246   Lab Results  Component Value Date   TSH 1.69 10/11/2015   Lab Results  Component Value Date   HGBA1C 8.8 12/13/2015   Lab Results  Component Value Date   CHOL 191 08/13/2014   HDL 30* 08/13/2014   LDLCALC 132 08/13/2014   TRIG 144 08/13/2014    Significant Diagnostic Results in last 30 days:  No results found.  Assessment/Plan Type 2 DM  CBG's in the 140's -300's. Continue Tradjenta 5 mg Tablet, Humalog SSI, Glimepiride 2 mg Tablet, Metformin 1000 mg Tablet and increase Toujeo to 122 units SQ at bedtime.Encouraged to adhere to CCD. Monitor Hgb A1C    Hyperlipidemia  chol 220, LDL 142 and  TRG 249 continue crestor 40 mg tablet. Discussed carbohydrates intake and snacks reduction with patient to . control CBG's.continue to monitor.     Family/ staff Communication: Reviewed plan with patient and facility Nurse supervisor.  Labs/tests ordered: None

## 2016-01-04 DIAGNOSIS — Z79899 Other long term (current) drug therapy: Secondary | ICD-10-CM | POA: Diagnosis not present

## 2016-01-04 LAB — LIPID PANEL
CHOLESTEROL: 209 mg/dL — AB (ref 0–200)
HDL: 24 mg/dL — AB (ref 35–70)
LDL Cholesterol: 134 mg/dL
Triglycerides: 258 mg/dL — AB (ref 40–160)

## 2016-01-13 ENCOUNTER — Other Ambulatory Visit: Payer: Self-pay | Admitting: *Deleted

## 2016-01-13 MED ORDER — OXYCODONE-ACETAMINOPHEN 10-325 MG PO TABS: ORAL_TABLET | ORAL | Status: DC

## 2016-01-13 NOTE — Telephone Encounter (Signed)
[facility] 

## 2016-01-14 ENCOUNTER — Other Ambulatory Visit: Payer: Self-pay | Admitting: *Deleted

## 2016-01-14 MED ORDER — OXYCODONE-ACETAMINOPHEN 10-325 MG PO TABS: ORAL_TABLET | ORAL | Status: DC

## 2016-01-14 NOTE — Telephone Encounter (Signed)
[facility] 

## 2016-01-25 ENCOUNTER — Non-Acute Institutional Stay (SKILLED_NURSING_FACILITY): Payer: Medicare Other | Admitting: Family

## 2016-01-25 DIAGNOSIS — Z794 Long term (current) use of insulin: Secondary | ICD-10-CM | POA: Diagnosis not present

## 2016-01-25 DIAGNOSIS — E1165 Type 2 diabetes mellitus with hyperglycemia: Secondary | ICD-10-CM | POA: Diagnosis not present

## 2016-01-25 DIAGNOSIS — I110 Hypertensive heart disease with heart failure: Secondary | ICD-10-CM

## 2016-01-25 DIAGNOSIS — J449 Chronic obstructive pulmonary disease, unspecified: Secondary | ICD-10-CM | POA: Diagnosis not present

## 2016-01-25 DIAGNOSIS — E039 Hypothyroidism, unspecified: Secondary | ICD-10-CM

## 2016-01-25 DIAGNOSIS — I509 Heart failure, unspecified: Secondary | ICD-10-CM

## 2016-01-25 DIAGNOSIS — I2510 Atherosclerotic heart disease of native coronary artery without angina pectoris: Secondary | ICD-10-CM | POA: Diagnosis not present

## 2016-01-25 DIAGNOSIS — K5901 Slow transit constipation: Secondary | ICD-10-CM | POA: Diagnosis not present

## 2016-01-25 DIAGNOSIS — E1140 Type 2 diabetes mellitus with diabetic neuropathy, unspecified: Secondary | ICD-10-CM

## 2016-01-25 DIAGNOSIS — IMO0002 Reserved for concepts with insufficient information to code with codable children: Secondary | ICD-10-CM

## 2016-01-25 NOTE — Progress Notes (Addendum)
Patient ID: Susan [name], female   DOB: [birth year], 65 y.o.   MRN: [id number]  Location:  [place] Room Number: [place] of Service:  SNF (31) Provider:  Solyana Nonaka FNP-C    Blanchie Serve, MD  Patient Care Team: Blanchie Serve, MD as PCP - General (Internal Medicine) Sandrea Hughs, NP as Nurse Practitioner (Family Medicine)  Extended Emergency Contact Information Primary Emergency Contact: Roblstow,Beth Address: [address]          [place], [place] [address] Johnnette Litter of [place] Phone: [phone] Work Phone: [phone] Mobile Phone: [phone] Relation: Daughter Secondary Emergency Contact: Pence,Scott Address: [address]          [place], [place] [address] Johnnette Litter of [place] Phone: [phone] Mobile Phone: [phone] Relation: Spouse  Code Status:  DNR Goals of care: Advanced Directive information Advanced Directives 01/03/2016  Does patient have an advance directive? Yes  Type of Advance Directive Out of facility DNR (pink MOST or yellow form)  Does patient want to make changes to advanced directive? -  Copy of advanced directive(s) in chart? Yes     Chief Complaint  Patient presents with  . Medical Management of Chronic Issues    HPI:  Pt is a 65 y.o. female seen today at [facility] for medical management of chronic diseases.She has a medical history of HTN, CAD, COPD, Type 2 DM, Hypothyroidism among other conditions. She is seen in her room today. She continues to require total care assistance with her ADL's. She has had no recent hospital visit, no recent fall episodes or skin breakdown. She has lost six pounds weight over the past one month currently following up with facility Registered Dietician. She continues with facility restorative exercises. Her CBG's ranges in 140's-200's in AM and 130's-200's in the evenings.      Past Medical History:  Diagnosis Date  .  Anemia   . CAD (coronary artery disease)   . Chronic pain   . COPD (chronic obstructive pulmonary disease) ([place])   . Depression   . Diabetes mellitus without complication ([place])   . Fall   . Hemiplegia affecting non-dominant side, post-stroke ([place])   . Hyperlipidemia   . Hypertension   . Stroke ([place]) 12/2013   left side paralysis  . Thyroid disease   . Urine retention    Past Surgical History:  Procedure Laterality Date  . ABDOMINAL HYSTERECTOMY    . APPENDECTOMY    . CHOLECYSTECTOMY    . JOINT REPLACEMENT     bil hip  . TONSILLECTOMY      Allergies  Allergen Reactions  . Onion Shortness Of Breath and Swelling  . Pollen Extract Itching  . Morphine And Related Hives    PT DENIES ALLERGY  . Niaspan [Niacin Er] Other (See Comments)    unknown  . Other     onions  . Trazodone And Nefazodone Other (See Comments)    Hallucinations  . Valium [Diazepam] Other (See Comments)    unknown  . Wellbutrin [Bupropion] Other (See Comments)    unknown      Medication List       Accurate as of 01/25/16 11:59 PM. Always use your most recent med list.          ALBUTEROL SULFATE PO Take by mouth. Every 8 as needed for wheezing and shortness of breath   aspirin 81 MG chewable tablet Chew 81 mg by  mouth daily.   baclofen 10 MG tablet Commonly known as:  LIORESAL Take 10 mg by mouth 3 (three) times daily. For muscle spasms   CAPSAICIN-MENTHOL EX Apply  0.025% cream topically to feet twice daily for pain.   clonazePAM 0.5 MG tablet Commonly known as:  KLONOPIN Take 1 (0.5 mg tablet) by mouth every 6 hours as needed for anxiety   cloNIDine 0.1 MG tablet Commonly known as:  CATAPRES 0.1 mg. 0.1 mg tablet every 6 hours as needed for sys B/P > 160   clotrimazole 1 % cream Commonly known as:  LOTRIMIN Apply 1 application topically as needed (For ringworm).   cyclobenzaprine 5 MG tablet Commonly known as:  FLEXERIL Take 5 mg by mouth 2 (two) times daily.   docusate  sodium 100 MG capsule Commonly known as:  COLACE Take 2 capsules (200 mg total) by mouth 2 (two) times daily.   fluticasone 50 MCG/ACT nasal spray Commonly known as:  FLONASE Place 2 sprays into both nostrils daily. For chronic sinus infections   furosemide 20 MG tablet Commonly known as:  LASIX Take 20 mg by mouth daily.   gabapentin 400 MG capsule Commonly known as:  NEURONTIN Take 1 (400 mg) capsule by mouth three times a day with meals. Also take 3 (1200mg ) capsules by mouth daily at bedtime.   glimepiride 2 MG tablet Commonly known as:  AMARYL Take 2 mg by mouth daily with breakfast.   hydrocortisone cream 1 % 1 application. Apply externally topically to the vvaginal area bid as needed for yeast infection/pruritis   hydrOXYzine 25 MG tablet Commonly known as:  ATARAX/VISTARIL Take 25 mg by mouth every 8 (eight) hours as needed for itching.   Insulin Glargine 300 UNIT/ML Sopn Commonly known as:  TOUJEO SOLOSTAR Inject 120 Units into the skin at bedtime.   insulin lispro 100 UNIT/ML injection Commonly known as:  HUMALOG Inject 36 Units into the skin 3 (three) times daily before meals. For CBG >/= 150 inject 10 additional units subcutaneously   isosorbide mononitrate 30 MG 24 hr tablet Commonly known as:  IMDUR Take 30 mg by mouth daily.   levothyroxine 25 MCG tablet Commonly known as:  SYNTHROID, LEVOTHROID Take 25 mcg by mouth daily before breakfast.   linaclotide 145 MCG Caps capsule Commonly known as:  LINZESS Take 145 mcg by mouth daily. Give 30 minutes prior to first meal and taken on an empty stomach.   linagliptin 5 MG Tabs tablet Commonly known as:  TRADJENTA Take 5 mg by mouth daily.   MELATONIN PO Take 6 mg by mouth at bedtime.   metFORMIN 1000 MG tablet Commonly known as:  GLUCOPHAGE Take 1,000 mg by mouth 2 (two) times daily with a meal.   mirtazapine 30 MG tablet Commonly known as:  REMERON Take 30 mg by mouth at bedtime.   nitroGLYCERIN  0.4 MG SL tablet Commonly known as:  NITROSTAT Place 0.4 mg under the tongue every 5 (five) minutes as needed for chest pain. Notify MD if not relieved by NTG   oxyCODONE-acetaminophen 10-325 MG tablet Commonly known as:  PERCOCET Take one tablet by mouth four times daily for chronic pain management. Do not exceed 4 gm of Tylenol in 24 hours   OXYGEN Inhale 2 L into the lungs as needed.   promethazine 12.5 MG tablet Commonly known as:  PHENERGAN Take 12.5 mg by mouth every 6 (six) hours as needed for nausea or vomiting.   rosuvastatin 40 MG tablet Commonly  known as:  CRESTOR Take 40 mg by mouth at bedtime. Use brand name for insurance   sertraline 100 MG tablet Commonly known as:  ZOLOFT Take 100 mg by mouth daily. For depression   simethicone 80 MG chewable tablet Commonly known as:  MYLICON Chew 0000000 mg by mouth 3 (three) times daily.   spironolactone 25 MG tablet Commonly known as:  ALDACTONE Take 25 mg by mouth 2 (two) times daily. For edema   SYSTANE ULTRA OP Place 1 drop into both eyes at bedtime as needed. Wait 3 to 5 minutes between 2 eye meds   tiotropium 18 MCG inhalation capsule Commonly known as:  SPIRIVA Inhale 1 capsule (18 MCG CP Handihaler)  contents by taking two separate inhalations via handihaler device daily for COPD   zinc oxide 11.3 % Crea cream Commonly known as:  BALMEX Apply cream topically to buttocks and peri-anal as needed for redness       Review of Systems  Constitutional: Negative for activity change, appetite change, chills, fatigue and fever.  HENT: Negative for congestion, rhinorrhea, sinus pressure, sneezing and sore throat.   Eyes: Negative.   Respiratory: Negative for cough, chest tightness, shortness of breath and wheezing.   Cardiovascular: Negative for chest pain, palpitations and leg swelling.  Gastrointestinal: Negative for abdominal distention, abdominal pain, constipation, nausea and vomiting.  Endocrine: Negative for  polydipsia, polyphagia and polyuria.  Genitourinary: Negative for flank pain and urgency.       Incontinent for B/B   Musculoskeletal: Positive for gait problem.  Skin: Negative.   Neurological: Negative for dizziness, seizures, syncope, light-headedness and headaches.  Psychiatric/Behavioral: Negative.     Immunization History  Administered Date(s) Administered  . Influenza-Unspecified 06/19/2014, 06/22/2015  . PPD Test 12/26/2013, 01/12/2014, 05/18/2015  . Pneumococcal-Unspecified 07/02/2014   Pertinent  Health Maintenance Due  Topic Date Due  . OPHTHALMOLOGY EXAM  09/11/2016 (Originally 09/01/2015)  . INFLUENZA VACCINE  09/11/2017 (Originally 04/11/2016)  . PNA vac Low Risk Adult (1 of 2 - PCV13) 09/11/2017 (Originally 01/09/2016)  . HEMOGLOBIN A1C  09/13/2016  . FOOT EXAM  09/14/2016  . URINE MICROALBUMIN  12/02/2016  . MAMMOGRAM  12/01/2017  . PAP SMEAR  12/02/2018  . COLONOSCOPY  02/07/2024  . DEXA SCAN  Completed      Vitals:   01/25/16 1200  BP: 118/88  Pulse: 84  Resp: 18  Temp: 97.9 F (36.6 C)  SpO2: 98%  Weight: 194 lb 9.6 oz (88.3 kg)  Height: 5\' 3"  (1.6 m)   Body mass index is 34.47 kg/m. Physical Exam  Constitutional: She is oriented to person, place, and time. She appears well-developed and well-nourished. No distress.  HENT:  Head: Normocephalic.  Mouth/Throat: Oropharynx is clear and moist.  Eyes: Conjunctivae and EOM are normal. Pupils are equal, round, and reactive to light. Right eye exhibits no discharge. Left eye exhibits no discharge. No scleral icterus.  Neck: Normal range of motion. No JVD present. No thyromegaly present.  Cardiovascular: Normal rate, regular rhythm, normal heart sounds and intact distal pulses.  Exam reveals no gallop and no friction rub.   No murmur heard. Pulmonary/Chest: Effort normal and breath sounds normal. No respiratory distress. She has no wheezes. She has no rales.  Abdominal: Soft. Bowel sounds are normal. She  exhibits no distension. There is no tenderness. There is no rebound and no guarding.  Genitourinary:  Genitourinary Comments: Incontinent for both bowel and bladder wears adult size depends.   Musculoskeletal: She exhibits no edema  or tenderness.  Left side paralysis   Lymphadenopathy:    She has no cervical adenopathy.  Neurological: She is oriented to person, place, and time.  Skin: Skin is warm and dry. No rash noted. She is not diaphoretic. No erythema. No pallor.  Psychiatric: She has a normal mood and affect.    Labs reviewed:  Recent Labs  09/02/15 12/13/15  NA 139 142  K 4.2 4.3  BUN 19 11  CREATININE 0.7 0.7    Recent Labs  09/02/15  AST 22  ALT 16  ALKPHOS 113    Recent Labs  09/02/15 12/13/15  WBC 7.5 10.7  HGB 11.5* 10.4*  HCT 38 35*  PLT 270 246   Lab Results  Component Value Date   TSH 1.98 12/31/2015   Lab Results  Component Value Date   HGBA1C 9.3 03/13/2016   Lab Results  Component Value Date   CHOL 209 (A) 01/04/2016   HDL 24 (A) 01/04/2016   LDLCALC 134 01/04/2016   TRIG 258 (A) 01/04/2016   Assessment/Plan 1. Hypertensive heart disease with heart failure (HCC) B/p stable. Continue on imdur. Monitor BMP   2. CHF, stage C (HCC) Stable. No weight gain. Continue on Furosemide and imdur.Continue to monitor weight.   3. Coronary artery disease involving native coronary artery of native heart without angina pectoris Asymptomatic. Continue on ASA 81 mg Tablet.   4. Chronic obstructive pulmonary disease, unspecified COPD type ([place]) Afebrile. Exam findings negative for wheezing or shortness of breath.Continue Albuterol and Spiriva.    5. Slow transit constipation Current regimen effective.   6. Uncontrolled type 2 diabetes mellitus with diabetic neuropathy, with long-term current use of insulin ([place]) Continue on metformin,Humalog per, Tradjenta,and Tujeo. Monitor Hgb A1C  7. Hypothyroidism, unspecified hypothyroidism type  Continue on  Levothyroxine. Monitor TSH level.      Family/ staff Communication: Reviewed plan of care with patient and facility Nurse supervisor.   Labs/tests ordered:  None

## 2016-01-28 DIAGNOSIS — F411 Generalized anxiety disorder: Secondary | ICD-10-CM | POA: Diagnosis not present

## 2016-01-28 DIAGNOSIS — G4700 Insomnia, unspecified: Secondary | ICD-10-CM | POA: Diagnosis not present

## 2016-01-28 DIAGNOSIS — F329 Major depressive disorder, single episode, unspecified: Secondary | ICD-10-CM | POA: Diagnosis not present

## 2016-02-21 ENCOUNTER — Non-Acute Institutional Stay (SKILLED_NURSING_FACILITY): Payer: Medicare Other | Admitting: Family

## 2016-02-21 DIAGNOSIS — E1165 Type 2 diabetes mellitus with hyperglycemia: Secondary | ICD-10-CM | POA: Diagnosis not present

## 2016-02-21 DIAGNOSIS — E1140 Type 2 diabetes mellitus with diabetic neuropathy, unspecified: Secondary | ICD-10-CM

## 2016-02-21 DIAGNOSIS — IMO0002 Reserved for concepts with insufficient information to code with codable children: Secondary | ICD-10-CM

## 2016-02-21 DIAGNOSIS — M792 Neuralgia and neuritis, unspecified: Secondary | ICD-10-CM

## 2016-02-21 DIAGNOSIS — E785 Hyperlipidemia, unspecified: Secondary | ICD-10-CM | POA: Diagnosis not present

## 2016-02-21 DIAGNOSIS — E039 Hypothyroidism, unspecified: Secondary | ICD-10-CM | POA: Diagnosis not present

## 2016-02-21 DIAGNOSIS — I110 Hypertensive heart disease with heart failure: Secondary | ICD-10-CM

## 2016-02-21 DIAGNOSIS — J449 Chronic obstructive pulmonary disease, unspecified: Secondary | ICD-10-CM | POA: Diagnosis not present

## 2016-02-21 DIAGNOSIS — Z794 Long term (current) use of insulin: Secondary | ICD-10-CM

## 2016-02-21 NOTE — Progress Notes (Signed)
Patient ID: Susan [name], female   DOB: [birth year], 65 y.o.   MRN: [id number]  Location:  [place]:  SNF (31) Provider:  Juandavid Dallman FNP-C   Blanchie Serve, MD  Patient Care Team: Blanchie Serve, MD as PCP - General (Internal Medicine) Gerlene Fee, NP as Nurse Practitioner (Nurse Practitioner)  Extended Emergency Contact Information Primary Emergency Contact: Roblstow,Beth Address: [address]          [place], [place] [address] Johnnette Litter of [place] Phone: [phone] Work Phone: [phone] Mobile Phone: [phone] Relation: Daughter Secondary Emergency Contact: Kabir,Scott Address: [address]          [place], [place] [address] Johnnette Litter of [place] Phone: [phone] Mobile Phone: [phone] Relation: Spouse  Code Status: DNR  Goals of care: Advanced Directive information Advanced Directives 01/03/2016  Does patient have an advance directive? Yes  Type of Advance Directive Out of facility DNR (pink MOST or yellow form)  Does patient want to make changes to advanced directive? -  Copy of advanced directive(s) in chart? Yes     Chief Complaint  Patient presents with  . Medical Management of Chronic Issues    HPI:  Pt is a 65 y.o. female seen today at [facility] for medical management of chronic diseases.she has a medical history of HTN,COPD, Neuropathy, Hemiparesis, Hypothyroidism, Hyperlipidemia among other condition.she is seen in her room today. She continues to complain of left foot pain requesting follow up appointment with her foot doctor at [facility]. She is currently on Capsaicin cream and Gabapentin.She also complains of right neck fold and right groin rash. CBG's ranging in 140's -200's in the morning and afternoon. Facility staff reports no new concerns.    Past Medical History  Diagnosis Date  . Hyperlipidemia   . Diabetes mellitus without complication  ([place])   . COPD (chronic obstructive pulmonary disease) ([place])   . Depression   . Hypertension   . Thyroid disease   . Anemia   . CAD (coronary artery disease)   . Chronic pain   . Hemiplegia affecting non-dominant side, post-stroke ([place])   . Fall   . Urine retention   . Stroke [facility]) 12/2013    left side paralysis   Past Surgical History  Procedure Laterality Date  . Joint replacement      bil hip  . Abdominal hysterectomy    . Cholecystectomy    . Appendectomy    . Tonsillectomy      Allergies  Allergen Reactions  . Onion Shortness Of Breath and Swelling  . Pollen Extract Itching  . Morphine And Related Hives    PT DENIES ALLERGY  . Niaspan [Niacin Er] Other (See Comments)    unknown  . Other     onions  . Trazodone And Nefazodone Other (See Comments)    Hallucinations  . Valium [Diazepam] Other (See Comments)    unknown  . Wellbutrin [Bupropion] Other (See Comments)    unknown      Medication List       This list is accurate as of: 02/21/16  6:21 PM.  Always use your most recent med list.               aspirin 81 MG chewable tablet  Chew 81 mg by mouth daily.     baclofen 10 MG tablet  Commonly known as:  LIORESAL  Take 10 mg by mouth  3 (three) times daily.     CAPSAICIN-MENTHOL EX  Apply  0.025% cream topically to feet twice daily for pain.     clonazePAM 0.5 MG tablet  Commonly known as:  KLONOPIN  Take 1 (0.5 mg tablet) by mouth every 6 hours as needed for anxiety     cyclobenzaprine 5 MG tablet  Commonly known as:  FLEXERIL  Take 5 mg by mouth 2 (two) times daily.     docusate sodium 100 MG capsule  Commonly known as:  COLACE  Take 2 capsules (200 mg total) by mouth 2 (two) times daily.     fluticasone 50 MCG/ACT nasal spray  Commonly known as:  FLONASE  Place 2 sprays into both nostrils daily.     furosemide 20 MG tablet  Commonly known as:  LASIX  Take 20 mg by mouth daily.     gabapentin 400 MG capsule  Commonly known as:   NEURONTIN  Take 1 (400 mg) capsule by mouth three times a day with meals. Also take 3 (1200mg ) capsules by mouth daily at bedtime.     glimepiride 2 MG tablet  Commonly known as:  AMARYL  Take 2 mg by mouth daily with breakfast.     hydrOXYzine 25 MG tablet  Commonly known as:  ATARAX/VISTARIL  Take 25 mg by mouth every 8 (eight) hours as needed for itching.     insulin lispro 100 UNIT/ML injection  Commonly known as:  HUMALOG  Inject 36 Units into the skin 3 (three) times daily before meals. For CBG >/= 150 inject 10 additional units subcutaneously     isosorbide mononitrate 30 MG 24 hr tablet  Commonly known as:  IMDUR  Take 30 mg by mouth daily.     levothyroxine 25 MCG tablet  Commonly known as:  SYNTHROID, LEVOTHROID  Take 25 mcg by mouth daily before breakfast.     linaclotide 145 MCG Caps capsule  Commonly known as:  LINZESS  Take 145 mcg by mouth daily. Give 30 minutes prior to first meal and taken on an empty stomach.     linagliptin 5 MG Tabs tablet  Commonly known as:  TRADJENTA  Take 5 mg by mouth daily.     MELATONIN PO  Take 6 mg by mouth at bedtime.     metFORMIN 1000 MG tablet  Commonly known as:  GLUCOPHAGE  Take 1,000 mg by mouth 2 (two) times daily with a meal.     mirtazapine 30 MG tablet  Commonly known as:  REMERON  Take 30 mg by mouth at bedtime.     nitroGLYCERIN 0.4 MG SL tablet  Commonly known as:  NITROSTAT  Place 0.4 mg under the tongue every 5 (five) minutes as needed for chest pain. Notify MD if not relieved by NTG     oxyCODONE-acetaminophen 10-325 MG tablet  Commonly known as:  PERCOCET  Take one tablet by mouth four times daily for chronic pain management. Do not exceed 4 gm of Tylenol in 24 hours     OXYGEN  Inhale 2 L into the lungs as needed.     promethazine 12.5 MG tablet  Commonly known as:  PHENERGAN  Take 12.5 mg by mouth every 6 (six) hours as needed for nausea or vomiting.     rosuvastatin 40 MG tablet  Commonly  known as:  CRESTOR  Take 40 mg by mouth at bedtime. Use brand name for insurance     sertraline 100 MG tablet  Commonly known  as:  ZOLOFT  Take 100 mg by mouth daily.     simethicone 80 MG chewable tablet  Commonly known as:  MYLICON  Chew 0000000 mg by mouth 3 (three) times daily.     spironolactone 25 MG tablet  Commonly known as:  ALDACTONE  Take 25 mg by mouth 2 (two) times daily.     SYSTANE ULTRA OP  Place 1 drop into both eyes at bedtime as needed. Wait 3 to 5 minutes between 2 eye meds     tiotropium 18 MCG inhalation capsule  Commonly known as:  SPIRIVA  Inhale 1 capsule (18 MCG CP Handihaler)  contents by taking two separate inhalations via handihaler device daily for COPD     TOUJEO SOLOSTAR 300 UNIT/ML Sopn  Generic drug:  Insulin Glargine  Inject 122 Units into the skin at bedtime.     VENTOLIN HFA 108 (90 Base) MCG/ACT inhaler  Generic drug:  albuterol  Inhale 2 puffs into the lungs every 8 (eight) hours as needed for wheezing or shortness of breath.     ALBUTEROL SULFATE PO  Take by mouth every 8 hours as needed for wheezing and shortness of breath.     zinc oxide 11.3 % Crea cream  Commonly known as:  BALMEX  Apply cream topically to buttocks and peri-anal as needed for redness        Review of Systems  Constitutional: Negative for fever, chills, activity change, appetite change and fatigue.  HENT:       Left ear pain with decreased hearing.   Respiratory: Negative for cough, chest tightness, shortness of breath and wheezing.   Cardiovascular: Negative for chest pain, palpitations and leg swelling.  Gastrointestinal: Negative for nausea, vomiting, abdominal pain, constipation and abdominal distention.  Genitourinary:       Incontinent   Musculoskeletal: Positive for gait problem.       Foot pain   Skin: Negative for color change, pallor and rash.       Skin tag   Neurological: Negative for dizziness, seizures, syncope, light-headedness and headaches.    Psychiatric/Behavioral: Negative.     Immunization History  Administered Date(s) Administered  . Influenza-Unspecified 06/19/2014, 06/22/2015  . PPD Test 12/26/2013, 01/12/2014, 05/18/2015  . Pneumococcal-Unspecified 07/02/2014   Pertinent  Health Maintenance Due  Topic Date Due  . PNA vac Low Risk Adult (1 of 2 - PCV13) 01/09/2016  . OPHTHALMOLOGY EXAM  09/11/2016 (Originally 09/01/2015)  . INFLUENZA VACCINE  04/11/2016  . HEMOGLOBIN A1C  06/13/2016  . FOOT EXAM  09/14/2016  . URINE MICROALBUMIN  12/02/2016  . MAMMOGRAM  12/01/2017  . PAP SMEAR  12/02/2018  . COLONOSCOPY  02/07/2024  . DEXA SCAN  Completed   No flowsheet data found. Functional Status Survey:    Filed Vitals:   02/21/16 1000  BP: 132/65  Pulse: 75  Temp: 97.5 F (36.4 C)  Resp: 18  Height: 5\' 3"  (1.6 m)  Weight: 202 lb 14.4 oz (92.035 kg)  SpO2: 98%   Body mass index is 35.95 kg/(m^2). Physical Exam  Constitutional: She is oriented to person, place, and time. She appears well-developed and well-nourished. No distress.  HENT:  Head: Normocephalic.  Right Ear: External ear normal.  Left Ear: External ear normal.  Mouth/Throat: Oropharynx is clear and moist.  Eyes: Conjunctivae and EOM are normal. Pupils are equal, round, and reactive to light. Right eye exhibits no discharge. Left eye exhibits no discharge. No scleral icterus.  Neck: Normal range of motion.  No JVD present. No thyromegaly present.  Cardiovascular: Normal rate, regular rhythm, normal heart sounds and intact distal pulses.  Exam reveals no gallop and no friction rub.   No murmur heard. Pulmonary/Chest: Effort normal and breath sounds normal. No respiratory distress. She has no wheezes. She has no rales.  Abdominal: Soft. Bowel sounds are normal. She exhibits no distension and no mass. There is no rebound and no guarding.  Musculoskeletal: She exhibits no edema or tenderness.  Bilateral foot drop. Hemiparalysis   Lymphadenopathy:     She has no cervical adenopathy.  Neurological: She is oriented to person, place, and time.  Skin: Skin is warm and dry. No rash noted. No erythema. No pallor.  Psychiatric: She has a normal mood and affect.    Labs reviewed:  Recent Labs  09/02/15 12/13/15  NA 139 142  K 4.2 4.3  BUN 19 11  CREATININE 0.7 0.7    Recent Labs  09/02/15  AST 22  ALT 16  ALKPHOS 113    Recent Labs  09/02/15 12/13/15  WBC 7.5 10.7  HGB 11.5* 10.4*  HCT 38 35*  PLT 270 246   Lab Results  Component Value Date   TSH 1.98 12/31/2015   Lab Results  Component Value Date   HGBA1C 8.8 12/13/2015   Lab Results  Component Value Date   CHOL 209* 01/04/2016   HDL 24* 01/04/2016   LDLCALC 134 01/04/2016   TRIG 258* 01/04/2016    Significant Diagnostic Results in last 30 days:  No results found.  Assessment/Plan 1. Hypertensive heart disease with heart failure (HCC) B/p stable. Continue on Spironolactone 25 mg tablet, Imdur 30 mg tablet . Monitor BMP   2. Chronic obstructive pulmonary disease, unspecified COPD type ([place]) Stable. Continue Spiriva 18 mcg inhalation capsule, albuterol inhaler as needed.   3. Uncontrolled type 2 diabetes mellitus with diabetic neuropathy, with long-term current use of insulin (HCC) CBG's ranging in 140's to 200's. Continue amaryl 2 mg tablet, Toujeo 122 untis, tradjenta 5 mg tablets, Metformin 1000 mg Tablet. Continue    CCD. Monitor Hgb A1C.Refer to [facility] for annual foot exam. Refer to Opthalmology for eye exam.      4. Neuropathic pain syndrome (non-herpetic) Continue gabapentin. Avoid escalating dosage.   5. Hypothyroidism, unspecified hypothyroidism type Continue Levothyroxine. Monitor TSH level.   6. Dyslipidemia Continue Crestor 40 mg tablet. Monitor lipid panel.     Family/ staff Communication: Reviewed plan of care with patient and facility Nurse supervisor  Labs/tests ordered: None

## 2016-03-01 DIAGNOSIS — H6123 Impacted cerumen, bilateral: Secondary | ICD-10-CM | POA: Diagnosis not present

## 2016-03-01 DIAGNOSIS — R6884 Jaw pain: Secondary | ICD-10-CM | POA: Diagnosis not present

## 2016-03-01 DIAGNOSIS — H9202 Otalgia, left ear: Secondary | ICD-10-CM | POA: Diagnosis not present

## 2016-03-01 DIAGNOSIS — J343 Hypertrophy of nasal turbinates: Secondary | ICD-10-CM | POA: Diagnosis not present

## 2016-03-01 DIAGNOSIS — H93293 Other abnormal auditory perceptions, bilateral: Secondary | ICD-10-CM | POA: Diagnosis not present

## 2016-03-01 DIAGNOSIS — Z8673 Personal history of transient ischemic attack (TIA), and cerebral infarction without residual deficits: Secondary | ICD-10-CM | POA: Diagnosis not present

## 2016-03-13 DIAGNOSIS — E1165 Type 2 diabetes mellitus with hyperglycemia: Secondary | ICD-10-CM | POA: Diagnosis not present

## 2016-03-13 LAB — HEMOGLOBIN A1C: Hemoglobin A1C: 9.3

## 2016-03-20 ENCOUNTER — Ambulatory Visit (INDEPENDENT_AMBULATORY_CARE_PROVIDER_SITE_OTHER): Payer: Medicare Other | Admitting: Sports Medicine

## 2016-03-20 ENCOUNTER — Encounter: Payer: Self-pay | Admitting: Sports Medicine

## 2016-03-20 DIAGNOSIS — I2510 Atherosclerotic heart disease of native coronary artery without angina pectoris: Secondary | ICD-10-CM | POA: Diagnosis not present

## 2016-03-20 DIAGNOSIS — I69354 Hemiplegia and hemiparesis following cerebral infarction affecting left non-dominant side: Secondary | ICD-10-CM | POA: Diagnosis not present

## 2016-03-20 DIAGNOSIS — M79672 Pain in left foot: Secondary | ICD-10-CM

## 2016-03-20 DIAGNOSIS — E1140 Type 2 diabetes mellitus with diabetic neuropathy, unspecified: Secondary | ICD-10-CM | POA: Diagnosis not present

## 2016-03-20 DIAGNOSIS — M79671 Pain in right foot: Secondary | ICD-10-CM

## 2016-03-20 DIAGNOSIS — E1165 Type 2 diabetes mellitus with hyperglycemia: Secondary | ICD-10-CM

## 2016-03-20 DIAGNOSIS — M21372 Foot drop, left foot: Secondary | ICD-10-CM

## 2016-03-20 NOTE — Progress Notes (Signed)
Patient ID: Susan [name], female   DOB: 08-02-1951, 65 y.o.   MRN: [id number]   Subjective: Susan [name] is a 65 y.o. female patient who returns to office for evaluation of bilateral foot pain. Patient states that there is constant burning pain in her Left>Right foot and desires medication; reports that the nurses at her facility has not been using capsiacin cream. Patient is transported by driver and no other staff member to relay any other concerns for the visit. Patient resides at [facility].   Patient Active Problem List   Diagnosis Date Noted  . CHF, stage C ([place]) 04/13/2015  . Major depression, chronic ([place]) 04/13/2015  . Uncontrolled diabetes mellitus ([place]) 12/14/2014  . Chronic idiopathic constipation 12/14/2014  . Hypertensive heart disease with heart failure ([place]) 12/14/2014  . Hyperlipidemia with target LDL less than 100 12/14/2014  . Abdominal distention 03/12/2014  . Anemia 02/24/2014  . RSD (reflex sympathetic dystrophy) 02/23/2014  . Edema 02/06/2014  . Insomnia 02/02/2014  . Type II diabetes mellitus with neurological manifestations, uncontrolled ([place]) 01/14/2014  . CVA (cerebral vascular accident) ([place]) 12/30/2013  . Hemiparesis affecting left side as late effect of cerebrovascular accident ([place]) 12/30/2013  . COPD (chronic obstructive pulmonary disease) ([place]) 12/30/2013  . CAD (coronary artery disease) 12/30/2013  . Hypothyroidism 12/30/2013  . Dyslipidemia 12/30/2013  . Neuropathic pain syndrome (non-herpetic) 12/30/2013  . Anxiety 12/30/2013  . Constipation 12/30/2013  . Smoker 12/30/2013  . Decreased potassium in the blood 08/06/2013  . Pressure ulcer, stage III ([place]) 08/05/2013  . Acute confusion due to medical condition 08/05/2013  . Cellulitis 07/23/2013  . Diabetes mellitus ([place]) 07/23/2013  . BP (high blood pressure) 07/23/2013  . Bed sore on buttock 07/23/2013  . 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 Hemiplegia ([place]) 07/22/2013   . Cutaneous eruption 07/22/2013  . Septic shock ([place]) 07/22/2013    Current Outpatient Prescriptions on File Prior to Visit  Medication Sig Dispense Refill  . albuterol (VENTOLIN HFA) 108 (90 Base) MCG/ACT inhaler Inhale 2 puffs into the lungs every 8 (eight) hours as needed for wheezing or shortness of breath.    . ALBUTEROL SULFATE PO Take by mouth every 8 hours as needed for wheezing and shortness of breath.    Marland Kitchen aspirin 81 MG chewable tablet Chew 81 mg by mouth daily.     . baclofen (LIORESAL) 10 MG tablet Take 10 mg by mouth 3 (three) times daily.     Marland Kitchen CAPSAICIN-MENTHOL EX Apply  0.025% cream topically to feet twice daily for pain.    . clonazePAM (KLONOPIN) 0.5 MG tablet Take 1 (0.5 mg tablet) by mouth every 6 hours as needed for anxiety    . cyclobenzaprine (FLEXERIL) 5 MG tablet Take 5 mg by mouth 2 (two) times daily.     Marland Kitchen docusate sodium (COLACE) 100 MG capsule Take 2 capsules (200 mg total) by mouth 2 (two) times daily. 100 capsule 11  . fluticasone (FLONASE) 50 MCG/ACT nasal spray Place 2 sprays into both nostrils daily.     . furosemide (LASIX) 20 MG tablet Take 20 mg by mouth daily.    Marland Kitchen gabapentin (NEURONTIN) 400 MG capsule Take 1 (400 mg) capsule by mouth three times a day with meals. Also take 3 (1200mg ) capsules by mouth daily at bedtime.    Marland Kitchen glimepiride (AMARYL) 2 MG tablet Take 2 mg by mouth daily with breakfast.     . hydrOXYzine (ATARAX/VISTARIL) 25 MG tablet Take 25 mg by  mouth every 8 (eight) hours as needed for itching.    . Insulin Glargine (TOUJEO SOLOSTAR) 300 UNIT/ML SOPN Inject 122 Units into the skin at bedtime.    . insulin lispro (HUMALOG) 100 UNIT/ML injection Inject 36 Units into the skin 3 (three) times daily before meals. For CBG >/= 150 inject 10 additional units subcutaneously    . isosorbide mononitrate (IMDUR) 30 MG 24 hr tablet Take 30 mg by mouth daily.     Marland Kitchen levothyroxine (SYNTHROID, LEVOTHROID) 25 MCG tablet Take 25 mcg by mouth daily before  breakfast.    . Linaclotide (LINZESS) 145 MCG CAPS capsule Take 145 mcg by mouth daily. Give 30 minutes prior to first meal and taken on an empty stomach.    . linagliptin (TRADJENTA) 5 MG TABS tablet Take 5 mg by mouth daily.     Marland Kitchen MELATONIN PO Take 6 mg by mouth at bedtime.     . metFORMIN (GLUCOPHAGE) 1000 MG tablet Take 1,000 mg by mouth 2 (two) times daily with a meal.     . mirtazapine (REMERON) 30 MG tablet Take 30 mg by mouth at bedtime.    . nitroGLYCERIN (NITROSTAT) 0.4 MG SL tablet Place 0.4 mg under the tongue every 5 (five) minutes as needed for chest pain. Notify MD if not relieved by NTG    . oxyCODONE-acetaminophen (PERCOCET) 10-325 MG tablet Take one tablet by mouth four times daily for chronic pain management. Do not exceed 4 gm of Tylenol in 24 hours 120 tablet 0  . OXYGEN Inhale 2 L into the lungs as needed.    Vladimir Faster Glycol-Propyl Glycol (SYSTANE ULTRA OP) Place 1 drop into both eyes at bedtime as needed. Wait 3 to 5 minutes between 2 eye meds    . promethazine (PHENERGAN) 12.5 MG tablet Take 12.5 mg by mouth every 6 (six) hours as needed for nausea or vomiting.    . rosuvastatin (CRESTOR) 40 MG tablet Take 40 mg by mouth at bedtime. Use brand name for insurance    . sertraline (ZOLOFT) 100 MG tablet Take 100 mg by mouth daily.    . simethicone (MYLICON) 80 MG chewable tablet Chew 160 mg by mouth 3 (three) times daily.     Marland Kitchen spironolactone (ALDACTONE) 25 MG tablet Take 25 mg by mouth 2 (two) times daily.     Marland Kitchen tiotropium (SPIRIVA) 18 MCG inhalation capsule Inhale 1 capsule (18 MCG CP Handihaler)  contents by taking two separate inhalations via handihaler device daily for COPD    . zinc oxide (BALMEX) 11.3 % CREA cream Apply cream topically to buttocks and peri-anal as needed for redness     No current facility-administered medications on file prior to visit.    Allergies  Allergen Reactions  . Onion Shortness Of Breath and Swelling  . Pollen Extract Itching  .  Morphine And Related Hives    PT DENIES ALLERGY  . Niaspan [Niacin Er] Other (See Comments)    unknown  . Other     onions  . Trazodone And Nefazodone Other (See Comments)    Hallucinations  . Valium [Diazepam] Other (See Comments)    unknown  . Wellbutrin [Bupropion] Other (See Comments)    unknown    Objective:  General: Alert and oriented x3 in no acute distress  Dermatology: No open lesions bilateral lower extremities, no webspace macerations, no ecchymosis bilateral, all nails x 10 are thickened and mildly elongated, asymptomatic.  Vascular: Dorsalis Pedis and Posterior Tibial pedal pulses 1/4  faint, CRT 5 seconds, Temperature decreased bilateral.  Neurology: Gross sensation intact via light touch bilateral, Protective sensation severely diminished   with Semmes Weinstein Monofilament to all pedal sites, vibratory absent  Bilateral.  Musculoskeletal: No tenderness with palpation diffusely bilateral,No pain with calf compression bilateral. There is drop foot on left. No deformity on right. Strength 3/5 on left and 4/5 on right.   Assessment and Plan: Problem List Items Addressed This Visit      Endocrine   Type II diabetes mellitus with neurological manifestations, uncontrolled ([place])     Nervous and Auditory   Hemiparesis affecting left side as late effect of cerebrovascular accident [facility])    Other Visit Diagnoses    Foot drop, left    -  Primary    Foot pain, bilateral           -Complete examination performed -Discussed treatement options; due to patient's non-ambulatory status and co-morbidities do not recommend extensive bracing or surgical intervention at this time -Recommend Capsiacin cream daily to both feet for pain and wrote clear instruction for Bid application for patient's pain  -Continue with current medication management; patient is on Gabapentin as well as narcotics -Patient to return to office as needed or sooner if condition worsens.  Landis Martins,  DPM

## 2016-03-29 ENCOUNTER — Non-Acute Institutional Stay (SKILLED_NURSING_FACILITY): Payer: Medicare Other | Admitting: Family

## 2016-03-29 ENCOUNTER — Encounter: Payer: Self-pay | Admitting: Family

## 2016-03-29 DIAGNOSIS — I509 Heart failure, unspecified: Secondary | ICD-10-CM

## 2016-03-29 DIAGNOSIS — F329 Major depressive disorder, single episode, unspecified: Secondary | ICD-10-CM | POA: Diagnosis not present

## 2016-03-29 DIAGNOSIS — E1140 Type 2 diabetes mellitus with diabetic neuropathy, unspecified: Secondary | ICD-10-CM

## 2016-03-29 DIAGNOSIS — E1165 Type 2 diabetes mellitus with hyperglycemia: Secondary | ICD-10-CM

## 2016-03-29 DIAGNOSIS — E785 Hyperlipidemia, unspecified: Secondary | ICD-10-CM

## 2016-03-29 DIAGNOSIS — B351 Tinea unguium: Secondary | ICD-10-CM | POA: Diagnosis not present

## 2016-03-29 DIAGNOSIS — IMO0002 Reserved for concepts with insufficient information to code with codable children: Secondary | ICD-10-CM

## 2016-03-29 DIAGNOSIS — Z794 Long term (current) use of insulin: Secondary | ICD-10-CM | POA: Diagnosis not present

## 2016-03-29 DIAGNOSIS — I739 Peripheral vascular disease, unspecified: Secondary | ICD-10-CM | POA: Diagnosis not present

## 2016-03-29 DIAGNOSIS — E119 Type 2 diabetes mellitus without complications: Secondary | ICD-10-CM | POA: Diagnosis not present

## 2016-03-29 DIAGNOSIS — M79674 Pain in right toe(s): Secondary | ICD-10-CM | POA: Diagnosis not present

## 2016-03-29 DIAGNOSIS — J449 Chronic obstructive pulmonary disease, unspecified: Secondary | ICD-10-CM | POA: Diagnosis not present

## 2016-03-29 DIAGNOSIS — I110 Hypertensive heart disease with heart failure: Secondary | ICD-10-CM

## 2016-03-29 NOTE — Progress Notes (Signed)
Location:   [facility] Room Number: 1001-A Place of Service:  SNF (31) Provider: Marlowe Sax FNP-C   Patient Care Team: Blanchie Serve, MD as PCP - General (Internal Medicine) Sandrea Hughs, NP as Nurse Practitioner [facility])  Extended Emergency Contact Information Primary Emergency Contact: Roblstow,Beth Address: [address]          [place], [place] [address] Johnnette Litter of [place] Phone: [phone] Work Phone: [phone] Mobile Phone: [phone] Relation: Daughter Secondary Emergency Contact: Suk,Scott Address: [address]          [place], [place] [address] Johnnette Litter of [place] Phone: [phone] Mobile Phone: [phone] Relation: Spouse  Code Status:  DNR  Goals of care: Advanced Directive information Advanced Directives 01/03/2016  Does patient have an advance directive? Yes  Type of Advance Directive Out of facility DNR (pink MOST or yellow form)  Does patient want to make changes to advanced directive? -  Copy of advanced directive(s) in chart? Yes     Chief Complaint  Patient presents with  . Medical Management of Chronic Issues    HPI:  Pt is a 65 y.o. female seen today at [facility]  for medical management of chronic diseases.shre is seen in her room today. She denies any acute issues. She continues to require total care assistance with ADL's. No skin breakdown, fall episodes or hospital admission since previous visit. She was seen by Podiatrist 03/20/2016 recommended patient to continue on Capsaicin cream and Gabapentin for neuropathy. Recent Hgb A1C 9.3 ( 03/13/2016) medication adjusted CBG's stable in the 140's - to low 200's. Facility staff report no new concerns.     Past Medical History  Diagnosis Date  . Hyperlipidemia   . Diabetes mellitus without complication ([place])   . COPD (chronic obstructive pulmonary disease) ([place])   . Depression   . Hypertension     . Thyroid disease   . Anemia   . CAD (coronary artery disease)   . Chronic pain   . Hemiplegia affecting non-dominant side, post-stroke ([place])   . Fall   . Urine retention   . Stroke [facility]) 12/2013    left side paralysis   Past Surgical History  Procedure Laterality Date  . Joint replacement      bil hip  . Abdominal hysterectomy    . Cholecystectomy    . Appendectomy    . Tonsillectomy      Allergies  Allergen Reactions  . Onion Shortness Of Breath and Swelling  . Pollen Extract Itching  . Morphine And Related Hives    PT DENIES ALLERGY  . Niaspan [Niacin Er] Other (See Comments)    unknown  . Other     onions  . Trazodone And Nefazodone Other (See Comments)    Hallucinations  . Valium [Diazepam] Other (See Comments)    unknown  . Wellbutrin [Bupropion] Other (See Comments)    unknown      Medication List       This list is accurate as of: 03/29/16 12:52 PM.  Always use your most recent med list.               aspirin 81 MG chewable tablet  Chew 81 mg by mouth daily.     baclofen 10 MG tablet  Commonly known as:  LIORESAL  Take 10 mg by mouth 3 (three) times daily. For muscle spasms     CAPSAICIN-MENTHOL EX  Apply  0.025% cream topically to feet twice daily for pain.     clonazePAM 0.5 MG tablet  Commonly known as:  KLONOPIN  Take 1 (0.5 mg tablet) by mouth every 6 hours as needed for anxiety     cyclobenzaprine 5 MG tablet  Commonly known as:  FLEXERIL  Take 5 mg by mouth 2 (two) times daily.     docusate sodium 100 MG capsule  Commonly known as:  COLACE  Take 2 capsules (200 mg total) by mouth 2 (two) times daily.     fluticasone 50 MCG/ACT nasal spray  Commonly known as:  FLONASE  Place 2 sprays into both nostrils daily. For chronic sinus infections     furosemide 20 MG tablet  Commonly known as:  LASIX  Take 20 mg by mouth daily.     gabapentin 400 MG capsule  Commonly known as:  NEURONTIN  Take 1 (400 mg) capsule by mouth three times  a day with meals. Also take 3 (1200mg ) capsules by mouth daily at bedtime.     glimepiride 2 MG tablet  Commonly known as:  AMARYL  Take 2 mg by mouth daily with breakfast.     hydrOXYzine 25 MG tablet  Commonly known as:  ATARAX/VISTARIL  Take 25 mg by mouth every 8 (eight) hours as needed for itching.     insulin lispro 100 UNIT/ML injection  Commonly known as:  HUMALOG  Inject 36 Units into the skin 3 (three) times daily before meals. For CBG >/= 150 inject 10 additional units subcutaneously     isosorbide mononitrate 30 MG 24 hr tablet  Commonly known as:  IMDUR  Take 30 mg by mouth daily.     levothyroxine 25 MCG tablet  Commonly known as:  SYNTHROID, LEVOTHROID  Take 25 mcg by mouth daily before breakfast.     linaclotide 145 MCG Caps capsule  Commonly known as:  LINZESS  Take 145 mcg by mouth daily. Give 30 minutes prior to first meal and taken on an empty stomach.     linagliptin 5 MG Tabs tablet  Commonly known as:  TRADJENTA  Take 5 mg by mouth daily.     MELATONIN PO  Take 6 mg by mouth at bedtime.     metFORMIN 1000 MG tablet  Commonly known as:  GLUCOPHAGE  Take 1,000 mg by mouth 2 (two) times daily with a meal.     mirtazapine 30 MG tablet  Commonly known as:  REMERON  Take 30 mg by mouth at bedtime.     nitroGLYCERIN 0.4 MG SL tablet  Commonly known as:  NITROSTAT  Place 0.4 mg under the tongue every 5 (five) minutes as needed for chest pain. Notify MD if not relieved by NTG     oxyCODONE-acetaminophen 10-325 MG tablet  Commonly known as:  PERCOCET  Take one tablet by mouth four times daily for chronic pain management. Do not exceed 4 gm of Tylenol in 24 hours     OXYGEN  Inhale 2 L into the lungs as needed.     promethazine 12.5 MG tablet  Commonly known as:  PHENERGAN  Take 12.5 mg by mouth every 6 (six) hours as needed for nausea or vomiting.     rosuvastatin 40 MG tablet  Commonly known as:  CRESTOR  Take 40 mg by mouth at bedtime. Use  brand name for insurance     sertraline 100 MG tablet  Commonly known as:  ZOLOFT  Take 100 mg by mouth  daily. For depression     simethicone 80 MG chewable tablet  Commonly known as:  MYLICON  Chew 0000000 mg by mouth 3 (three) times daily.     spironolactone 25 MG tablet  Commonly known as:  ALDACTONE  Take 25 mg by mouth 2 (two) times daily. For edema     SYSTANE ULTRA OP  Place 1 drop into both eyes at bedtime as needed. Wait 3 to 5 minutes between 2 eye meds     tiotropium 18 MCG inhalation capsule  Commonly known as:  SPIRIVA  Inhale 1 capsule (18 MCG CP Handihaler)  contents by taking two separate inhalations via handihaler device daily for COPD     TOUJEO SOLOSTAR 300 UNIT/ML Sopn  Generic drug:  Insulin Glargine  Inject 122 Units into the skin at bedtime.     VENTOLIN HFA 108 (90 Base) MCG/ACT inhaler  Generic drug:  albuterol  Inhale 2 puffs into the lungs every 8 (eight) hours as needed for wheezing or shortness of breath.     zinc oxide 11.3 % Crea cream  Commonly known as:  BALMEX  Apply cream topically to buttocks and peri-anal as needed for redness        Review of Systems  Constitutional: Negative for fever, chills, activity change, appetite change and fatigue.  HENT: Negative for congestion, rhinorrhea, sinus pressure, sneezing and sore throat.   Eyes: Negative.   Respiratory: Negative for cough, chest tightness, shortness of breath and wheezing.   Cardiovascular: Negative for chest pain, palpitations and leg swelling.  Gastrointestinal: Negative for nausea, vomiting, abdominal pain, constipation and abdominal distention.  Genitourinary: Negative for dysuria, urgency and flank pain.       Incontinent   Musculoskeletal: Positive for gait problem.       Foot pain   Skin: Negative for color change, pallor and rash.  Neurological: Negative for dizziness, seizures, syncope, light-headedness and headaches.  Psychiatric/Behavioral: Negative.  Negative for  hallucinations, confusion, sleep disturbance and agitation. The patient is not nervous/anxious.     Immunization History  Administered Date(s) Administered  . Influenza-Unspecified 06/19/2014, 06/22/2015  . PPD Test 12/26/2013, 01/12/2014, 05/18/2015  . Pneumococcal-Unspecified 07/02/2014   Pertinent  Health Maintenance Due  Topic Date Due  . PNA vac Low Risk Adult (1 of 2 - PCV13) 01/09/2016  . OPHTHALMOLOGY EXAM  09/11/2016 (Originally 09/01/2015)  . INFLUENZA VACCINE  04/11/2016  . HEMOGLOBIN A1C  06/13/2016  . FOOT EXAM  09/14/2016  . URINE MICROALBUMIN  12/02/2016  . MAMMOGRAM  12/01/2017  . PAP SMEAR  12/02/2018  . COLONOSCOPY  02/07/2024  . DEXA SCAN  Completed   No flowsheet data found. Functional Status Survey:    Filed Vitals:   03/29/16 1216  BP: 126/80  Pulse: 97  Temp: 97.4 F (36.3 C)  Resp: 20  Height: 5\' 3"  (1.6 m)  Weight: 202 lb 14.4 oz (92.035 kg)   Body mass index is 35.95 kg/(m^2). Physical Exam  Constitutional: She is oriented to person, place, and time. She appears well-developed and well-nourished. No distress.  HENT:  Head: Normocephalic.  Right Ear: External ear normal.  Left Ear: External ear normal.  Mouth/Throat: Oropharynx is clear and moist.  Eyes: Conjunctivae and EOM are normal. Pupils are equal, round, and reactive to light. Right eye exhibits no discharge. Left eye exhibits no discharge. No scleral icterus.  Neck: Normal range of motion. No JVD present. No thyromegaly present.  Cardiovascular: Normal rate, regular rhythm, normal heart sounds and intact  distal pulses.  Exam reveals no gallop and no friction rub.   No murmur heard. Pulmonary/Chest: Effort normal and breath sounds normal. No respiratory distress. She has no wheezes. She has no rales.  Abdominal: Soft. Bowel sounds are normal. She exhibits no distension and no mass. There is no rebound and no guarding.  Musculoskeletal: She exhibits no edema or tenderness.  Bilateral  foot drop. Hemiparalysis   Lymphadenopathy:    She has no cervical adenopathy.  Neurological: She is oriented to person, place, and time.  Skin: Skin is warm and dry. No rash noted. No erythema. No pallor.  Psychiatric: She has a normal mood and affect.    Labs reviewed:  Recent Labs  09/02/15 12/13/15  NA 139 142  K 4.2 4.3  BUN 19 11  CREATININE 0.7 0.7    Recent Labs  09/02/15  AST 22  ALT 16  ALKPHOS 113    Recent Labs  09/02/15 12/13/15  WBC 7.5 10.7  HGB 11.5* 10.4*  HCT 38 35*  PLT 270 246   Lab Results  Component Value Date   TSH 1.98 12/31/2015   Lab Results  Component Value Date   HGBA1C 9.3 03/13/2016   Lab Results  Component Value Date   CHOL 209* 01/04/2016   HDL 24* 01/04/2016   LDLCALC 134 01/04/2016   TRIG 258* 01/04/2016    Significant Diagnostic Results in last 30 days:  No results found.  Assessment/Plan COPD Stable. Continue on Albuterol and Spiriva.   HTN B/p stable. Continue on Imdur 30 mg Tablet and Aldactone 25 mg tablet. Monitor BMP  CHF Stable. No shortness of breath, cough, edema, or wheezing. No recent weight gain. Continue on Imdur 30 mg Tablet, Furosemide 20 mg tablet and Aldactone 25 mg tablet. Monitor weight.   Type 2 DM Hgb A1C 9.3 ( 03/13/2016). Tujeo adjusted. Continue current medication. Monitor CBG's and Hgb A1C. Had foot exam 03/20/2016 PVD with neuropathy. Awaiting eye exam.   Depression Continue on Zoloft 100 mg tablet.  Hyperlipidemia  Continue on Crestor 40 mg tablet.     Family/ staff Communication: Reviewed plan of care with patient and facility Nurse supervisor.   Labs/tests ordered: None

## 2016-04-12 DIAGNOSIS — F411 Generalized anxiety disorder: Secondary | ICD-10-CM | POA: Diagnosis not present

## 2016-04-12 DIAGNOSIS — G4700 Insomnia, unspecified: Secondary | ICD-10-CM | POA: Diagnosis not present

## 2016-04-12 DIAGNOSIS — F329 Major depressive disorder, single episode, unspecified: Secondary | ICD-10-CM | POA: Diagnosis not present

## 2016-04-17 ENCOUNTER — Other Ambulatory Visit: Payer: Self-pay

## 2016-04-17 MED ORDER — OXYCODONE-ACETAMINOPHEN 10-325 MG PO TABS: ORAL_TABLET | ORAL | 0 refills | Status: DC

## 2016-04-17 NOTE — Telephone Encounter (Signed)
Rx faxed to [facility] @ [phone], phone number [phone]  

## 2016-04-18 DIAGNOSIS — M6281 Muscle weakness (generalized): Secondary | ICD-10-CM | POA: Diagnosis not present

## 2016-04-18 DIAGNOSIS — R278 Other lack of coordination: Secondary | ICD-10-CM | POA: Diagnosis not present

## 2016-04-19 DIAGNOSIS — R278 Other lack of coordination: Secondary | ICD-10-CM | POA: Diagnosis not present

## 2016-04-19 DIAGNOSIS — M6281 Muscle weakness (generalized): Secondary | ICD-10-CM | POA: Diagnosis not present

## 2016-04-20 DIAGNOSIS — R278 Other lack of coordination: Secondary | ICD-10-CM | POA: Diagnosis not present

## 2016-04-20 DIAGNOSIS — M6281 Muscle weakness (generalized): Secondary | ICD-10-CM | POA: Diagnosis not present

## 2016-04-21 DIAGNOSIS — R278 Other lack of coordination: Secondary | ICD-10-CM | POA: Diagnosis not present

## 2016-04-21 DIAGNOSIS — M6281 Muscle weakness (generalized): Secondary | ICD-10-CM | POA: Diagnosis not present

## 2016-04-24 DIAGNOSIS — M6281 Muscle weakness (generalized): Secondary | ICD-10-CM | POA: Diagnosis not present

## 2016-04-24 DIAGNOSIS — R278 Other lack of coordination: Secondary | ICD-10-CM | POA: Diagnosis not present

## 2016-04-25 DIAGNOSIS — R278 Other lack of coordination: Secondary | ICD-10-CM | POA: Diagnosis not present

## 2016-04-25 DIAGNOSIS — M6281 Muscle weakness (generalized): Secondary | ICD-10-CM | POA: Diagnosis not present

## 2016-04-26 DIAGNOSIS — M6281 Muscle weakness (generalized): Secondary | ICD-10-CM | POA: Diagnosis not present

## 2016-04-26 DIAGNOSIS — R278 Other lack of coordination: Secondary | ICD-10-CM | POA: Diagnosis not present

## 2016-04-27 DIAGNOSIS — M6281 Muscle weakness (generalized): Secondary | ICD-10-CM | POA: Diagnosis not present

## 2016-04-27 DIAGNOSIS — R278 Other lack of coordination: Secondary | ICD-10-CM | POA: Diagnosis not present

## 2016-04-28 DIAGNOSIS — R278 Other lack of coordination: Secondary | ICD-10-CM | POA: Diagnosis not present

## 2016-04-28 DIAGNOSIS — M6281 Muscle weakness (generalized): Secondary | ICD-10-CM | POA: Diagnosis not present

## 2016-05-01 ENCOUNTER — Non-Acute Institutional Stay (SKILLED_NURSING_FACILITY): Payer: Medicare Other | Admitting: Internal Medicine

## 2016-05-01 ENCOUNTER — Encounter: Payer: Self-pay | Admitting: Internal Medicine

## 2016-05-01 DIAGNOSIS — E1165 Type 2 diabetes mellitus with hyperglycemia: Secondary | ICD-10-CM

## 2016-05-01 DIAGNOSIS — G8114 Spastic hemiplegia affecting left nondominant side: Secondary | ICD-10-CM

## 2016-05-01 DIAGNOSIS — G8112 Spastic hemiplegia affecting left dominant side: Secondary | ICD-10-CM | POA: Diagnosis not present

## 2016-05-01 DIAGNOSIS — R278 Other lack of coordination: Secondary | ICD-10-CM | POA: Diagnosis not present

## 2016-05-01 DIAGNOSIS — F329 Major depressive disorder, single episode, unspecified: Secondary | ICD-10-CM

## 2016-05-01 DIAGNOSIS — I110 Hypertensive heart disease with heart failure: Secondary | ICD-10-CM | POA: Diagnosis not present

## 2016-05-01 DIAGNOSIS — R110 Nausea: Secondary | ICD-10-CM | POA: Diagnosis not present

## 2016-05-01 DIAGNOSIS — E669 Obesity, unspecified: Secondary | ICD-10-CM

## 2016-05-01 DIAGNOSIS — E1140 Type 2 diabetes mellitus with diabetic neuropathy, unspecified: Secondary | ICD-10-CM

## 2016-05-01 DIAGNOSIS — E785 Hyperlipidemia, unspecified: Secondary | ICD-10-CM | POA: Diagnosis not present

## 2016-05-01 DIAGNOSIS — K219 Gastro-esophageal reflux disease without esophagitis: Secondary | ICD-10-CM | POA: Diagnosis not present

## 2016-05-01 DIAGNOSIS — M6281 Muscle weakness (generalized): Secondary | ICD-10-CM | POA: Diagnosis not present

## 2016-05-01 DIAGNOSIS — IMO0002 Reserved for concepts with insufficient information to code with codable children: Secondary | ICD-10-CM

## 2016-05-01 NOTE — Progress Notes (Signed)
Patient ID: Susan [name], female   DOB: [birth year], 65 y.o.   MRN: [id number]     [facility]  PCP: Susan Serve, MD  Code Status: DNR  Allergies  Allergen Reactions  . Onion Shortness Of Breath and Swelling  . Pollen Extract Itching  . Morphine And Related Hives    PT DENIES ALLERGY  . Niaspan [Niacin Er] Other (See Comments)    unknown  . Other     onions  . Trazodone And Nefazodone Other (See Comments)    Hallucinations  . Valium [Diazepam] Other (See Comments)    unknown  . Wellbutrin [Bupropion] Other (See Comments)    unknown    Chief Complaint  Patient presents with  . Medical Management of Chronic Issues    Routine Visit    Advanced Directives 01/03/2016  Does patient have an advance directive? Yes  Type of Advance Directive Out of facility DNR (pink MOST or yellow form)  Does patient want to make changes to advanced directive? -  Copy of advanced directive(s) in chart? Yes    HPI 65 y/o female patient is seen for routine visit. She complaints of occasional nausea and has heartburn. Her pain continues but mentions that the pain medications helps. She is out of bed daily and participates in group activities at times. She has medication compliance. No dietary compliance. Elevated cbg reading reported.   Review of Systems  Constitutional: Negative for fever, chills HENT: Negative for sore throat.   Eyes: Negative for blurred vision, double vision and discharge.  Respiratory: Negative for cough, shortness of breath and wheezing.   Cardiovascular: Negative for chest pain, palpitations.  Gastrointestinal: Negative for abdominal pain and vomiting   Genitourinary: Negative for dysuria  Musculoskeletal: Negative for fall Skin: Negative for rash, wounds  Neurological: Negative for dizziness, headache  Psychiatric/Behavioral: history of depression and followed by psych services   Past Medical History:  Diagnosis Date  . Anemia   . CAD  (coronary artery disease)   . Chronic pain   . COPD (chronic obstructive pulmonary disease) ([place])   . Depression   . Diabetes mellitus without complication ([place])   . Fall   . Hemiplegia affecting non-dominant side, post-stroke ([place])   . Hyperlipidemia   . Hypertension   . Stroke ([place]) 12/2013   left side paralysis  . Thyroid disease   . Urine retention      Medication List       Accurate as of 05/01/16  2:11 PM. Always use your most recent med list.          aspirin 81 MG chewable tablet Chew 81 mg by mouth daily.   baclofen 10 MG tablet Commonly known as:  LIORESAL Take 10 mg by mouth 3 (three) times daily. For muscle spasms   CAPSAICIN-MENTHOL EX Apply  0.025% cream topically to feet twice daily for pain.   clonazePAM 0.5 MG tablet Commonly known as:  KLONOPIN Take 1 (0.5 mg tablet) by mouth every 6 hours as needed for anxiety   cyclobenzaprine 5 MG tablet Commonly known as:  FLEXERIL Take 5 mg by mouth 2 (two) times daily.   docusate sodium 100 MG capsule Commonly known as:  COLACE Take 2 capsules (200 mg total) by mouth 2 (two) times daily.   fluticasone 50 MCG/ACT nasal spray Commonly known as:  FLONASE Place 2 sprays into both nostrils daily. For chronic sinus infections   furosemide 20 MG tablet Commonly known as:  LASIX Take  20 mg by mouth daily.   gabapentin 400 MG capsule Commonly known as:  NEURONTIN Take 1 (400 mg) capsule by mouth three times a day with meals. Also take 3 (1200mg ) capsules by mouth daily at bedtime.   glimepiride 2 MG tablet Commonly known as:  AMARYL Take 2 mg by mouth daily with breakfast.   hydrOXYzine 25 MG tablet Commonly known as:  ATARAX/VISTARIL Take 25 mg by mouth every 8 (eight) hours as needed for itching.   insulin lispro 100 UNIT/ML injection Commonly known as:  HUMALOG Inject 36 Units into the skin 3 (three) times daily before meals. For CBG >/= 150 inject 10 additional units subcutaneously   isosorbide  mononitrate 30 MG 24 hr tablet Commonly known as:  IMDUR Take 30 mg by mouth daily.   levothyroxine 25 MCG tablet Commonly known as:  SYNTHROID, LEVOTHROID Take 25 mcg by mouth daily before breakfast.   linaclotide 145 MCG Caps capsule Commonly known as:  LINZESS Take 145 mcg by mouth daily. Give 30 minutes prior to first meal and taken on an empty stomach.   linagliptin 5 MG Tabs tablet Commonly known as:  TRADJENTA Take 5 mg by mouth daily.   MELATONIN PO Take 6 mg by mouth at bedtime.   metFORMIN 1000 MG tablet Commonly known as:  GLUCOPHAGE Take 1,000 mg by mouth 2 (two) times daily with a meal.   mirtazapine 30 MG tablet Commonly known as:  REMERON Take 30 mg by mouth at bedtime.   nitroGLYCERIN 0.4 MG SL tablet Commonly known as:  NITROSTAT Place 0.4 mg under the tongue every 5 (five) minutes as needed for chest pain. Notify MD if not relieved by NTG   oxyCODONE-acetaminophen 10-325 MG tablet Commonly known as:  PERCOCET Take one tablet by mouth four times daily for chronic pain management. Do not exceed 4 gm of Tylenol in 24 hours   OXYGEN Inhale 2 L into the lungs as needed.   promethazine 12.5 MG tablet Commonly known as:  PHENERGAN Take 12.5 mg by mouth every 6 (six) hours as needed for nausea or vomiting.   rosuvastatin 40 MG tablet Commonly known as:  CRESTOR Take 40 mg by mouth at bedtime. Use brand name for insurance   sertraline 100 MG tablet Commonly known as:  ZOLOFT Take 100 mg by mouth daily. For depression   simethicone 80 MG chewable tablet Commonly known as:  MYLICON Chew 0000000 mg by mouth 3 (three) times daily.   spironolactone 25 MG tablet Commonly known as:  ALDACTONE Take 25 mg by mouth 2 (two) times daily. For edema   SYSTANE ULTRA OP Place 1 drop into both eyes at bedtime as needed. Wait 3 to 5 minutes between 2 eye meds   tiotropium 18 MCG inhalation capsule Commonly known as:  SPIRIVA Inhale 1 capsule (18 MCG CP Handihaler)   contents by taking two separate inhalations via handihaler device daily for COPD   TOUJEO SOLOSTAR 300 UNIT/ML Sopn Generic drug:  Insulin Glargine Inject 122 Units into the skin at bedtime.   VENTOLIN HFA 108 (90 Base) MCG/ACT inhaler Generic drug:  albuterol Inhale 2 puffs into the lungs every 8 (eight) hours as needed for wheezing or shortness of breath.   zinc oxide 11.3 % Crea cream Commonly known as:  BALMEX Apply cream topically to buttocks and peri-anal as needed for redness      Physical exam BP 114/70   Pulse 68   Temp 98.6 F (37 C) (Oral)  Resp 20   Ht 5\' 3"  (1.6 m)   Wt 211 lb 3.2 oz (95.8 kg)   BMI 37.41 kg/m   Wt Readings from Last 3 Encounters:  05/01/16 211 lb 3.2 oz (95.8 kg)  03/29/16 202 lb 14.4 oz (92 kg)  02/21/16 202 lb 14.4 oz (92 kg)   General- elderly obese female in no acute distress Head- atraumatic, normocephalic Eyes- no pallor, no icterus, no discharge Neck- no lymphadenopathy Mouth- normal mucus membrane, upper and lower dentures Cardiovascular- normal s1,s2, no murmur Respiratory- bilateral clear to auscultation, no wheeze, no rhonchi, no crackles Abdomen- bowel sounds present, soft, non tender Musculoskeletal- able to move right upper and lower extremities, left sided hemiplegia with left foot drop, no leg edema Neurological- aao x 3 Skin- warm and dry      Labs  CBC Latest Ref Rng & Units 12/13/2015 09/02/2015 02/17/2015  WBC 10:3/mL 10.7 7.5 8.3  Hemoglobin 12.0 - 16.0 g/dL 10.4(A) 11.5(A) 10.6(A)  Hematocrit 36 - 46 % 35(A) 38 34(A)  Platelets 150 - 399 K/L 246 270 221   CMP Latest Ref Rng & Units 12/13/2015 09/02/2015 02/17/2015  Glucose 70 - 99 mg/dL - - -  BUN 4 - 21 mg/dL 11 19 15   Creatinine 0.5 - 1.1 mg/dL 0.7 0.7 0.7  Sodium 137 - 147 mmol/L 142 139 138  Potassium 3.4 - 5.3 mmol/L 4.3 4.2 3.5  Chloride 96 - 112 mEq/L - - -  CO2 19 - 32 mEq/L - - -  Calcium 8.4 - 10.5 mg/dL - - -  Total Protein 6.0 - 8.3 g/dL - - -    Total Bilirubin 0.3 - 1.2 mg/dL - - -  Alkaline Phos 25 - 125 U/L - 113 94  AST 13 - 35 U/L - 22 13  ALT 7 - 35 U/L - 16 12   Lab Results  Component Value Date   HGBA1C 9.3 03/13/2016   Lab Results  Component Value Date   TSH 1.98 12/31/2015   Lipid Panel     Component Value Date/Time   CHOL 209 (A) 01/04/2016   TRIG 258 (A) 01/04/2016   HDL 24 (A) 01/04/2016   LDLCALC 134 01/04/2016      Assessment/plan  Uncontrolled diabetes with neuropathy Lab Results  Component Value Date   HGBA1C 9.3 03/13/2016   cbg suggestive of uncontrolled DM with cbg mostly in 200s and 300s. Currently on tradjenta 5 mg daily, glimepiride 2 mg daily and metformin 1000 mg bid. Also on toujeo 122 u daily with humalog 36 u tid with meals. Continue gabapentin 400 mg tid. Increase toujeo to 127 u daily and monitor cbg  Left sided hemiplegia Post CVA. Continue flexeril 5 mg bid with baclofen 10 mg tid for muscle spasticity with percocet 5-325 mg qid.   HLD Reviewed lipid panel. Continue crestor and monitor  Hypertensive heart disease with heart failure Currently on imdur 30 mg daily, aldactone 25 mg bid, lasix 20 mg daily. Monitor BP and BMP.   Obesity Dietary non compliance noted. On remeron that can stimulate appetite. Taper remeron as below  Nausea Continue phenergan 12.5 mg q6h prn, there likely is a component on gastroparesis with her DM. Small portion meals encouraged. monitor  Chronic depression On zoloft 100 mg daily. Increase this to 125 mg daily. Taper remeron to 15 mg daily x 1 week and then 7.5 mg daily for a week and discontinue for GDR.   GERD Start omeprazole 20 mg daily and monitor  Susan Serve, MD Internal Medicine [facility] [address] [place], [place] [address] Cell Phone (Monday-Friday 8 am - 5 pm): [phone] On Call: [phone] and follow prompts after 5 pm and on weekends Office Phone: [phone] Office Fax:  [phone]

## 2016-05-02 ENCOUNTER — Other Ambulatory Visit: Payer: Self-pay | Admitting: *Deleted

## 2016-05-02 DIAGNOSIS — R278 Other lack of coordination: Secondary | ICD-10-CM | POA: Diagnosis not present

## 2016-05-02 DIAGNOSIS — M6281 Muscle weakness (generalized): Secondary | ICD-10-CM | POA: Diagnosis not present

## 2016-05-02 MED ORDER — CLONAZEPAM 0.5 MG PO TABS: ORAL_TABLET | ORAL | 0 refills | Status: DC

## 2016-05-02 NOTE — Telephone Encounter (Signed)
[facility] [phone] Fax: [phone]  

## 2016-05-03 DIAGNOSIS — M6281 Muscle weakness (generalized): Secondary | ICD-10-CM | POA: Diagnosis not present

## 2016-05-03 DIAGNOSIS — R278 Other lack of coordination: Secondary | ICD-10-CM | POA: Diagnosis not present

## 2016-05-04 DIAGNOSIS — R278 Other lack of coordination: Secondary | ICD-10-CM | POA: Diagnosis not present

## 2016-05-04 DIAGNOSIS — M6281 Muscle weakness (generalized): Secondary | ICD-10-CM | POA: Diagnosis not present

## 2016-05-05 DIAGNOSIS — R278 Other lack of coordination: Secondary | ICD-10-CM | POA: Diagnosis not present

## 2016-05-05 DIAGNOSIS — M6281 Muscle weakness (generalized): Secondary | ICD-10-CM | POA: Diagnosis not present

## 2016-05-18 DIAGNOSIS — M6281 Muscle weakness (generalized): Secondary | ICD-10-CM | POA: Diagnosis not present

## 2016-05-18 DIAGNOSIS — R278 Other lack of coordination: Secondary | ICD-10-CM | POA: Diagnosis not present

## 2016-05-18 DIAGNOSIS — M1990 Unspecified osteoarthritis, unspecified site: Secondary | ICD-10-CM | POA: Diagnosis not present

## 2016-05-20 DIAGNOSIS — R278 Other lack of coordination: Secondary | ICD-10-CM | POA: Diagnosis not present

## 2016-05-20 DIAGNOSIS — M6281 Muscle weakness (generalized): Secondary | ICD-10-CM | POA: Diagnosis not present

## 2016-05-20 DIAGNOSIS — M1990 Unspecified osteoarthritis, unspecified site: Secondary | ICD-10-CM | POA: Diagnosis not present

## 2016-05-22 DIAGNOSIS — M6281 Muscle weakness (generalized): Secondary | ICD-10-CM | POA: Diagnosis not present

## 2016-05-22 DIAGNOSIS — R278 Other lack of coordination: Secondary | ICD-10-CM | POA: Diagnosis not present

## 2016-05-22 DIAGNOSIS — M1990 Unspecified osteoarthritis, unspecified site: Secondary | ICD-10-CM | POA: Diagnosis not present

## 2016-05-30 ENCOUNTER — Non-Acute Institutional Stay (SKILLED_NURSING_FACILITY): Payer: Medicare Other | Admitting: Family

## 2016-05-30 ENCOUNTER — Encounter: Payer: Self-pay | Admitting: Family

## 2016-05-30 DIAGNOSIS — I110 Hypertensive heart disease with heart failure: Secondary | ICD-10-CM

## 2016-05-30 DIAGNOSIS — J449 Chronic obstructive pulmonary disease, unspecified: Secondary | ICD-10-CM

## 2016-05-30 DIAGNOSIS — Z794 Long term (current) use of insulin: Secondary | ICD-10-CM

## 2016-05-30 DIAGNOSIS — I509 Heart failure, unspecified: Secondary | ICD-10-CM

## 2016-05-30 DIAGNOSIS — E1140 Type 2 diabetes mellitus with diabetic neuropathy, unspecified: Secondary | ICD-10-CM | POA: Diagnosis not present

## 2016-05-30 DIAGNOSIS — IMO0002 Reserved for concepts with insufficient information to code with codable children: Secondary | ICD-10-CM

## 2016-05-30 DIAGNOSIS — E1165 Type 2 diabetes mellitus with hyperglycemia: Secondary | ICD-10-CM

## 2016-05-30 NOTE — Progress Notes (Signed)
Location:  [place] Room Number: [place]:  SNF (31) Provider:  Dinah Ngetich,FNP-C   Blanchie Serve, MD  Patient Care Team: Blanchie Serve, MD as PCP - General (Internal Medicine) Sandrea Hughs, NP as Nurse Practitioner (Family Medicine)  Extended Emergency Contact Information Primary Emergency Contact: Roblstow,Beth Address: [address]          [place], [place] [address] Johnnette Litter of [place] Phone: [phone] Work Phone: [phone] Mobile Phone: [phone] Relation: Daughter Secondary Emergency Contact: Knapke,Scott Address: [address]          [place], [place] [address] Johnnette Litter of [place] Phone: [phone] Mobile Phone: [phone] Relation: Spouse  Code Status:  Full Code Goals of care: Advanced Directive information Advanced Directives 05/30/2016  Does patient have an advance directive? Yes  Type of Advance Directive (No Data)  Does patient want to make changes to advanced directive? No - Patient declined  Copy of advanced directive(s) in chart? Yes     Chief Complaint  Patient presents with  . Medical Management of Chronic Issues    Medical Management of Chronic issues    HPI:  Pt is a 65 y.o. female seen today at [facility] for medical management of chronic diseases. She has a ha medical history of HTN, CHF, CAD,Stroke with left hemiplegia,COPD,Obesity, Depression among other conditions. She is seen in her room today. She denies any acute issues this visit. She has had no recent weight changes, fall episodes or hospital admission since prior visit. Facility Nurse reports no new concerns.       Past Medical History:  Diagnosis Date  . Anemia   . CAD (coronary artery disease)   . Chronic pain   . COPD (chronic obstructive pulmonary disease) ([place])   . Depression   . Diabetes mellitus without complication ([place])   . Fall   . Hemiplegia affecting  non-dominant side, post-stroke ([place])   . Hyperlipidemia   . Hypertension   . Stroke ([place]) 12/2013   left side paralysis  . Thyroid disease   . Urine retention    Past Surgical History:  Procedure Laterality Date  . ABDOMINAL HYSTERECTOMY    . APPENDECTOMY    . CHOLECYSTECTOMY    . JOINT REPLACEMENT     bil hip  . TONSILLECTOMY      Allergies  Allergen Reactions  . Onion Shortness Of Breath and Swelling  . Pollen Extract Itching  . Morphine And Related Hives    PT DENIES ALLERGY  . Niaspan [Niacin Er] Other (See Comments)    unknown  . Other     onions  . Trazodone And Nefazodone Other (See Comments)    Hallucinations  . Valium [Diazepam] Other (See Comments)    unknown  . Wellbutrin [Bupropion] Other (See Comments)    unknown      Medication List       Accurate as of 05/30/16  4:34 PM. Always use your most recent med list.          aspirin 81 MG chewable tablet Chew 81 mg by mouth daily.   baclofen 10 MG tablet Commonly known as:  LIORESAL Take 10 mg by mouth 3 (three) times daily. For muscle spasms   CAPSAICIN-MENTHOL EX Apply  0.025% cream topically to feet twice daily for pain.   clonazePAM 0.5 MG tablet Commonly known as:  KLONOPIN Take one tablet by mouth every 8 hours as needed  for anxiety   cyclobenzaprine 5 MG tablet Commonly known as:  FLEXERIL Take 5 mg by mouth 2 (two) times daily.   docusate sodium 100 MG capsule Commonly known as:  COLACE Take 2 capsules (200 mg total) by mouth 2 (two) times daily.   fluticasone 50 MCG/ACT nasal spray Commonly known as:  FLONASE Place 2 sprays into both nostrils daily. For chronic sinus infections   furosemide 20 MG tablet Commonly known as:  LASIX Take 20 mg by mouth daily.   gabapentin 400 MG capsule Commonly known as:  NEURONTIN Take 1 (400 mg) capsule by mouth three times a day with meals. Also take 3 (1200mg ) capsules by mouth daily at bedtime.   glimepiride 2 MG tablet Commonly known as:   AMARYL Take 2 mg by mouth daily with breakfast.   hydrOXYzine 25 MG tablet Commonly known as:  ATARAX/VISTARIL Take 25 mg by mouth every 8 (eight) hours as needed for itching.   insulin lispro 100 UNIT/ML injection Commonly known as:  HUMALOG Inject 36 Units into the skin 3 (three) times daily before meals. For CBG >/= 150 inject 10 additional units subcutaneously   isosorbide mononitrate 30 MG 24 hr tablet Commonly known as:  IMDUR Take 30 mg by mouth daily.   levothyroxine 25 MCG tablet Commonly known as:  SYNTHROID, LEVOTHROID Take 25 mcg by mouth daily before breakfast.   linaclotide 145 MCG Caps capsule Commonly known as:  LINZESS Take 145 mcg by mouth daily. Give 30 minutes prior to first meal and taken on an empty stomach.   linagliptin 5 MG Tabs tablet Commonly known as:  TRADJENTA Take 5 mg by mouth daily.   MELATONIN PO Take 6 mg by mouth at bedtime.   metFORMIN 1000 MG tablet Commonly known as:  GLUCOPHAGE Take 1,000 mg by mouth 2 (two) times daily with a meal.   nitroGLYCERIN 0.4 MG SL tablet Commonly known as:  NITROSTAT Place 0.4 mg under the tongue every 5 (five) minutes as needed for chest pain. Notify MD if not relieved by NTG   omeprazole 20 MG capsule Commonly known as:  PRILOSEC Take 20 mg by mouth daily.   oxyCODONE-acetaminophen 10-325 MG tablet Commonly known as:  PERCOCET Take one tablet by mouth four times daily for chronic pain management. Do not exceed 4 gm of Tylenol in 24 hours   OXYGEN Inhale 2 L into the lungs as needed.   promethazine 12.5 MG tablet Commonly known as:  PHENERGAN Take 12.5 mg by mouth every 6 (six) hours as needed for nausea or vomiting.   rosuvastatin 40 MG tablet Commonly known as:  CRESTOR Take 40 mg by mouth at bedtime. Use brand name for insurance   sertraline 100 MG tablet Commonly known as:  ZOLOFT Take 100 mg by mouth daily. For depression   sertraline 25 MG tablet Commonly known as:  ZOLOFT Take  25 mg by mouth daily. Along with 100mg  to =125mg    simethicone 80 MG chewable tablet Commonly known as:  MYLICON Chew 0000000 mg by mouth 3 (three) times daily.   spironolactone 25 MG tablet Commonly known as:  ALDACTONE Take 25 mg by mouth 2 (two) times daily. For edema   SYSTANE ULTRA OP Place 1 drop into both eyes at bedtime as needed. Wait 3 to 5 minutes between 2 eye meds   tiotropium 18 MCG inhalation capsule Commonly known as:  SPIRIVA Inhale 1 capsule (18 MCG CP Handihaler)  contents by taking two separate inhalations via  handihaler device daily for COPD   TOUJEO SOLOSTAR 300 UNIT/ML Sopn Generic drug:  Insulin Glargine Inject 127 Units into the skin at bedtime.   VENTOLIN HFA 108 (90 Base) MCG/ACT inhaler Generic drug:  albuterol Inhale 2 puffs into the lungs every 8 (eight) hours as needed for wheezing or shortness of breath.   zinc oxide 11.3 % Crea cream Commonly known as:  BALMEX Apply cream topically to buttocks and peri-anal as needed for redness       Review of Systems  Constitutional: Negative for activity change, appetite change, chills, fatigue and fever.  HENT: Negative for congestion, rhinorrhea, sinus pressure, sneezing and sore throat.   Eyes: Negative.   Respiratory: Negative for cough, chest tightness, shortness of breath and wheezing.   Cardiovascular: Negative for chest pain, palpitations and leg swelling.  Gastrointestinal: Negative for abdominal distention, abdominal pain, constipation, nausea and vomiting.  Endocrine: Negative for cold intolerance, heat intolerance, polydipsia, polyphagia and polyuria.  Genitourinary: Negative for dysuria, flank pain and urgency.       Incontinent for both bowel and bladder  Musculoskeletal: Positive for gait problem.       Foot pain   Skin: Negative for color change, pallor and rash.  Neurological: Negative for dizziness, seizures, syncope, light-headedness and headaches.  Hematological: Does not bruise/bleed  easily.  Psychiatric/Behavioral: Negative for agitation, confusion, hallucinations and sleep disturbance. The patient is not nervous/anxious.     Immunization History  Administered Date(s) Administered  . Influenza-Unspecified 06/19/2014, 06/22/2015  . PPD Test 12/26/2013, 01/12/2014, 05/18/2015  . Pneumococcal-Unspecified 07/02/2014   Pertinent  Health Maintenance Due  Topic Date Due  . OPHTHALMOLOGY EXAM  09/11/2016 (Originally 09/01/2015)  . INFLUENZA VACCINE  09/11/2017 (Originally 04/11/2016)  . PNA vac Low Risk Adult (1 of 2 - PCV13) 09/11/2017 (Originally 01/09/2016)  . HEMOGLOBIN A1C  09/13/2016  . FOOT EXAM  09/14/2016  . URINE MICROALBUMIN  12/02/2016  . MAMMOGRAM  12/01/2017  . PAP SMEAR  12/02/2018  . COLONOSCOPY  02/07/2024  . DEXA SCAN  Completed    Vitals:   05/30/16 1619  BP: 112/60  Pulse: 64  Resp: 18  Temp: (!) 95.9 F (35.5 C)  TempSrc: Oral  SpO2: 98%  Weight: 202 lb 12.8 oz (92 kg)  Height: 5\' 3"  (1.6 m)   Body mass index is 35.92 kg/m. Physical Exam  Constitutional: She is oriented to person, place, and time. She appears well-developed and well-nourished. No distress.  HENT:  Head: Normocephalic.  Right Ear: External ear normal.  Left Ear: External ear normal.  Mouth/Throat: Oropharynx is clear and moist.  Eyes: Conjunctivae and EOM are normal. Pupils are equal, round, and reactive to light. Right eye exhibits no discharge. Left eye exhibits no discharge. No scleral icterus.  Neck: Normal range of motion. No JVD present. No thyromegaly present.  Cardiovascular: Normal rate, regular rhythm, normal heart sounds and intact distal pulses.  Exam reveals no gallop and no friction rub.   No murmur heard. Pulmonary/Chest: Effort normal and breath sounds normal. No respiratory distress. She has no wheezes. She has no rales.  Abdominal: Soft. Bowel sounds are normal. She exhibits no distension and no mass. There is no rebound and no guarding.    Musculoskeletal: She exhibits no edema or tenderness.  Bilateral foot drop. Hemiparalysis   Lymphadenopathy:    She has no cervical adenopathy.  Neurological: She is oriented to person, place, and time.  Skin: Skin is warm and dry. No rash noted. No erythema. No  pallor.  Psychiatric: She has a normal mood and affect.    Labs reviewed:  Recent Labs  09/02/15 12/13/15  NA 139 142  K 4.2 4.3  BUN 19 11  CREATININE 0.7 0.7    Recent Labs  09/02/15  AST 22  ALT 16  ALKPHOS 113    Recent Labs  09/02/15 12/13/15  WBC 7.5 10.7  HGB 11.5* 10.4*  HCT 38 35*  PLT 270 246   Lab Results  Component Value Date   TSH 1.98 12/31/2015   Lab Results  Component Value Date   HGBA1C 9.3 03/13/2016   Lab Results  Component Value Date   CHOL 209 (A) 01/04/2016   HDL 24 (A) 01/04/2016   LDLCALC 134 01/04/2016   TRIG 258 (A) 01/04/2016   Assessment/Plan HTN B/p at goal for DM. Continue on imdur and Aldactone. Monitor BMP   CHF Stable.Exam finding negative. Continue on imdur, Furosemide and Aldactone. Monitor weight.   COPD Breathing stable. Continue on Albuterol PRN and Spiriva. Continue to monitor.   Type 2 DM CBG's stable. Continue on Amaryl, Toujeo, tradjenta and Humalog. Monitor Hgb A1C. She is upto date on eye and foot exam. On statin and ASA.   Family/ staff Communication: Reviewed plan of care with patient and facility nurse supervisor.   Labs/tests ordered: None

## 2016-06-01 ENCOUNTER — Other Ambulatory Visit: Payer: Self-pay

## 2016-06-01 MED ORDER — OXYCODONE-ACETAMINOPHEN 10-325 MG PO TABS: ORAL_TABLET | ORAL | 0 refills | Status: DC

## 2016-06-01 NOTE — Telephone Encounter (Signed)
Rx faxed to [facility] @ [phone], phone number [phone]  

## 2016-06-08 DIAGNOSIS — M79674 Pain in right toe(s): Secondary | ICD-10-CM | POA: Diagnosis not present

## 2016-06-08 DIAGNOSIS — B351 Tinea unguium: Secondary | ICD-10-CM | POA: Diagnosis not present

## 2016-06-08 DIAGNOSIS — E1142 Type 2 diabetes mellitus with diabetic polyneuropathy: Secondary | ICD-10-CM | POA: Diagnosis not present

## 2016-06-08 DIAGNOSIS — I70203 Unspecified atherosclerosis of native arteries of extremities, bilateral legs: Secondary | ICD-10-CM | POA: Diagnosis not present

## 2016-06-13 DIAGNOSIS — E1165 Type 2 diabetes mellitus with hyperglycemia: Secondary | ICD-10-CM | POA: Diagnosis not present

## 2016-06-13 LAB — HEMOGLOBIN A1C: Hemoglobin A1C: 9.3

## 2016-06-15 ENCOUNTER — Encounter: Payer: Self-pay | Admitting: Family

## 2016-06-15 ENCOUNTER — Non-Acute Institutional Stay (SKILLED_NURSING_FACILITY): Payer: Medicare Other | Admitting: Family

## 2016-06-15 DIAGNOSIS — E1165 Type 2 diabetes mellitus with hyperglycemia: Secondary | ICD-10-CM

## 2016-06-15 DIAGNOSIS — E1140 Type 2 diabetes mellitus with diabetic neuropathy, unspecified: Secondary | ICD-10-CM

## 2016-06-15 DIAGNOSIS — Z794 Long term (current) use of insulin: Secondary | ICD-10-CM | POA: Diagnosis not present

## 2016-06-15 DIAGNOSIS — G4700 Insomnia, unspecified: Secondary | ICD-10-CM | POA: Diagnosis not present

## 2016-06-15 DIAGNOSIS — IMO0002 Reserved for concepts with insufficient information to code with codable children: Secondary | ICD-10-CM

## 2016-06-15 NOTE — Progress Notes (Signed)
Location:  [facility] Room Number: [place] of Service:  SNF (31) Provider:  Giulio Bertino FNP-C   Blanchie Serve, MD  Patient Care Team: Blanchie Serve, MD as PCP - General (Internal Medicine) Sandrea Hughs, NP as Nurse Practitioner (Family Medicine)  Extended Emergency Contact Information Primary Emergency Contact: Roblstow,Beth Address: [address]          [place], [place] [address] Johnnette Litter of [place] Phone: [phone] Work Phone: [phone] Mobile Phone: [phone] Relation: Daughter Secondary Emergency Contact: Radigan,Scott Address: [address]          [place], [place] [address] Johnnette Litter of [place] Phone: [phone] Mobile Phone: [phone] Relation: Spouse  Code Status:  Full Code  Goals of care: Advanced Directive information Advanced Directives 06/15/2016  Does patient have an advance directive? No  Type of Advance Directive -  Does patient want to make changes to advanced directive? -  Copy of advanced directive(s) in chart? -     Chief Complaint  Patient presents with  . Acute Visit    Abnormal Labs    HPI:  Pt is a 65 y.o. female seen today at [facility] for an acute visit for evaluation of abnormal labs. She has a significant medical history of Type 2 DM, HTN, Obesity among other conditions. She is seen in her room today. Her recent Hgb A1C was 9.3 ( 06/13/2016). She complains of inability to sleep at night states MD discontinued her Remeron. Remeron  Wean off due to progressive weight gain with Hyperglycemia. CBG's have improved since Remeron was D/C. Facility staff states patient continues to drink orange juice and Peps[employer].     Past Medical History:  Diagnosis Date  . Anemia   . CAD (coronary artery disease)   . Chronic pain   . COPD (chronic obstructive pulmonary disease) ([place])   . Depression   . Diabetes mellitus without complication ([place])   . Fall   .  Hemiplegia affecting non-dominant side, post-stroke ([place])   . Hyperlipidemia   . Hypertension   . Stroke ([place]) 12/2013   left side paralysis  . Thyroid disease   . Urine retention    Past Surgical History:  Procedure Laterality Date  . ABDOMINAL HYSTERECTOMY    . APPENDECTOMY    . CHOLECYSTECTOMY    . JOINT REPLACEMENT     bil hip  . TONSILLECTOMY      Allergies  Allergen Reactions  . Onion Shortness Of Breath and Swelling  . Pollen Extract Itching  . Morphine And Related Hives    PT DENIES ALLERGY  . Niaspan [Niacin Er] Other (See Comments)    unknown  . Other     onions  . Trazodone And Nefazodone Other (See Comments)    Hallucinations  . Valium [Diazepam] Other (See Comments)    unknown  . Wellbutrin [Bupropion] Other (See Comments)    unknown      Medication List       Accurate as of 06/15/16  1:15 PM. Always use your most recent med list.          aspirin 81 MG chewable tablet Chew 81 mg by mouth daily.   baclofen 10 MG tablet Commonly known as:  LIORESAL Take 10 mg by mouth 3 (three) times daily. For muscle spasms   CAPSAICIN-MENTHOL EX Apply  0.025% cream topically to feet twice daily for pain.   clonazePAM 0.5 MG tablet Commonly  known as:  KLONOPIN Take one tablet by mouth every 8 hours as needed for anxiety   cyclobenzaprine 5 MG tablet Commonly known as:  FLEXERIL Take 5 mg by mouth 2 (two) times daily.   docusate sodium 100 MG capsule Commonly known as:  COLACE Take 2 capsules (200 mg total) by mouth 2 (two) times daily.   fluticasone 50 MCG/ACT nasal spray Commonly known as:  FLONASE Place 2 sprays into both nostrils daily. For chronic sinus infections   furosemide 20 MG tablet Commonly known as:  LASIX Take 20 mg by mouth daily.   gabapentin 400 MG capsule Commonly known as:  NEURONTIN Take 1 (400 mg) capsule by mouth three times a day with meals. Also take 3 (1200mg ) capsules by mouth daily at bedtime.   glimepiride 2 MG  tablet Commonly known as:  AMARYL Take 2 mg by mouth daily with breakfast.   hydrOXYzine 25 MG tablet Commonly known as:  ATARAX/VISTARIL Take 25 mg by mouth every 8 (eight) hours as needed for itching.   insulin lispro 100 UNIT/ML injection Commonly known as:  HUMALOG Inject 36 Units into the skin 3 (three) times daily before meals. For CBG >/= 150 inject 10 additional units subcutaneously   isosorbide mononitrate 30 MG 24 hr tablet Commonly known as:  IMDUR Take 30 mg by mouth daily.   levothyroxine 25 MCG tablet Commonly known as:  SYNTHROID, LEVOTHROID Take 25 mcg by mouth daily before breakfast.   linaclotide 145 MCG Caps capsule Commonly known as:  LINZESS Take 145 mcg by mouth daily. Give 30 minutes prior to first meal and taken on an empty stomach.   linagliptin 5 MG Tabs tablet Commonly known as:  TRADJENTA Take 5 mg by mouth daily.   MELATONIN PO Take 6 mg by mouth at bedtime.   metFORMIN 1000 MG tablet Commonly known as:  GLUCOPHAGE Take 1,000 mg by mouth 2 (two) times daily with a meal.   nitroGLYCERIN 0.4 MG SL tablet Commonly known as:  NITROSTAT Place 0.4 mg under the tongue every 5 (five) minutes as needed for chest pain. Notify MD if not relieved by NTG   omeprazole 20 MG capsule Commonly known as:  PRILOSEC Take 20 mg by mouth daily.   oxyCODONE-acetaminophen 10-325 MG tablet Commonly known as:  PERCOCET Take one tablet by mouth every 6 hours for chronic pain management. Do not exceed 4 gm of Tylenol in 24 hours   OXYGEN Inhale 2 L into the lungs as needed.   promethazine 12.5 MG tablet Commonly known as:  PHENERGAN Take 12.5 mg by mouth every 6 (six) hours as needed for nausea or vomiting.   rosuvastatin 40 MG tablet Commonly known as:  CRESTOR Take 40 mg by mouth at bedtime. Use brand name for insurance   sertraline 100 MG tablet Commonly known as:  ZOLOFT Take 100 mg by mouth daily. For depression   sertraline 25 MG tablet Commonly  known as:  ZOLOFT Take 25 mg by mouth daily. Along with 100mg  to =125mg    simethicone 80 MG chewable tablet Commonly known as:  MYLICON Chew 0000000 mg by mouth 3 (three) times daily.   spironolactone 25 MG tablet Commonly known as:  ALDACTONE Take 25 mg by mouth 2 (two) times daily. For edema   SYSTANE ULTRA OP Place 1 drop into both eyes at bedtime as needed. Wait 3 to 5 minutes between 2 eye meds   tiotropium 18 MCG inhalation capsule Commonly known as:  SPIRIVA Inhale  1 capsule (18 MCG CP Handihaler)  contents by taking two separate inhalations via handihaler device daily for COPD   TOUJEO SOLOSTAR 300 UNIT/ML Sopn Generic drug:  Insulin Glargine Inject 127 Units into the skin at bedtime.   VENTOLIN HFA 108 (90 Base) MCG/ACT inhaler Generic drug:  albuterol Inhale 2 puffs into the lungs every 8 (eight) hours as needed for wheezing or shortness of breath.   zinc oxide 11.3 % Crea cream Commonly known as:  BALMEX Apply cream topically to buttocks and peri-anal as needed for redness       Review of Systems  Constitutional: Negative for activity change, appetite change, chills, fatigue and fever.  HENT: Negative for congestion, rhinorrhea, sinus pressure, sneezing and sore throat.   Eyes: Negative.   Respiratory: Negative for cough, chest tightness, shortness of breath and wheezing.   Cardiovascular: Negative for chest pain, palpitations and leg swelling.  Gastrointestinal: Negative for abdominal distention, abdominal pain, constipation, nausea and vomiting.  Endocrine: Negative for polydipsia, polyphagia and polyuria.  Genitourinary: Negative for dysuria, flank pain and urgency.       Incontinent   Musculoskeletal: Positive for gait problem.       Foot pain   Skin: Negative for color change, pallor and rash.  Neurological: Negative for dizziness, seizures, syncope, light-headedness and headaches.  Psychiatric/Behavioral: Negative.  Negative for agitation, confusion,  hallucinations and sleep disturbance. The patient is not nervous/anxious.     Immunization History  Administered Date(s) Administered  . Influenza-Unspecified 06/19/2014, 06/22/2015  . PPD Test 12/26/2013, 01/12/2014, 05/18/2015  . Pneumococcal-Unspecified 07/02/2014   Pertinent  Health Maintenance Due  Topic Date Due  . OPHTHALMOLOGY EXAM  09/11/2016 (Originally 09/01/2015)  . INFLUENZA VACCINE  09/11/2017 (Originally 04/11/2016)  . PNA vac Low Risk Adult (1 of 2 - PCV13) 09/11/2017 (Originally 01/09/2016)  . HEMOGLOBIN A1C  09/13/2016  . FOOT EXAM  09/14/2016  . URINE MICROALBUMIN  12/02/2016  . MAMMOGRAM  12/01/2017  . PAP SMEAR  12/02/2018  . COLONOSCOPY  02/07/2024  . DEXA SCAN  Completed      Vitals:   06/15/16 1254  BP: 140/69  Pulse: 100  Resp: 20  Temp: 97.9 F (36.6 C)  Weight: 202 lb (91.6 kg)  Height: 5\' 3"  (1.6 m)   Body mass index is 35.78 kg/m. Physical Exam  Constitutional: She is oriented to person, place, and time. She appears well-developed and well-nourished. No distress.  HENT:  Head: Normocephalic.  Right Ear: External ear normal.  Left Ear: External ear normal.  Mouth/Throat: Oropharynx is clear and moist.  Eyes: Conjunctivae and EOM are normal. Pupils are equal, round, and reactive to light. Right eye exhibits no discharge. Left eye exhibits no discharge. No scleral icterus.  Neck: Normal range of motion. No JVD present. No thyromegaly present.  Cardiovascular: Normal rate, regular rhythm, normal heart sounds and intact distal pulses.  Exam reveals no gallop and no friction rub.   No murmur heard. Pulmonary/Chest: Effort normal and breath sounds normal. No respiratory distress. She has no wheezes. She has no rales.  Abdominal: Soft. Bowel sounds are normal. She exhibits no distension and no mass. There is no rebound and no guarding.  Musculoskeletal: She exhibits no edema or tenderness.  Bilateral foot drop. Hemiparalysis   Lymphadenopathy:     She has no cervical adenopathy.  Neurological: She is oriented to person, place, and time.  Skin: Skin is warm and dry. No rash noted. No erythema. No pallor.  Skin intact  Psychiatric: She has a normal mood and affect.    Labs reviewed:  Recent Labs  09/02/15 12/13/15  NA 139 142  K 4.2 4.3  BUN 19 11  CREATININE 0.7 0.7    Recent Labs  09/02/15  AST 22  ALT 16  ALKPHOS 113    Recent Labs  09/02/15 12/13/15  WBC 7.5 10.7  HGB 11.5* 10.4*  HCT 38 35*  PLT 270 246   Lab Results  Component Value Date   TSH 1.98 12/31/2015   Lab Results  Component Value Date   HGBA1C 9.3 06/13/2016   Lab Results  Component Value Date   CHOL 209 (A) 01/04/2016   HDL 24 (A) 01/04/2016   LDLCALC 134 01/04/2016   TRIG 258 (A) 01/04/2016   Assessment/Plan Type 2 DM  CBG's ranging in the 190's-200's in AM and 110's-200's in PM. Recent Hgb A1C 9.3. Increase Tuojeo to 128 units SQ at bedtime. Continue to monitor Hgb A1C. Insomnia  Continue on Melatonin    Family/ staff Communication: Reviewed plan with patient and facility Nurse supervisor.   Labs/tests ordered: Hgb A1C in 3 months.

## 2016-06-26 ENCOUNTER — Non-Acute Institutional Stay (SKILLED_NURSING_FACILITY): Payer: Medicare Other | Admitting: Family

## 2016-06-26 DIAGNOSIS — J449 Chronic obstructive pulmonary disease, unspecified: Secondary | ICD-10-CM | POA: Diagnosis not present

## 2016-06-26 DIAGNOSIS — E1140 Type 2 diabetes mellitus with diabetic neuropathy, unspecified: Secondary | ICD-10-CM | POA: Diagnosis not present

## 2016-06-26 DIAGNOSIS — I110 Hypertensive heart disease with heart failure: Secondary | ICD-10-CM

## 2016-06-26 DIAGNOSIS — E039 Hypothyroidism, unspecified: Secondary | ICD-10-CM

## 2016-06-26 DIAGNOSIS — F5101 Primary insomnia: Secondary | ICD-10-CM | POA: Diagnosis not present

## 2016-06-26 DIAGNOSIS — E1165 Type 2 diabetes mellitus with hyperglycemia: Secondary | ICD-10-CM | POA: Diagnosis not present

## 2016-06-26 DIAGNOSIS — D509 Iron deficiency anemia, unspecified: Secondary | ICD-10-CM | POA: Diagnosis not present

## 2016-06-26 DIAGNOSIS — Z794 Long term (current) use of insulin: Secondary | ICD-10-CM | POA: Diagnosis not present

## 2016-06-26 DIAGNOSIS — IMO0002 Reserved for concepts with insufficient information to code with codable children: Secondary | ICD-10-CM

## 2016-06-26 DIAGNOSIS — E785 Hyperlipidemia, unspecified: Secondary | ICD-10-CM

## 2016-06-26 NOTE — Progress Notes (Signed)
Location:  [place] Room Number: [place] of Service:  SNF (31) Provider:  Ala Kratz FNP-C   Blanchie Serve, MD  Patient Care Team: Blanchie Serve, MD as PCP - General (Internal Medicine) Sandrea Hughs, NP as Nurse Practitioner (Family Medicine)  Extended Emergency Contact Information Primary Emergency Contact: Roblstow,Beth Address: [address]          [place], [place] [address] Johnnette Litter of [place] Phone: [phone] Work Phone: [phone] Mobile Phone: [phone] Relation: Daughter Secondary Emergency Contact: Siemon,Scott Address: [address]          [place], [place] [address] Johnnette Litter of [place] Phone: [phone] Mobile Phone: [phone] Relation: Spouse  Code Status:  Full code  Goals of care: Advanced Directive information Advanced Directives 06/15/2016  Does patient have an advance directive? No  Type of Advance Directive -  Does patient want to make changes to advanced directive? -  Copy of advanced directive(s) in chart? -     Chief Complaint  Patient presents with  . Medical Management of Chronic Issues    HPI:  Pt is a 65 y.o. female seen today at [facility]  for medical management of chronic diseases. She has a medical history of HTN, COPD, Hypothyroidism, Type 2 DM among other conditions. She is seen in room today.Her CBG's ranging 130's-200's much improvement from previous visit. No recent fall episodes or hospital admission reported. She continues to complain of inability to sleep at night requesting her Remeron to be schedule. Remeron previous d/ced by MD due to weight gain and hyperglycemia. This was discussed with patient. She verbalized understanding now request for Trazodone but it's listed on her allergy medication. Will continue on Klonopin and Melatonin for now.    Past Medical History:  Diagnosis Date  . Anemia   . CAD (coronary artery disease)     . Chronic pain   . COPD (chronic obstructive pulmonary disease) ([place])   . Depression   . Diabetes mellitus without complication ([place])   . Fall   . Hemiplegia affecting non-dominant side, post-stroke ([place])   . Hyperlipidemia   . Hypertension   . Stroke ([place]) 12/2013   left side paralysis  . Thyroid disease   . Urine retention    Past Surgical History:  Procedure Laterality Date  . ABDOMINAL HYSTERECTOMY    . APPENDECTOMY    . CHOLECYSTECTOMY    . JOINT REPLACEMENT     bil hip  . TONSILLECTOMY      Allergies  Allergen Reactions  . Onion Shortness Of Breath and Swelling  . Pollen Extract Itching  . Morphine And Related Hives    PT DENIES ALLERGY  . Niaspan [Niacin Er] Other (See Comments)    unknown  . Other     onions  . Trazodone And Nefazodone Other (See Comments)    Hallucinations  . Valium [Diazepam] Other (See Comments)    unknown  . Wellbutrin [Bupropion] Other (See Comments)    unknown      Medication List       Accurate as of 06/26/16  8:10 PM. Always use your most recent med list.          aspirin 81 MG chewable tablet Chew 81 mg by mouth daily.   baclofen 10 MG tablet Commonly known as:  LIORESAL Take 10 mg by mouth 3 (three) times daily. For muscle spasms   CAPSAICIN-MENTHOL EX Apply  0.025%  cream topically to feet twice daily for pain.   clonazePAM 0.5 MG tablet Commonly known as:  KLONOPIN Take one tablet by mouth every 8 hours as needed for anxiety   cyclobenzaprine 5 MG tablet Commonly known as:  FLEXERIL Take 5 mg by mouth 2 (two) times daily.   docusate sodium 100 MG capsule Commonly known as:  COLACE Take 2 capsules (200 mg total) by mouth 2 (two) times daily.   fluticasone 50 MCG/ACT nasal spray Commonly known as:  FLONASE Place 2 sprays into both nostrils daily. For chronic sinus infections   furosemide 20 MG tablet Commonly known as:  LASIX Take 20 mg by mouth daily.   gabapentin 400 MG capsule Commonly known as:   NEURONTIN Take 1 (400 mg) capsule by mouth three times a day with meals. Also take 3 (1200mg ) capsules by mouth daily at bedtime.   glimepiride 2 MG tablet Commonly known as:  AMARYL Take 2 mg by mouth daily with breakfast.   hydrOXYzine 25 MG tablet Commonly known as:  ATARAX/VISTARIL Take 25 mg by mouth every 8 (eight) hours as needed for itching.   insulin lispro 100 UNIT/ML injection Commonly known as:  HUMALOG Inject 36 Units into the skin 3 (three) times daily before meals. For CBG >/= 150 inject 10 additional units subcutaneously   isosorbide mononitrate 30 MG 24 hr tablet Commonly known as:  IMDUR Take 30 mg by mouth daily.   levothyroxine 25 MCG tablet Commonly known as:  SYNTHROID, LEVOTHROID Take 25 mcg by mouth daily before breakfast.   linaclotide 145 MCG Caps capsule Commonly known as:  LINZESS Take 145 mcg by mouth daily. Give 30 minutes prior to first meal and taken on an empty stomach.   linagliptin 5 MG Tabs tablet Commonly known as:  TRADJENTA Take 5 mg by mouth daily.   MELATONIN PO Take 6 mg by mouth at bedtime.   metFORMIN 1000 MG tablet Commonly known as:  GLUCOPHAGE Take 1,000 mg by mouth 2 (two) times daily with a meal.   nitroGLYCERIN 0.4 MG SL tablet Commonly known as:  NITROSTAT Place 0.4 mg under the tongue every 5 (five) minutes as needed for chest pain. Notify MD if not relieved by NTG   omeprazole 20 MG capsule Commonly known as:  PRILOSEC Take 20 mg by mouth daily.   oxyCODONE-acetaminophen 10-325 MG tablet Commonly known as:  PERCOCET Take one tablet by mouth every 6 hours for chronic pain management. Do not exceed 4 gm of Tylenol in 24 hours   OXYGEN Inhale 2 L into the lungs as needed.   promethazine 12.5 MG tablet Commonly known as:  PHENERGAN Take 12.5 mg by mouth every 6 (six) hours as needed for nausea or vomiting.   rosuvastatin 40 MG tablet Commonly known as:  CRESTOR Take 40 mg by mouth at bedtime. Use brand name  for insurance   sertraline 100 MG tablet Commonly known as:  ZOLOFT Take 100 mg by mouth daily. For depression   sertraline 25 MG tablet Commonly known as:  ZOLOFT Take 25 mg by mouth daily. Along with 100mg  to =125mg    simethicone 80 MG chewable tablet Commonly known as:  MYLICON Chew 0000000 mg by mouth 3 (three) times daily.   spironolactone 25 MG tablet Commonly known as:  ALDACTONE Take 25 mg by mouth 2 (two) times daily. For edema   SYSTANE ULTRA OP Place 1 drop into both eyes at bedtime as needed. Wait 3 to 5 minutes between 2  eye meds   tiotropium 18 MCG inhalation capsule Commonly known as:  SPIRIVA Inhale 1 capsule (18 MCG CP Handihaler)  contents by taking two separate inhalations via handihaler device daily for COPD   TOUJEO SOLOSTAR 300 UNIT/ML Sopn Generic drug:  Insulin Glargine Inject 128 Units into the skin at bedtime.   VENTOLIN HFA 108 (90 Base) MCG/ACT inhaler Generic drug:  albuterol Inhale 2 puffs into the lungs every 8 (eight) hours as needed for wheezing or shortness of breath.   zinc oxide 11.3 % Crea cream Commonly known as:  BALMEX Apply cream topically to buttocks and peri-anal as needed for redness       Review of Systems  Constitutional: Negative for activity change, appetite change, chills, fatigue and fever.  HENT: Negative for congestion, rhinorrhea, sinus pressure, sneezing and sore throat.   Eyes: Negative.   Respiratory: Negative for cough, chest tightness, shortness of breath and wheezing.   Cardiovascular: Negative for chest pain, palpitations and leg swelling.  Gastrointestinal: Negative for abdominal distention, abdominal pain, constipation, nausea and vomiting.  Endocrine: Negative for polydipsia, polyphagia and polyuria.  Genitourinary: Negative for dysuria, flank pain and urgency.       Incontinent   Musculoskeletal: Positive for gait problem.       Foot pain controlled with current regimen.   Skin: Negative for color change,  pallor and rash.  Neurological: Negative for dizziness, seizures, syncope, light-headedness and headaches.  Psychiatric/Behavioral: Negative.  Negative for agitation, confusion, hallucinations and sleep disturbance. The patient is not nervous/anxious.     Immunization History  Administered Date(s) Administered  . Influenza-Unspecified 06/19/2014, 06/22/2015  . PPD Test 12/26/2013, 01/12/2014, 05/18/2015  . Pneumococcal-Unspecified 07/02/2014   Pertinent  Health Maintenance Due  Topic Date Due  . OPHTHALMOLOGY EXAM  09/11/2016 (Originally 09/01/2015)  . INFLUENZA VACCINE  09/11/2017 (Originally 04/11/2016)  . PNA vac Low Risk Adult (1 of 2 - PCV13) 09/11/2017 (Originally 01/09/2016)  . FOOT EXAM  09/14/2016  . URINE MICROALBUMIN  12/02/2016  . HEMOGLOBIN A1C  12/12/2016  . MAMMOGRAM  12/01/2017  . PAP SMEAR  12/02/2018  . COLONOSCOPY  02/07/2024  . DEXA SCAN  Completed      Vitals:   06/26/16 1200  BP: 124/80  Pulse: 75  Resp: 18  Temp: 97.9 F (36.6 C)  SpO2: 97%  Weight: 204 lb 4.8 oz (92.7 kg)  Height: 5\' 3"  (1.6 m)   Body mass index is 36.19 kg/m. Physical Exam  Constitutional: She is oriented to person, place, and time. She appears well-developed and well-nourished. No distress.  HENT:  Head: Normocephalic.  Right Ear: External ear normal.  Left Ear: External ear normal.  Mouth/Throat: Oropharynx is clear and moist.  Eyes: Conjunctivae and EOM are normal. Pupils are equal, round, and reactive to light. Right eye exhibits no discharge. Left eye exhibits no discharge. No scleral icterus.  Neck: Normal range of motion. No JVD present. No thyromegaly present.  Cardiovascular: Normal rate, regular rhythm, normal heart sounds and intact distal pulses.  Exam reveals no gallop and no friction rub.   No murmur heard. Pulmonary/Chest: Effort normal and breath sounds normal. No respiratory distress. She has no wheezes. She has no rales.  Abdominal: Soft. Bowel sounds are  normal. She exhibits no distension and no mass. There is no rebound and no guarding.  Genitourinary:  Genitourinary Comments: Incontinent for both B/B   Musculoskeletal: She exhibits no edema or tenderness.  Bilateral foot drop. Hemiparalysis   Lymphadenopathy:  She has no cervical adenopathy.  Neurological: She is oriented to person, place, and time.  Skin: Skin is warm and dry. No rash noted. No erythema. No pallor.  Psychiatric: She has a normal mood and affect.    Labs reviewed:  Recent Labs  09/02/15 12/13/15  NA 139 142  K 4.2 4.3  BUN 19 11  CREATININE 0.7 0.7    Recent Labs  09/02/15  AST 22  ALT 16  ALKPHOS 113    Recent Labs  09/02/15 12/13/15  WBC 7.5 10.7  HGB 11.5* 10.4*  HCT 38 35*  PLT 270 246   Lab Results  Component Value Date   TSH 1.98 12/31/2015   Lab Results  Component Value Date   HGBA1C 9.3 06/13/2016   Lab Results  Component Value Date   CHOL 209 (A) 01/04/2016   HDL 24 (A) 01/04/2016   LDLCALC 134 01/04/2016   TRIG 258 (A) 01/04/2016   Assessment/Plan 1. Hypertensive heart disease with heart failure (HCC) B/p stable. Continue imdur. BMP 06/27/2016  2. Chronic obstructive pulmonary disease, unspecified COPD type ([place]) Stable. No cough or wheezing noted. Continue on Albuterol PRN   3. Hypothyroidism, unspecified type Continue on Levothyroxine. TSH level 06/27/2016  4. Uncontrolled type 2 diabetes mellitus with diabetic neuropathy, with long-term current use of insulin ([place]) CBG's have improved since Tuojeo adjusted and Remeron D/ced. Continue on Tuojeo, Amryl, Humalog and Tradjenta.continue to monitor Hgb A1C   5. Primary insomnia Request Remeron to be restarted though had increased weight and hyperglycemia. Will continue on Melatonin and Klonopin for now. Also request Trazodone but med listed as an allergy.Continue to monitor.   6. Dyslipidemia Continue on crestor. Obtain Lipid panel 06/27/2016  7. Anemia  Will obtain  CBC 06/27/2016.     Family/ staff Communication: Reviewed plan of care with patient and facility Nurse supervisor.   Labs/tests ordered:  CBC, BMP, TSH level, Lipid panel  06/27/2016.

## 2016-06-27 DIAGNOSIS — Z79899 Other long term (current) drug therapy: Secondary | ICD-10-CM | POA: Diagnosis not present

## 2016-06-27 DIAGNOSIS — I10 Essential (primary) hypertension: Secondary | ICD-10-CM | POA: Diagnosis not present

## 2016-06-27 LAB — CBC AND DIFFERENTIAL
HEMATOCRIT: 41 % (ref 36–46)
Hemoglobin: 12.5 g/dL (ref 12.0–16.0)
Platelets: 266 10*3/uL (ref 150–399)
WBC: 13.7 10^3/mL

## 2016-06-27 LAB — TSH: TSH: 4.06 u[IU]/mL (ref 0.41–5.90)

## 2016-06-27 LAB — LIPID PANEL
Cholesterol: 196 mg/dL (ref 0–200)
HDL: 26 mg/dL — AB (ref 35–70)
LDL CALC: 119 mg/dL
TRIGLYCERIDES: 255 mg/dL — AB (ref 40–160)

## 2016-06-27 LAB — BASIC METABOLIC PANEL
BUN: 14 mg/dL (ref 4–21)
CREATININE: 0.8 mg/dL (ref 0.5–1.1)
GLUCOSE: 178 mg/dL
POTASSIUM: 3.8 mmol/L (ref 3.4–5.3)
SODIUM: 140 mmol/L (ref 137–147)

## 2016-06-29 DIAGNOSIS — L918 Other hypertrophic disorders of the skin: Secondary | ICD-10-CM | POA: Diagnosis not present

## 2016-06-29 DIAGNOSIS — L853 Xerosis cutis: Secondary | ICD-10-CM | POA: Diagnosis not present

## 2016-06-29 DIAGNOSIS — L820 Inflamed seborrheic keratosis: Secondary | ICD-10-CM | POA: Diagnosis not present

## 2016-06-29 DIAGNOSIS — L308 Other specified dermatitis: Secondary | ICD-10-CM | POA: Diagnosis not present

## 2016-06-29 DIAGNOSIS — L821 Other seborrheic keratosis: Secondary | ICD-10-CM | POA: Diagnosis not present

## 2016-07-03 ENCOUNTER — Other Ambulatory Visit: Payer: Self-pay | Admitting: *Deleted

## 2016-07-03 MED ORDER — OXYCODONE-ACETAMINOPHEN 10-325 MG PO TABS: ORAL_TABLET | ORAL | 0 refills | Status: DC

## 2016-07-03 NOTE — Telephone Encounter (Signed)
[facility] [phone] Fax: [phone]  

## 2016-07-07 DIAGNOSIS — N390 Urinary tract infection, site not specified: Secondary | ICD-10-CM | POA: Diagnosis not present

## 2016-07-17 ENCOUNTER — Other Ambulatory Visit: Payer: Self-pay | Admitting: *Deleted

## 2016-07-17 MED ORDER — CLONAZEPAM 0.5 MG PO TABS: ORAL_TABLET | ORAL | 0 refills | Status: DC

## 2016-07-17 NOTE — Telephone Encounter (Signed)
[facility] [phone] Fax: [phone]  

## 2016-08-01 ENCOUNTER — Other Ambulatory Visit: Payer: Self-pay | Admitting: *Deleted

## 2016-08-01 ENCOUNTER — Encounter: Payer: Self-pay | Admitting: Family

## 2016-08-01 ENCOUNTER — Non-Acute Institutional Stay (SKILLED_NURSING_FACILITY): Payer: Medicare Other | Admitting: Family

## 2016-08-01 DIAGNOSIS — E039 Hypothyroidism, unspecified: Secondary | ICD-10-CM | POA: Diagnosis not present

## 2016-08-01 DIAGNOSIS — G4700 Insomnia, unspecified: Secondary | ICD-10-CM | POA: Diagnosis not present

## 2016-08-01 DIAGNOSIS — H9193 Unspecified hearing loss, bilateral: Secondary | ICD-10-CM | POA: Diagnosis not present

## 2016-08-01 DIAGNOSIS — M792 Neuralgia and neuritis, unspecified: Secondary | ICD-10-CM | POA: Diagnosis not present

## 2016-08-01 DIAGNOSIS — E1140 Type 2 diabetes mellitus with diabetic neuropathy, unspecified: Secondary | ICD-10-CM

## 2016-08-01 DIAGNOSIS — E1165 Type 2 diabetes mellitus with hyperglycemia: Secondary | ICD-10-CM

## 2016-08-01 DIAGNOSIS — E785 Hyperlipidemia, unspecified: Secondary | ICD-10-CM | POA: Diagnosis not present

## 2016-08-01 DIAGNOSIS — I110 Hypertensive heart disease with heart failure: Secondary | ICD-10-CM | POA: Diagnosis not present

## 2016-08-01 DIAGNOSIS — IMO0002 Reserved for concepts with insufficient information to code with codable children: Secondary | ICD-10-CM

## 2016-08-01 DIAGNOSIS — Z794 Long term (current) use of insulin: Secondary | ICD-10-CM

## 2016-08-01 DIAGNOSIS — F329 Major depressive disorder, single episode, unspecified: Secondary | ICD-10-CM

## 2016-08-01 MED ORDER — OXYCODONE-ACETAMINOPHEN 10-325 MG PO TABS: ORAL_TABLET | ORAL | 0 refills | Status: DC

## 2016-08-01 NOTE — Progress Notes (Signed)
Location:  [facility] Room Number: 1001A Place of Service:  SNF (31) Provider:  Marlowe Sax, FNP-C  Blanchie Serve, MD  Patient Care Team: Blanchie Serve, MD as PCP - General (Internal Medicine) Sandrea Hughs, NP as Nurse Practitioner (Family Medicine)  Extended Emergency Contact Information Primary Emergency Contact: Roblstow,Beth Address: [address]          [place], [place] [address] Johnnette Litter of [place] Phone: [phone] Work Phone: [phone] Mobile Phone: [phone] Relation: Daughter Secondary Emergency Contact: Hollen,Scott Address: [address]          [place], [place] [address] Johnnette Litter of [place] Phone: [phone] Mobile Phone: [phone] Relation: Spouse  Code Status: Full Goals of care: Advanced Directive information Advanced Directives 08/01/2016  Does Patient Have a Medical Advance Directive? No  Type of Advance Directive -  Does patient want to make changes to medical advance directive? -  Copy of [place] in Chart? -     Chief Complaint  Patient presents with  . Medical Management of Chronic Issues    Routine Visit    HPI:  Pt is a 65 y.o. female seen today at [facility] for medical management of chronic diseases.She has a medical history of HTN,Type 2 DM, COPD, CAD, chronic pain, Depression among other conditions. She is seen in her room today. She request referral to Audiologist for evaluation of her hearing.she states her hearing is worsening has to keep TV very loud in order to hear but other residence complain that the TV is too loud. She was recently seen by dermatologist for removal on multiple skin tags but states still has other skin tags on right side of the neck would also like another referral to dermatologist.She also request her Remeron to be restarted states " It's the only thing that helps me to sleep". Remeron previously  discontinued by MD due to increased weight gain and hyperglycemia.Patient sleeps mostly during the day. Encouraged her to get OOB daily and participate in facility activities. Her CBG's ranging in the 160's-200's. She continues to drink Pepsi and noncompliance with CCD. Facility staff reports patient orders out fast food like large [employer] and others during the night.     Past Medical History:  Diagnosis Date  . Anemia   . CAD (coronary artery disease)   . Chronic pain   . COPD (chronic obstructive pulmonary disease) ([place])   . Depression   . Diabetes mellitus without complication ([place])   . Fall   . Hemiplegia affecting non-dominant side, post-stroke ([place])   . Hyperlipidemia   . Hypertension   . Stroke ([place]) 12/2013   left side paralysis  . Thyroid disease   . Urine retention    Past Surgical History:  Procedure Laterality Date  . ABDOMINAL HYSTERECTOMY    . APPENDECTOMY    . CHOLECYSTECTOMY    . JOINT REPLACEMENT     bil hip  . TONSILLECTOMY      Allergies  Allergen Reactions  . Onion Shortness Of Breath and Swelling  . Pollen Extract Itching  . Morphine And Related Hives    PT DENIES ALLERGY  . Niaspan [Niacin Er] Other (See Comments)    unknown  . Other     onions  . Trazodone And Nefazodone Other (See Comments)    Hallucinations  . Valium [Diazepam] Other (See Comments)    unknown  . Wellbutrin [Bupropion] Other (See Comments)  unknown      Medication List       Accurate as of 08/01/16  3:35 PM. Always use your most recent med list.          aspirin 81 MG chewable tablet Chew 81 mg by mouth daily.   baclofen 10 MG tablet Commonly known as:  LIORESAL Take 10 mg by mouth 3 (three) times daily. For muscle spasms   CAPSAICIN-MENTHOL EX Apply  0.025% cream topically to feet twice daily for pain.   clonazePAM 0.5 MG tablet Commonly known as:  KLONOPIN Take 0.5 mg by mouth every 6 (six) hours as needed for anxiety.   cyclobenzaprine 5 MG  tablet Commonly known as:  FLEXERIL Take 5 mg by mouth 2 (two) times daily.   docusate sodium 100 MG capsule Commonly known as:  COLACE Take 2 capsules (200 mg total) by mouth 2 (two) times daily.   fluticasone 50 MCG/ACT nasal spray Commonly known as:  FLONASE Place 2 sprays into both nostrils daily. For chronic sinus infections   furosemide 20 MG tablet Commonly known as:  LASIX Take 20 mg by mouth daily.   gabapentin 400 MG capsule Commonly known as:  NEURONTIN Take 1 (400 mg) capsule by mouth three times a day with meals. Also take 3 (1200mg ) capsules by mouth daily at bedtime.   glimepiride 2 MG tablet Commonly known as:  AMARYL Take 2 mg by mouth daily with breakfast.   hydrOXYzine 25 MG tablet Commonly known as:  ATARAX/VISTARIL Take 25 mg by mouth every 8 (eight) hours as needed for itching.   insulin lispro 100 UNIT/ML injection Commonly known as:  HUMALOG Inject 36 Units into the skin 3 (three) times daily before meals. For CBG >/= 150 inject 10 additional units subcutaneously   isosorbide mononitrate 30 MG 24 hr tablet Commonly known as:  IMDUR Take 30 mg by mouth daily.   levothyroxine 25 MCG tablet Commonly known as:  SYNTHROID, LEVOTHROID Take 25 mcg by mouth daily before breakfast.   linaclotide 145 MCG Caps capsule Commonly known as:  LINZESS Take 145 mcg by mouth daily. Give 30 minutes prior to first meal and taken on an empty stomach.   linagliptin 5 MG Tabs tablet Commonly known as:  TRADJENTA Take 5 mg by mouth daily.   Melatonin 3 MG Tabs Take 6 mg by mouth at bedtime. 2 tablets   metFORMIN 1000 MG tablet Commonly known as:  GLUCOPHAGE Take 1,000 mg by mouth 2 (two) times daily with a meal.   nitroGLYCERIN 0.4 MG SL tablet Commonly known as:  NITROSTAT Place 0.4 mg under the tongue every 5 (five) minutes as needed for chest pain. Notify MD if not relieved by NTG   omeprazole 20 MG capsule Commonly known as:  PRILOSEC Take 20 mg by  mouth daily.   oxyCODONE-acetaminophen 10-325 MG tablet Commonly known as:  PERCOCET Take one tablet by mouth every six hours for chronic pain management. Do not exceed 4gm of Tylenol in 24 hours   OXYGEN Inhale 2 L into the lungs as needed.   promethazine 12.5 MG tablet Commonly known as:  PHENERGAN Take 12.5 mg by mouth every 6 (six) hours as needed for nausea or vomiting.   rosuvastatin 40 MG tablet Commonly known as:  CRESTOR Take 40 mg by mouth at bedtime. Use brand name for insurance   sertraline 100 MG tablet Commonly known as:  ZOLOFT Take 100 mg by mouth daily. Take by mouth daily  along with  25 mg tablet to equal 125 mg total.   sertraline 25 MG tablet Commonly known as:  ZOLOFT Take 25 mg by mouth daily. Along with 100mg  to =125mg    simethicone 80 MG chewable tablet Commonly known as:  MYLICON Chew 0000000 mg by mouth 3 (three) times daily.   spironolactone 25 MG tablet Commonly known as:  ALDACTONE Take 25 mg by mouth 2 (two) times daily. For edema   SYSTANE ULTRA OP Place 1 drop into both eyes at bedtime as needed. Wait 3 to 5 minutes between 2 eye meds   tiotropium 18 MCG inhalation capsule Commonly known as:  SPIRIVA Inhale 1 capsule (18 MCG CP Handihaler)  contents by taking two separate inhalations via handihaler device daily for COPD   TOUJEO SOLOSTAR 300 UNIT/ML Sopn Generic drug:  Insulin Glargine Inject 128 Units into the skin at bedtime.   VENTOLIN HFA 108 (90 Base) MCG/ACT inhaler Generic drug:  albuterol Inhale 2 puffs into the lungs every 8 (eight) hours as needed for wheezing or shortness of breath.   zinc oxide 11.3 % Crea cream Commonly known as:  BALMEX Apply cream topically to buttocks and peri-anal as needed for redness       Review of Systems  Constitutional: Negative for activity change, appetite change, chills, fatigue and fever.  HENT: Negative for congestion, rhinorrhea, sinus pressure, sneezing and sore throat.   Eyes:  Negative.   Respiratory: Negative for cough, chest tightness, shortness of breath and wheezing.   Cardiovascular: Negative for chest pain, palpitations and leg swelling.  Gastrointestinal: Negative for abdominal distention, abdominal pain, constipation, nausea and vomiting.  Endocrine: Negative for polydipsia, polyphagia and polyuria.  Genitourinary: Negative for dysuria, flank pain and urgency.       Incontinent   Musculoskeletal: Positive for gait problem.       Left Foot pain   Skin: Negative for color change, pallor and rash.  Neurological: Negative for dizziness, seizures, syncope, light-headedness and headaches.  Hematological: Does not bruise/bleed easily.  Psychiatric/Behavioral: Negative.  Negative for agitation, confusion, hallucinations and sleep disturbance. The patient is not nervous/anxious.     Immunization History  Administered Date(s) Administered  . Influenza-Unspecified 06/19/2014, 06/22/2015  . PPD Test 12/26/2013, 01/12/2014, 05/18/2015  . Pneumococcal-Unspecified 07/02/2014   Pertinent  Health Maintenance Due  Topic Date Due  . OPHTHALMOLOGY EXAM  09/11/2016 (Originally 09/01/2015)  . INFLUENZA VACCINE  09/11/2017 (Originally 04/11/2016)  . PNA vac Low Risk Adult (1 of 2 - PCV13) 09/11/2017 (Originally 01/09/2016)  . FOOT EXAM  09/14/2016  . URINE MICROALBUMIN  12/02/2016  . HEMOGLOBIN A1C  12/12/2016  . MAMMOGRAM  12/01/2017  . PAP SMEAR  12/02/2018  . COLONOSCOPY  02/07/2024  . DEXA SCAN  Completed   No flowsheet data found. Functional Status Survey:    Vitals:   08/01/16 1457  BP: 137/63  Pulse: 75  Resp: 18  Temp: 97.4 F (36.3 C)  Weight: 202 lb 12.8 oz (92 kg)  Height: 5\' 3"  (1.6 m)   Body mass index is 35.92 kg/m. Physical Exam  Constitutional: She is oriented to person, place, and time. She appears well-developed and well-nourished. No distress.  Sleepy during visit.  HENT:  Head: Normocephalic.  Right Ear: External ear normal.  Left  Ear: External ear normal.  Mouth/Throat: Oropharynx is clear and moist.  Eyes: Conjunctivae and EOM are normal. Pupils are equal, round, and reactive to light. Right eye exhibits no discharge. Left eye exhibits no discharge. No scleral icterus.  Neck: Normal range of motion. No JVD present. No thyromegaly present.  Cardiovascular: Normal rate, regular rhythm, normal heart sounds and intact distal pulses.  Exam reveals no gallop and no friction rub.   No murmur heard. Pulmonary/Chest: Effort normal and breath sounds normal. No respiratory distress. She has no wheezes. She has no rales.  Abdominal: Soft. Bowel sounds are normal. She exhibits no distension and no mass. There is no rebound and no guarding.  Genitourinary:  Genitourinary Comments: Incontinent for both B/B   Musculoskeletal: She exhibits no edema or tenderness.  Bilateral foot drop. Hemiparalysis   Lymphadenopathy:    She has no cervical adenopathy.  Neurological: She is oriented to person, place, and time.  Skin: Skin is warm and dry. No rash noted. No erythema. No pallor.  Skin intact  Psychiatric: She has a normal mood and affect.    Labs reviewed:  Recent Labs  09/02/15 12/13/15 06/27/16  NA 139 142 140  K 4.2 4.3 3.8  BUN 19 11 14   CREATININE 0.7 0.7 0.8    Recent Labs  09/02/15  AST 22  ALT 16  ALKPHOS 113    Recent Labs  09/02/15 12/13/15 06/27/16  WBC 7.5 10.7 13.7  HGB 11.5* 10.4* 12.5  HCT 38 35* 41  PLT 270 246 266   Lab Results  Component Value Date   TSH 4.06 06/27/2016   Lab Results  Component Value Date   HGBA1C 9.3 06/13/2016   Lab Results  Component Value Date   CHOL 196 06/27/2016   HDL 26 (A) 06/27/2016   LDLCALC 119 06/27/2016   TRIG 255 (A) 06/27/2016   Assessment/Plan Type 2 DM CBG's ranging in 160's-200's. Noncompliant with diet. Continue to encourage CCD. Continue on Toujeo, Glimepiride, Tradjenta, Metformin and Humalog per SSI. Monitor Hgb A1C. On statin and  ASA.Upto  date with annual foot exam. Refer to Opthalmology for annual eye exam.   Hypothyroidism Continue on Levothyroxine 145 mcg Tablet daily. Monitor TSH level. Continue to follow up with Endocrinology.   Neuropathic pain  Continue on gabapentin and capsaicin.continue to control high risk factors.   Dyslipidemia Continue on Crestor 40 mg Tablet. Monitor lipid panel every 3-6 months.   Depression Continue on Zoloft 125 mg Tablet. Continue to follow up with Psychiatry service.   Insomnia Continue on Melatonin. Encourage to get OOB during the day and participate in facility activities. Encourage to turn off TV at bedtime.    HTN B/p stable. Continue on Aldactone and Imdur.    Hearing Loss  Refer to Audiology for worsening hearing evaluation.    Family/ staff Communication: Reviewed plan of care with patient and facility Nurse supervisor.   Labs/tests ordered: None

## 2016-08-02 DIAGNOSIS — F411 Generalized anxiety disorder: Secondary | ICD-10-CM | POA: Diagnosis not present

## 2016-08-02 DIAGNOSIS — G4700 Insomnia, unspecified: Secondary | ICD-10-CM | POA: Diagnosis not present

## 2016-08-02 DIAGNOSIS — F329 Major depressive disorder, single episode, unspecified: Secondary | ICD-10-CM | POA: Diagnosis not present

## 2016-08-31 ENCOUNTER — Non-Acute Institutional Stay (SKILLED_NURSING_FACILITY): Payer: Medicare Other | Admitting: Family

## 2016-08-31 ENCOUNTER — Encounter: Payer: Self-pay | Admitting: Family

## 2016-08-31 DIAGNOSIS — Z794 Long term (current) use of insulin: Secondary | ICD-10-CM

## 2016-08-31 DIAGNOSIS — J449 Chronic obstructive pulmonary disease, unspecified: Secondary | ICD-10-CM

## 2016-08-31 DIAGNOSIS — I110 Hypertensive heart disease with heart failure: Secondary | ICD-10-CM | POA: Diagnosis not present

## 2016-08-31 DIAGNOSIS — I2510 Atherosclerotic heart disease of native coronary artery without angina pectoris: Secondary | ICD-10-CM | POA: Diagnosis not present

## 2016-08-31 DIAGNOSIS — E039 Hypothyroidism, unspecified: Secondary | ICD-10-CM

## 2016-08-31 DIAGNOSIS — F329 Major depressive disorder, single episode, unspecified: Secondary | ICD-10-CM | POA: Diagnosis not present

## 2016-08-31 DIAGNOSIS — E1165 Type 2 diabetes mellitus with hyperglycemia: Secondary | ICD-10-CM | POA: Diagnosis not present

## 2016-08-31 DIAGNOSIS — E1140 Type 2 diabetes mellitus with diabetic neuropathy, unspecified: Secondary | ICD-10-CM | POA: Diagnosis not present

## 2016-08-31 DIAGNOSIS — I509 Heart failure, unspecified: Secondary | ICD-10-CM

## 2016-08-31 DIAGNOSIS — IMO0002 Reserved for concepts with insufficient information to code with codable children: Secondary | ICD-10-CM

## 2016-08-31 NOTE — Progress Notes (Signed)
Patient ID: Susan [name], female   DOB: 1951/03/02, 65 y.o.   MRN: [id number]  Location:  [facility] Room Number: [place] of Service:  SNF (31) Provider: Dinah Ngetich FNP-C   Blanchie Serve, MD  Patient Care Team: Blanchie Serve, MD as PCP - General (Internal Medicine) Sandrea Hughs, NP as Nurse Practitioner (Family Medicine)  Extended Emergency Contact Information Primary Emergency Contact: Roblstow,Beth Address: [address]          [place], [place] [address] Johnnette Litter of [place] Phone: [phone] Work Phone: [phone] Mobile Phone: [phone] Relation: Daughter Secondary Emergency Contact: Wortmann,Scott Address: [address]          [place], [place] [address] Johnnette Litter of [place] Phone: [phone] Mobile Phone: [phone] Relation: Spouse  Code Status: Full code  Goals of care: Advanced Directive information Advanced Directives 08/31/2016  Does Patient Have a Medical Advance Directive? No  Type of Advance Directive -  Does patient want to make changes to medical advance directive? -  Copy of [place] in Chart? -  Would patient like information on creating a medical advance directive? No - Patient declined     Chief Complaint  Patient presents with  . Medical Management of Chronic Issues    Routine Visit    HPI:  Pt is a 65 y.o. female seen today at [facility] for medical management of chronic diseases. She has a medical history of Type 2 DM, HTN, CHF, COPD, CAD, CVA, Hypothyroidism, Depression among other conditions. She is seen in her room today sitting up on her wheelchair. She denies any acute issues this visit. She has had no weight changes, fall episodes or recent hospital admission since prior visit. Facility Nurse reports no new concerns.   Past Medical History:  Diagnosis Date  . Anemia   . CAD (coronary artery disease)   . Chronic pain   . COPD  (chronic obstructive pulmonary disease) ([place])   . Depression   . Diabetes mellitus without complication ([place])   . Fall   . Hemiplegia affecting non-dominant side, post-stroke ([place])   . Hyperlipidemia   . Hypertension   . Stroke ([place]) 12/2013   left side paralysis  . Thyroid disease   . Urine retention    Past Surgical History:  Procedure Laterality Date  . ABDOMINAL HYSTERECTOMY    . APPENDECTOMY    . CHOLECYSTECTOMY    . JOINT REPLACEMENT     bil hip  . TONSILLECTOMY      Allergies  Allergen Reactions  . Onion Shortness Of Breath and Swelling  . Pollen Extract Itching  . Morphine And Related Hives    PT DENIES ALLERGY  . Niaspan [Niacin Er] Other (See Comments)    unknown  . Other     onions  . Trazodone And Nefazodone Other (See Comments)    Hallucinations  . Valium [Diazepam] Other (See Comments)    unknown  . Wellbutrin [Bupropion] Other (See Comments)    unknown    Allergies as of 08/31/2016      Reactions   Onion Shortness Of Breath, Swelling   Pollen Extract Itching   Morphine And Related Hives   PT DENIES ALLERGY   Niaspan [niacin Er] Other (See Comments)   unknown   Other    onions   Trazodone And Nefazodone Other (See Comments)   Hallucinations   Valium [diazepam] Other (See Comments)  unknown   Wellbutrin [bupropion] Other (See Comments)   unknown      Medication List       Accurate as of 08/31/16 12:35 PM. Always use your most recent med list.          aspirin 81 MG chewable tablet Chew 81 mg by mouth daily.   baclofen 10 MG tablet Commonly known as:  LIORESAL Take 10 mg by mouth 3 (three) times daily. For muscle spasms   CAPSAICIN-MENTHOL EX Apply  0.025% cream topically to feet twice daily for pain.   clonazePAM 0.5 MG tablet Commonly known as:  KLONOPIN Take 0.5 mg by mouth every 8 (eight) hours as needed for anxiety.   cyclobenzaprine 5 MG tablet Commonly known as:  FLEXERIL Take 5 mg by mouth 2 (two) times daily.     docusate sodium 100 MG capsule Commonly known as:  COLACE Take 2 capsules (200 mg total) by mouth 2 (two) times daily.   fluticasone 50 MCG/ACT nasal spray Commonly known as:  FLONASE Place 2 sprays into both nostrils daily. For chronic sinus infections   furosemide 20 MG tablet Commonly known as:  LASIX Take 20 mg by mouth daily.   gabapentin 400 MG capsule Commonly known as:  NEURONTIN Take 1 (400 mg) capsule by mouth three times a day with meals. Also take 3 (1200mg ) capsules by mouth daily at bedtime.   glimepiride 2 MG tablet Commonly known as:  AMARYL Take 2 mg by mouth daily with breakfast.   hydrOXYzine 25 MG tablet Commonly known as:  ATARAX/VISTARIL Take 25 mg by mouth every 8 (eight) hours as needed for itching.   insulin lispro 100 UNIT/ML injection Commonly known as:  HUMALOG Inject 36 Units into the skin 3 (three) times daily before meals. For CBG >/= 150 inject 10 additional units subcutaneously   isosorbide mononitrate 30 MG 24 hr tablet Commonly known as:  IMDUR Take 30 mg by mouth daily.   levothyroxine 25 MCG tablet Commonly known as:  SYNTHROID, LEVOTHROID Take 25 mcg by mouth daily before breakfast.   linaclotide 145 MCG Caps capsule Commonly known as:  LINZESS Take 145 mcg by mouth daily. Give 30 minutes prior to first meal and taken on an empty stomach.   linagliptin 5 MG Tabs tablet Commonly known as:  TRADJENTA Take 5 mg by mouth daily.   Melatonin 3 MG Tabs Take 6 mg by mouth at bedtime. 2 tablets   metFORMIN 1000 MG tablet Commonly known as:  GLUCOPHAGE Take 1,000 mg by mouth 2 (two) times daily with a meal.   nitroGLYCERIN 0.4 MG SL tablet Commonly known as:  NITROSTAT Place 0.4 mg under the tongue every 5 (five) minutes as needed for chest pain. Notify MD if not relieved by NTG   omeprazole 20 MG capsule Commonly known as:  PRILOSEC Take 20 mg by mouth daily.   oxyCODONE-acetaminophen 10-325 MG tablet Commonly known as:   PERCOCET Take one tablet by mouth every six hours for chronic pain management. Do not exceed 4gm of Tylenol in 24 hours   OXYGEN Inhale 2 L into the lungs as needed.   promethazine 12.5 MG tablet Commonly known as:  PHENERGAN Take 12.5 mg by mouth every 6 (six) hours as needed for nausea or vomiting.   rosuvastatin 40 MG tablet Commonly known as:  CRESTOR Take 40 mg by mouth at bedtime. Use brand name for insurance   sertraline 100 MG tablet Commonly known as:  ZOLOFT Take 100 mg  by mouth daily. Take by mouth daily  along with 25 mg tablet to equal 125 mg total.   sertraline 25 MG tablet Commonly known as:  ZOLOFT Take 25 mg by mouth daily. Along with 100mg  to =125mg    simethicone 80 MG chewable tablet Commonly known as:  MYLICON Chew 0000000 mg by mouth 3 (three) times daily.   spironolactone 25 MG tablet Commonly known as:  ALDACTONE Take 25 mg by mouth 2 (two) times daily. For edema   SYSTANE ULTRA OP Place 1 drop into both eyes at bedtime as needed. Wait 3 to 5 minutes between 2 eye meds   tiotropium 18 MCG inhalation capsule Commonly known as:  SPIRIVA Inhale 1 capsule (18 MCG CP Handihaler)  contents by taking two separate inhalations via handihaler device daily for COPD   TOUJEO SOLOSTAR 300 UNIT/ML Sopn Generic drug:  Insulin Glargine Inject 128 Units into the skin at bedtime.   VENTOLIN HFA 108 (90 Base) MCG/ACT inhaler Generic drug:  albuterol Inhale 2 puffs into the lungs every 8 (eight) hours as needed for wheezing or shortness of breath.   zinc oxide 11.3 % Crea cream Commonly known as:  BALMEX Apply cream topically to buttocks and peri-anal as needed for redness       Review of Systems  Constitutional: Negative for activity change, appetite change, chills, fatigue and fever.  HENT: Negative for congestion, rhinorrhea, sinus pressure, sneezing and sore throat.   Eyes: Negative.   Respiratory: Negative for cough, chest tightness, shortness of breath and  wheezing.   Cardiovascular: Negative for chest pain, palpitations and leg swelling.  Gastrointestinal: Negative for abdominal distention, abdominal pain, constipation, nausea and vomiting.  Endocrine: Negative for polydipsia, polyphagia and polyuria.  Genitourinary: Negative for dysuria, flank pain and urgency.       Incontinent   Musculoskeletal: Positive for gait problem.       Foot pain controlled with current regimen.   Skin: Negative for color change, pallor and rash.  Neurological: Negative for dizziness, seizures, syncope, light-headedness and headaches.  Hematological: Does not bruise/bleed easily.  Psychiatric/Behavioral: Negative.  Negative for agitation, confusion, hallucinations and sleep disturbance. The patient is not nervous/anxious.     Immunization History  Administered Date(s) Administered  . Influenza-Unspecified 06/19/2014, 06/22/2015  . PPD Test 12/26/2013, 01/12/2014, 05/18/2015  . Pneumococcal-Unspecified 07/02/2014   Pertinent  Health Maintenance Due  Topic Date Due  . OPHTHALMOLOGY EXAM  09/11/2016 (Originally 09/01/2015)  . INFLUENZA VACCINE  09/11/2017 (Originally 04/11/2016)  . PNA vac Low Risk Adult (1 of 2 - PCV13) 09/11/2017 (Originally 01/09/2016)  . FOOT EXAM  09/14/2016  . URINE MICROALBUMIN  12/02/2016  . HEMOGLOBIN A1C  12/12/2016  . MAMMOGRAM  12/01/2017  . PAP SMEAR  12/02/2018  . COLONOSCOPY  02/07/2024  . DEXA SCAN  Completed    Vitals:   08/31/16 1233  BP: (!) 122/56  Pulse: 83  Resp: 20  Temp: 97.7 F (36.5 C)  TempSrc: Oral  SpO2: 90%  Weight: 196 lb 6.4 oz (89.1 kg)  Height: 5\' 3"  (1.6 m)   Body mass index is 34.79 kg/m. Physical Exam  Constitutional: She is oriented to person, place, and time. She appears well-developed and well-nourished. No distress.  HENT:  Head: Normocephalic.  Right Ear: External ear normal.  Left Ear: External ear normal.  Mouth/Throat: Oropharynx is clear and moist.  Eyes: Conjunctivae and EOM are  normal. Pupils are equal, round, and reactive to light. Right eye exhibits no discharge. Left eye  exhibits no discharge. No scleral icterus.  Neck: Normal range of motion. No JVD present. No thyromegaly present.  Cardiovascular: Normal rate, regular rhythm, normal heart sounds and intact distal pulses.  Exam reveals no gallop and no friction rub.   No murmur heard. Pulmonary/Chest: Effort normal and breath sounds normal. No respiratory distress. She has no wheezes. She has no rales.  Abdominal: Soft. Bowel sounds are normal. She exhibits no distension and no mass. There is no rebound and no guarding.  Genitourinary:  Genitourinary Comments: Incontinent for both B/B   Musculoskeletal: She exhibits no edema or tenderness.  Bilateral foot drop. Hemiparalysis   Lymphadenopathy:    She has no cervical adenopathy.  Neurological: She is oriented to person, place, and time.  Skin: Skin is warm and dry. No rash noted. No erythema. No pallor.  Psychiatric: She has a normal mood and affect.    Labs reviewed:  Recent Labs  09/02/15 12/13/15 06/27/16  NA 139 142 140  K 4.2 4.3 3.8  BUN 19 11 14   CREATININE 0.7 0.7 0.8    Recent Labs  09/02/15  AST 22  ALT 16  ALKPHOS 113    Recent Labs  09/02/15 12/13/15 06/27/16  WBC 7.5 10.7 13.7  HGB 11.5* 10.4* 12.5  HCT 38 35* 41  PLT 270 246 266   Lab Results  Component Value Date   TSH 4.06 06/27/2016   Lab Results  Component Value Date   HGBA1C 9.3 06/13/2016   Lab Results  Component Value Date   CHOL 196 06/27/2016   HDL 26 (A) 06/27/2016   LDLCALC 119 06/27/2016   TRIG 255 (A) 06/27/2016   Assessment/Plan HTN B/p stable. Continue imdur. Monitor BMP  CHF No recent abrupt weight gain.Exam findings negative for edema, shortness of breath, rales or wheezing. Continue on furosemide  CAD Chest pain free. Continue on ASA.   COPD Stable. Exam findings negative for cough or wheezing. Continue on Albuterol PRN    Hypothyroidism Continue on levothyroxine. Monitor TSH level.   Type 2 DM CBG's stable.Continue on Tuojeo, Amryl, Humalog and Tradjenta.continue to monitor Hgb A1C.  Depression Stable. Continue on Zoloft 125 mg Tablet. Monitor for mood changes.   Family/ staff Communication: Reviewed plan of care with patient and facility Nurse supervisor.   Labs/tests ordered: None

## 2016-09-06 DIAGNOSIS — E113291 Type 2 diabetes mellitus with mild nonproliferative diabetic retinopathy without macular edema, right eye: Secondary | ICD-10-CM | POA: Diagnosis not present

## 2016-09-06 DIAGNOSIS — Z0100 Encounter for examination of eyes and vision without abnormal findings: Secondary | ICD-10-CM | POA: Diagnosis not present

## 2016-09-06 DIAGNOSIS — H2513 Age-related nuclear cataract, bilateral: Secondary | ICD-10-CM | POA: Diagnosis not present

## 2016-09-13 DIAGNOSIS — E1165 Type 2 diabetes mellitus with hyperglycemia: Secondary | ICD-10-CM | POA: Diagnosis not present

## 2016-09-13 LAB — HEMOGLOBIN A1C: HEMOGLOBIN A1C: 9.1

## 2016-09-29 ENCOUNTER — Non-Acute Institutional Stay (SKILLED_NURSING_FACILITY): Payer: Medicare Other | Admitting: Internal Medicine

## 2016-09-29 ENCOUNTER — Encounter: Payer: Self-pay | Admitting: Internal Medicine

## 2016-09-29 DIAGNOSIS — G8112 Spastic hemiplegia affecting left dominant side: Secondary | ICD-10-CM

## 2016-09-29 DIAGNOSIS — F339 Major depressive disorder, recurrent, unspecified: Secondary | ICD-10-CM

## 2016-09-29 DIAGNOSIS — T402X5A Adverse effect of other opioids, initial encounter: Secondary | ICD-10-CM | POA: Diagnosis not present

## 2016-09-29 DIAGNOSIS — L853 Xerosis cutis: Secondary | ICD-10-CM | POA: Diagnosis not present

## 2016-09-29 DIAGNOSIS — G8114 Spastic hemiplegia affecting left nondominant side: Secondary | ICD-10-CM

## 2016-09-29 DIAGNOSIS — E1169 Type 2 diabetes mellitus with other specified complication: Secondary | ICD-10-CM | POA: Insufficient documentation

## 2016-09-29 DIAGNOSIS — K5903 Drug induced constipation: Secondary | ICD-10-CM | POA: Insufficient documentation

## 2016-09-29 DIAGNOSIS — I110 Hypertensive heart disease with heart failure: Secondary | ICD-10-CM | POA: Diagnosis not present

## 2016-09-29 DIAGNOSIS — E669 Obesity, unspecified: Secondary | ICD-10-CM | POA: Diagnosis not present

## 2016-09-29 NOTE — Progress Notes (Signed)
Patient ID: Susan [name], female   DOB: [birth year], 66 y.o.   MRN: [id number]     [facility]  PCP: Blanchie Serve, MD  Code Status: DNR  Allergies  Allergen Reactions  . Onion Shortness Of Breath and Swelling  . Pollen Extract Itching  . Morphine And Related Hives    PT DENIES ALLERGY  . Niaspan [Niacin Er] Other (See Comments)    unknown  . Other     onions  . Trazodone And Nefazodone Other (See Comments)    Hallucinations  . Valium [Diazepam] Other (See Comments)    unknown  . Wellbutrin [Bupropion] Other (See Comments)    unknown    Chief Complaint  Patient presents with  . Medical Management of Chronic Issues    Routine Visit    Advanced Directives 08/31/2016  Does Patient Have a Medical Advance Directive? No  Type of Advance Directive -  Does patient want to make changes to medical advance directive? -  Copy of [place] in Chart? -  Would patient like information on creating a medical advance directive? No - Patient declined    HPI 66 y/o female patient is seen for routine visit. She complaints of nausea and vomiting with loose stool until few days back and mentions this has now resolved. Her appetite has been good. She complaints of itching around her abdominal wall. She has been anxious and would like medical help for this. her pain has been under control at present. No other concerns. Reviewed CBG and BP reading.   Review of Systems  Constitutional: Negative for fever, chills HENT: Negative for sore throat.   Eyes: Negative for blurred vision, double vision and discharge.  Respiratory: Negative for cough, shortness of breath and wheezing.   Cardiovascular: Negative for chest pain, palpitations.  Gastrointestinal: Negative for abdominal pain, diarrhea, nausea and vomiting.  Genitourinary: Negative for dysuria  Musculoskeletal: Negative for fall Skin: Negative for rash, wounds  Neurological: Negative for dizziness,  headache  Psychiatric/Behavioral: positive for depression and anxiety and followed by psych services   Past Medical History:  Diagnosis Date  . Anemia   . CAD (coronary artery disease)   . Chronic pain   . COPD (chronic obstructive pulmonary disease) ([place])   . Depression   . Diabetes mellitus without complication ([place])   . Fall   . Hemiplegia affecting non-dominant side, post-stroke ([place])   . Hyperlipidemia   . Hypertension   . Stroke ([place]) 12/2013   left side paralysis  . Thyroid disease   . Urine retention    Allergies as of 09/29/2016      Reactions   Onion Shortness Of Breath, Swelling   Pollen Extract Itching   Morphine And Related Hives   PT DENIES ALLERGY   Niaspan [niacin Er] Other (See Comments)   unknown   Other    onions   Trazodone And Nefazodone Other (See Comments)   Hallucinations   Valium [diazepam] Other (See Comments)   unknown   Wellbutrin [bupropion] Other (See Comments)   unknown      Medication List       Accurate as of 09/29/16  4:13 PM. Always use your most recent med list.          aspirin 81 MG chewable tablet Chew 81 mg by mouth daily.   baclofen 10 MG tablet Commonly known as:  LIORESAL Take 10 mg by mouth 3 (three) times daily. For muscle spasms   CAPSAICIN-MENTHOL  EX Apply  0.025% cream topically to feet twice daily for pain.   cyclobenzaprine 5 MG tablet Commonly known as:  FLEXERIL Take 5 mg by mouth 2 (two) times daily.   docusate sodium 100 MG capsule Commonly known as:  COLACE Take 2 capsules (200 mg total) by mouth 2 (two) times daily.   fluticasone 50 MCG/ACT nasal spray Commonly known as:  FLONASE Place 2 sprays into both nostrils daily. For chronic sinus infections   furosemide 20 MG tablet Commonly known as:  LASIX Take 20 mg by mouth daily.   gabapentin 400 MG capsule Commonly known as:  NEURONTIN Take 1 (400 mg) capsule by mouth three times a day with meals. Also take 3 (1200mg ) capsules by mouth daily  at bedtime.   glimepiride 2 MG tablet Commonly known as:  AMARYL Take 2 mg by mouth daily with breakfast.   insulin lispro 100 UNIT/ML injection Commonly known as:  HUMALOG Inject 36 Units into the skin 3 (three) times daily before meals. For CBG >/= 150 inject 10 additional units subcutaneously   isosorbide mononitrate 30 MG 24 hr tablet Commonly known as:  IMDUR Take 30 mg by mouth daily.   levothyroxine 25 MCG tablet Commonly known as:  SYNTHROID, LEVOTHROID Take 25 mcg by mouth daily before breakfast.   linaclotide 145 MCG Caps capsule Commonly known as:  LINZESS Take 145 mcg by mouth daily. Give 30 minutes prior to first meal and taken on an empty stomach.   linagliptin 5 MG Tabs tablet Commonly known as:  TRADJENTA Take 5 mg by mouth daily.   Melatonin 3 MG Tabs Take 6 mg by mouth at bedtime. 2 tablets   metFORMIN 1000 MG tablet Commonly known as:  GLUCOPHAGE Take 1,000 mg by mouth 2 (two) times daily with a meal.   nitroGLYCERIN 0.4 MG SL tablet Commonly known as:  NITROSTAT Place 0.4 mg under the tongue every 5 (five) minutes as needed for chest pain. Notify MD if not relieved by NTG   omeprazole 20 MG capsule Commonly known as:  PRILOSEC Take 20 mg by mouth daily.   oxyCODONE-acetaminophen 10-325 MG tablet Commonly known as:  PERCOCET Take one tablet by mouth every six hours for chronic pain management. Do not exceed 4gm of Tylenol in 24 hours   OXYGEN Inhale 2 L into the lungs as needed.   promethazine 12.5 MG tablet Commonly known as:  PHENERGAN Take 12.5 mg by mouth every 6 (six) hours as needed for nausea or vomiting.   rosuvastatin 40 MG tablet Commonly known as:  CRESTOR Take 40 mg by mouth at bedtime. Use brand name for insurance   sertraline 100 MG tablet Commonly known as:  ZOLOFT Take 100 mg by mouth daily. Take by mouth daily  along with 25 mg tablet to equal 125 mg total.   sertraline 25 MG tablet Commonly known as:  ZOLOFT Take 25 mg  by mouth daily. Along with 100mg  to =125mg    simethicone 80 MG chewable tablet Commonly known as:  MYLICON Chew 0000000 mg by mouth 3 (three) times daily.   spironolactone 25 MG tablet Commonly known as:  ALDACTONE Take 25 mg by mouth 2 (two) times daily. For edema   SYSTANE ULTRA OP Place 1 drop into both eyes at bedtime as needed. Wait 3 to 5 minutes between 2 eye meds   tiotropium 18 MCG inhalation capsule Commonly known as:  SPIRIVA Inhale 1 capsule (18 MCG CP Handihaler)  contents by taking two separate  inhalations via handihaler device daily for COPD   TOUJEO SOLOSTAR 300 UNIT/ML Sopn Generic drug:  Insulin Glargine Inject 128 Units into the skin at bedtime.   VENTOLIN HFA 108 (90 Base) MCG/ACT inhaler Generic drug:  albuterol Inhale 2 puffs into the lungs every 8 (eight) hours as needed for wheezing or shortness of breath.   zinc oxide 11.3 % Crea cream Commonly known as:  BALMEX Apply cream topically to buttocks and peri-anal as needed for redness      Physical exam BP 109/64   Pulse 94   Temp 98.7 F (37.1 C) (Oral)   Resp (!) 21   Ht 5\' 3"  (1.6 m)   Wt 198 lb 12.8 oz (90.2 kg)   SpO2 95%   BMI 35.22 kg/m   Wt Readings from Last 3 Encounters:  09/29/16 198 lb 12.8 oz (90.2 kg)  08/31/16 196 lb 6.4 oz (89.1 kg)  08/01/16 202 lb 12.8 oz (92 kg)   General- elderly obese female in no acute distress Head- atraumatic, normocephalic Eyes- no pallor, no icterus, no discharge Neck- no lymphadenopathy Mouth- normal mucus membrane, upper and lower dentures Cardiovascular- normal s1,s2, no murmur Respiratory- bilateral clear to auscultation, no wheeze, no rhonchi, no crackles Abdomen- bowel sounds present, soft, non tender Musculoskeletal- able to move right upper and lower extremities, left sided hemiplegia with left foot drop and spasticity to left side, no leg edema Neurological- alert and oriented to person, place and time Skin- warm and dry, no diaphoreses, dry  skin area on legs, abdomen and trunk      Labs  CBC Latest Ref Rng & Units 06/27/2016 12/13/2015 09/02/2015  WBC 10:3/mL 13.7 10.7 7.5  Hemoglobin 12.0 - 16.0 g/dL 12.5 10.4(A) 11.5(A)  Hematocrit 36 - 46 % 41 35(A) 38  Platelets 150 - 399 K/L 266 246 270   CMP Latest Ref Rng & Units 06/27/2016 12/13/2015 09/02/2015  Glucose 70 - 99 mg/dL - - -  BUN 4 - 21 mg/dL 14 11 19   Creatinine 0.5 - 1.1 mg/dL 0.8 0.7 0.7  Sodium 137 - 147 mmol/L 140 142 139  Potassium 3.4 - 5.3 mmol/L 3.8 4.3 4.2  Chloride 96 - 112 mEq/L - - -  CO2 19 - 32 mEq/L - - -  Calcium 8.4 - 10.5 mg/dL - - -  Total Protein 6.0 - 8.3 g/dL - - -  Total Bilirubin 0.3 - 1.2 mg/dL - - -  Alkaline Phos 25 - 125 U/L - - 113  AST 13 - 35 U/L - - 22  ALT 7 - 35 U/L - - 16   Lab Results  Component Value Date   HGBA1C 9.1 09/13/2016   Lab Results  Component Value Date   TSH 4.06 06/27/2016   Lipid Panel     Component Value Date/Time   CHOL 196 06/27/2016   TRIG 255 (A) 06/27/2016   HDL 26 (A) 06/27/2016   LDLCALC 119 06/27/2016      Assessment/plan  Dry skin With itching to abdominal wall and trunk. To apply hydrocortisone lotion 2.5% bid to abdominal wall area bid for 2 weeks and then as needed. Monitor. Moisturize the skin  Depression and anxiety Increase zoloft to 150 mg daily from 125 mg a day. Also provide lorazepam 1 mg q12h prn x 1 week. Psych to re-evaluate  Left hemiplegia with spasticity Supportive care and fall precautions. Continue percocet 10-325 mg qid and flexeril 5 mg bid along with baclofen 10 mg tid. Get PMR  to evaluate  Type 2 diabetes in obese Lab Results  Component Value Date   HGBA1C 9.1 09/13/2016   Improved from 3 months back. Reviewed cbg. Continue toujeo 128 u daily with humalog 36 u with meals, metformin 1000 mg bid, tradjenta and glimepiride  Hypertensive heart disease with heart failure Currently on imdur 30 mg daily, aldactone 25 mg bid, lasix 20 mg daily. Monitor BP and  BMP.   Opioid induced constipation Continue linzess, hydration encouraged   Blanchie Serve, MD Internal Medicine [facility] [address] [place], [place] [address] Cell Phone (Monday-Friday 8 am - 5 pm): [phone] On Call: [phone] and follow prompts after 5 pm and on weekends Office Phone: [phone] Office Fax: [phone]

## 2016-10-02 ENCOUNTER — Other Ambulatory Visit: Payer: Self-pay | Admitting: *Deleted

## 2016-10-02 MED ORDER — OXYCODONE-ACETAMINOPHEN 10-325 MG PO TABS: ORAL_TABLET | ORAL | 0 refills | Status: DC

## 2016-10-02 NOTE — Telephone Encounter (Signed)
[facility] [phone] Fax: [phone]  

## 2016-10-04 DIAGNOSIS — M79675 Pain in left toe(s): Secondary | ICD-10-CM | POA: Diagnosis not present

## 2016-10-04 DIAGNOSIS — B351 Tinea unguium: Secondary | ICD-10-CM | POA: Diagnosis not present

## 2016-10-04 DIAGNOSIS — E119 Type 2 diabetes mellitus without complications: Secondary | ICD-10-CM | POA: Diagnosis not present

## 2016-10-04 DIAGNOSIS — M79674 Pain in right toe(s): Secondary | ICD-10-CM | POA: Diagnosis not present

## 2016-10-20 DIAGNOSIS — F329 Major depressive disorder, single episode, unspecified: Secondary | ICD-10-CM | POA: Diagnosis not present

## 2016-10-20 DIAGNOSIS — F411 Generalized anxiety disorder: Secondary | ICD-10-CM | POA: Diagnosis not present

## 2016-10-20 DIAGNOSIS — G4700 Insomnia, unspecified: Secondary | ICD-10-CM | POA: Diagnosis not present

## 2016-10-26 ENCOUNTER — Encounter: Payer: Self-pay | Admitting: Family

## 2016-10-26 ENCOUNTER — Non-Acute Institutional Stay (SKILLED_NURSING_FACILITY): Payer: Medicare Other | Admitting: Family

## 2016-10-26 DIAGNOSIS — IMO0002 Reserved for concepts with insufficient information to code with codable children: Secondary | ICD-10-CM

## 2016-10-26 DIAGNOSIS — F329 Major depressive disorder, single episode, unspecified: Secondary | ICD-10-CM | POA: Diagnosis not present

## 2016-10-26 DIAGNOSIS — I110 Hypertensive heart disease with heart failure: Secondary | ICD-10-CM

## 2016-10-26 DIAGNOSIS — E1140 Type 2 diabetes mellitus with diabetic neuropathy, unspecified: Secondary | ICD-10-CM | POA: Diagnosis not present

## 2016-10-26 DIAGNOSIS — F419 Anxiety disorder, unspecified: Secondary | ICD-10-CM | POA: Diagnosis not present

## 2016-10-26 DIAGNOSIS — I509 Heart failure, unspecified: Secondary | ICD-10-CM

## 2016-10-26 DIAGNOSIS — E1165 Type 2 diabetes mellitus with hyperglycemia: Secondary | ICD-10-CM | POA: Diagnosis not present

## 2016-10-26 DIAGNOSIS — Z794 Long term (current) use of insulin: Secondary | ICD-10-CM | POA: Diagnosis not present

## 2016-10-26 NOTE — Progress Notes (Signed)
Patient ID: Susan [name], female   DOB: May 10, 1951, 66 y.o.   MRN: [id number]  Location:  [place] Room Number: 1001-A Place of Service:  SNF (31) Provider: Dinah Ngetich FNP-C   Blanchie Serve, MD  Patient Care Team: Blanchie Serve, MD as PCP - General (Internal Medicine) Sandrea Hughs, NP as Nurse Practitioner [facility] Medicine)  Extended Emergency Contact Information Primary Emergency Contact: Roblstow,Beth Address: [address]          [place], [place] [address] Johnnette Litter of [place] Phone: [phone] Work Phone: [phone] Mobile Phone: [phone] Relation: Daughter Secondary Emergency Contact: Pries,Scott Address: [address]          [place],  [address] Johnnette Litter of [place] Phone: [phone] Mobile Phone: [phone] Relation: Spouse  Code Status: Full code  Goals of care: Advanced Directive information Advanced Directives 08/31/2016  Does Patient Have a Medical Advance Directive? No  Type of Advance Directive -  Does patient want to make changes to medical advance directive? -  Copy of [place] in Chart? -  Would patient like information on creating a medical advance directive? No - Patient declined     Chief Complaint  Patient presents with  . Medical Management of Chronic Issues    Routine Visit    HPI:  Pt is a 66 y.o. female seen today at [facility] for medical management of chronic diseases. She has a medical history of Type 2 DM, HTN, CHF, COPD, CAD, CVA, Hypothyroidism, Depression among other conditions. She is seen in her room today.She was previous seen  In January by MD and had Lorazepam weaned off and Sertraline increased to 150 mg Tablet but she request to be restarted on Xanax 1 mg Tablet for panic attacks.No recent attacks reported.She was recent seen by Psych service no med adjusted.Will have Psych re-evaluate patient medication.She has had  no recent acute illness.No recent weight changes or fall episodes. Facility Nurse reports no new concerns.   Past Medical History:  Diagnosis Date  . Anemia   . CAD (coronary artery disease)   . Chronic pain   . COPD (chronic obstructive pulmonary disease) ([place])   . Depression   . Diabetes mellitus without complication ([place])   . Fall   . Hemiplegia affecting non-dominant side, post-stroke ([place])   . Hyperlipidemia   . Hypertension   . Stroke ([place]) 12/2013   left side paralysis  . Thyroid disease   . Urine retention    Past Surgical History:  Procedure Laterality Date  . ABDOMINAL HYSTERECTOMY    . APPENDECTOMY    . CHOLECYSTECTOMY    . JOINT REPLACEMENT     bil hip  . TONSILLECTOMY      Allergies  Allergen Reactions  . Onion Shortness Of Breath and Swelling  . Pollen Extract Itching  . Morphine And Related Hives    PT DENIES ALLERGY  . Niaspan [Niacin Er] Other (See Comments)    unknown  . Other     onions  . Trazodone And Nefazodone Other (See Comments)    Hallucinations  . Valium [Diazepam] Other (See Comments)    unknown  . Wellbutrin [Bupropion] Other (See Comments)    unknown    Allergies as of 10/26/2016      Reactions   Onion Shortness Of Breath, Swelling   Pollen Extract Itching   Morphine And Related Hives   PT DENIES ALLERGY   Niaspan [  niacin Er] Other (See Comments)   unknown   Other    onions   Trazodone And Nefazodone Other (See Comments)   Hallucinations   Valium [diazepam] Other (See Comments)   unknown   Wellbutrin [bupropion] Other (See Comments)   unknown      Medication List       Accurate as of 10/26/16 12:05 PM. Always use your most recent med list.          aspirin 81 MG chewable tablet Chew 81 mg by mouth daily.   baclofen 10 MG tablet Commonly known as:  LIORESAL Take 10 mg by mouth 3 (three) times daily. For muscle spasms   CAPSAICIN-MENTHOL EX Apply  0.025% cream topically to feet twice daily for pain.     cyclobenzaprine 5 MG tablet Commonly known as:  FLEXERIL Take 5 mg by mouth 2 (two) times daily.   docusate sodium 100 MG capsule Commonly known as:  COLACE Take 2 capsules (200 mg total) by mouth 2 (two) times daily.   fluticasone 50 MCG/ACT nasal spray Commonly known as:  FLONASE Place 2 sprays into both nostrils daily. For chronic sinus infections   furosemide 20 MG tablet Commonly known as:  LASIX Take 20 mg by mouth daily.   gabapentin 400 MG capsule Commonly known as:  NEURONTIN Take 1 (400 mg) capsule by mouth three times a day with meals. Also take 3 (1200mg ) capsules by mouth daily at bedtime.   glimepiride 2 MG tablet Commonly known as:  AMARYL Take 2 mg by mouth daily with breakfast.   hydrocortisone 2.5 % lotion Apply 1 application topically 2 (two) times daily as needed.   insulin lispro 100 UNIT/ML injection Commonly known as:  HUMALOG Inject 36 Units into the skin 3 (three) times daily before meals. For CBG >/= 150 inject 10 additional units subcutaneously   isosorbide mononitrate 30 MG 24 hr tablet Commonly known as:  IMDUR Take 30 mg by mouth daily.   levothyroxine 25 MCG tablet Commonly known as:  SYNTHROID, LEVOTHROID Take 25 mcg by mouth daily before breakfast.   linaclotide 145 MCG Caps capsule Commonly known as:  LINZESS Take 145 mcg by mouth daily. Give 30 minutes prior to first meal and taken on an empty stomach.   linagliptin 5 MG Tabs tablet Commonly known as:  TRADJENTA Take 5 mg by mouth daily.   Melatonin 3 MG Tabs Take 6 mg by mouth at bedtime. 2 tablets   metFORMIN 1000 MG tablet Commonly known as:  GLUCOPHAGE Take 1,000 mg by mouth 2 (two) times daily with a meal.   nitroGLYCERIN 0.4 MG SL tablet Commonly known as:  NITROSTAT Place 0.4 mg under the tongue every 5 (five) minutes as needed for chest pain. Notify MD if not relieved by NTG   omeprazole 20 MG capsule Commonly known as:  PRILOSEC Take 20 mg by mouth daily.    oxyCODONE-acetaminophen 10-325 MG tablet Commonly known as:  PERCOCET Take 1 tablet by mouth every 6 (six) hours as needed for pain.   OXYGEN Inhale 2 L into the lungs as needed.   promethazine 12.5 MG tablet Commonly known as:  PHENERGAN Take 12.5 mg by mouth every 6 (six) hours as needed for nausea or vomiting.   rosuvastatin 40 MG tablet Commonly known as:  CRESTOR Take 40 mg by mouth at bedtime. Use brand name for insurance   sertraline 100 MG tablet Commonly known as:  ZOLOFT Take 100 mg by mouth daily. Take by  mouth daily  along with 25 mg tablet to equal 125 mg total.   sertraline 25 MG tablet Commonly known as:  ZOLOFT Take 25 mg by mouth daily. Along with 100mg  to =125mg    simethicone 80 MG chewable tablet Commonly known as:  MYLICON Chew 0000000 mg by mouth 3 (three) times daily.   spironolactone 25 MG tablet Commonly known as:  ALDACTONE Take 25 mg by mouth 2 (two) times daily. For edema   SYSTANE ULTRA OP Place 1 drop into both eyes at bedtime as needed. Wait 3 to 5 minutes between 2 eye meds   tiotropium 18 MCG inhalation capsule Commonly known as:  SPIRIVA Inhale 1 capsule (18 MCG CP Handihaler)  contents by taking two separate inhalations via handihaler device daily for COPD   TOUJEO SOLOSTAR 300 UNIT/ML Sopn Generic drug:  Insulin Glargine Inject 128 Units into the skin at bedtime.   VENTOLIN HFA 108 (90 Base) MCG/ACT inhaler Generic drug:  albuterol Inhale 2 puffs into the lungs every 8 (eight) hours as needed for wheezing or shortness of breath.       Review of Systems  Constitutional: Negative for activity change, appetite change, chills, fatigue and fever.  HENT: Negative for congestion, rhinorrhea, sinus pressure, sneezing and sore throat.   Eyes: Negative.   Respiratory: Negative for cough, chest tightness, shortness of breath and wheezing.   Cardiovascular: Negative for chest pain, palpitations and leg swelling.  Gastrointestinal: Negative  for abdominal distention, abdominal pain, constipation, nausea and vomiting.  Endocrine: Negative for polydipsia, polyphagia and polyuria.  Genitourinary: Negative for dysuria, flank pain and urgency.       Incontinent   Musculoskeletal: Positive for gait problem.       Foot pain   Skin: Negative for color change, pallor and rash.  Neurological: Negative for dizziness, seizures, syncope, light-headedness and headaches.  Hematological: Does not bruise/bleed easily.  Psychiatric/Behavioral: Negative.  Negative for agitation, confusion, hallucinations and sleep disturbance. The patient is not nervous/anxious.     Immunization History  Administered Date(s) Administered  . Influenza-Unspecified 06/19/2014, 06/22/2015, 06/19/2016  . PPD Test 12/26/2013, 01/12/2014, 05/18/2015  . Pneumococcal-Unspecified 07/02/2014   Pertinent  Health Maintenance Due  Topic Date Due  . OPHTHALMOLOGY EXAM  09/01/2015  . FOOT EXAM  09/14/2016  . INFLUENZA VACCINE  09/11/2017 (Originally 04/11/2016)  . PNA vac Low Risk Adult (1 of 2 - PCV13) 09/11/2017 (Originally 01/09/2016)  . URINE MICROALBUMIN  12/02/2016  . HEMOGLOBIN A1C  03/13/2017  . MAMMOGRAM  12/01/2017  . PAP SMEAR  12/02/2018  . COLONOSCOPY  02/07/2024  . DEXA SCAN  Completed    Vitals:   10/26/16 1148  BP: 125/82  Pulse: 76  Resp: (!) 22  Temp: 98.6 F (37 C)  TempSrc: Oral  SpO2: 98%  Weight: 196 lb 9.6 oz (89.2 kg)  Height: 5\' 3"  (1.6 m)   Body mass index is 34.83 kg/m. Physical Exam  Constitutional: She is oriented to person, place, and time. She appears well-developed and well-nourished. No distress.  HENT:  Head: Normocephalic.  Right Ear: External ear normal.  Left Ear: External ear normal.  Mouth/Throat: Oropharynx is clear and moist.  Eyes: Conjunctivae and EOM are normal. Pupils are equal, round, and reactive to light. Right eye exhibits no discharge. Left eye exhibits no discharge. No scleral icterus.  Neck: Normal  range of motion. No JVD present. No thyromegaly present.  Cardiovascular: Normal rate, regular rhythm, normal heart sounds and intact distal pulses.  Exam reveals  no gallop and no friction rub.   No murmur heard. Pulmonary/Chest: Effort normal and breath sounds normal. No respiratory distress. She has no wheezes. She has no rales.  Abdominal: Soft. Bowel sounds are normal. She exhibits no distension and no mass. There is no rebound and no guarding.  Genitourinary:  Genitourinary Comments: Incontinent for both B/B   Musculoskeletal: She exhibits no edema or tenderness.   foot drop.Hemiparalysis   Lymphadenopathy:    She has no cervical adenopathy.  Neurological: She is oriented to person, place, and time.  Skin: Skin is warm and dry. No rash noted. No erythema. No pallor.  Psychiatric: She has a normal mood and affect.    Labs reviewed:  Recent Labs  12/13/15 06/27/16  NA 142 140  K 4.3 3.8  BUN 11 14  CREATININE 0.7 0.8    Recent Labs  12/13/15 06/27/16  WBC 10.7 13.7  HGB 10.4* 12.5  HCT 35* 41  PLT 246 266   Lab Results  Component Value Date   TSH 4.06 06/27/2016   Lab Results  Component Value Date   HGBA1C 9.1 09/13/2016   Lab Results  Component Value Date   CHOL 196 06/27/2016   HDL 26 (A) 06/27/2016   LDLCALC 119 06/27/2016   TRIG 255 (A) 06/27/2016   Assessment/Plan  Type 2 DM CBG's ranging in the 130's-upper 200's with occ 300's.Noncompliant with carbo consistent diet/snacks. Continue on Tuojeo, Amryl, Humalog and Tradjenta.On ASA.continue to monitor Hgb A1C.  CHF weight gain stable.Exam findings negative for edema, shortness of breath, rales or wheezing.Continue on furosemide and Imdur.Continue to monitor weight.   HTN B/p stable. Continue imdur. Monitor BMP  Depression Stable.Zoloft was increased from 125 mg Tablet to 150 mg Tablet by MD one month ago.No mood changes reported.continue to monitor.   Anxiety  She was wean off Lorazepam one month  ago by MD but she request to be restarted on Xanax states had panic attack. She was seen by Psych service recently no med adjusted. Will continue to monitor. Psych service to reevaluate.   Family/ staff Communication: Reviewed plan of care with patient and facility Nurse supervisor.   Labs/tests ordered: None

## 2016-10-27 DIAGNOSIS — G894 Chronic pain syndrome: Secondary | ICD-10-CM | POA: Diagnosis not present

## 2016-10-31 DIAGNOSIS — G894 Chronic pain syndrome: Secondary | ICD-10-CM | POA: Diagnosis not present

## 2016-11-17 DIAGNOSIS — E1136 Type 2 diabetes mellitus with diabetic cataract: Secondary | ICD-10-CM | POA: Diagnosis not present

## 2016-11-17 DIAGNOSIS — E119 Type 2 diabetes mellitus without complications: Secondary | ICD-10-CM | POA: Diagnosis not present

## 2016-11-17 DIAGNOSIS — H04123 Dry eye syndrome of bilateral lacrimal glands: Secondary | ICD-10-CM | POA: Diagnosis not present

## 2016-11-17 DIAGNOSIS — Z7982 Long term (current) use of aspirin: Secondary | ICD-10-CM | POA: Diagnosis not present

## 2016-11-17 DIAGNOSIS — Z7951 Long term (current) use of inhaled steroids: Secondary | ICD-10-CM | POA: Diagnosis not present

## 2016-11-17 DIAGNOSIS — Z7984 Long term (current) use of oral hypoglycemic drugs: Secondary | ICD-10-CM | POA: Diagnosis not present

## 2016-11-17 DIAGNOSIS — J449 Chronic obstructive pulmonary disease, unspecified: Secondary | ICD-10-CM | POA: Diagnosis not present

## 2016-11-17 DIAGNOSIS — H25813 Combined forms of age-related cataract, bilateral: Secondary | ICD-10-CM | POA: Diagnosis not present

## 2016-11-17 DIAGNOSIS — E7800 Pure hypercholesterolemia, unspecified: Secondary | ICD-10-CM | POA: Diagnosis not present

## 2016-11-17 DIAGNOSIS — I2510 Atherosclerotic heart disease of native coronary artery without angina pectoris: Secondary | ICD-10-CM | POA: Diagnosis not present

## 2016-11-17 DIAGNOSIS — Z79899 Other long term (current) drug therapy: Secondary | ICD-10-CM | POA: Diagnosis not present

## 2016-11-17 DIAGNOSIS — I509 Heart failure, unspecified: Secondary | ICD-10-CM | POA: Diagnosis not present

## 2016-11-17 DIAGNOSIS — Z885 Allergy status to narcotic agent status: Secondary | ICD-10-CM | POA: Diagnosis not present

## 2016-11-17 DIAGNOSIS — Z888 Allergy status to other drugs, medicaments and biological substances status: Secondary | ICD-10-CM | POA: Diagnosis not present

## 2016-11-17 DIAGNOSIS — H2513 Age-related nuclear cataract, bilateral: Secondary | ICD-10-CM | POA: Diagnosis not present

## 2016-11-17 DIAGNOSIS — Z8673 Personal history of transient ischemic attack (TIA), and cerebral infarction without residual deficits: Secondary | ICD-10-CM | POA: Diagnosis not present

## 2016-11-22 ENCOUNTER — Encounter: Payer: Self-pay | Admitting: Family

## 2016-11-22 ENCOUNTER — Non-Acute Institutional Stay (SKILLED_NURSING_FACILITY): Payer: Medicare Other | Admitting: Family

## 2016-11-22 DIAGNOSIS — Z794 Long term (current) use of insulin: Secondary | ICD-10-CM

## 2016-11-22 DIAGNOSIS — IMO0002 Reserved for concepts with insufficient information to code with codable children: Secondary | ICD-10-CM

## 2016-11-22 DIAGNOSIS — E1140 Type 2 diabetes mellitus with diabetic neuropathy, unspecified: Secondary | ICD-10-CM | POA: Diagnosis not present

## 2016-11-22 DIAGNOSIS — I110 Hypertensive heart disease with heart failure: Secondary | ICD-10-CM | POA: Diagnosis not present

## 2016-11-22 DIAGNOSIS — E1165 Type 2 diabetes mellitus with hyperglycemia: Secondary | ICD-10-CM

## 2016-11-22 DIAGNOSIS — E039 Hypothyroidism, unspecified: Secondary | ICD-10-CM | POA: Diagnosis not present

## 2016-11-22 DIAGNOSIS — F419 Anxiety disorder, unspecified: Secondary | ICD-10-CM

## 2016-11-22 NOTE — Progress Notes (Signed)
Patient ID: Susan [name], female   DOB: 1950-10-13, 66 y.o.   MRN: [id number]  Location:  [facility] Room Number: [place] of Service:  SNF (31) Provider: Natori Gudino FNP-C   Blanchie Serve, MD  Patient Care Team: Blanchie Serve, MD as PCP - General (Internal Medicine) Sandrea Hughs, NP as Nurse Practitioner (Family Medicine)  Extended Emergency Contact Information Primary Emergency Contact: Roblstow,Beth Address: [address]          [place], [place] [address] Johnnette Litter of [place] Phone: [phone] Work Phone: [phone] Mobile Phone: [phone] Relation: Daughter Secondary Emergency Contact: Lapoint,Scott Address: [address]          [place], [place] [address] Johnnette Litter of [place] Phone: [phone] Mobile Phone: [phone] Relation: Spouse  Code Status: Full code  Goals of care: Advanced Directive information Advanced Directives 11/22/2016  Does Patient Have a Medical Advance Directive? No  Type of Advance Directive -  Does patient want to make changes to medical advance directive? -  Copy of [place] in Chart? -  Would patient like information on creating a medical advance directive? No - Patient declined     Chief Complaint  Patient presents with  . Medical Management of Chronic Issues    Routine Visit    HPI:  Pt is a 66 y.o. female seen today at [facility] for medical management of chronic diseases. She has a medical history of Type 2 DM, HTN, CHF, COPD, CAD, CVA, Hypothyroidism, Depression among other conditions. She is seen in her room today.She denies any acute issues this visit.Though continues to request to be restarted back on Xanax.Lorazepam previous weaned off by MD. Her Sertraline was increased to 150 mg Tablet by Psychiatry provider. Facility staff reports panic attacks.she has had a weight gain of 4 pounds over one month.No recent acute illness or  fall episodes since prior visit.   Past Medical History:  Diagnosis Date  . Anemia   . CAD (coronary artery disease)   . Chronic pain   . COPD (chronic obstructive pulmonary disease) ([place])   . Depression   . Diabetes mellitus without complication ([place])   . Fall   . Hemiplegia affecting non-dominant side, post-stroke ([place])   . Hyperlipidemia   . Hypertension   . Stroke ([place]) 12/2013   left side paralysis  . Thyroid disease   . Urine retention    Past Surgical History:  Procedure Laterality Date  . ABDOMINAL HYSTERECTOMY    . APPENDECTOMY    . CHOLECYSTECTOMY    . JOINT REPLACEMENT     bil hip  . TONSILLECTOMY      Allergies  Allergen Reactions  . Onion Shortness Of Breath and Swelling  . Pollen Extract Itching  . Morphine And Related Hives    PT DENIES ALLERGY  . Niaspan [Niacin Er] Other (See Comments)    unknown  . Other     onions  . Trazodone And Nefazodone Other (See Comments)    Hallucinations  . Valium [Diazepam] Other (See Comments)    unknown  . Wellbutrin [Bupropion] Other (See Comments)    unknown    Allergies as of 11/22/2016      Reactions   Onion Shortness Of Breath, Swelling   Pollen Extract Itching   Morphine And Related Hives   PT DENIES ALLERGY   Niaspan [niacin Er] Other (See Comments)   unknown   Other  onions   Trazodone And Nefazodone Other (See Comments)   Hallucinations   Valium [diazepam] Other (See Comments)   unknown   Wellbutrin [bupropion] Other (See Comments)   unknown      Medication List       Accurate as of 11/22/16  7:42 PM. Always use your most recent med list.          acetaminophen 325 MG tablet Commonly known as:  TYLENOL Take 650 mg by mouth every 4 (four) hours as needed for mild pain.   aspirin 81 MG chewable tablet Chew 81 mg by mouth daily.   atorvastatin 10 MG tablet Commonly known as:  LIPITOR Take 10 mg by mouth daily.   baclofen 10 MG tablet Commonly known as:  LIORESAL Take 10 mg by  mouth 3 (three) times daily. For muscle spasms   BIOFREEZE 4 % Gel Generic drug:  Menthol (Topical Analgesic) Apply topically 4 (four) times daily. Apply to both feet.   cyclobenzaprine 5 MG tablet Commonly known as:  FLEXERIL Take 5 mg by mouth 2 (two) times daily.   docusate sodium 100 MG capsule Commonly known as:  COLACE Take 2 capsules (200 mg total) by mouth 2 (two) times daily.   fluticasone 50 MCG/ACT nasal spray Commonly known as:  FLONASE Place 2 sprays into both nostrils daily. For chronic sinus infections   furosemide 20 MG tablet Commonly known as:  LASIX Take 20 mg by mouth daily.   gabapentin 400 MG capsule Commonly known as:  NEURONTIN Take 1 (400 mg) capsule by mouth three times a day with meals. Also take 3 (1200mg ) capsules by mouth daily at bedtime.   glimepiride 2 MG tablet Commonly known as:  AMARYL Take 2 mg by mouth daily with breakfast.   hydrocortisone 2.5 % lotion Apply 1 application topically 2 (two) times daily as needed.   insulin lispro 100 UNIT/ML injection Commonly known as:  HUMALOG Inject 36 Units into the skin 3 (three) times daily before meals. For CBG >/= 150 inject 10 additional units subcutaneously   isosorbide mononitrate 30 MG 24 hr tablet Commonly known as:  IMDUR Take 30 mg by mouth daily.   levothyroxine 25 MCG tablet Commonly known as:  SYNTHROID, LEVOTHROID Take 25 mcg by mouth daily before breakfast.   linaclotide 145 MCG Caps capsule Commonly known as:  LINZESS Take 145 mcg by mouth daily. Give 30 minutes prior to first meal and taken on an empty stomach.   linagliptin 5 MG Tabs tablet Commonly known as:  TRADJENTA Take 5 mg by mouth daily.   Melatonin 3 MG Tabs Take 6 mg by mouth at bedtime. 2 tablets   metFORMIN 1000 MG tablet Commonly known as:  GLUCOPHAGE Take 1,000 mg by mouth 2 (two) times daily with a meal.   nitroGLYCERIN 0.4 MG SL tablet Commonly known as:  NITROSTAT Place 0.4 mg under the tongue  every 5 (five) minutes as needed for chest pain. Notify MD if not relieved by NTG   omeprazole 20 MG capsule Commonly known as:  PRILOSEC Take 20 mg by mouth daily.   Oxycodone HCl 10 MG Tabs Take 10 mg by mouth. Daily at 12 am, 8 am, 12 pm, 4 pm, and 8 pm,   OXYGEN Inhale 2 L into the lungs as needed.   promethazine 12.5 MG tablet Commonly known as:  PHENERGAN Take 12.5 mg by mouth every 6 (six) hours as needed for nausea or vomiting.   sertraline 100 MG tablet  Commonly known as:  ZOLOFT Take 100 mg by mouth daily. Take by mouth daily  along with 25 mg tablet to equal 125 mg total.   sertraline 25 MG tablet Commonly known as:  ZOLOFT Take 25 mg by mouth daily. Along with 100mg  to =125mg    simethicone 80 MG chewable tablet Commonly known as:  MYLICON Chew 263 mg by mouth 3 (three) times daily.   spironolactone 25 MG tablet Commonly known as:  ALDACTONE Take 25 mg by mouth 2 (two) times daily. For edema   SYSTANE ULTRA OP Place 1 drop into both eyes at bedtime as needed. Wait 3 to 5 minutes between 2 eye meds   tiotropium 18 MCG inhalation capsule Commonly known as:  SPIRIVA Inhale 1 capsule (18 MCG CP Handihaler)  contents by taking two separate inhalations via handihaler device daily for COPD   TOUJEO SOLOSTAR 300 UNIT/ML Sopn Generic drug:  Insulin Glargine Inject 128 Units into the skin at bedtime.   VENTOLIN HFA 108 (90 Base) MCG/ACT inhaler Generic drug:  albuterol Inhale 2 puffs into the lungs every 8 (eight) hours as needed for wheezing or shortness of breath.       Review of Systems  Constitutional: Negative for activity change, appetite change, chills, fatigue and fever.  HENT: Negative for congestion, rhinorrhea, sinus pressure, sneezing and sore throat.   Eyes: Negative.   Respiratory: Negative for cough, chest tightness, shortness of breath and wheezing.   Cardiovascular: Negative for chest pain, palpitations and leg swelling.  Gastrointestinal:  Negative for abdominal distention, abdominal pain, constipation, nausea and vomiting.  Endocrine: Negative for cold intolerance, heat intolerance, polydipsia, polyphagia and polyuria.  Genitourinary: Negative for dysuria, flank pain and urgency.  Musculoskeletal: Positive for gait problem.       Foot pain under control with current regimen   Skin: Negative for color change, pallor and rash.  Neurological: Negative for dizziness, seizures, syncope, light-headedness and headaches.  Hematological: Does not bruise/bleed easily.  Psychiatric/Behavioral: Negative.  Negative for agitation, confusion, hallucinations and sleep disturbance. The patient is not nervous/anxious.     Immunization History  Administered Date(s) Administered  . Influenza-Unspecified 06/19/2014, 06/22/2015, 06/19/2016  . PPD Test 12/26/2013, 01/12/2014, 05/18/2015  . Pneumococcal-Unspecified 07/02/2014   Pertinent  Health Maintenance Due  Topic Date Due  . OPHTHALMOLOGY EXAM  09/01/2015  . FOOT EXAM  09/14/2016  . PNA vac Low Risk Adult (1 of 2 - PCV13) 09/11/2017 (Originally 01/09/2016)  . URINE MICROALBUMIN  12/02/2016  . HEMOGLOBIN A1C  03/13/2017  . MAMMOGRAM  12/01/2017  . PAP SMEAR  12/02/2018  . COLONOSCOPY  02/07/2024  . INFLUENZA VACCINE  Completed  . DEXA SCAN  Completed    Vitals:   11/22/16 1133  BP: 136/86  Pulse: 79  Resp: 18  Temp: 98.4 F (36.9 C)  TempSrc: Oral  SpO2: 97%  Weight: 200 lb 3.2 oz (90.8 kg)  Height: 5\' 3"  (1.6 m)   Body mass index is 35.46 kg/m. Physical Exam  Constitutional: She is oriented to person, place, and time. She appears well-developed and well-nourished. No distress.  HENT:  Head: Normocephalic.  Right Ear: External ear normal.  Left Ear: External ear normal.  Mouth/Throat: Oropharynx is clear and moist.  Eyes: Conjunctivae and EOM are normal. Pupils are equal, round, and reactive to light. Right eye exhibits no discharge. Left eye exhibits no discharge. No  scleral icterus.  Neck: Normal range of motion. No JVD present. No thyromegaly present.  Cardiovascular: Normal rate, regular  rhythm, normal heart sounds and intact distal pulses.  Exam reveals no gallop and no friction rub.   No murmur heard. Pulmonary/Chest: Effort normal and breath sounds normal. No respiratory distress. She has no wheezes. She has no rales.  Abdominal: Soft. Bowel sounds are normal. She exhibits no distension and no mass. There is no rebound and no guarding.  Genitourinary:  Genitourinary Comments: Incontinent for both B/B   Musculoskeletal: She exhibits no edema or tenderness.  Left foot drop.Hemiparalysis   Lymphadenopathy:    She has no cervical adenopathy.  Neurological: She is oriented to person, place, and time.  Skin: Skin is warm and dry. No rash noted. No erythema. No pallor.  Psychiatric: She has a normal mood and affect.    Labs reviewed:  Recent Labs  12/13/15 06/27/16  NA 142 140  K 4.3 3.8  BUN 11 14  CREATININE 0.7 0.8    Recent Labs  12/13/15 06/27/16  WBC 10.7 13.7  HGB 10.4* 12.5  HCT 35* 41  PLT 246 266   Lab Results  Component Value Date   TSH 4.06 06/27/2016   Lab Results  Component Value Date   HGBA1C 9.1 09/13/2016   Lab Results  Component Value Date   CHOL 196 06/27/2016   HDL 26 (A) 06/27/2016   LDLCALC 119 06/27/2016   TRIG 255 (A) 06/27/2016   Assessment/Plan   HTN B/p stable. Continue imdur. Monitor BMP  Type 2 DM CBG's ranging in the 120's-upper 200's with occ 300's.continues to be noncompliant with [place] consistent diet/snacks. Continue on Tuojeo, Amryl, Humalog and Tradjenta.continue on ASA and Lipitor.check  Hgb A1C 11/23/2016.  Lab Results  Component Value Date   HGBA1C 9.1 09/13/2016   Hypothyroidism   Lab Results  Component Value Date   TSH 4.06 06/27/2016  Continue on levothyroxine.Check TSH level 11/23/2016.    Anxiety   was wean off Lorazepam by MD but she request to be restarted on Xanax panic  attack.continue to follow up with Psych service recently.  Family/ staff Communication: Reviewed plan of care with patient and facility Nurse supervisor.   Labs/tests ordered: CBC, CMP, TSH level, Hgb A1C and Fasting lipid panel 11/23/2016.

## 2016-11-23 DIAGNOSIS — I10 Essential (primary) hypertension: Secondary | ICD-10-CM | POA: Diagnosis not present

## 2016-11-23 DIAGNOSIS — E039 Hypothyroidism, unspecified: Secondary | ICD-10-CM | POA: Diagnosis not present

## 2016-11-23 LAB — HEPATIC FUNCTION PANEL
ALT: 20 U/L (ref 7–35)
AST: 17 U/L (ref 13–35)
Alkaline Phosphatase: 109 U/L (ref 25–125)
Bilirubin, Total: 0.6 mg/dL

## 2016-11-23 LAB — TSH: TSH: 2.39 u[IU]/mL (ref 0.41–5.90)

## 2016-11-23 LAB — BASIC METABOLIC PANEL
BUN: 11 mg/dL (ref 4–21)
CREATININE: 0.6 mg/dL (ref 0.5–1.1)
GLUCOSE: 264 mg/dL
Potassium: 3.9 mmol/L (ref 3.4–5.3)
Sodium: 137 mmol/L (ref 137–147)

## 2016-11-23 LAB — CBC AND DIFFERENTIAL
HCT: 35 % — AB (ref 36–46)
Hemoglobin: 10.7 g/dL — AB (ref 12.0–16.0)
Platelets: 219 K/µL (ref 150–399)
WBC: 7.2 10*3/mL

## 2016-11-23 LAB — LIPID PANEL
Cholesterol: 193 mg/dL (ref 0–200)
HDL: 22 mg/dL — AB (ref 35–70)
LDL Cholesterol: 136 mg/dL
Triglycerides: 174 mg/dL — AB (ref 40–160)

## 2016-11-24 DIAGNOSIS — R278 Other lack of coordination: Secondary | ICD-10-CM | POA: Diagnosis not present

## 2016-11-24 DIAGNOSIS — R293 Abnormal posture: Secondary | ICD-10-CM | POA: Diagnosis not present

## 2016-11-27 DIAGNOSIS — R293 Abnormal posture: Secondary | ICD-10-CM | POA: Diagnosis not present

## 2016-11-27 DIAGNOSIS — R278 Other lack of coordination: Secondary | ICD-10-CM | POA: Diagnosis not present

## 2016-11-28 DIAGNOSIS — R278 Other lack of coordination: Secondary | ICD-10-CM | POA: Diagnosis not present

## 2016-11-28 DIAGNOSIS — R293 Abnormal posture: Secondary | ICD-10-CM | POA: Diagnosis not present

## 2016-11-29 DIAGNOSIS — R293 Abnormal posture: Secondary | ICD-10-CM | POA: Diagnosis not present

## 2016-11-29 DIAGNOSIS — R278 Other lack of coordination: Secondary | ICD-10-CM | POA: Diagnosis not present

## 2016-11-30 DIAGNOSIS — R278 Other lack of coordination: Secondary | ICD-10-CM | POA: Diagnosis not present

## 2016-11-30 DIAGNOSIS — R293 Abnormal posture: Secondary | ICD-10-CM | POA: Diagnosis not present

## 2016-12-01 DIAGNOSIS — R278 Other lack of coordination: Secondary | ICD-10-CM | POA: Diagnosis not present

## 2016-12-01 DIAGNOSIS — E1169 Type 2 diabetes mellitus with other specified complication: Secondary | ICD-10-CM | POA: Diagnosis not present

## 2016-12-01 DIAGNOSIS — Z885 Allergy status to narcotic agent status: Secondary | ICD-10-CM | POA: Diagnosis not present

## 2016-12-01 DIAGNOSIS — J449 Chronic obstructive pulmonary disease, unspecified: Secondary | ICD-10-CM | POA: Diagnosis not present

## 2016-12-01 DIAGNOSIS — Z01818 Encounter for other preprocedural examination: Secondary | ICD-10-CM | POA: Diagnosis not present

## 2016-12-01 DIAGNOSIS — R293 Abnormal posture: Secondary | ICD-10-CM | POA: Diagnosis not present

## 2016-12-01 DIAGNOSIS — I10 Essential (primary) hypertension: Secondary | ICD-10-CM | POA: Diagnosis not present

## 2016-12-01 DIAGNOSIS — Z87891 Personal history of nicotine dependence: Secondary | ICD-10-CM | POA: Diagnosis not present

## 2016-12-01 DIAGNOSIS — E7800 Pure hypercholesterolemia, unspecified: Secondary | ICD-10-CM | POA: Diagnosis not present

## 2016-12-01 DIAGNOSIS — E119 Type 2 diabetes mellitus without complications: Secondary | ICD-10-CM | POA: Diagnosis not present

## 2016-12-01 DIAGNOSIS — Z888 Allergy status to other drugs, medicaments and biological substances status: Secondary | ICD-10-CM | POA: Diagnosis not present

## 2016-12-01 DIAGNOSIS — Z9114 Patient's other noncompliance with medication regimen: Secondary | ICD-10-CM | POA: Diagnosis not present

## 2016-12-01 DIAGNOSIS — H25811 Combined forms of age-related cataract, right eye: Secondary | ICD-10-CM | POA: Diagnosis not present

## 2016-12-01 DIAGNOSIS — H25813 Combined forms of age-related cataract, bilateral: Secondary | ICD-10-CM | POA: Diagnosis not present

## 2016-12-01 DIAGNOSIS — Z9981 Dependence on supplemental oxygen: Secondary | ICD-10-CM | POA: Diagnosis not present

## 2016-12-01 DIAGNOSIS — Z8673 Personal history of transient ischemic attack (TIA), and cerebral infarction without residual deficits: Secondary | ICD-10-CM | POA: Diagnosis not present

## 2016-12-01 DIAGNOSIS — E785 Hyperlipidemia, unspecified: Secondary | ICD-10-CM | POA: Diagnosis not present

## 2016-12-01 DIAGNOSIS — Z7982 Long term (current) use of aspirin: Secondary | ICD-10-CM | POA: Diagnosis not present

## 2016-12-01 DIAGNOSIS — I2510 Atherosclerotic heart disease of native coronary artery without angina pectoris: Secondary | ICD-10-CM | POA: Diagnosis not present

## 2016-12-01 DIAGNOSIS — I509 Heart failure, unspecified: Secondary | ICD-10-CM | POA: Diagnosis not present

## 2016-12-01 DIAGNOSIS — Z794 Long term (current) use of insulin: Secondary | ICD-10-CM | POA: Diagnosis not present

## 2016-12-01 DIAGNOSIS — E039 Hypothyroidism, unspecified: Secondary | ICD-10-CM | POA: Diagnosis not present

## 2016-12-01 DIAGNOSIS — E669 Obesity, unspecified: Secondary | ICD-10-CM | POA: Diagnosis not present

## 2016-12-01 DIAGNOSIS — Z79899 Other long term (current) drug therapy: Secondary | ICD-10-CM | POA: Diagnosis not present

## 2016-12-01 DIAGNOSIS — E1165 Type 2 diabetes mellitus with hyperglycemia: Secondary | ICD-10-CM | POA: Diagnosis not present

## 2016-12-04 DIAGNOSIS — G894 Chronic pain syndrome: Secondary | ICD-10-CM | POA: Diagnosis not present

## 2016-12-07 DIAGNOSIS — E669 Obesity, unspecified: Secondary | ICD-10-CM | POA: Diagnosis not present

## 2016-12-07 DIAGNOSIS — E1169 Type 2 diabetes mellitus with other specified complication: Secondary | ICD-10-CM | POA: Diagnosis not present

## 2016-12-07 DIAGNOSIS — E1165 Type 2 diabetes mellitus with hyperglycemia: Secondary | ICD-10-CM | POA: Diagnosis not present

## 2016-12-07 DIAGNOSIS — E1136 Type 2 diabetes mellitus with diabetic cataract: Secondary | ICD-10-CM | POA: Diagnosis not present

## 2016-12-07 DIAGNOSIS — R569 Unspecified convulsions: Secondary | ICD-10-CM | POA: Diagnosis not present

## 2016-12-07 DIAGNOSIS — F339 Major depressive disorder, recurrent, unspecified: Secondary | ICD-10-CM | POA: Diagnosis not present

## 2016-12-07 DIAGNOSIS — E7800 Pure hypercholesterolemia, unspecified: Secondary | ICD-10-CM | POA: Diagnosis not present

## 2016-12-07 DIAGNOSIS — D649 Anemia, unspecified: Secondary | ICD-10-CM | POA: Diagnosis not present

## 2016-12-07 DIAGNOSIS — I69354 Hemiplegia and hemiparesis following cerebral infarction affecting left non-dominant side: Secondary | ICD-10-CM | POA: Diagnosis not present

## 2016-12-07 DIAGNOSIS — G4730 Sleep apnea, unspecified: Secondary | ICD-10-CM | POA: Diagnosis not present

## 2016-12-07 DIAGNOSIS — I509 Heart failure, unspecified: Secondary | ICD-10-CM | POA: Diagnosis not present

## 2016-12-07 DIAGNOSIS — Z6830 Body mass index (BMI) 30.0-30.9, adult: Secondary | ICD-10-CM | POA: Diagnosis not present

## 2016-12-07 DIAGNOSIS — J449 Chronic obstructive pulmonary disease, unspecified: Secondary | ICD-10-CM | POA: Diagnosis not present

## 2016-12-07 DIAGNOSIS — H25811 Combined forms of age-related cataract, right eye: Secondary | ICD-10-CM | POA: Diagnosis not present

## 2016-12-07 DIAGNOSIS — I110 Hypertensive heart disease with heart failure: Secondary | ICD-10-CM | POA: Diagnosis not present

## 2016-12-07 DIAGNOSIS — F419 Anxiety disorder, unspecified: Secondary | ICD-10-CM | POA: Diagnosis not present

## 2016-12-07 DIAGNOSIS — Z955 Presence of coronary angioplasty implant and graft: Secondary | ICD-10-CM | POA: Diagnosis not present

## 2016-12-07 DIAGNOSIS — G9050 Complex regional pain syndrome I, unspecified: Secondary | ICD-10-CM | POA: Diagnosis not present

## 2016-12-07 DIAGNOSIS — G2581 Restless legs syndrome: Secondary | ICD-10-CM | POA: Diagnosis not present

## 2016-12-07 DIAGNOSIS — H25813 Combined forms of age-related cataract, bilateral: Secondary | ICD-10-CM | POA: Diagnosis not present

## 2016-12-07 DIAGNOSIS — Z9981 Dependence on supplemental oxygen: Secondary | ICD-10-CM | POA: Diagnosis not present

## 2016-12-07 DIAGNOSIS — E039 Hypothyroidism, unspecified: Secondary | ICD-10-CM | POA: Diagnosis not present

## 2016-12-07 DIAGNOSIS — Z87891 Personal history of nicotine dependence: Secondary | ICD-10-CM | POA: Diagnosis not present

## 2016-12-07 DIAGNOSIS — I2510 Atherosclerotic heart disease of native coronary artery without angina pectoris: Secondary | ICD-10-CM | POA: Diagnosis not present

## 2016-12-07 DIAGNOSIS — E785 Hyperlipidemia, unspecified: Secondary | ICD-10-CM | POA: Diagnosis not present

## 2016-12-07 DIAGNOSIS — E1140 Type 2 diabetes mellitus with diabetic neuropathy, unspecified: Secondary | ICD-10-CM | POA: Diagnosis not present

## 2016-12-07 HISTORY — PX: CATARACT EXTRACTION: SUR2

## 2016-12-08 DIAGNOSIS — Z48810 Encounter for surgical aftercare following surgery on the sense organs: Secondary | ICD-10-CM | POA: Diagnosis not present

## 2016-12-08 DIAGNOSIS — E1136 Type 2 diabetes mellitus with diabetic cataract: Secondary | ICD-10-CM | POA: Diagnosis not present

## 2016-12-08 DIAGNOSIS — Z888 Allergy status to other drugs, medicaments and biological substances status: Secondary | ICD-10-CM | POA: Diagnosis not present

## 2016-12-08 DIAGNOSIS — Z881 Allergy status to other antibiotic agents status: Secondary | ICD-10-CM | POA: Diagnosis not present

## 2016-12-08 DIAGNOSIS — G4730 Sleep apnea, unspecified: Secondary | ICD-10-CM | POA: Diagnosis not present

## 2016-12-08 DIAGNOSIS — I110 Hypertensive heart disease with heart failure: Secondary | ICD-10-CM | POA: Diagnosis not present

## 2016-12-08 DIAGNOSIS — E7800 Pure hypercholesterolemia, unspecified: Secondary | ICD-10-CM | POA: Diagnosis not present

## 2016-12-08 DIAGNOSIS — E1151 Type 2 diabetes mellitus with diabetic peripheral angiopathy without gangrene: Secondary | ICD-10-CM | POA: Diagnosis not present

## 2016-12-08 DIAGNOSIS — I509 Heart failure, unspecified: Secondary | ICD-10-CM | POA: Diagnosis not present

## 2016-12-08 DIAGNOSIS — E1165 Type 2 diabetes mellitus with hyperglycemia: Secondary | ICD-10-CM | POA: Diagnosis not present

## 2016-12-08 DIAGNOSIS — E039 Hypothyroidism, unspecified: Secondary | ICD-10-CM | POA: Diagnosis not present

## 2016-12-08 DIAGNOSIS — Z961 Presence of intraocular lens: Secondary | ICD-10-CM | POA: Diagnosis not present

## 2016-12-08 DIAGNOSIS — E785 Hyperlipidemia, unspecified: Secondary | ICD-10-CM | POA: Diagnosis not present

## 2016-12-08 DIAGNOSIS — Z885 Allergy status to narcotic agent status: Secondary | ICD-10-CM | POA: Diagnosis not present

## 2016-12-08 DIAGNOSIS — I2510 Atherosclerotic heart disease of native coronary artery without angina pectoris: Secondary | ICD-10-CM | POA: Diagnosis not present

## 2016-12-08 DIAGNOSIS — E876 Hypokalemia: Secondary | ICD-10-CM | POA: Diagnosis not present

## 2016-12-08 DIAGNOSIS — Z8673 Personal history of transient ischemic attack (TIA), and cerebral infarction without residual deficits: Secondary | ICD-10-CM | POA: Diagnosis not present

## 2016-12-08 DIAGNOSIS — Z7982 Long term (current) use of aspirin: Secondary | ICD-10-CM | POA: Diagnosis not present

## 2016-12-08 DIAGNOSIS — Z955 Presence of coronary angioplasty implant and graft: Secondary | ICD-10-CM | POA: Diagnosis not present

## 2016-12-08 DIAGNOSIS — H1820 Unspecified corneal edema: Secondary | ICD-10-CM | POA: Diagnosis not present

## 2016-12-08 DIAGNOSIS — Z90710 Acquired absence of both cervix and uterus: Secondary | ICD-10-CM | POA: Diagnosis not present

## 2016-12-08 DIAGNOSIS — Z87891 Personal history of nicotine dependence: Secondary | ICD-10-CM | POA: Diagnosis not present

## 2016-12-08 DIAGNOSIS — H2512 Age-related nuclear cataract, left eye: Secondary | ICD-10-CM | POA: Diagnosis not present

## 2016-12-08 DIAGNOSIS — Z794 Long term (current) use of insulin: Secondary | ICD-10-CM | POA: Diagnosis not present

## 2016-12-08 DIAGNOSIS — Z9841 Cataract extraction status, right eye: Secondary | ICD-10-CM | POA: Diagnosis not present

## 2016-12-12 DIAGNOSIS — E1165 Type 2 diabetes mellitus with hyperglycemia: Secondary | ICD-10-CM | POA: Diagnosis not present

## 2016-12-12 LAB — HEMOGLOBIN A1C: Hemoglobin A1C: 8.3

## 2016-12-13 ENCOUNTER — Encounter: Payer: Self-pay | Admitting: Family

## 2016-12-13 ENCOUNTER — Non-Acute Institutional Stay (SKILLED_NURSING_FACILITY): Payer: Medicare Other | Admitting: Family

## 2016-12-13 DIAGNOSIS — M25622 Stiffness of left elbow, not elsewhere classified: Secondary | ICD-10-CM

## 2016-12-13 NOTE — Progress Notes (Signed)
Patient ID: Susan [name], female   DOB: December 13, 1950, 66 y.o.   MRN: [id number]  Location:  [place] Room Number: [place] of Service:  SNF (31) Provider: Jocob Dambach FNP-C   Blanchie Serve, MD  Patient Care Team: Blanchie Serve, MD as PCP - General (Internal Medicine) Sandrea Hughs, NP as Nurse Practitioner (Family Medicine)  Extended Emergency Contact Information Primary Emergency Contact: Roblstow,Beth Address: [address]          [place], [place] [address] Johnnette Litter of [place] Phone: [phone] Work Phone: [phone] Mobile Phone: [phone] Relation: Daughter Secondary Emergency Contact: Arterberry,Scott Address: [address]          [place], [place] [address] Johnnette Litter of [place] Phone: [phone] Mobile Phone: [phone] Relation: Spouse  Code Status: Full code  Goals of care: Advanced Directive information Advanced Directives 11/22/2016  Does Patient Have a Medical Advance Directive? No  Type of Advance Directive -  Does patient want to make changes to medical advance directive? -  Copy of [place] in Chart? -  Would patient like information on creating a medical advance directive? No - Patient declined     Chief Complaint  Patient presents with  . Acute Visit    Not able to lift/bend left arm     HPI:  Pt is a 66 y.o. female seen today at [facility] for evaluation inability to lift/bend left arm for the past two days.She has a significant medical history of  CVA with left hemiparesis among other conditions. She is seen in her room today. She states was unable to bend or lift arm for the past two days but much better today. She denies any redness, swelling,pain or injury to arm though limited due to history of CVA.   Past Medical History:  Diagnosis Date  . Anemia   . CAD (coronary artery disease)   . Chronic pain   . COPD (chronic obstructive pulmonary  disease) ([place])   . Depression   . Diabetes mellitus without complication ([place])   . Fall   . Hemiplegia affecting non-dominant side, post-stroke ([place])   . Hyperlipidemia   . Hypertension   . Stroke ([place]) 12/2013   left side paralysis  . Thyroid disease   . Urine retention    Past Surgical History:  Procedure Laterality Date  . ABDOMINAL HYSTERECTOMY    . APPENDECTOMY    . CATARACT EXTRACTION Right 12/07/2016  . CHOLECYSTECTOMY    . JOINT REPLACEMENT     bil hip  . TONSILLECTOMY      Allergies  Allergen Reactions  . Onion Shortness Of Breath and Swelling  . Pollen Extract Itching  . Morphine And Related Hives    PT DENIES ALLERGY  . Niaspan [Niacin Er] Other (See Comments)    unknown  . Other     onions  . Trazodone And Nefazodone Other (See Comments)    Hallucinations  . Valium [Diazepam] Other (See Comments)    unknown  . Wellbutrin [Bupropion] Other (See Comments)    unknown    Allergies as of 12/13/2016      Reactions   Onion Shortness Of Breath, Swelling   Pollen Extract Itching   Morphine And Related Hives   PT DENIES ALLERGY   Niaspan [niacin Er] Other (See Comments)   unknown   Other    onions   Trazodone And Nefazodone Other (See Comments)  Hallucinations   Valium [diazepam] Other (See Comments)   unknown   Wellbutrin [bupropion] Other (See Comments)   unknown      Medication List       Accurate as of 12/13/16  5:04 PM. Always use your most recent med list.          acetaminophen 325 MG tablet Commonly known as:  TYLENOL Take 650 mg by mouth every 4 (four) hours as needed for mild pain.   aspirin 81 MG chewable tablet Chew 81 mg by mouth daily.   atorvastatin 10 MG tablet Commonly known as:  LIPITOR Take 10 mg by mouth daily.   baclofen 10 MG tablet Commonly known as:  LIORESAL Take 10 mg by mouth 3 (three) times daily. For muscle spasms   BIOFREEZE 4 % Gel Generic drug:  Menthol (Topical Analgesic) Apply topically 4 (four) times  daily. Apply to both feet.   cyclobenzaprine 5 MG tablet Commonly known as:  FLEXERIL Take 5 mg by mouth 2 (two) times daily.   diphenhydrAMINE 25 MG tablet Commonly known as:  BENADRYL Take 25 mg by mouth 4 (four) times daily. While awake for sleep   docusate sodium 100 MG capsule Commonly known as:  COLACE Take 2 capsules (200 mg total) by mouth 2 (two) times daily.   fluticasone 50 MCG/ACT nasal spray Commonly known as:  FLONASE Place 2 sprays into both nostrils daily. For chronic sinus infections   furosemide 20 MG tablet Commonly known as:  LASIX Take 20 mg by mouth daily.   gabapentin 400 MG capsule Commonly known as:  NEURONTIN Take 1 (400 mg) capsule by mouth three times a day with meals. Also take 3 (1200mg ) capsules by mouth daily at bedtime.   glimepiride 2 MG tablet Commonly known as:  AMARYL Take 2 mg by mouth daily with breakfast.   hydrocortisone 2.5 % lotion Apply 1 application topically 2 (two) times daily as needed.   insulin lispro 100 UNIT/ML injection Commonly known as:  HUMALOG Inject 36 Units into the skin 3 (three) times daily before meals. For CBG >/= 150 inject 10 additional units subcutaneously   isosorbide mononitrate 30 MG 24 hr tablet Commonly known as:  IMDUR Take 30 mg by mouth daily.   levothyroxine 25 MCG tablet Commonly known as:  SYNTHROID, LEVOTHROID Take 25 mcg by mouth daily before breakfast.   linaclotide 145 MCG Caps capsule Commonly known as:  LINZESS Take 145 mcg by mouth daily. Give 30 minutes prior to first meal and taken on an empty stomach.   linagliptin 5 MG Tabs tablet Commonly known as:  TRADJENTA Take 5 mg by mouth daily.   Melatonin 3 MG Tabs Take 6 mg by mouth at bedtime.   metFORMIN 1000 MG tablet Commonly known as:  GLUCOPHAGE Take 1,000 mg by mouth 2 (two) times daily with a meal.   moxifloxacin 0.5 % ophthalmic solution Commonly known as:  VIGAMOX Place 1 drop into the right eye 4 (four) times  daily. While awake   nitroGLYCERIN 0.4 MG SL tablet Commonly known as:  NITROSTAT Place 0.4 mg under the tongue every 5 (five) minutes as needed for chest pain. Notify MD if not relieved by NTG   omeprazole 20 MG capsule Commonly known as:  PRILOSEC Take 20 mg by mouth daily.   Oxycodone HCl 10 MG Tabs Take 10 mg by mouth. Daily at 12 am, 8 am, 12 pm, 4 pm, and 8 pm,   OXYGEN Inhale 2 L into  the lungs as needed.   prednisoLONE acetate 1 % ophthalmic suspension Commonly known as:  PRED FORTE Place 1 drop into the right eye 4 (four) times daily. While awake   promethazine 12.5 MG tablet Commonly known as:  PHENERGAN Take 12.5 mg by mouth every 6 (six) hours as needed for nausea or vomiting.   sertraline 100 MG tablet Commonly known as:  ZOLOFT Take 100 mg by mouth daily. Take by mouth daily  along with 25 mg tablet to equal 125 mg total.   sertraline 25 MG tablet Commonly known as:  ZOLOFT Take 25 mg by mouth daily. Along with 100mg  to =125mg    spironolactone 25 MG tablet Commonly known as:  ALDACTONE Take 25 mg by mouth 2 (two) times daily. For edema   SYSTANE ULTRA OP Place 1 drop into both eyes at bedtime as needed. Wait 3 to 5 minutes between 2 eye meds   tiotropium 18 MCG inhalation capsule Commonly known as:  SPIRIVA Inhale 1 capsule (18 MCG CP Handihaler)  contents by taking two separate inhalations via handihaler device daily for COPD   TOUJEO SOLOSTAR 300 UNIT/ML Sopn Generic drug:  Insulin Glargine Inject 128 Units into the skin at bedtime.   VENTOLIN HFA 108 (90 Base) MCG/ACT inhaler Generic drug:  albuterol Inhale 2 puffs into the lungs every 8 (eight) hours as needed for wheezing or shortness of breath.       Review of Systems  Constitutional: Negative for activity change, appetite change, chills, fatigue and fever.  HENT: Negative for congestion, rhinorrhea, sinus pressure, sneezing and sore throat.   Eyes: Negative.   Respiratory: Negative for  cough, chest tightness, shortness of breath and wheezing.   Cardiovascular: Negative for chest pain, palpitations and leg swelling.  Gastrointestinal: Negative for abdominal distention, abdominal pain, constipation, nausea and vomiting.  Musculoskeletal: Positive for gait problem.       Left arm stiffness   Skin: Negative for color change, pallor and rash.  Neurological: Negative for dizziness, seizures, syncope, light-headedness and headaches.  Psychiatric/Behavioral: Negative.  Negative for agitation, confusion, hallucinations and sleep disturbance. The patient is not nervous/anxious.     Immunization History  Administered Date(s) Administered  . Influenza-Unspecified 06/19/2014, 06/22/2015, 06/19/2016  . PPD Test 12/26/2013, 01/12/2014, 05/18/2015  . Pneumococcal-Unspecified 07/02/2014   Pertinent  Health Maintenance Due  Topic Date Due  . OPHTHALMOLOGY EXAM  09/01/2015  . FOOT EXAM  09/14/2016  . URINE MICROALBUMIN  12/02/2016  . PNA vac Low Risk Adult (1 of 2 - PCV13) 09/11/2017 (Originally 01/09/2016)  . HEMOGLOBIN A1C  03/13/2017  . INFLUENZA VACCINE  04/11/2017  . MAMMOGRAM  12/01/2017  . PAP SMEAR  12/02/2018  . COLONOSCOPY  02/07/2024  . DEXA SCAN  Completed    Vitals:   12/13/16 1316  BP: (!) 110/54  Pulse: (!) 53  Resp: 20  Temp: 98.5 F (36.9 C)  SpO2: 96%  Weight: 200 lb 3.2 oz (90.8 kg)  Height: 5\' 3"  (1.6 m)   Body mass index is 35.46 kg/m. Physical Exam  Constitutional: She is oriented to person, place, and time. She appears well-developed and well-nourished. No distress.  HENT:  Head: Normocephalic.  Right Ear: External ear normal.  Left Ear: External ear normal.  Mouth/Throat: Oropharynx is clear and moist.  Eyes: Conjunctivae and EOM are normal. Pupils are equal, round, and reactive to light. Right eye exhibits no discharge. Left eye exhibits no discharge. No scleral icterus.  Neck: Normal range of motion. No JVD  present. No thyromegaly present.    Cardiovascular: Normal rate, regular rhythm, normal heart sounds and intact distal pulses.  Exam reveals no gallop and no friction rub.   No murmur heard. Pulmonary/Chest: Effort normal and breath sounds normal. No respiratory distress. She has no wheezes. She has no rales.  Abdominal: Soft. Bowel sounds are normal. She exhibits no distension and no mass. There is no rebound and no guarding.  Genitourinary:  Genitourinary Comments: Incontinent for both B/B   Musculoskeletal: She exhibits no edema or tenderness.  Left foot drop.Hemiparalysis left arm passive ROM.   Lymphadenopathy:    She has no cervical adenopathy.  Neurological: She is oriented to person, place, and time.  Skin: Skin is warm and dry. No rash noted. No erythema. No pallor.  Psychiatric: She has a normal mood and affect.    Labs reviewed:  Recent Labs  06/27/16 11/23/16 1342  NA 140 137  K 3.8 3.9  BUN 14 11  CREATININE 0.8 0.6    Recent Labs  06/27/16 11/23/16 1342  WBC 13.7 7.2  HGB 12.5 10.7*  HCT 41 35*  PLT 266 219   Lab Results  Component Value Date   TSH 2.39 11/23/2016   Lab Results  Component Value Date   HGBA1C 8.3 12/12/2016   Lab Results  Component Value Date   CHOL 193 11/23/2016   HDL 22 (A) 11/23/2016   LDLCALC 136 11/23/2016   TRIG 174 (A) 11/23/2016   Assessment/Plan  Left arm stiffness  Hemiparesis.Worsening stiffness x 2 days.Splint in place.Passive ROM completed. Continue on baclofen three times daily.Consult PT/OT to evaluate and treat as indicated for ROM, exercise and muscle strengthening.Continue to monitor.   Family/ staff Communication: Reviewed plan of care with patient and facility Nurse supervisor.   Labs/tests ordered: None

## 2016-12-22 DIAGNOSIS — E1169 Type 2 diabetes mellitus with other specified complication: Secondary | ICD-10-CM | POA: Diagnosis not present

## 2016-12-22 DIAGNOSIS — Z7982 Long term (current) use of aspirin: Secondary | ICD-10-CM | POA: Diagnosis not present

## 2016-12-22 DIAGNOSIS — E1149 Type 2 diabetes mellitus with other diabetic neurological complication: Secondary | ICD-10-CM | POA: Diagnosis not present

## 2016-12-22 DIAGNOSIS — I110 Hypertensive heart disease with heart failure: Secondary | ICD-10-CM | POA: Diagnosis not present

## 2016-12-22 DIAGNOSIS — E785 Hyperlipidemia, unspecified: Secondary | ICD-10-CM | POA: Diagnosis not present

## 2016-12-22 DIAGNOSIS — J449 Chronic obstructive pulmonary disease, unspecified: Secondary | ICD-10-CM | POA: Diagnosis not present

## 2016-12-22 DIAGNOSIS — Z48810 Encounter for surgical aftercare following surgery on the sense organs: Secondary | ICD-10-CM | POA: Diagnosis not present

## 2016-12-22 DIAGNOSIS — Z961 Presence of intraocular lens: Secondary | ICD-10-CM | POA: Diagnosis not present

## 2016-12-22 DIAGNOSIS — Z9841 Cataract extraction status, right eye: Secondary | ICD-10-CM | POA: Diagnosis not present

## 2016-12-22 DIAGNOSIS — E7800 Pure hypercholesterolemia, unspecified: Secondary | ICD-10-CM | POA: Diagnosis not present

## 2016-12-22 DIAGNOSIS — E039 Hypothyroidism, unspecified: Secondary | ICD-10-CM | POA: Diagnosis not present

## 2016-12-22 DIAGNOSIS — Z794 Long term (current) use of insulin: Secondary | ICD-10-CM | POA: Diagnosis not present

## 2016-12-22 DIAGNOSIS — Z8673 Personal history of transient ischemic attack (TIA), and cerebral infarction without residual deficits: Secondary | ICD-10-CM | POA: Diagnosis not present

## 2016-12-22 DIAGNOSIS — I509 Heart failure, unspecified: Secondary | ICD-10-CM | POA: Diagnosis not present

## 2016-12-22 DIAGNOSIS — I2510 Atherosclerotic heart disease of native coronary artery without angina pectoris: Secondary | ICD-10-CM | POA: Diagnosis not present

## 2016-12-22 DIAGNOSIS — Z79899 Other long term (current) drug therapy: Secondary | ICD-10-CM | POA: Diagnosis not present

## 2016-12-22 DIAGNOSIS — E669 Obesity, unspecified: Secondary | ICD-10-CM | POA: Diagnosis not present

## 2016-12-22 DIAGNOSIS — Z888 Allergy status to other drugs, medicaments and biological substances status: Secondary | ICD-10-CM | POA: Diagnosis not present

## 2016-12-22 DIAGNOSIS — Z885 Allergy status to narcotic agent status: Secondary | ICD-10-CM | POA: Diagnosis not present

## 2016-12-22 DIAGNOSIS — Z87891 Personal history of nicotine dependence: Secondary | ICD-10-CM | POA: Diagnosis not present

## 2016-12-22 DIAGNOSIS — Z955 Presence of coronary angioplasty implant and graft: Secondary | ICD-10-CM | POA: Diagnosis not present

## 2016-12-22 DIAGNOSIS — Z884 Allergy status to anesthetic agent status: Secondary | ICD-10-CM | POA: Diagnosis not present

## 2016-12-26 ENCOUNTER — Non-Acute Institutional Stay (SKILLED_NURSING_FACILITY): Payer: Medicare Other | Admitting: Family

## 2016-12-26 ENCOUNTER — Encounter: Payer: Self-pay | Admitting: Family

## 2016-12-26 DIAGNOSIS — E1140 Type 2 diabetes mellitus with diabetic neuropathy, unspecified: Secondary | ICD-10-CM

## 2016-12-26 DIAGNOSIS — Z794 Long term (current) use of insulin: Secondary | ICD-10-CM | POA: Diagnosis not present

## 2016-12-26 DIAGNOSIS — E1165 Type 2 diabetes mellitus with hyperglycemia: Secondary | ICD-10-CM

## 2016-12-26 DIAGNOSIS — Z9841 Cataract extraction status, right eye: Secondary | ICD-10-CM | POA: Diagnosis not present

## 2016-12-26 DIAGNOSIS — E039 Hypothyroidism, unspecified: Secondary | ICD-10-CM

## 2016-12-26 DIAGNOSIS — I110 Hypertensive heart disease with heart failure: Secondary | ICD-10-CM | POA: Diagnosis not present

## 2016-12-26 DIAGNOSIS — IMO0002 Reserved for concepts with insufficient information to code with codable children: Secondary | ICD-10-CM

## 2016-12-26 NOTE — Progress Notes (Signed)
Patient ID: Susan [name], female   DOB: 12-29-1950, 66 y.o.   MRN: [id number]  Location:  [facility] Room Number: [place] of Service:  SNF (31) Provider: Dinah Ngetich FNP-C   Blanchie Serve, MD  Patient Care Team: Blanchie Serve, MD as PCP - General (Internal Medicine) Sandrea Hughs, NP as Nurse Practitioner (Family Medicine)  Extended Emergency Contact Information Primary Emergency Contact: Roblstow,Beth Address: [address]          [place], [place] [address] Johnnette Litter of [place] Phone: [phone] Work Phone: [phone] Mobile Phone: [phone] Relation: Daughter Secondary Emergency Contact: Mena,Scott Address: [address]          [place], [place] [address] Johnnette Litter of [place] Phone: [phone] Mobile Phone: [phone] Relation: Spouse  Code Status: Full code  Goals of care: Advanced Directive information Advanced Directives 12/26/2016  Does Patient Have a Medical Advance Directive? No  Type of Advance Directive -  Does patient want to make changes to medical advance directive? -  Copy of [place] in Chart? -  Would patient like information on creating a medical advance directive? No - Patient declined     Chief Complaint  Patient presents with  . Medical Management of Chronic Issues    Routine Visit    HPI:  Pt is a 66 y.o. female seen today at [facility] for medical management of chronic diseases. She has a medical history of Type 2 DM, HTN, CHF, COPD, CAD, CVA, Hypothyroidism, Depression among other conditions. She is seen in her room today.she had right eye cataract surgery 12/07/2016 at [facility] with Dr. Otto Herb. She denies any acute issues with right eye. She is currently on Prednisone 1% and Moxifloxacin 0.5 % eye drop no duration noted.Facility staff reports no new concerns.   Past Medical History:  Diagnosis  Date  . Anemia   . CAD (coronary artery disease)   . Chronic pain   . COPD (chronic obstructive pulmonary disease) ([place])   . Depression   . Diabetes mellitus without complication ([place])   . Fall   . Hemiplegia affecting non-dominant side, post-stroke   . Hyperlipidemia   . Hypertension   . Stroke ([place]) 12/2013   left side paralysis  . Thyroid disease   . Urine retention    Past Surgical History:  Procedure Laterality Date  . ABDOMINAL HYSTERECTOMY    . APPENDECTOMY    . CATARACT EXTRACTION Right 12/07/2016  . CHOLECYSTECTOMY    . JOINT REPLACEMENT     bil hip  . TONSILLECTOMY      Allergies  Allergen Reactions  . Onion Shortness Of Breath and Swelling  . Pollen Extract Itching  . Morphine And Related Hives    PT DENIES ALLERGY  . Niaspan [Niacin Er] Other (See Comments)    unknown  . Other     onions  . Trazodone And Nefazodone Other (See Comments)    Hallucinations  . Valium [Diazepam] Other (See Comments)    unknown  . Wellbutrin [Bupropion] Other (See Comments)    unknown    Allergies as of 12/26/2016      Reactions   Onion Shortness Of Breath, Swelling   Pollen Extract Itching   Morphine And Related Hives   PT DENIES ALLERGY   Niaspan [niacin Er] Other (See Comments)   unknown   Other    onions  Trazodone And Nefazodone Other (See Comments)   Hallucinations   Valium [diazepam] Other (See Comments)   unknown   Wellbutrin [bupropion] Other (See Comments)   unknown      Medication List       Accurate as of 12/26/16 12:46 PM. Always use your most recent med list.          acetaminophen 325 MG tablet Commonly known as:  TYLENOL Take 650 mg by mouth every 4 (four) hours as needed for mild pain.   aspirin 81 MG chewable tablet Chew 81 mg by mouth daily.   atorvastatin 10 MG tablet Commonly known as:  LIPITOR Take 10 mg by mouth daily.   baclofen 10 MG tablet Commonly known as:  LIORESAL Take 10 mg by mouth 3 (three) times daily. For muscle  spasms   BIOFREEZE 4 % Gel Generic drug:  Menthol (Topical Analgesic) Apply topically 4 (four) times daily. Apply to both feet.   cyclobenzaprine 5 MG tablet Commonly known as:  FLEXERIL Take 5 mg by mouth 2 (two) times daily.   diphenhydrAMINE 25 MG tablet Commonly known as:  BENADRYL Take 25 mg by mouth 4 (four) times daily. While awake for sleep   docusate sodium 100 MG capsule Commonly known as:  COLACE Take 2 capsules (200 mg total) by mouth 2 (two) times daily.   fluticasone 50 MCG/ACT nasal spray Commonly known as:  FLONASE Place 2 sprays into both nostrils daily. For chronic sinus infections   furosemide 20 MG tablet Commonly known as:  LASIX Take 20 mg by mouth daily.   gabapentin 400 MG capsule Commonly known as:  NEURONTIN Take 1 (400 mg) capsule by mouth three times a day with meals. Also take 3 (1200mg ) capsules by mouth daily at bedtime.   glimepiride 2 MG tablet Commonly known as:  AMARYL Take 2 mg by mouth daily with breakfast.   hydrocortisone 2.5 % lotion Apply 1 application topically 2 (two) times daily as needed.   insulin lispro 100 UNIT/ML injection Commonly known as:  HUMALOG Inject 36 Units into the skin 3 (three) times daily before meals. For CBG >/= 150 inject 10 additional units subcutaneously   isosorbide mononitrate 30 MG 24 hr tablet Commonly known as:  IMDUR Take 30 mg by mouth daily.   levothyroxine 25 MCG tablet Commonly known as:  SYNTHROID, LEVOTHROID Take 25 mcg by mouth daily before breakfast.   linaclotide 145 MCG Caps capsule Commonly known as:  LINZESS Take 145 mcg by mouth daily. Give 30 minutes prior to first meal and taken on an empty stomach.   linagliptin 5 MG Tabs tablet Commonly known as:  TRADJENTA Take 5 mg by mouth daily.   Melatonin 3 MG Tabs Take 6 mg by mouth at bedtime.   metFORMIN 1000 MG tablet Commonly known as:  GLUCOPHAGE Take 1,000 mg by mouth 2 (two) times daily with a meal.   moxifloxacin 0.5  % ophthalmic solution Commonly known as:  VIGAMOX Place 1 drop into the right eye 4 (four) times daily. While awake   nitroGLYCERIN 0.4 MG SL tablet Commonly known as:  NITROSTAT Place 0.4 mg under the tongue every 5 (five) minutes as needed for chest pain. Notify MD if not relieved by NTG   omeprazole 20 MG capsule Commonly known as:  PRILOSEC Take 20 mg by mouth daily.   Oxycodone HCl 10 MG Tabs Take 10 mg by mouth. Daily at 12 am, 8 am, 12 pm, 4 pm, and 8 pm,  OXYGEN Inhale 2 L into the lungs as needed.   prednisoLONE acetate 1 % ophthalmic suspension Commonly known as:  PRED FORTE Place 1 drop into the right eye 4 (four) times daily. While awake   promethazine 12.5 MG tablet Commonly known as:  PHENERGAN Take 12.5 mg by mouth every 6 (six) hours as needed for nausea or vomiting.   sertraline 100 MG tablet Commonly known as:  ZOLOFT Take 100 mg by mouth daily. Take by mouth daily  along with 25 mg tablet to equal 125 mg total.   sertraline 25 MG tablet Commonly known as:  ZOLOFT Take 25 mg by mouth daily. Along with 100mg  to =125mg    spironolactone 25 MG tablet Commonly known as:  ALDACTONE Take 25 mg by mouth 2 (two) times daily. For edema   SYSTANE ULTRA OP Place 1 drop into both eyes at bedtime as needed. Wait 3 to 5 minutes between 2 eye meds   tiotropium 18 MCG inhalation capsule Commonly known as:  SPIRIVA Inhale 1 capsule (18 MCG CP Handihaler)  contents by taking two separate inhalations via handihaler device daily for COPD   TOUJEO SOLOSTAR 300 UNIT/ML Sopn Generic drug:  Insulin Glargine Inject 128 Units into the skin at bedtime.   VENTOLIN HFA 108 (90 Base) MCG/ACT inhaler Generic drug:  albuterol Inhale 2 puffs into the lungs every 8 (eight) hours as needed for wheezing or shortness of breath.       Review of Systems  Constitutional: Negative for activity change, appetite change, chills, fatigue and fever.  HENT: Negative for congestion,  rhinorrhea, sinus pressure, sneezing and sore throat.   Eyes: Negative.   Respiratory: Negative for cough, chest tightness, shortness of breath and wheezing.   Cardiovascular: Negative for chest pain, palpitations and leg swelling.  Gastrointestinal: Negative for abdominal distention, abdominal pain, constipation, nausea and vomiting.  Endocrine: Negative for cold intolerance, heat intolerance, polydipsia, polyphagia and polyuria.  Genitourinary: Negative for dysuria, flank pain and urgency.  Musculoskeletal: Positive for gait problem.       Chronic foot pain   Skin: Negative for color change, pallor and rash.  Neurological: Negative for dizziness, seizures, syncope, light-headedness and headaches.  Hematological: Does not bruise/bleed easily.  Psychiatric/Behavioral: Negative.  Negative for agitation, confusion, hallucinations and sleep disturbance. The patient is not nervous/anxious.     Immunization History  Administered Date(s) Administered  . Influenza-Unspecified 06/19/2014, 06/22/2015, 06/19/2016  . PPD Test 12/26/2013, 01/12/2014, 05/18/2015  . Pneumococcal-Unspecified 07/02/2014   Pertinent  Health Maintenance Due  Topic Date Due  . OPHTHALMOLOGY EXAM  09/01/2015  . FOOT EXAM  09/14/2016  . URINE MICROALBUMIN  12/02/2016  . PNA vac Low Risk Adult (1 of 2 - PCV13) 09/11/2017 (Originally 01/09/2016)  . INFLUENZA VACCINE  04/11/2017  . HEMOGLOBIN A1C  06/13/2017  . MAMMOGRAM  12/01/2017  . PAP SMEAR  12/02/2018  . COLONOSCOPY  02/07/2024  . DEXA SCAN  Completed    Vitals:   12/26/16 1027  BP: (!) 110/54  Pulse: 92  Resp: 20  Temp: 98.5 F (36.9 C)  TempSrc: Oral  SpO2: 96%  Weight: 194 lb 6.4 oz (88.2 kg)  Height: 5\' 3"  (1.6 m)   Body mass index is 34.44 kg/m. Physical Exam  Constitutional: She is oriented to person, place, and time. She appears well-developed and well-nourished. No distress.  Elderly in no acute distress   HENT:  Head: Normocephalic.  Right  Ear: External ear normal.  Left Ear: External ear normal.  Mouth/Throat: Oropharynx is clear and moist.  Eyes: Conjunctivae and EOM are normal. Pupils are equal, round, and reactive to light. Right eye exhibits no discharge. Left eye exhibits no discharge. No scleral icterus.  Neck: Normal range of motion. No JVD present. No thyromegaly present.  Cardiovascular: Normal rate, regular rhythm, normal heart sounds and intact distal pulses.  Exam reveals no gallop and no friction rub.   No murmur heard. Pulmonary/Chest: Effort normal and breath sounds normal. No respiratory distress. She has no wheezes. She has no rales.  Abdominal: Soft. Bowel sounds are normal. She exhibits no distension and no mass. There is no rebound and no guarding.  Genitourinary:  Genitourinary Comments: Incontinent  Musculoskeletal: She exhibits no edema or tenderness.  Spends mos time in bed.Left foot drop.Hemiparalysis   Lymphadenopathy:    She has no cervical adenopathy.  Neurological: She is oriented to person, place, and time.  Skin: Skin is warm and dry. No rash noted. No erythema. No pallor.  Psychiatric: She has a normal mood and affect.    Labs reviewed:  Recent Labs  06/27/16 11/23/16 1342  NA 140 137  K 3.8 3.9  BUN 14 11  CREATININE 0.8 0.6    Recent Labs  06/27/16 11/23/16 1342  WBC 13.7 7.2  HGB 12.5 10.7*  HCT 41 35*  PLT 266 219   Lab Results  Component Value Date   TSH 2.39 11/23/2016   Lab Results  Component Value Date   HGBA1C 8.3 12/12/2016   Lab Results  Component Value Date   CHOL 193 11/23/2016   HDL 22 (A) 11/23/2016   LDLCALC 136 11/23/2016   TRIG 174 (A) 11/23/2016   Assessment/Plan  Hypothyroidism  Recent TSH level 2.39 ( 11/23/2016).Continue on levothyroxine.Check TSH level in 3 months.   Type 2 DM Lab Results  Component Value Date   HGBA1C 8.3 12/12/2016  CBG's ranging in the 120's- 200's.Continue on Tuojeo, Amryl, Humalog and Tradjenta.continue on ASA and  Lipitor. Up to date with annual eye and foot exam.Recheck  Hgb A1C in 3-6 months. Check urine microalbuminuria next visit.   HTN B/p on target goal for type 2 DM. Continue imdur. Monitor BMP  Status post right cataract extraction  Status post right eye cataract surgery 12/07/2016 at [facility] with Dr. Otto Herb. She denies any acute issues with right eye. She is currently on Prednisone 1% and Moxifloxacin 0.5 % eye drop no duration. Facility Nurse to clarify with Dr. Otto Herb Opthalmology office duration of eye drops.  Family/ staff Communication: Reviewed plan of care with patient and facility Nurse supervisor.   Labs/tests ordered: None

## 2016-12-28 DIAGNOSIS — E1136 Type 2 diabetes mellitus with diabetic cataract: Secondary | ICD-10-CM | POA: Diagnosis not present

## 2016-12-28 DIAGNOSIS — E119 Type 2 diabetes mellitus without complications: Secondary | ICD-10-CM | POA: Diagnosis not present

## 2016-12-28 DIAGNOSIS — I110 Hypertensive heart disease with heart failure: Secondary | ICD-10-CM | POA: Diagnosis not present

## 2016-12-28 DIAGNOSIS — I2510 Atherosclerotic heart disease of native coronary artery without angina pectoris: Secondary | ICD-10-CM | POA: Diagnosis not present

## 2016-12-28 DIAGNOSIS — Z9989 Dependence on other enabling machines and devices: Secondary | ICD-10-CM | POA: Diagnosis not present

## 2016-12-28 DIAGNOSIS — E669 Obesity, unspecified: Secondary | ICD-10-CM | POA: Diagnosis not present

## 2016-12-28 DIAGNOSIS — H25812 Combined forms of age-related cataract, left eye: Secondary | ICD-10-CM | POA: Diagnosis not present

## 2016-12-28 DIAGNOSIS — F339 Major depressive disorder, recurrent, unspecified: Secondary | ICD-10-CM | POA: Diagnosis not present

## 2016-12-28 DIAGNOSIS — B351 Tinea unguium: Secondary | ICD-10-CM | POA: Diagnosis not present

## 2016-12-28 DIAGNOSIS — F410 Panic disorder [episodic paroxysmal anxiety] without agoraphobia: Secondary | ICD-10-CM | POA: Diagnosis not present

## 2016-12-28 DIAGNOSIS — E1169 Type 2 diabetes mellitus with other specified complication: Secondary | ICD-10-CM | POA: Diagnosis not present

## 2016-12-28 DIAGNOSIS — I70203 Unspecified atherosclerosis of native arteries of extremities, bilateral legs: Secondary | ICD-10-CM | POA: Diagnosis not present

## 2016-12-28 DIAGNOSIS — E11621 Type 2 diabetes mellitus with foot ulcer: Secondary | ICD-10-CM | POA: Diagnosis not present

## 2016-12-28 DIAGNOSIS — I509 Heart failure, unspecified: Secondary | ICD-10-CM | POA: Diagnosis not present

## 2016-12-28 DIAGNOSIS — Z993 Dependence on wheelchair: Secondary | ICD-10-CM | POA: Diagnosis not present

## 2016-12-28 DIAGNOSIS — Z87891 Personal history of nicotine dependence: Secondary | ICD-10-CM | POA: Diagnosis not present

## 2016-12-28 DIAGNOSIS — H25813 Combined forms of age-related cataract, bilateral: Secondary | ICD-10-CM | POA: Diagnosis not present

## 2016-12-28 DIAGNOSIS — Z794 Long term (current) use of insulin: Secondary | ICD-10-CM | POA: Diagnosis not present

## 2016-12-28 DIAGNOSIS — I69354 Hemiplegia and hemiparesis following cerebral infarction affecting left non-dominant side: Secondary | ICD-10-CM | POA: Diagnosis not present

## 2016-12-28 DIAGNOSIS — E1165 Type 2 diabetes mellitus with hyperglycemia: Secondary | ICD-10-CM | POA: Diagnosis not present

## 2016-12-28 DIAGNOSIS — G4730 Sleep apnea, unspecified: Secondary | ICD-10-CM | POA: Diagnosis not present

## 2016-12-28 DIAGNOSIS — E1149 Type 2 diabetes mellitus with other diabetic neurological complication: Secondary | ICD-10-CM | POA: Diagnosis not present

## 2016-12-28 DIAGNOSIS — E7800 Pure hypercholesterolemia, unspecified: Secondary | ICD-10-CM | POA: Diagnosis not present

## 2016-12-28 DIAGNOSIS — Z955 Presence of coronary angioplasty implant and graft: Secondary | ICD-10-CM | POA: Diagnosis not present

## 2016-12-28 DIAGNOSIS — M79674 Pain in right toe(s): Secondary | ICD-10-CM | POA: Diagnosis not present

## 2016-12-28 DIAGNOSIS — E039 Hypothyroidism, unspecified: Secondary | ICD-10-CM | POA: Diagnosis not present

## 2016-12-28 DIAGNOSIS — L89623 Pressure ulcer of left heel, stage 3: Secondary | ICD-10-CM | POA: Diagnosis not present

## 2016-12-28 DIAGNOSIS — J449 Chronic obstructive pulmonary disease, unspecified: Secondary | ICD-10-CM | POA: Diagnosis not present

## 2016-12-28 DIAGNOSIS — E785 Hyperlipidemia, unspecified: Secondary | ICD-10-CM | POA: Diagnosis not present

## 2016-12-28 DIAGNOSIS — Z7982 Long term (current) use of aspirin: Secondary | ICD-10-CM | POA: Diagnosis not present

## 2016-12-28 LAB — HM DIABETES FOOT EXAM

## 2016-12-29 DIAGNOSIS — Z9842 Cataract extraction status, left eye: Secondary | ICD-10-CM | POA: Diagnosis not present

## 2016-12-29 DIAGNOSIS — E1169 Type 2 diabetes mellitus with other specified complication: Secondary | ICD-10-CM | POA: Diagnosis not present

## 2016-12-29 DIAGNOSIS — H04123 Dry eye syndrome of bilateral lacrimal glands: Secondary | ICD-10-CM | POA: Diagnosis not present

## 2016-12-29 DIAGNOSIS — E7800 Pure hypercholesterolemia, unspecified: Secondary | ICD-10-CM | POA: Diagnosis not present

## 2016-12-29 DIAGNOSIS — Z888 Allergy status to other drugs, medicaments and biological substances status: Secondary | ICD-10-CM | POA: Diagnosis not present

## 2016-12-29 DIAGNOSIS — Z79899 Other long term (current) drug therapy: Secondary | ICD-10-CM | POA: Diagnosis not present

## 2016-12-29 DIAGNOSIS — E669 Obesity, unspecified: Secondary | ICD-10-CM | POA: Diagnosis not present

## 2016-12-29 DIAGNOSIS — Z794 Long term (current) use of insulin: Secondary | ICD-10-CM | POA: Diagnosis not present

## 2016-12-29 DIAGNOSIS — Z961 Presence of intraocular lens: Secondary | ICD-10-CM | POA: Diagnosis not present

## 2016-12-29 DIAGNOSIS — Z885 Allergy status to narcotic agent status: Secondary | ICD-10-CM | POA: Diagnosis not present

## 2016-12-29 DIAGNOSIS — Z87891 Personal history of nicotine dependence: Secondary | ICD-10-CM | POA: Diagnosis not present

## 2016-12-29 DIAGNOSIS — Z8673 Personal history of transient ischemic attack (TIA), and cerebral infarction without residual deficits: Secondary | ICD-10-CM | POA: Diagnosis not present

## 2016-12-29 DIAGNOSIS — Z7982 Long term (current) use of aspirin: Secondary | ICD-10-CM | POA: Diagnosis not present

## 2016-12-29 DIAGNOSIS — Z955 Presence of coronary angioplasty implant and graft: Secondary | ICD-10-CM | POA: Diagnosis not present

## 2016-12-29 DIAGNOSIS — E1149 Type 2 diabetes mellitus with other diabetic neurological complication: Secondary | ICD-10-CM | POA: Diagnosis not present

## 2016-12-29 DIAGNOSIS — Z48810 Encounter for surgical aftercare following surgery on the sense organs: Secondary | ICD-10-CM | POA: Diagnosis not present

## 2016-12-29 DIAGNOSIS — I509 Heart failure, unspecified: Secondary | ICD-10-CM | POA: Diagnosis not present

## 2016-12-29 DIAGNOSIS — J449 Chronic obstructive pulmonary disease, unspecified: Secondary | ICD-10-CM | POA: Diagnosis not present

## 2016-12-29 DIAGNOSIS — Z9841 Cataract extraction status, right eye: Secondary | ICD-10-CM | POA: Diagnosis not present

## 2016-12-29 DIAGNOSIS — E039 Hypothyroidism, unspecified: Secondary | ICD-10-CM | POA: Diagnosis not present

## 2016-12-29 DIAGNOSIS — I2510 Atherosclerotic heart disease of native coronary artery without angina pectoris: Secondary | ICD-10-CM | POA: Diagnosis not present

## 2016-12-29 DIAGNOSIS — I110 Hypertensive heart disease with heart failure: Secondary | ICD-10-CM | POA: Diagnosis not present

## 2017-01-05 DIAGNOSIS — Z961 Presence of intraocular lens: Secondary | ICD-10-CM | POA: Diagnosis not present

## 2017-01-05 DIAGNOSIS — Z9841 Cataract extraction status, right eye: Secondary | ICD-10-CM | POA: Diagnosis not present

## 2017-01-19 ENCOUNTER — Non-Acute Institutional Stay (SKILLED_NURSING_FACILITY): Payer: Medicare Other | Admitting: Family

## 2017-01-19 ENCOUNTER — Encounter: Payer: Self-pay | Admitting: Family

## 2017-01-19 DIAGNOSIS — IMO0002 Reserved for concepts with insufficient information to code with codable children: Secondary | ICD-10-CM

## 2017-01-19 DIAGNOSIS — F419 Anxiety disorder, unspecified: Secondary | ICD-10-CM

## 2017-01-19 DIAGNOSIS — E1140 Type 2 diabetes mellitus with diabetic neuropathy, unspecified: Secondary | ICD-10-CM | POA: Diagnosis not present

## 2017-01-19 DIAGNOSIS — E785 Hyperlipidemia, unspecified: Secondary | ICD-10-CM | POA: Diagnosis not present

## 2017-01-19 DIAGNOSIS — Z794 Long term (current) use of insulin: Secondary | ICD-10-CM

## 2017-01-19 DIAGNOSIS — E1165 Type 2 diabetes mellitus with hyperglycemia: Secondary | ICD-10-CM

## 2017-01-19 DIAGNOSIS — E039 Hypothyroidism, unspecified: Secondary | ICD-10-CM

## 2017-01-25 ENCOUNTER — Non-Acute Institutional Stay (SKILLED_NURSING_FACILITY): Payer: Medicare Other

## 2017-01-25 DIAGNOSIS — F329 Major depressive disorder, single episode, unspecified: Secondary | ICD-10-CM | POA: Diagnosis not present

## 2017-01-25 DIAGNOSIS — Z0000 Encounter for general adult medical examination without abnormal findings: Secondary | ICD-10-CM | POA: Diagnosis not present

## 2017-01-25 DIAGNOSIS — F411 Generalized anxiety disorder: Secondary | ICD-10-CM | POA: Diagnosis not present

## 2017-01-25 DIAGNOSIS — F319 Bipolar disorder, unspecified: Secondary | ICD-10-CM | POA: Diagnosis not present

## 2017-01-25 DIAGNOSIS — G4700 Insomnia, unspecified: Secondary | ICD-10-CM | POA: Diagnosis not present

## 2017-01-25 NOTE — Progress Notes (Signed)
Quick Notes   Health Maintenance: Eye exam due. I'm putting in an order for PN13     Abnormal Screen: 6 CIT- 4. PHQ-9:14     Patient Concerns: Has some questions about her medications. Wondering why Plavix was taken off when she first got here and not replaced with something else for her stents. And also why her sleeping pills were d/c.     Nurse Concerns: None

## 2017-01-25 NOTE — Patient Instructions (Signed)
Susan [name] , Thank you for taking time to come for your Medicare Wellness Visit. I appreciate your ongoing commitment to your health goals. Please review the following plan we discussed and let me know if I can assist you in the future.   Screening recommendations/referrals: Colonoscopy excluded due to being in long term facility. Mammogram excluded due to being in long term facility Bone Density up to date Recommended yearly ophthalmology/optometry visit for glaucoma screening and checkup Recommended yearly dental visit for hygiene and checkup  Vaccinations: Influenza vaccine due 06/19/2017 Pneumococcal vaccine up to date Tdap vaccine not in records Shingles vaccine not in records  Advanced directives: In Chart  Conditions/risks identified: None  Next appointment: None upcoming   Preventive Care 65 Years and Older, Female Preventive care refers to lifestyle choices and visits with your health care provider that can promote health and wellness. What does preventive care include?  A yearly physical exam. This is also called an annual well check.  Dental exams once or twice a year.  Routine eye exams. Ask your health care provider how often you should have your eyes checked.  Personal lifestyle choices, including:  Daily care of your teeth and gums.  Regular physical activity.  Eating a healthy diet.  Avoiding tobacco and drug use.  Limiting alcohol use.  Practicing safe sex.  Taking low-dose aspirin every day.  Taking vitamin and mineral supplements as recommended by your health care provider. What happens during an annual well check? The services and screenings done by your health care provider during your annual well check will depend on your age, overall health, lifestyle risk factors, and family history of disease. Counseling  Your health care provider may ask you questions about your:  Alcohol use.  Tobacco use.  Drug use.  Emotional well-being.  Home  and relationship well-being.  Sexual activity.  Eating habits.  History of falls.  Memory and ability to understand (cognition).  Work and work Statistician.  Reproductive health. Screening  You may have the following tests or measurements:  Height, weight, and BMI.  Blood pressure.  Lipid and cholesterol levels. These may be checked every 5 years, or more frequently if you are over 54 years old.  Skin check.  Lung cancer screening. You may have this screening every year starting at age 69 if you have a 30-pack-year history of smoking and currently smoke or have quit within the past 15 years.  Fecal occult blood test (FOBT) of the stool. You may have this test every year starting at age 46.  Flexible sigmoidoscopy or colonoscopy. You may have a sigmoidoscopy every 5 years or a colonoscopy every 10 years starting at age 41.  Hepatitis C blood test.  Hepatitis B blood test.  Sexually transmitted disease (STD) testing.  Diabetes screening. This is done by checking your blood sugar (glucose) after you have not eaten for a while (fasting). You may have this done every 1-3 years.  Bone density scan. This is done to screen for osteoporosis. You may have this done starting at age 45.  Mammogram. This may be done every 1-2 years. Talk to your health care provider about how often you should have regular mammograms. Talk with your health care provider about your test results, treatment options, and if necessary, the need for more tests. Vaccines  Your health care provider may recommend certain vaccines, such as:  Influenza vaccine. This is recommended every year.  Tetanus, diphtheria, and acellular pertussis (Tdap, Td) vaccine. You may need  a Td booster every 10 years.  Zoster vaccine. You may need this after age 74.  Pneumococcal 13-valent conjugate (PCV13) vaccine. One dose is recommended after age 37.  Pneumococcal polysaccharide (PPSV23) vaccine. One dose is recommended  after age 30. Talk to your health care provider about which screenings and vaccines you need and how often you need them. This information is not intended to replace advice given to you by your health care provider. Make sure you discuss any questions you have with your health care provider. Document Released: 09/24/2015 Document Revised: 05/17/2016 Document Reviewed: 06/29/2015 Elsevier Interactive Patient Education  2017 [place] Prevention in the Home Falls can cause injuries. They can happen to people of all ages. There are many things you can do to make your home safe and to help prevent falls. What can I do on the outside of my home?  Regularly fix the edges of walkways and driveways and fix any cracks.  Remove anything that might make you trip as you walk through a door, such as a raised step or threshold.  Trim any bushes or trees on the path to your home.  Use bright outdoor lighting.  Clear any walking paths of anything that might make someone trip, such as rocks or tools.  Regularly check to see if handrails are loose or broken. Make sure that both sides of any steps have handrails.  Any raised decks and porches should have guardrails on the edges.  Have any leaves, snow, or ice cleared regularly.  Use sand or salt on walking paths during winter.  Clean up any spills in your garage right away. This includes oil or grease spills. What can I do in the bathroom?  Use night lights.  Install grab bars by the toilet and in the tub and shower. Do not use towel bars as grab bars.  Use non-skid mats or decals in the tub or shower.  If you need to sit down in the shower, use a plastic, non-slip stool.  Keep the floor dry. Clean up any water that spills on the floor as soon as it happens.  Remove soap buildup in the tub or shower regularly.  Attach bath mats securely with double-sided non-slip rug tape.  Do not have throw rugs and other things on the floor  that can make you trip. What can I do in the bedroom?  Use night lights.  Make sure that you have a light by your bed that is easy to reach.  Do not use any sheets or blankets that are too big for your bed. They should not hang down onto the floor.  Have a firm chair that has side arms. You can use this for support while you get dressed.  Do not have throw rugs and other things on the floor that can make you trip. What can I do in the kitchen?  Clean up any spills right away.  Avoid walking on wet floors.  Keep items that you use a lot in easy-to-reach places.  If you need to reach something above you, use a strong step stool that has a grab bar.  Keep electrical cords out of the way.  Do not use floor polish or wax that makes floors slippery. If you must use wax, use non-skid floor wax.  Do not have throw rugs and other things on the floor that can make you trip. What can I do with my stairs?  Do not leave any items on the  stairs.  Make sure that there are handrails on both sides of the stairs and use them. Fix handrails that are broken or loose. Make sure that handrails are as long as the stairways.  Check any carpeting to make sure that it is firmly attached to the stairs. Fix any carpet that is loose or worn.  Avoid having throw rugs at the top or bottom of the stairs. If you do have throw rugs, attach them to the floor with carpet tape.  Make sure that you have a light switch at the top of the stairs and the bottom of the stairs. If you do not have them, ask someone to add them for you. What else can I do to help prevent falls?  Wear shoes that:  Do not have high heels.  Have rubber bottoms.  Are comfortable and fit you well.  Are closed at the toe. Do not wear sandals.  If you use a stepladder:  Make sure that it is fully opened. Do not climb a closed stepladder.  Make sure that both sides of the stepladder are locked into place.  Ask someone to hold it  for you, if possible.  Clearly mark and make sure that you can see:  Any grab bars or handrails.  First and last steps.  Where the edge of each step is.  Use tools that help you move around (mobility aids) if they are needed. These include:  Canes.  Walkers.  Scooters.  Crutches.  Turn on the lights when you go into a dark area. Replace any light bulbs as soon as they burn out.  Set up your furniture so you have a clear path. Avoid moving your furniture around.  If any of your floors are uneven, fix them.  If there are any pets around you, be aware of where they are.  Review your medicines with your doctor. Some medicines can make you feel dizzy. This can increase your chance of falling. Ask your doctor what other things that you can do to help prevent falls. This information is not intended to replace advice given to you by your health care provider. Make sure you discuss any questions you have with your health care provider. Document Released: 06/24/2009 Document Revised: 02/03/2016 Document Reviewed: 10/02/2014 Elsevier Interactive Patient Education  2017 [employer].

## 2017-01-25 NOTE — Progress Notes (Signed)
Subjective:   Susan [name] is a 66 y.o. female who presents for an Initial Medicare Annual Wellness Visit at [facility]       Objective:    Today's Vitals   01/25/17 0940 01/25/17 0942  BP: (!) 120/50   Pulse: 86   Temp: 98.1 F (36.7 C)   TempSrc: Oral   SpO2: 93%   Weight: 194 lb (88 kg)   Height: 5\' 3"  (1.6 m)   PainSc:  10-Worst pain ever   Body mass index is 34.37 kg/m.   Current Medications (verified) Outpatient Encounter Prescriptions as of 01/25/2017  Medication Sig  . acetaminophen (TYLENOL) 325 MG tablet Take 650 mg by mouth every 4 (four) hours as needed for mild pain.  Marland Kitchen albuterol (VENTOLIN HFA) 108 (90 Base) MCG/ACT inhaler Inhale 2 puffs into the lungs every 8 (eight) hours as needed for wheezing or shortness of breath.  Marland Kitchen aspirin 81 MG chewable tablet Chew 81 mg by mouth daily.   Marland Kitchen atorvastatin (LIPITOR) 10 MG tablet Take 10 mg by mouth daily.  . baclofen (LIORESAL) 10 MG tablet Take 10 mg by mouth 3 (three) times daily. For muscle spasms  . cyclobenzaprine (FLEXERIL) 5 MG tablet Take 5 mg by mouth 2 (two) times daily.   . diphenhydrAMINE (BENADRYL) 25 MG tablet Take 25 mg by mouth 4 (four) times daily. While awake for sleep  . docusate sodium (COLACE) 100 MG capsule Take 2 capsules (200 mg total) by mouth 2 (two) times daily.  . fluticasone (FLONASE) 50 MCG/ACT nasal spray Place 2 sprays into both nostrils daily. For chronic sinus infections  . furosemide (LASIX) 20 MG tablet Take 20 mg by mouth daily.  Marland Kitchen gabapentin (NEURONTIN) 400 MG capsule Take 1 (400 mg) capsule by mouth three times a day with meals. Also take 3 (1200mg ) capsules by mouth daily at bedtime.  Marland Kitchen glimepiride (AMARYL) 2 MG tablet Take 2 mg by mouth daily with breakfast.   . hydrocortisone 2.5 % lotion Apply 1 application topically 2 (two) times daily as needed.  . Insulin Glargine (TOUJEO SOLOSTAR) 300 UNIT/ML SOPN Inject 128 Units into the skin at bedtime.   . insulin lispro  (HUMALOG) 100 UNIT/ML injection Inject 36 Units into the skin 3 (three) times daily before meals. For CBG >/= 150 inject 10 additional units subcutaneously  . isosorbide mononitrate (IMDUR) 30 MG 24 hr tablet Take 30 mg by mouth daily.   Marland Kitchen ketorolac (ACULAR) 0.4 % SOLN Apply 1 drop to eye 4 (four) times daily. Right eye  . levothyroxine (SYNTHROID, LEVOTHROID) 25 MCG tablet Take 25 mcg by mouth daily before breakfast.   . Linaclotide (LINZESS) 145 MCG CAPS capsule Take 145 mcg by mouth daily. Give 30 minutes prior to first meal and taken on an empty stomach.  . linagliptin (TRADJENTA) 5 MG TABS tablet Take 5 mg by mouth daily.   . Melatonin 3 MG TABS Take 6 mg by mouth at bedtime.   . Menthol, Topical Analgesic, (BIOFREEZE) 4 % GEL Apply topically 4 (four) times daily. Apply to both feet.  . metFORMIN (GLUCOPHAGE) 1000 MG tablet Take 1,000 mg by mouth 2 (two) times daily with a meal.   . moxifloxacin (VIGAMOX) 0.5 % ophthalmic solution Place 1 drop into the left eye 4 (four) times daily. While awake   . nitroGLYCERIN (NITROSTAT) 0.4 MG SL tablet Place 0.4 mg under the tongue every 5 (five) minutes as needed for chest pain. Notify MD if not  relieved by NTG  . omeprazole (PRILOSEC) 20 MG capsule Take 20 mg by mouth daily.   . Oxycodone HCl 10 MG TABS Take 10 mg by mouth. Daily at 12 am, 8 am, 12 pm, 4 pm, and 8 pm,  . OXYGEN Inhale 2 L into the lungs as needed (for O2 sats 88% or lower).   Vladimir Faster Glycol-Propyl Glycol (SYSTANE ULTRA OP) Place 1 drop into both eyes at bedtime as needed. Wait 3 to 5 minutes between 2 eye meds  . prednisoLONE acetate (PRED FORTE) 1 % ophthalmic suspension Place 1 drop into the left eye as directed. 1 drop QID x1 week; then TID x1 week; then BID x1 week; then QD x1 week; then stop  . promethazine (PHENERGAN) 12.5 MG tablet Take 12.5 mg by mouth every 6 (six) hours as needed for nausea or vomiting.  . sertraline (ZOLOFT) 100 MG tablet Take 100 mg by mouth daily. Take  by mouth daily  along with 50 mg tablet to equal 150 mg total.  . sertraline (ZOLOFT) 50 MG tablet Take 50 mg by mouth daily. Take along with 100mg  to make 150mg  total  . spironolactone (ALDACTONE) 25 MG tablet Take 25 mg by mouth 2 (two) times daily. For edema  . tiotropium (SPIRIVA) 18 MCG inhalation capsule Inhale 1 capsule (18 MCG CP Handihaler)  contents by taking two separate inhalations via handihaler device daily for COPD   No facility-administered encounter medications on file as of 01/25/2017.     Allergies (verified) Onion; Pollen extract; Morphine and related; Niaspan [niacin er]; Other; Trazodone and nefazodone; Valium [diazepam]; and Wellbutrin [bupropion]   History: Past Medical History:  Diagnosis Date  . Anemia   . CAD (coronary artery disease)   . Chronic pain   . COPD (chronic obstructive pulmonary disease) ([place])   . Depression   . Diabetes mellitus without complication ([place])   . Fall   . Hemiplegia affecting non-dominant side, post-stroke   . Hyperlipidemia   . Hypertension   . Stroke ([place]) 12/2013   left side paralysis  . Thyroid disease   . Urine retention    Past Surgical History:  Procedure Laterality Date  . ABDOMINAL HYSTERECTOMY    . APPENDECTOMY    . CATARACT EXTRACTION Right 12/07/2016  . CHOLECYSTECTOMY    . JOINT REPLACEMENT     bil hip  . TONSILLECTOMY     History reviewed. No pertinent family history. Social History   Occupational History  . Not on file.   Social History Main Topics  . Smoking status: Former Smoker    Packs/day: 4.00    Years: 40.00    Types: Cigarettes  . Smokeless tobacco: Never Used  . Alcohol use No  . Drug use: No  . Sexual activity: Not Currently    Tobacco Counseling Counseling given: Not Answered   Activities of Daily Living In your present state of health, do you have any difficulty performing the following activities: 01/25/2017  Hearing? Y  Vision? N  Difficulty concentrating or making decisions?  N  Walking or climbing stairs? Y  Dressing or bathing? Y  Doing errands, shopping? Y  Preparing Food and eating ? Y  Using the Toilet? Y  In the past six months, have you accidently leaked urine? Y  Do you have problems with loss of bowel control? Y  Managing your Medications? Y  Managing your Finances? Y  Housekeeping or managing your Housekeeping? Y  Some recent data might be hidden  Immunizations and Health Maintenance Immunization History  Administered Date(s) Administered  . Influenza-Unspecified 06/19/2014, 06/22/2015, 06/19/2016  . PPD Test 12/26/2013, 01/12/2014, 05/18/2015  . Pneumococcal-Unspecified 07/02/2014   Health Maintenance Due  Topic Date Due  . OPHTHALMOLOGY EXAM  09/01/2015  . URINE MICROALBUMIN  12/02/2016    Patient Care Team: Blanchie Serve, MD as PCP - General (Internal Medicine) Ngetich, Nelda Bucks, NP as Nurse Practitioner (Family Medicine)  Indicate any recent Medical Services you may have received from other than [facility] providers in the past year (date may be approximate).     Assessment:   This is a routine wellness examination for Susan [name].   Hearing/Vision screen No exam data present  Dietary issues and exercise activities discussed: Current Exercise Habits: The patient does not participate in regular exercise at present, Exercise limited by: orthopedic condition(s);cardiac condition(s);respiratory conditions(s)  Goals    . Maintain Lifestyle          Starting today pt will maintain lifestyle.      Depression Screen PHQ 2/9 Scores 01/25/2017  PHQ - 2 Score 4  PHQ- 9 Score 14    Fall Risk Fall Risk  01/25/2017  Falls in the past year? Yes  Number falls in past yr: 2 or more  Injury with Fall? No    Cognitive Function:     6CIT Screen 01/25/2017  What Year? 0 points  What month? 0 points  What time? 0 points  Count back from 20 0 points  Months in reverse 0 points  Repeat phrase 4 points  Total Score 4    Screening  Tests Health Maintenance  Topic Date Due  . OPHTHALMOLOGY EXAM  09/01/2015  . URINE MICROALBUMIN  12/02/2016  . PNA vac Low Risk Adult (1 of 2 - PCV13) 09/11/2017 (Originally 01/09/2016)  . Hepatitis C Screening  12/10/2023 (Originally 01-15-1951)  . INFLUENZA VACCINE  04/11/2017  . HEMOGLOBIN A1C  06/13/2017  . MAMMOGRAM  12/01/2017  . FOOT EXAM  12/28/2017  . TETANUS/TDAP  01/27/2024  . COLONOSCOPY  02/07/2024  . DEXA SCAN  Completed      Plan:    I have personally reviewed and addressed the Medicare Annual Wellness questionnaire and have noted the following in the patient's chart:  A. Medical and social history B. Use of alcohol, tobacco or illicit drugs  C. Current medications and supplements D. Functional ability and status E.  Nutritional status F.  Physical activity G. Advance directives H. List of other physicians I.  Hospitalizations, surgeries, and ER visits in previous 12 months J.  [place] to include hearing, vision, cognitive, depression L. Referrals and appointments - none  In addition, I have reviewed and discussed with patient certain preventive protocols, quality metrics, and best practice recommendations. A written personalized care plan for preventive services as well as general preventive health recommendations were provided to patient.  See attached scanned questionnaire for additional information.   Signed,   Rich Reining, RN Nurse Health Advisor     I agree with above and was available to answer medical question/ concerns from [place].

## 2017-01-28 NOTE — Progress Notes (Signed)
Patient ID: Susan [name], female   DOB: 1950-10-19, 66 y.o.   MRN: [id number]  Location:  [place] Room Number: 1001A Place of Service:  SNF (31) Provider: Romen Yutzy FNP-C   Blanchie Serve, MD  Patient Care Team: Blanchie Serve, MD as PCP - General (Internal Medicine) Cheick Suhr, Nelda Bucks, NP as Nurse Practitioner [facility])  Extended Emergency Contact Information Primary Emergency Contact: Roblstow,Beth Address: [address]          [place], [place] [address] Johnnette Litter of [place] Phone: [phone] Work Phone: [phone] Mobile Phone: [phone] Relation: Daughter Secondary Emergency Contact: Ratz,Scott Address: [address]          [place], [place] [address] Johnnette Litter of [place] Phone: [phone] Mobile Phone: [phone] Relation: Spouse  Code Status: Full code  Goals of care: Advanced Directive information Advanced Directives 01/25/2017  Does Patient Have a Medical Advance Directive? Yes  Type of Advance Directive Out of facility DNR (pink MOST or yellow form)  Does patient want to make changes to medical advance directive? No - Patient declined  Copy of [place] in Chart? -  Would patient like information on creating a medical advance directive? No - Patient declined  Pre-existing out of facility DNR order (yellow form or pink MOST form) Pink MOST form placed in chart (order not valid for inpatient use)     Chief Complaint  Patient presents with  . Medical Management of Chronic Issues    Routine visit    HPI:  Pt is a 66 y.o. female seen today at [facility] for medical management of chronic diseases. She has a significant medical history of HTN,CHF, COPD,Hyperlipidemia,Type 2 DM, CAD,CVA, Hypothyroidism,Anxiety, Depression among other conditions.She is seen in her room today.she complains of occassional panic attacks states previous ativan used to help with  anxiety. She is awaiting Psychiatry evaluation. No recent acute illness, fall episodes or weight changes. Facility staff reports no new concerns.   Past Medical History:  Diagnosis Date  . Anemia   . CAD (coronary artery disease)   . Chronic pain   . COPD (chronic obstructive pulmonary disease) ([place])   . Depression   . Diabetes mellitus without complication ([place])   . Fall   . Hemiplegia affecting non-dominant side, post-stroke   . Hyperlipidemia   . Hypertension   . Stroke ([place]) 12/2013   left side paralysis  . Thyroid disease   . Urine retention    Past Surgical History:  Procedure Laterality Date  . ABDOMINAL HYSTERECTOMY    . APPENDECTOMY    . CATARACT EXTRACTION Right 12/07/2016  . CHOLECYSTECTOMY    . JOINT REPLACEMENT     bil hip  . TONSILLECTOMY      Allergies  Allergen Reactions  . Onion Shortness Of Breath and Swelling  . Pollen Extract Itching  . Morphine And Related Hives    PT DENIES ALLERGY  . Niaspan [Niacin Er] Other (See Comments)    unknown  . Other     onions  . Trazodone And Nefazodone Other (See Comments)    Hallucinations  . Valium [Diazepam] Other (See Comments)    unknown  . Wellbutrin [Bupropion] Other (See Comments)    unknown    Allergies as of 01/19/2017      Reactions   Onion Shortness Of Breath, Swelling   Pollen Extract Itching   Morphine And Related Hives   PT DENIES ALLERGY  Niaspan [niacin Er] Other (See Comments)   unknown   Other    onions   Trazodone And Nefazodone Other (See Comments)   Hallucinations   Valium [diazepam] Other (See Comments)   unknown   Wellbutrin [bupropion] Other (See Comments)   unknown      Medication List       Accurate as of 01/19/17 11:59 PM. Always use your most recent med list.          acetaminophen 325 MG tablet Commonly known as:  TYLENOL Take 650 mg by mouth every 4 (four) hours as needed for mild pain.   aspirin 81 MG chewable tablet Chew 81 mg by mouth daily.     atorvastatin 10 MG tablet Commonly known as:  LIPITOR Take 10 mg by mouth daily.   baclofen 10 MG tablet Commonly known as:  LIORESAL Take 10 mg by mouth 3 (three) times daily. For muscle spasms   BIOFREEZE 4 % Gel Generic drug:  Menthol (Topical Analgesic) Apply topically 4 (four) times daily. Apply to both feet.   cyclobenzaprine 5 MG tablet Commonly known as:  FLEXERIL Take 5 mg by mouth 2 (two) times daily.   diphenhydrAMINE 25 MG tablet Commonly known as:  BENADRYL Take 25 mg by mouth every 6 (six) hours as needed. While awake for sleep   docusate sodium 100 MG capsule Commonly known as:  COLACE Take 2 capsules (200 mg total) by mouth 2 (two) times daily.   fluticasone 50 MCG/ACT nasal spray Commonly known as:  FLONASE Place 2 sprays into both nostrils daily. For chronic sinus infections   furosemide 20 MG tablet Commonly known as:  LASIX Take 20 mg by mouth daily.   gabapentin 400 MG capsule Commonly known as:  NEURONTIN Take 1 (400 mg) capsule by mouth three times a day with meals. Also take 3 (1200mg ) capsules by mouth daily at bedtime.   glimepiride 2 MG tablet Commonly known as:  AMARYL Take 2 mg by mouth daily with breakfast.   hydrocortisone 2.5 % lotion Apply 1 application topically 2 (two) times daily as needed.   insulin lispro 100 UNIT/ML injection Commonly known as:  HUMALOG Inject 36 Units into the skin 3 (three) times daily before meals. For CBG >/= 150 inject 10 additional units subcutaneously   isosorbide mononitrate 30 MG 24 hr tablet Commonly known as:  IMDUR Take 30 mg by mouth daily.   ketorolac 0.4 % Soln Commonly known as:  ACULAR Apply 1 drop to eye 4 (four) times daily. Right eye   levothyroxine 25 MCG tablet Commonly known as:  SYNTHROID, LEVOTHROID Take 25 mcg by mouth daily before breakfast.   linaclotide 145 MCG Caps capsule Commonly known as:  LINZESS Take 145 mcg by mouth daily. Give 30 minutes prior to first meal and  taken on an empty stomach.   linagliptin 5 MG Tabs tablet Commonly known as:  TRADJENTA Take 5 mg by mouth daily.   Melatonin 3 MG Tabs Take 6 mg by mouth at bedtime.   metFORMIN 1000 MG tablet Commonly known as:  GLUCOPHAGE Take 1,000 mg by mouth 2 (two) times daily with a meal.   moxifloxacin 0.5 % ophthalmic solution Commonly known as:  VIGAMOX Place 1 drop into the left eye 4 (four) times daily. While awake   nitroGLYCERIN 0.4 MG SL tablet Commonly known as:  NITROSTAT Place 0.4 mg under the tongue every 5 (five) minutes as needed for chest pain. Notify MD if not relieved by  NTG   omeprazole 20 MG capsule Commonly known as:  PRILOSEC Take 20 mg by mouth daily.   Oxycodone HCl 10 MG Tabs Take 10 mg by mouth. Daily at 12 am, 8 am, 12 pm, 4 pm, and 8 pm,   OXYGEN Inhale 2 L into the lungs as needed (for O2 sats 88% or lower).   prednisoLONE acetate 1 % ophthalmic suspension Commonly known as:  PRED FORTE Place 1 drop into the left eye as directed. 1 drop QID x1 week; then TID x1 week; then BID x1 week; then QD x1 week; then stop   promethazine 12.5 MG tablet Commonly known as:  PHENERGAN Take 12.5 mg by mouth every 6 (six) hours as needed for nausea or vomiting.   sertraline 100 MG tablet Commonly known as:  ZOLOFT Take 100 mg by mouth daily. Take by mouth daily  along with 50 mg tablet to equal 150 mg total.   sertraline 50 MG tablet Commonly known as:  ZOLOFT Take 50 mg by mouth daily. Take along with 100mg  to make 150mg  total   spironolactone 25 MG tablet Commonly known as:  ALDACTONE Take 25 mg by mouth 2 (two) times daily. For edema   SYSTANE ULTRA OP Place 1 drop into both eyes at bedtime as needed. Wait 3 to 5 minutes between 2 eye meds   tiotropium 18 MCG inhalation capsule Commonly known as:  SPIRIVA Inhale 1 capsule (18 MCG CP Handihaler)  contents by taking two separate inhalations via handihaler device daily for COPD   TOUJEO SOLOSTAR 300 UNIT/ML  Sopn Generic drug:  Insulin Glargine Inject 128 Units into the skin at bedtime.   VENTOLIN HFA 108 (90 Base) MCG/ACT inhaler Generic drug:  albuterol Inhale 2 puffs into the lungs every 8 (eight) hours as needed for wheezing or shortness of breath.       Review of Systems  Constitutional: Negative for activity change, appetite change, chills, fatigue and fever.  HENT: Negative for congestion, rhinorrhea, sinus pressure, sneezing and sore throat.   Eyes: Negative.   Respiratory: Negative for cough, chest tightness, shortness of breath and wheezing.   Cardiovascular: Negative for chest pain, palpitations and leg swelling.  Gastrointestinal: Negative for abdominal distention, abdominal pain, constipation, nausea and vomiting.  Endocrine: Negative for cold intolerance, heat intolerance, polydipsia, polyphagia and polyuria.  Genitourinary: Negative for dysuria, flank pain and urgency.  Musculoskeletal: Positive for gait problem.       Has chronic foot pain   Skin: Negative for color change, pallor and rash.  Neurological: Negative for dizziness, seizures, syncope, light-headedness and headaches.  Hematological: Does not bruise/bleed easily.  Psychiatric/Behavioral: Negative for agitation, confusion, hallucinations and sleep disturbance. The patient is not nervous/anxious.        Reports panic attacks at times.     Immunization History  Administered Date(s) Administered  . Influenza-Unspecified 06/19/2014, 06/22/2015, 06/19/2016  . PPD Test 12/26/2013, 01/12/2014, 05/18/2015  . Pneumococcal-Unspecified 07/02/2014   Pertinent  Health Maintenance Due  Topic Date Due  . OPHTHALMOLOGY EXAM  09/01/2015  . URINE MICROALBUMIN  12/02/2016  . PNA vac Low Risk Adult (1 of 2 - PCV13) 09/11/2017 (Originally 01/09/2016)  . INFLUENZA VACCINE  04/11/2017  . HEMOGLOBIN A1C  06/13/2017  . MAMMOGRAM  12/01/2017  . FOOT EXAM  12/28/2017  . COLONOSCOPY  02/07/2024  . DEXA SCAN  Completed     Vitals:   01/19/17 1307  BP: 121/61  Pulse: 89  Resp: 20  Temp: 97.3 F (36.3  C)  SpO2: 94%  Weight: 194 lb 6.4 oz (88.2 kg)  Height: 5\' 3"  (1.6 m)   Body mass index is 34.44 kg/m. Physical Exam  Constitutional: She is oriented to person, place, and time. She appears well-developed and well-nourished. No distress.  HENT:  Head: Normocephalic.  Right Ear: External ear normal.  Left Ear: External ear normal.  Mouth/Throat: Oropharynx is clear and moist.  Eyes: Conjunctivae and EOM are normal. Pupils are equal, round, and reactive to light. Right eye exhibits no discharge. Left eye exhibits no discharge. No scleral icterus.  Neck: Normal range of motion. No JVD present. No thyromegaly present.  Cardiovascular: Normal rate, regular rhythm, normal heart sounds and intact distal pulses.  Exam reveals no gallop and no friction rub.   No murmur heard. Pulmonary/Chest: Effort normal and breath sounds normal. No respiratory distress. She has no wheezes. She has no rales.  Abdominal: Soft. Bowel sounds are normal. She exhibits no distension and no mass. There is no rebound and no guarding.  Genitourinary:  Genitourinary Comments: Incontinent  Musculoskeletal: She exhibits no edema or tenderness.  Hemiparalysis with Left foot drop.  Lymphadenopathy:    She has no cervical adenopathy.  Neurological: She is oriented to person, place, and time.  Skin: Skin is warm and dry. No rash noted. No erythema. No pallor.  Skin intact   Psychiatric: She has a normal mood and affect.    Labs reviewed:  Recent Labs  06/27/16 11/23/16 1342  NA 140 137  K 3.8 3.9  BUN 14 11  CREATININE 0.8 0.6    Recent Labs  06/27/16 11/23/16 1342  WBC 13.7 7.2  HGB 12.5 10.7*  HCT 41 35*  PLT 266 219   Lab Results  Component Value Date   TSH 2.39 11/23/2016   Lab Results  Component Value Date   HGBA1C 8.3 12/12/2016   Lab Results  Component Value Date   CHOL 193 11/23/2016   HDL 22 (A)  11/23/2016   LDLCALC 136 11/23/2016   TRIG 174 (A) 11/23/2016   Assessment/Plan  1.Type 2 DM Lab Results  Component Value Date   HGBA1C 8.3 12/12/2016  CBG's ranging in the 90's- 200's.Continue on Tuojeo, Amryl, Humalog and Tradjenta.continue on ASA and Lipitor. Up to date with annual eye and foot exam.continue to monitor Hgb A1C.Obtain urine specimen for microalbuminuria ratio.  Hyperlipidemia  LDL not at goal.Continue on atorvastatin 10 mg tablet. Monitor lipid panel.    Hypothyroidism  Recent TSH level 2.39 ( 11/23/2016).Continue on levothyroxine.Monitor TSH level.  Anxiety  Reports occasional panic attacks.continue on Zoloft. Psychiatry Referral for panic attack evaluation.   Family/ staff Communication: Reviewed plan of care with patient and facility Nurse supervisor.   Labs/tests ordered: urine specimen for microalbuminuria ratio

## 2017-02-06 DIAGNOSIS — M6281 Muscle weakness (generalized): Secondary | ICD-10-CM | POA: Diagnosis not present

## 2017-02-06 DIAGNOSIS — G894 Chronic pain syndrome: Secondary | ICD-10-CM | POA: Diagnosis not present

## 2017-02-07 DIAGNOSIS — M6281 Muscle weakness (generalized): Secondary | ICD-10-CM | POA: Diagnosis not present

## 2017-02-09 DIAGNOSIS — M6281 Muscle weakness (generalized): Secondary | ICD-10-CM | POA: Diagnosis not present

## 2017-02-09 DIAGNOSIS — M545 Low back pain: Secondary | ICD-10-CM | POA: Diagnosis not present

## 2017-02-10 DIAGNOSIS — M6281 Muscle weakness (generalized): Secondary | ICD-10-CM | POA: Diagnosis not present

## 2017-02-10 DIAGNOSIS — M545 Low back pain: Secondary | ICD-10-CM | POA: Diagnosis not present

## 2017-02-12 DIAGNOSIS — M545 Low back pain: Secondary | ICD-10-CM | POA: Diagnosis not present

## 2017-02-12 DIAGNOSIS — M6281 Muscle weakness (generalized): Secondary | ICD-10-CM | POA: Diagnosis not present

## 2017-02-13 DIAGNOSIS — M545 Low back pain: Secondary | ICD-10-CM | POA: Diagnosis not present

## 2017-02-13 DIAGNOSIS — M6281 Muscle weakness (generalized): Secondary | ICD-10-CM | POA: Diagnosis not present

## 2017-02-14 DIAGNOSIS — M545 Low back pain: Secondary | ICD-10-CM | POA: Diagnosis not present

## 2017-02-14 DIAGNOSIS — M6281 Muscle weakness (generalized): Secondary | ICD-10-CM | POA: Diagnosis not present

## 2017-02-15 DIAGNOSIS — M545 Low back pain: Secondary | ICD-10-CM | POA: Diagnosis not present

## 2017-02-15 DIAGNOSIS — M6281 Muscle weakness (generalized): Secondary | ICD-10-CM | POA: Diagnosis not present

## 2017-02-16 DIAGNOSIS — Z9842 Cataract extraction status, left eye: Secondary | ICD-10-CM | POA: Diagnosis not present

## 2017-02-16 DIAGNOSIS — Z9841 Cataract extraction status, right eye: Secondary | ICD-10-CM | POA: Diagnosis not present

## 2017-02-16 DIAGNOSIS — M6281 Muscle weakness (generalized): Secondary | ICD-10-CM | POA: Diagnosis not present

## 2017-02-16 DIAGNOSIS — Z961 Presence of intraocular lens: Secondary | ICD-10-CM | POA: Diagnosis not present

## 2017-02-16 DIAGNOSIS — E119 Type 2 diabetes mellitus without complications: Secondary | ICD-10-CM | POA: Diagnosis not present

## 2017-02-16 DIAGNOSIS — Z48810 Encounter for surgical aftercare following surgery on the sense organs: Secondary | ICD-10-CM | POA: Diagnosis not present

## 2017-02-16 DIAGNOSIS — H04123 Dry eye syndrome of bilateral lacrimal glands: Secondary | ICD-10-CM | POA: Diagnosis not present

## 2017-02-16 DIAGNOSIS — Z794 Long term (current) use of insulin: Secondary | ICD-10-CM | POA: Diagnosis not present

## 2017-02-16 DIAGNOSIS — M545 Low back pain: Secondary | ICD-10-CM | POA: Diagnosis not present

## 2017-02-19 DIAGNOSIS — M545 Low back pain: Secondary | ICD-10-CM | POA: Diagnosis not present

## 2017-02-19 DIAGNOSIS — M6281 Muscle weakness (generalized): Secondary | ICD-10-CM | POA: Diagnosis not present

## 2017-02-20 DIAGNOSIS — M6281 Muscle weakness (generalized): Secondary | ICD-10-CM | POA: Diagnosis not present

## 2017-02-20 DIAGNOSIS — M545 Low back pain: Secondary | ICD-10-CM | POA: Diagnosis not present

## 2017-02-21 DIAGNOSIS — M6281 Muscle weakness (generalized): Secondary | ICD-10-CM | POA: Diagnosis not present

## 2017-02-21 DIAGNOSIS — M545 Low back pain: Secondary | ICD-10-CM | POA: Diagnosis not present

## 2017-02-22 DIAGNOSIS — M6281 Muscle weakness (generalized): Secondary | ICD-10-CM | POA: Diagnosis not present

## 2017-02-22 DIAGNOSIS — M545 Low back pain: Secondary | ICD-10-CM | POA: Diagnosis not present

## 2017-02-23 DIAGNOSIS — M545 Low back pain: Secondary | ICD-10-CM | POA: Diagnosis not present

## 2017-02-23 DIAGNOSIS — M6281 Muscle weakness (generalized): Secondary | ICD-10-CM | POA: Diagnosis not present

## 2017-03-13 DIAGNOSIS — E119 Type 2 diabetes mellitus without complications: Secondary | ICD-10-CM | POA: Diagnosis not present

## 2017-03-13 DIAGNOSIS — E1165 Type 2 diabetes mellitus with hyperglycemia: Secondary | ICD-10-CM | POA: Diagnosis not present

## 2017-03-13 DIAGNOSIS — L0292 Furuncle, unspecified: Secondary | ICD-10-CM | POA: Diagnosis not present

## 2017-03-13 DIAGNOSIS — E039 Hypothyroidism, unspecified: Secondary | ICD-10-CM | POA: Diagnosis not present

## 2017-03-15 DIAGNOSIS — E1151 Type 2 diabetes mellitus with diabetic peripheral angiopathy without gangrene: Secondary | ICD-10-CM | POA: Diagnosis not present

## 2017-03-15 DIAGNOSIS — I10 Essential (primary) hypertension: Secondary | ICD-10-CM | POA: Diagnosis not present

## 2017-03-20 DIAGNOSIS — R278 Other lack of coordination: Secondary | ICD-10-CM | POA: Diagnosis not present

## 2017-03-20 DIAGNOSIS — I69954 Hemiplegia and hemiparesis following unspecified cerebrovascular disease affecting left non-dominant side: Secondary | ICD-10-CM | POA: Diagnosis not present

## 2017-03-21 DIAGNOSIS — R278 Other lack of coordination: Secondary | ICD-10-CM | POA: Diagnosis not present

## 2017-03-21 DIAGNOSIS — I69954 Hemiplegia and hemiparesis following unspecified cerebrovascular disease affecting left non-dominant side: Secondary | ICD-10-CM | POA: Diagnosis not present

## 2017-03-22 DIAGNOSIS — R278 Other lack of coordination: Secondary | ICD-10-CM | POA: Diagnosis not present

## 2017-03-22 DIAGNOSIS — I69954 Hemiplegia and hemiparesis following unspecified cerebrovascular disease affecting left non-dominant side: Secondary | ICD-10-CM | POA: Diagnosis not present

## 2017-03-23 DIAGNOSIS — R278 Other lack of coordination: Secondary | ICD-10-CM | POA: Diagnosis not present

## 2017-03-23 DIAGNOSIS — I69954 Hemiplegia and hemiparesis following unspecified cerebrovascular disease affecting left non-dominant side: Secondary | ICD-10-CM | POA: Diagnosis not present

## 2017-03-26 DIAGNOSIS — R278 Other lack of coordination: Secondary | ICD-10-CM | POA: Diagnosis not present

## 2017-03-26 DIAGNOSIS — I69954 Hemiplegia and hemiparesis following unspecified cerebrovascular disease affecting left non-dominant side: Secondary | ICD-10-CM | POA: Diagnosis not present

## 2017-03-27 DIAGNOSIS — I69954 Hemiplegia and hemiparesis following unspecified cerebrovascular disease affecting left non-dominant side: Secondary | ICD-10-CM | POA: Diagnosis not present

## 2017-03-27 DIAGNOSIS — R278 Other lack of coordination: Secondary | ICD-10-CM | POA: Diagnosis not present

## 2017-03-28 DIAGNOSIS — I69954 Hemiplegia and hemiparesis following unspecified cerebrovascular disease affecting left non-dominant side: Secondary | ICD-10-CM | POA: Diagnosis not present

## 2017-03-28 DIAGNOSIS — R278 Other lack of coordination: Secondary | ICD-10-CM | POA: Diagnosis not present

## 2017-03-29 DIAGNOSIS — F319 Bipolar disorder, unspecified: Secondary | ICD-10-CM | POA: Diagnosis not present

## 2017-03-29 DIAGNOSIS — G4700 Insomnia, unspecified: Secondary | ICD-10-CM | POA: Diagnosis not present

## 2017-03-29 DIAGNOSIS — I69954 Hemiplegia and hemiparesis following unspecified cerebrovascular disease affecting left non-dominant side: Secondary | ICD-10-CM | POA: Diagnosis not present

## 2017-03-29 DIAGNOSIS — F339 Major depressive disorder, recurrent, unspecified: Secondary | ICD-10-CM | POA: Diagnosis not present

## 2017-03-29 DIAGNOSIS — F411 Generalized anxiety disorder: Secondary | ICD-10-CM | POA: Diagnosis not present

## 2017-03-29 DIAGNOSIS — R278 Other lack of coordination: Secondary | ICD-10-CM | POA: Diagnosis not present

## 2017-03-30 DIAGNOSIS — R278 Other lack of coordination: Secondary | ICD-10-CM | POA: Diagnosis not present

## 2017-03-30 DIAGNOSIS — I69954 Hemiplegia and hemiparesis following unspecified cerebrovascular disease affecting left non-dominant side: Secondary | ICD-10-CM | POA: Diagnosis not present

## 2017-04-02 DIAGNOSIS — I69954 Hemiplegia and hemiparesis following unspecified cerebrovascular disease affecting left non-dominant side: Secondary | ICD-10-CM | POA: Diagnosis not present

## 2017-04-02 DIAGNOSIS — R278 Other lack of coordination: Secondary | ICD-10-CM | POA: Diagnosis not present

## 2017-04-03 DIAGNOSIS — L400 Psoriasis vulgaris: Secondary | ICD-10-CM | POA: Diagnosis not present

## 2017-04-03 DIAGNOSIS — I69954 Hemiplegia and hemiparesis following unspecified cerebrovascular disease affecting left non-dominant side: Secondary | ICD-10-CM | POA: Diagnosis not present

## 2017-04-03 DIAGNOSIS — R278 Other lack of coordination: Secondary | ICD-10-CM | POA: Diagnosis not present

## 2017-04-04 DIAGNOSIS — R278 Other lack of coordination: Secondary | ICD-10-CM | POA: Diagnosis not present

## 2017-04-04 DIAGNOSIS — I69954 Hemiplegia and hemiparesis following unspecified cerebrovascular disease affecting left non-dominant side: Secondary | ICD-10-CM | POA: Diagnosis not present

## 2017-04-05 DIAGNOSIS — I69954 Hemiplegia and hemiparesis following unspecified cerebrovascular disease affecting left non-dominant side: Secondary | ICD-10-CM | POA: Diagnosis not present

## 2017-04-05 DIAGNOSIS — R278 Other lack of coordination: Secondary | ICD-10-CM | POA: Diagnosis not present

## 2017-04-06 DIAGNOSIS — R278 Other lack of coordination: Secondary | ICD-10-CM | POA: Diagnosis not present

## 2017-04-06 DIAGNOSIS — E038 Other specified hypothyroidism: Secondary | ICD-10-CM | POA: Diagnosis not present

## 2017-04-06 DIAGNOSIS — E784 Other hyperlipidemia: Secondary | ICD-10-CM | POA: Diagnosis not present

## 2017-04-06 DIAGNOSIS — I69954 Hemiplegia and hemiparesis following unspecified cerebrovascular disease affecting left non-dominant side: Secondary | ICD-10-CM | POA: Diagnosis not present

## 2017-04-06 DIAGNOSIS — E039 Hypothyroidism, unspecified: Secondary | ICD-10-CM | POA: Diagnosis not present

## 2017-04-06 DIAGNOSIS — E1165 Type 2 diabetes mellitus with hyperglycemia: Secondary | ICD-10-CM | POA: Diagnosis not present

## 2017-04-06 DIAGNOSIS — N390 Urinary tract infection, site not specified: Secondary | ICD-10-CM | POA: Diagnosis not present

## 2017-04-06 DIAGNOSIS — E1151 Type 2 diabetes mellitus with diabetic peripheral angiopathy without gangrene: Secondary | ICD-10-CM | POA: Diagnosis not present

## 2017-04-06 DIAGNOSIS — A0682 Other amebic genitourinary infections: Secondary | ICD-10-CM | POA: Diagnosis not present

## 2017-04-06 DIAGNOSIS — I10 Essential (primary) hypertension: Secondary | ICD-10-CM | POA: Diagnosis not present

## 2017-04-09 DIAGNOSIS — R278 Other lack of coordination: Secondary | ICD-10-CM | POA: Diagnosis not present

## 2017-04-09 DIAGNOSIS — I69954 Hemiplegia and hemiparesis following unspecified cerebrovascular disease affecting left non-dominant side: Secondary | ICD-10-CM | POA: Diagnosis not present

## 2017-04-10 DIAGNOSIS — I69954 Hemiplegia and hemiparesis following unspecified cerebrovascular disease affecting left non-dominant side: Secondary | ICD-10-CM | POA: Diagnosis not present

## 2017-04-10 DIAGNOSIS — R278 Other lack of coordination: Secondary | ICD-10-CM | POA: Diagnosis not present

## 2017-04-11 DIAGNOSIS — R278 Other lack of coordination: Secondary | ICD-10-CM | POA: Diagnosis not present

## 2017-04-11 DIAGNOSIS — M79621 Pain in right upper arm: Secondary | ICD-10-CM | POA: Diagnosis not present

## 2017-04-11 DIAGNOSIS — M79642 Pain in left hand: Secondary | ICD-10-CM | POA: Diagnosis not present

## 2017-04-11 DIAGNOSIS — M79622 Pain in left upper arm: Secondary | ICD-10-CM | POA: Diagnosis not present

## 2017-04-12 DIAGNOSIS — R278 Other lack of coordination: Secondary | ICD-10-CM | POA: Diagnosis not present

## 2017-04-12 DIAGNOSIS — M79621 Pain in right upper arm: Secondary | ICD-10-CM | POA: Diagnosis not present

## 2017-04-12 DIAGNOSIS — M79642 Pain in left hand: Secondary | ICD-10-CM | POA: Diagnosis not present

## 2017-04-12 DIAGNOSIS — M79622 Pain in left upper arm: Secondary | ICD-10-CM | POA: Diagnosis not present

## 2017-04-13 DIAGNOSIS — M79622 Pain in left upper arm: Secondary | ICD-10-CM | POA: Diagnosis not present

## 2017-04-13 DIAGNOSIS — I2510 Atherosclerotic heart disease of native coronary artery without angina pectoris: Secondary | ICD-10-CM | POA: Diagnosis not present

## 2017-04-13 DIAGNOSIS — M79642 Pain in left hand: Secondary | ICD-10-CM | POA: Diagnosis not present

## 2017-04-13 DIAGNOSIS — R278 Other lack of coordination: Secondary | ICD-10-CM | POA: Diagnosis not present

## 2017-04-13 DIAGNOSIS — G8194 Hemiplegia, unspecified affecting left nondominant side: Secondary | ICD-10-CM | POA: Diagnosis not present

## 2017-04-13 DIAGNOSIS — Z7409 Other reduced mobility: Secondary | ICD-10-CM | POA: Diagnosis not present

## 2017-04-13 DIAGNOSIS — J449 Chronic obstructive pulmonary disease, unspecified: Secondary | ICD-10-CM | POA: Diagnosis not present

## 2017-04-13 DIAGNOSIS — M79621 Pain in right upper arm: Secondary | ICD-10-CM | POA: Diagnosis not present

## 2017-04-16 DIAGNOSIS — R278 Other lack of coordination: Secondary | ICD-10-CM | POA: Diagnosis not present

## 2017-04-16 DIAGNOSIS — M79622 Pain in left upper arm: Secondary | ICD-10-CM | POA: Diagnosis not present

## 2017-04-16 DIAGNOSIS — M79621 Pain in right upper arm: Secondary | ICD-10-CM | POA: Diagnosis not present

## 2017-04-16 DIAGNOSIS — M79642 Pain in left hand: Secondary | ICD-10-CM | POA: Diagnosis not present

## 2017-04-18 DIAGNOSIS — M79622 Pain in left upper arm: Secondary | ICD-10-CM | POA: Diagnosis not present

## 2017-04-18 DIAGNOSIS — R278 Other lack of coordination: Secondary | ICD-10-CM | POA: Diagnosis not present

## 2017-04-18 DIAGNOSIS — M79621 Pain in right upper arm: Secondary | ICD-10-CM | POA: Diagnosis not present

## 2017-04-18 DIAGNOSIS — M79642 Pain in left hand: Secondary | ICD-10-CM | POA: Diagnosis not present

## 2017-04-19 DIAGNOSIS — M79621 Pain in right upper arm: Secondary | ICD-10-CM | POA: Diagnosis not present

## 2017-04-19 DIAGNOSIS — M79622 Pain in left upper arm: Secondary | ICD-10-CM | POA: Diagnosis not present

## 2017-04-19 DIAGNOSIS — M79642 Pain in left hand: Secondary | ICD-10-CM | POA: Diagnosis not present

## 2017-04-19 DIAGNOSIS — R278 Other lack of coordination: Secondary | ICD-10-CM | POA: Diagnosis not present

## 2017-04-20 DIAGNOSIS — M79622 Pain in left upper arm: Secondary | ICD-10-CM | POA: Diagnosis not present

## 2017-04-20 DIAGNOSIS — M79642 Pain in left hand: Secondary | ICD-10-CM | POA: Diagnosis not present

## 2017-04-20 DIAGNOSIS — R278 Other lack of coordination: Secondary | ICD-10-CM | POA: Diagnosis not present

## 2017-04-20 DIAGNOSIS — M79621 Pain in right upper arm: Secondary | ICD-10-CM | POA: Diagnosis not present

## 2017-04-23 DIAGNOSIS — R278 Other lack of coordination: Secondary | ICD-10-CM | POA: Diagnosis not present

## 2017-04-23 DIAGNOSIS — M79621 Pain in right upper arm: Secondary | ICD-10-CM | POA: Diagnosis not present

## 2017-04-23 DIAGNOSIS — M79642 Pain in left hand: Secondary | ICD-10-CM | POA: Diagnosis not present

## 2017-04-23 DIAGNOSIS — M79622 Pain in left upper arm: Secondary | ICD-10-CM | POA: Diagnosis not present

## 2017-05-02 DIAGNOSIS — M79642 Pain in left hand: Secondary | ICD-10-CM | POA: Diagnosis not present

## 2017-05-02 DIAGNOSIS — M79622 Pain in left upper arm: Secondary | ICD-10-CM | POA: Diagnosis not present

## 2017-05-02 DIAGNOSIS — M79621 Pain in right upper arm: Secondary | ICD-10-CM | POA: Diagnosis not present

## 2017-05-02 DIAGNOSIS — R278 Other lack of coordination: Secondary | ICD-10-CM | POA: Diagnosis not present

## 2017-05-03 ENCOUNTER — Telehealth: Payer: Self-pay | Admitting: General Practice

## 2017-05-03 NOTE — Telephone Encounter (Signed)
Ext Sheridan, scheduler, faxed referral to [phone] on Aug 21st.  Would like a call back w/ update on whether it was received.  Ty,  -LL

## 2017-05-24 DIAGNOSIS — J449 Chronic obstructive pulmonary disease, unspecified: Secondary | ICD-10-CM | POA: Diagnosis not present

## 2017-05-24 DIAGNOSIS — Z7409 Other reduced mobility: Secondary | ICD-10-CM | POA: Diagnosis not present

## 2017-05-24 DIAGNOSIS — R3981 Functional urinary incontinence: Secondary | ICD-10-CM | POA: Diagnosis not present

## 2017-05-24 DIAGNOSIS — I2510 Atherosclerotic heart disease of native coronary artery without angina pectoris: Secondary | ICD-10-CM | POA: Diagnosis not present

## 2017-05-29 DIAGNOSIS — M6281 Muscle weakness (generalized): Secondary | ICD-10-CM | POA: Diagnosis not present

## 2017-05-30 DIAGNOSIS — M6281 Muscle weakness (generalized): Secondary | ICD-10-CM | POA: Diagnosis not present

## 2017-05-31 DIAGNOSIS — M6281 Muscle weakness (generalized): Secondary | ICD-10-CM | POA: Diagnosis not present

## 2017-06-01 DIAGNOSIS — M6281 Muscle weakness (generalized): Secondary | ICD-10-CM | POA: Diagnosis not present

## 2017-06-04 DIAGNOSIS — Z79899 Other long term (current) drug therapy: Secondary | ICD-10-CM | POA: Diagnosis not present

## 2017-06-04 DIAGNOSIS — M6281 Muscle weakness (generalized): Secondary | ICD-10-CM | POA: Diagnosis not present

## 2017-06-05 DIAGNOSIS — M6281 Muscle weakness (generalized): Secondary | ICD-10-CM | POA: Diagnosis not present

## 2017-06-06 DIAGNOSIS — M6281 Muscle weakness (generalized): Secondary | ICD-10-CM | POA: Diagnosis not present

## 2017-06-07 DIAGNOSIS — M6281 Muscle weakness (generalized): Secondary | ICD-10-CM | POA: Diagnosis not present

## 2017-06-08 DIAGNOSIS — M6281 Muscle weakness (generalized): Secondary | ICD-10-CM | POA: Diagnosis not present

## 2017-06-11 DIAGNOSIS — M6281 Muscle weakness (generalized): Secondary | ICD-10-CM | POA: Diagnosis not present

## 2017-06-11 DIAGNOSIS — I69954 Hemiplegia and hemiparesis following unspecified cerebrovascular disease affecting left non-dominant side: Secondary | ICD-10-CM | POA: Diagnosis not present

## 2017-06-12 DIAGNOSIS — I69954 Hemiplegia and hemiparesis following unspecified cerebrovascular disease affecting left non-dominant side: Secondary | ICD-10-CM | POA: Diagnosis not present

## 2017-06-12 DIAGNOSIS — M6281 Muscle weakness (generalized): Secondary | ICD-10-CM | POA: Diagnosis not present

## 2017-06-13 DIAGNOSIS — I69954 Hemiplegia and hemiparesis following unspecified cerebrovascular disease affecting left non-dominant side: Secondary | ICD-10-CM | POA: Diagnosis not present

## 2017-06-13 DIAGNOSIS — E1165 Type 2 diabetes mellitus with hyperglycemia: Secondary | ICD-10-CM | POA: Diagnosis not present

## 2017-06-13 DIAGNOSIS — M6281 Muscle weakness (generalized): Secondary | ICD-10-CM | POA: Diagnosis not present

## 2017-06-14 ENCOUNTER — Encounter: Payer: Self-pay | Admitting: Endocrinology

## 2017-06-14 ENCOUNTER — Ambulatory Visit (INDEPENDENT_AMBULATORY_CARE_PROVIDER_SITE_OTHER): Payer: Medicare Other | Admitting: Endocrinology

## 2017-06-14 ENCOUNTER — Telehealth: Payer: Self-pay

## 2017-06-14 VITALS — BP 114/64 | HR 80

## 2017-06-14 DIAGNOSIS — IMO0002 Reserved for concepts with insufficient information to code with codable children: Secondary | ICD-10-CM

## 2017-06-14 DIAGNOSIS — E1149 Type 2 diabetes mellitus with other diabetic neurological complication: Secondary | ICD-10-CM

## 2017-06-14 DIAGNOSIS — E1165 Type 2 diabetes mellitus with hyperglycemia: Secondary | ICD-10-CM | POA: Diagnosis not present

## 2017-06-14 DIAGNOSIS — I69954 Hemiplegia and hemiparesis following unspecified cerebrovascular disease affecting left non-dominant side: Secondary | ICD-10-CM | POA: Diagnosis not present

## 2017-06-14 DIAGNOSIS — M6281 Muscle weakness (generalized): Secondary | ICD-10-CM | POA: Diagnosis not present

## 2017-06-14 LAB — POCT GLYCOSYLATED HEMOGLOBIN (HGB A1C): Hemoglobin A1C: 7.4

## 2017-06-14 NOTE — Progress Notes (Signed)
Subjective:    Patient ID: Susan [name], female    DOB: 1951/04/06, 66 y.o.   MRN: [id number]  HPI pt is referred by Damaris Hippo, NP, for diabetes.  Pt states DM was dx'ed in 2012; she has moderate neuropathy of the lower extremities; she has associated CAD, nephropathy, and CVA; she has been on insulin since 2014; pt says her diet is brought to her by staff at [facility]; exercise is limited by health probs; she has never had GDM, pancreatic surgery, or DKA.  She had pancreatitis twice (2011 and 2012; she says no cause was found).  Last episode of severe hypoglycemia was 2016 Past Medical History:  Diagnosis Date  . Anemia   . CAD (coronary artery disease)   . Chronic pain   . COPD (chronic obstructive pulmonary disease) ([place])   . Depression   . Diabetes mellitus without complication ([place])   . Fall   . Hemiplegia affecting non-dominant side, post-stroke   . Hyperlipidemia   . Hypertension   . Stroke ([place]) 12/2013   left side paralysis  . Thyroid disease   . Urine retention     Past Surgical History:  Procedure Laterality Date  . ABDOMINAL HYSTERECTOMY    . APPENDECTOMY    . CATARACT EXTRACTION Right 12/07/2016  . CHOLECYSTECTOMY    . JOINT REPLACEMENT     bil hip  . TONSILLECTOMY      Social History   Social History  . Marital status: Married    Spouse name: N/A  . Number of children: N/A  . Years of education: N/A   Occupational History  . Not on file.   Social History Main Topics  . Smoking status: Former Smoker    Packs/day: 4.00    Years: 40.00    Types: Cigarettes  . Smokeless tobacco: Never Used  . Alcohol use No  . Drug use: No  . Sexual activity: Not Currently   Other Topics Concern  . Not on file   Social History Narrative  . No narrative on file    Current Outpatient Prescriptions on File Prior to Visit  Medication Sig Dispense Refill  . acetaminophen (TYLENOL) 325 MG tablet Take 650 mg by mouth every 4 (four) hours as needed for mild  pain.    Marland Kitchen albuterol (VENTOLIN HFA) 108 (90 Base) MCG/ACT inhaler Inhale 2 puffs into the lungs every 8 (eight) hours as needed for wheezing or shortness of breath.    Marland Kitchen aspirin 81 MG chewable tablet Chew 81 mg by mouth daily.     Marland Kitchen atorvastatin (LIPITOR) 10 MG tablet Take 10 mg by mouth daily.    . baclofen (LIORESAL) 10 MG tablet Take 10 mg by mouth 3 (three) times daily. For muscle spasms    . cyclobenzaprine (FLEXERIL) 5 MG tablet Take 5 mg by mouth 2 (two) times daily.     . diphenhydrAMINE (BENADRYL) 25 MG tablet Take 25 mg by mouth every 6 (six) hours as needed. While awake for sleep     . docusate sodium (COLACE) 100 MG capsule Take 2 capsules (200 mg total) by mouth 2 (two) times daily. 100 capsule 11  . fluticasone (FLONASE) 50 MCG/ACT nasal spray Place 2 sprays into both nostrils daily. For chronic sinus infections    . furosemide (LASIX) 20 MG tablet Take 20 mg by mouth daily.    Marland Kitchen gabapentin (NEURONTIN) 400 MG capsule Take 1 (400 mg) capsule by mouth three times a day with meals. Also take  3 (1200mg ) capsules by mouth daily at bedtime.    Marland Kitchen glimepiride (AMARYL) 2 MG tablet Take 2 mg by mouth daily with breakfast.     . hydrocortisone 2.5 % lotion Apply 1 application topically 2 (two) times daily as needed.    . Insulin Glargine (TOUJEO SOLOSTAR) 300 UNIT/ML SOPN Inject 128 Units into the skin at bedtime.     . insulin lispro (HUMALOG) 100 UNIT/ML injection Inject 36 Units into the skin 3 (three) times daily before meals. For CBG >/= 150 inject 10 additional units subcutaneously    . isosorbide mononitrate (IMDUR) 30 MG 24 hr tablet Take 30 mg by mouth daily.     Marland Kitchen ketorolac (ACULAR) 0.4 % SOLN Apply 1 drop to eye 4 (four) times daily. Right eye    . levothyroxine (SYNTHROID, LEVOTHROID) 25 MCG tablet Take 25 mcg by mouth daily before breakfast.     . Linaclotide (LINZESS) 145 MCG CAPS capsule Take 145 mcg by mouth daily. Give 30 minutes prior to first meal and taken on an empty  stomach.    . linagliptin (TRADJENTA) 5 MG TABS tablet Take 5 mg by mouth daily.     . Melatonin 3 MG TABS Take 6 mg by mouth at bedtime.     . Menthol, Topical Analgesic, (BIOFREEZE) 4 % GEL Apply topically 4 (four) times daily. Apply to both feet.    . metFORMIN (GLUCOPHAGE) 1000 MG tablet Take 1,000 mg by mouth 2 (two) times daily with a meal.     . moxifloxacin (VIGAMOX) 0.5 % ophthalmic solution Place 1 drop into the left eye 4 (four) times daily. While awake     . nitroGLYCERIN (NITROSTAT) 0.4 MG SL tablet Place 0.4 mg under the tongue every 5 (five) minutes as needed for chest pain. Notify MD if not relieved by NTG    . omeprazole (PRILOSEC) 20 MG capsule Take 20 mg by mouth daily.     . Oxycodone HCl 10 MG TABS Take 10 mg by mouth. Daily at 12 am, 8 am, 12 pm, 4 pm, and 8 pm,    . OXYGEN Inhale 2 L into the lungs as needed (for O2 sats 88% or lower).     Vladimir Faster Glycol-Propyl Glycol (SYSTANE ULTRA OP) Place 1 drop into both eyes at bedtime as needed. Wait 3 to 5 minutes between 2 eye meds    . prednisoLONE acetate (PRED FORTE) 1 % ophthalmic suspension Place 1 drop into the left eye as directed. 1 drop QID x1 week; then TID x1 week; then BID x1 week; then QD x1 week; then stop    . promethazine (PHENERGAN) 12.5 MG tablet Take 12.5 mg by mouth every 6 (six) hours as needed for nausea or vomiting.    . sertraline (ZOLOFT) 100 MG tablet Take 100 mg by mouth daily. Take by mouth daily  along with 50 mg tablet to equal 150 mg total.    . sertraline (ZOLOFT) 50 MG tablet Take 50 mg by mouth daily. Take along with 100mg  to make 150mg  total    . spironolactone (ALDACTONE) 25 MG tablet Take 25 mg by mouth 2 (two) times daily. For edema    . tiotropium (SPIRIVA) 18 MCG inhalation capsule Inhale 1 capsule (18 MCG CP Handihaler)  contents by taking two separate inhalations via handihaler device daily for COPD     No current facility-administered medications on file prior to visit.     Allergies    Allergen Reactions  . Onion  Shortness Of Breath and Swelling  . Pollen Extract Itching  . Morphine And Related Hives    PT DENIES ALLERGY  . Niaspan [Niacin Er] Other (See Comments)    unknown  . Other     onions  . Trazodone And Nefazodone Other (See Comments)    Hallucinations  . Valium [Diazepam] Other (See Comments)    unknown  . Wellbutrin [Bupropion] Other (See Comments)    unknown    Family History  Problem Relation Age of Onset  . Diabetes Mother     BP 114/64   Pulse 80   SpO2 92%    Review of Systems denies blurry vision, chest pain, sob, n/v, urinary frequency, excessive diaphoresis, cold intolerance, rhinorrhea, and easy bruising. She has weight gain, leg cramps, depression, and intermitt headache.     Objective:   Physical Exam VS: see vs page GEN: no distress.  In wheelchair HEAD: head: no deformity eyes: no periorbital swelling, no proptosis external nose and ears are normal mouth: no lesion seen NECK: supple, thyroid is not enlarged CHEST WALL: no deformity LUNGS: clear to auscultation CV: reg rate and rhythm, no murmur ABD: abdomen is soft, nontender.  no hepatosplenomegaly.  not distended.  no hernia MUSCULOSKELETAL: muscle bulk and strength are grossly normal.  no obvious joint swelling.  gait is normal and steady EXTEMITIES: no deformity.  no ulcer on the feet.  Right foot has normal color and temp, but the left foot is cool to touch.  No leg edema PULSES: dorsalis pedis intact bilat.  no carotid bruit NEURO:  cn 2-12 grossly intact, except for left facial weakness.   readily moves all 4's, but strength is severely decreased on the entire left side of the face and body.  sensation is intact to touch on the feet, but decreased from normal.  SKIN:  Normal texture and temperature.  No rash or suspicious lesion is visible.   NODES:  None palpable at the neck PSYCH: alert, well-oriented.  Does not appear anxious nor depressed.    Lab Results   Component Value Date   HGBA1C 7.4 06/14/2017   I personally reviewed electrocardiogram tracing (08/30/14): Indication: palpitations Impression: ST.  No MI.  LVH Compared to 05/16/14: LVH is new  I have reviewed outside records, and summarized: Pt was noted to have elevated a1c, and referred here.  cbg record sent with patient is not recent.       Assessment & Plan:  Insulin-requiring type 2 DM, with CVA: Based on the pattern of her cbg's, she probably needs some adjustment in her therapy.  We have requested current cbg record.     Patient Instructions  We will contact [facility] when we get current cbg record

## 2017-06-14 NOTE — Patient Instructions (Signed)
We will contact [facility] when we get current cbg record

## 2017-06-14 NOTE — Telephone Encounter (Signed)
[place] to have them fax most current CBG's. I spoke with nurse supervisor who is supposed to f/u & have this done.

## 2017-06-15 DIAGNOSIS — I69954 Hemiplegia and hemiparesis following unspecified cerebrovascular disease affecting left non-dominant side: Secondary | ICD-10-CM | POA: Diagnosis not present

## 2017-06-15 DIAGNOSIS — M6281 Muscle weakness (generalized): Secondary | ICD-10-CM | POA: Diagnosis not present

## 2017-06-18 DIAGNOSIS — I69954 Hemiplegia and hemiparesis following unspecified cerebrovascular disease affecting left non-dominant side: Secondary | ICD-10-CM | POA: Diagnosis not present

## 2017-06-18 DIAGNOSIS — M6281 Muscle weakness (generalized): Secondary | ICD-10-CM | POA: Diagnosis not present

## 2017-06-19 DIAGNOSIS — M6281 Muscle weakness (generalized): Secondary | ICD-10-CM | POA: Diagnosis not present

## 2017-06-19 DIAGNOSIS — I69954 Hemiplegia and hemiparesis following unspecified cerebrovascular disease affecting left non-dominant side: Secondary | ICD-10-CM | POA: Diagnosis not present

## 2017-06-20 DIAGNOSIS — M6281 Muscle weakness (generalized): Secondary | ICD-10-CM | POA: Diagnosis not present

## 2017-06-20 DIAGNOSIS — I69954 Hemiplegia and hemiparesis following unspecified cerebrovascular disease affecting left non-dominant side: Secondary | ICD-10-CM | POA: Diagnosis not present

## 2017-06-21 DIAGNOSIS — D485 Neoplasm of uncertain behavior of skin: Secondary | ICD-10-CM | POA: Diagnosis not present

## 2017-06-21 DIAGNOSIS — R21 Rash and other nonspecific skin eruption: Secondary | ICD-10-CM | POA: Diagnosis not present

## 2017-06-21 DIAGNOSIS — L409 Psoriasis, unspecified: Secondary | ICD-10-CM | POA: Diagnosis not present

## 2017-06-21 DIAGNOSIS — L400 Psoriasis vulgaris: Secondary | ICD-10-CM | POA: Diagnosis not present

## 2017-06-21 DIAGNOSIS — M6281 Muscle weakness (generalized): Secondary | ICD-10-CM | POA: Diagnosis not present

## 2017-06-21 DIAGNOSIS — I69954 Hemiplegia and hemiparesis following unspecified cerebrovascular disease affecting left non-dominant side: Secondary | ICD-10-CM | POA: Diagnosis not present

## 2017-06-22 DIAGNOSIS — I69954 Hemiplegia and hemiparesis following unspecified cerebrovascular disease affecting left non-dominant side: Secondary | ICD-10-CM | POA: Diagnosis not present

## 2017-06-22 DIAGNOSIS — M6281 Muscle weakness (generalized): Secondary | ICD-10-CM | POA: Diagnosis not present

## 2017-06-25 DIAGNOSIS — I69954 Hemiplegia and hemiparesis following unspecified cerebrovascular disease affecting left non-dominant side: Secondary | ICD-10-CM | POA: Diagnosis not present

## 2017-06-25 DIAGNOSIS — M6281 Muscle weakness (generalized): Secondary | ICD-10-CM | POA: Diagnosis not present

## 2017-06-26 DIAGNOSIS — I69954 Hemiplegia and hemiparesis following unspecified cerebrovascular disease affecting left non-dominant side: Secondary | ICD-10-CM | POA: Diagnosis not present

## 2017-06-26 DIAGNOSIS — M6281 Muscle weakness (generalized): Secondary | ICD-10-CM | POA: Diagnosis not present

## 2017-06-27 DIAGNOSIS — M6281 Muscle weakness (generalized): Secondary | ICD-10-CM | POA: Diagnosis not present

## 2017-06-27 DIAGNOSIS — I69954 Hemiplegia and hemiparesis following unspecified cerebrovascular disease affecting left non-dominant side: Secondary | ICD-10-CM | POA: Diagnosis not present

## 2017-06-28 DIAGNOSIS — I69954 Hemiplegia and hemiparesis following unspecified cerebrovascular disease affecting left non-dominant side: Secondary | ICD-10-CM | POA: Diagnosis not present

## 2017-06-28 DIAGNOSIS — M6281 Muscle weakness (generalized): Secondary | ICD-10-CM | POA: Diagnosis not present

## 2017-06-29 DIAGNOSIS — I69954 Hemiplegia and hemiparesis following unspecified cerebrovascular disease affecting left non-dominant side: Secondary | ICD-10-CM | POA: Diagnosis not present

## 2017-06-29 DIAGNOSIS — M6281 Muscle weakness (generalized): Secondary | ICD-10-CM | POA: Diagnosis not present

## 2017-07-02 DIAGNOSIS — Z23 Encounter for immunization: Secondary | ICD-10-CM | POA: Diagnosis not present

## 2017-07-02 DIAGNOSIS — M6281 Muscle weakness (generalized): Secondary | ICD-10-CM | POA: Diagnosis not present

## 2017-07-02 DIAGNOSIS — I69954 Hemiplegia and hemiparesis following unspecified cerebrovascular disease affecting left non-dominant side: Secondary | ICD-10-CM | POA: Diagnosis not present

## 2017-07-03 DIAGNOSIS — M6281 Muscle weakness (generalized): Secondary | ICD-10-CM | POA: Diagnosis not present

## 2017-07-03 DIAGNOSIS — I69954 Hemiplegia and hemiparesis following unspecified cerebrovascular disease affecting left non-dominant side: Secondary | ICD-10-CM | POA: Diagnosis not present

## 2017-07-04 DIAGNOSIS — I69954 Hemiplegia and hemiparesis following unspecified cerebrovascular disease affecting left non-dominant side: Secondary | ICD-10-CM | POA: Diagnosis not present

## 2017-07-04 DIAGNOSIS — M6281 Muscle weakness (generalized): Secondary | ICD-10-CM | POA: Diagnosis not present

## 2017-07-05 DIAGNOSIS — I69954 Hemiplegia and hemiparesis following unspecified cerebrovascular disease affecting left non-dominant side: Secondary | ICD-10-CM | POA: Diagnosis not present

## 2017-07-05 DIAGNOSIS — M6281 Muscle weakness (generalized): Secondary | ICD-10-CM | POA: Diagnosis not present

## 2017-07-06 DIAGNOSIS — M6281 Muscle weakness (generalized): Secondary | ICD-10-CM | POA: Diagnosis not present

## 2017-07-06 DIAGNOSIS — I69954 Hemiplegia and hemiparesis following unspecified cerebrovascular disease affecting left non-dominant side: Secondary | ICD-10-CM | POA: Diagnosis not present

## 2017-07-06 DIAGNOSIS — F064 Anxiety disorder due to known physiological condition: Secondary | ICD-10-CM | POA: Diagnosis not present

## 2017-07-06 DIAGNOSIS — F410 Panic disorder [episodic paroxysmal anxiety] without agoraphobia: Secondary | ICD-10-CM | POA: Diagnosis not present

## 2017-07-06 DIAGNOSIS — F3181 Bipolar II disorder: Secondary | ICD-10-CM | POA: Diagnosis not present

## 2017-07-09 DIAGNOSIS — I69954 Hemiplegia and hemiparesis following unspecified cerebrovascular disease affecting left non-dominant side: Secondary | ICD-10-CM | POA: Diagnosis not present

## 2017-07-09 DIAGNOSIS — M6281 Muscle weakness (generalized): Secondary | ICD-10-CM | POA: Diagnosis not present

## 2017-07-17 DIAGNOSIS — F3181 Bipolar II disorder: Secondary | ICD-10-CM | POA: Diagnosis not present

## 2017-07-17 DIAGNOSIS — F064 Anxiety disorder due to known physiological condition: Secondary | ICD-10-CM | POA: Diagnosis not present

## 2017-07-17 DIAGNOSIS — F410 Panic disorder [episodic paroxysmal anxiety] without agoraphobia: Secondary | ICD-10-CM | POA: Diagnosis not present

## 2017-07-18 DIAGNOSIS — R05 Cough: Secondary | ICD-10-CM | POA: Diagnosis not present

## 2017-07-26 DIAGNOSIS — I2510 Atherosclerotic heart disease of native coronary artery without angina pectoris: Secondary | ICD-10-CM | POA: Diagnosis not present

## 2017-07-26 DIAGNOSIS — J189 Pneumonia, unspecified organism: Secondary | ICD-10-CM | POA: Diagnosis not present

## 2017-07-26 DIAGNOSIS — J449 Chronic obstructive pulmonary disease, unspecified: Secondary | ICD-10-CM | POA: Diagnosis not present

## 2017-07-26 DIAGNOSIS — R3981 Functional urinary incontinence: Secondary | ICD-10-CM | POA: Diagnosis not present

## 2017-07-31 DIAGNOSIS — F064 Anxiety disorder due to known physiological condition: Secondary | ICD-10-CM | POA: Diagnosis not present

## 2017-07-31 DIAGNOSIS — F329 Major depressive disorder, single episode, unspecified: Secondary | ICD-10-CM | POA: Diagnosis not present

## 2017-08-03 DIAGNOSIS — J189 Pneumonia, unspecified organism: Secondary | ICD-10-CM | POA: Diagnosis not present

## 2017-08-14 DIAGNOSIS — F410 Panic disorder [episodic paroxysmal anxiety] without agoraphobia: Secondary | ICD-10-CM | POA: Diagnosis not present

## 2017-08-14 DIAGNOSIS — G4700 Insomnia, unspecified: Secondary | ICD-10-CM | POA: Diagnosis not present

## 2017-08-14 DIAGNOSIS — F064 Anxiety disorder due to known physiological condition: Secondary | ICD-10-CM | POA: Diagnosis not present

## 2017-08-14 DIAGNOSIS — F329 Major depressive disorder, single episode, unspecified: Secondary | ICD-10-CM | POA: Diagnosis not present

## 2017-08-28 DIAGNOSIS — F410 Panic disorder [episodic paroxysmal anxiety] without agoraphobia: Secondary | ICD-10-CM | POA: Diagnosis not present

## 2017-08-28 DIAGNOSIS — F064 Anxiety disorder due to known physiological condition: Secondary | ICD-10-CM | POA: Diagnosis not present

## 2017-09-12 DIAGNOSIS — F410 Panic disorder [episodic paroxysmal anxiety] without agoraphobia: Secondary | ICD-10-CM | POA: Diagnosis not present

## 2017-09-12 DIAGNOSIS — F064 Anxiety disorder due to known physiological condition: Secondary | ICD-10-CM | POA: Diagnosis not present

## 2017-09-12 DIAGNOSIS — F329 Major depressive disorder, single episode, unspecified: Secondary | ICD-10-CM | POA: Diagnosis not present

## 2017-09-13 DIAGNOSIS — R404 Transient alteration of awareness: Secondary | ICD-10-CM | POA: Diagnosis not present

## 2017-09-13 DIAGNOSIS — G4700 Insomnia, unspecified: Secondary | ICD-10-CM | POA: Diagnosis not present

## 2017-09-13 DIAGNOSIS — E1165 Type 2 diabetes mellitus with hyperglycemia: Secondary | ICD-10-CM | POA: Diagnosis not present

## 2017-09-24 DIAGNOSIS — G8194 Hemiplegia, unspecified affecting left nondominant side: Secondary | ICD-10-CM | POA: Diagnosis not present

## 2017-09-24 DIAGNOSIS — J441 Chronic obstructive pulmonary disease with (acute) exacerbation: Secondary | ICD-10-CM | POA: Diagnosis not present

## 2017-09-24 DIAGNOSIS — E039 Hypothyroidism, unspecified: Secondary | ICD-10-CM | POA: Diagnosis not present

## 2017-09-24 DIAGNOSIS — I69359 Hemiplegia and hemiparesis following cerebral infarction affecting unspecified side: Secondary | ICD-10-CM | POA: Diagnosis not present

## 2017-09-25 DIAGNOSIS — E039 Hypothyroidism, unspecified: Secondary | ICD-10-CM | POA: Diagnosis not present

## 2017-09-25 DIAGNOSIS — Z79899 Other long term (current) drug therapy: Secondary | ICD-10-CM | POA: Diagnosis not present

## 2017-09-27 DIAGNOSIS — F410 Panic disorder [episodic paroxysmal anxiety] without agoraphobia: Secondary | ICD-10-CM | POA: Diagnosis not present

## 2017-09-27 DIAGNOSIS — F3181 Bipolar II disorder: Secondary | ICD-10-CM | POA: Diagnosis not present

## 2017-09-27 DIAGNOSIS — F064 Anxiety disorder due to known physiological condition: Secondary | ICD-10-CM | POA: Diagnosis not present

## 2017-10-01 DIAGNOSIS — L603 Nail dystrophy: Secondary | ICD-10-CM | POA: Diagnosis not present

## 2017-10-01 DIAGNOSIS — E1140 Type 2 diabetes mellitus with diabetic neuropathy, unspecified: Secondary | ICD-10-CM | POA: Diagnosis not present

## 2017-10-01 DIAGNOSIS — B351 Tinea unguium: Secondary | ICD-10-CM | POA: Diagnosis not present

## 2017-10-01 DIAGNOSIS — Z794 Long term (current) use of insulin: Secondary | ICD-10-CM | POA: Diagnosis not present

## 2017-10-08 DIAGNOSIS — E039 Hypothyroidism, unspecified: Secondary | ICD-10-CM | POA: Diagnosis not present

## 2017-10-08 DIAGNOSIS — N390 Urinary tract infection, site not specified: Secondary | ICD-10-CM | POA: Diagnosis not present

## 2017-10-08 DIAGNOSIS — I10 Essential (primary) hypertension: Secondary | ICD-10-CM | POA: Diagnosis not present

## 2017-10-08 DIAGNOSIS — E038 Other specified hypothyroidism: Secondary | ICD-10-CM | POA: Diagnosis not present

## 2017-10-08 DIAGNOSIS — E1151 Type 2 diabetes mellitus with diabetic peripheral angiopathy without gangrene: Secondary | ICD-10-CM | POA: Diagnosis not present

## 2017-10-08 DIAGNOSIS — E1165 Type 2 diabetes mellitus with hyperglycemia: Secondary | ICD-10-CM | POA: Diagnosis not present

## 2017-10-08 DIAGNOSIS — A0682 Other amebic genitourinary infections: Secondary | ICD-10-CM | POA: Diagnosis not present

## 2017-10-10 DIAGNOSIS — M25511 Pain in right shoulder: Secondary | ICD-10-CM | POA: Diagnosis not present

## 2017-10-10 DIAGNOSIS — I69954 Hemiplegia and hemiparesis following unspecified cerebrovascular disease affecting left non-dominant side: Secondary | ICD-10-CM | POA: Diagnosis not present

## 2017-10-10 DIAGNOSIS — R278 Other lack of coordination: Secondary | ICD-10-CM | POA: Diagnosis not present

## 2017-10-15 DIAGNOSIS — M25511 Pain in right shoulder: Secondary | ICD-10-CM | POA: Diagnosis not present

## 2017-10-15 DIAGNOSIS — I69954 Hemiplegia and hemiparesis following unspecified cerebrovascular disease affecting left non-dominant side: Secondary | ICD-10-CM | POA: Diagnosis not present

## 2017-10-15 DIAGNOSIS — E119 Type 2 diabetes mellitus without complications: Secondary | ICD-10-CM | POA: Diagnosis not present

## 2017-10-15 DIAGNOSIS — R278 Other lack of coordination: Secondary | ICD-10-CM | POA: Diagnosis not present

## 2017-10-16 DIAGNOSIS — R278 Other lack of coordination: Secondary | ICD-10-CM | POA: Diagnosis not present

## 2017-10-16 DIAGNOSIS — M25511 Pain in right shoulder: Secondary | ICD-10-CM | POA: Diagnosis not present

## 2017-10-16 DIAGNOSIS — I69954 Hemiplegia and hemiparesis following unspecified cerebrovascular disease affecting left non-dominant side: Secondary | ICD-10-CM | POA: Diagnosis not present

## 2017-10-17 DIAGNOSIS — M25511 Pain in right shoulder: Secondary | ICD-10-CM | POA: Diagnosis not present

## 2017-10-17 DIAGNOSIS — R278 Other lack of coordination: Secondary | ICD-10-CM | POA: Diagnosis not present

## 2017-10-17 DIAGNOSIS — I69954 Hemiplegia and hemiparesis following unspecified cerebrovascular disease affecting left non-dominant side: Secondary | ICD-10-CM | POA: Diagnosis not present

## 2017-10-18 DIAGNOSIS — M25511 Pain in right shoulder: Secondary | ICD-10-CM | POA: Diagnosis not present

## 2017-10-18 DIAGNOSIS — R278 Other lack of coordination: Secondary | ICD-10-CM | POA: Diagnosis not present

## 2017-10-18 DIAGNOSIS — I69954 Hemiplegia and hemiparesis following unspecified cerebrovascular disease affecting left non-dominant side: Secondary | ICD-10-CM | POA: Diagnosis not present

## 2017-10-19 DIAGNOSIS — M25511 Pain in right shoulder: Secondary | ICD-10-CM | POA: Diagnosis not present

## 2017-10-19 DIAGNOSIS — I69954 Hemiplegia and hemiparesis following unspecified cerebrovascular disease affecting left non-dominant side: Secondary | ICD-10-CM | POA: Diagnosis not present

## 2017-10-19 DIAGNOSIS — R278 Other lack of coordination: Secondary | ICD-10-CM | POA: Diagnosis not present

## 2017-10-22 ENCOUNTER — Telehealth: Payer: Self-pay | Admitting: *Deleted

## 2017-10-22 NOTE — Telephone Encounter (Signed)
Agree with deferring this decision to PCP

## 2017-10-22 NOTE — Telephone Encounter (Signed)
-----   Message from Larina Bras, [place] sent at 12/15/2014  2:24 PM EDT ----- Pt needs repeat cologuard testing around 11/08/17... See cologuard results from 11/08/14

## 2017-10-22 NOTE — Telephone Encounter (Signed)
Dr Hilarie Fredrickson, see 12/15/14 telephone note. Do you think patient still appropriate for repeat cologuard or defer to PCP?

## 2017-10-22 NOTE — Telephone Encounter (Signed)
Letter mailed to patient and note routed to pcp on file.

## 2017-10-24 DIAGNOSIS — I69954 Hemiplegia and hemiparesis following unspecified cerebrovascular disease affecting left non-dominant side: Secondary | ICD-10-CM | POA: Diagnosis not present

## 2017-10-24 DIAGNOSIS — M25511 Pain in right shoulder: Secondary | ICD-10-CM | POA: Diagnosis not present

## 2017-10-24 DIAGNOSIS — R278 Other lack of coordination: Secondary | ICD-10-CM | POA: Diagnosis not present

## 2017-10-26 DIAGNOSIS — M25511 Pain in right shoulder: Secondary | ICD-10-CM | POA: Diagnosis not present

## 2017-10-26 DIAGNOSIS — R278 Other lack of coordination: Secondary | ICD-10-CM | POA: Diagnosis not present

## 2017-10-26 DIAGNOSIS — I69954 Hemiplegia and hemiparesis following unspecified cerebrovascular disease affecting left non-dominant side: Secondary | ICD-10-CM | POA: Diagnosis not present

## 2017-10-29 DIAGNOSIS — R278 Other lack of coordination: Secondary | ICD-10-CM | POA: Diagnosis not present

## 2017-10-29 DIAGNOSIS — I69954 Hemiplegia and hemiparesis following unspecified cerebrovascular disease affecting left non-dominant side: Secondary | ICD-10-CM | POA: Diagnosis not present

## 2017-10-29 DIAGNOSIS — M25511 Pain in right shoulder: Secondary | ICD-10-CM | POA: Diagnosis not present

## 2017-10-30 DIAGNOSIS — L400 Psoriasis vulgaris: Secondary | ICD-10-CM | POA: Diagnosis not present

## 2017-10-31 DIAGNOSIS — M25511 Pain in right shoulder: Secondary | ICD-10-CM | POA: Diagnosis not present

## 2017-10-31 DIAGNOSIS — R278 Other lack of coordination: Secondary | ICD-10-CM | POA: Diagnosis not present

## 2017-10-31 DIAGNOSIS — I69954 Hemiplegia and hemiparesis following unspecified cerebrovascular disease affecting left non-dominant side: Secondary | ICD-10-CM | POA: Diagnosis not present

## 2017-11-01 DIAGNOSIS — E119 Type 2 diabetes mellitus without complications: Secondary | ICD-10-CM | POA: Diagnosis not present

## 2017-11-02 DIAGNOSIS — I69954 Hemiplegia and hemiparesis following unspecified cerebrovascular disease affecting left non-dominant side: Secondary | ICD-10-CM | POA: Diagnosis not present

## 2017-11-02 DIAGNOSIS — M25511 Pain in right shoulder: Secondary | ICD-10-CM | POA: Diagnosis not present

## 2017-11-02 DIAGNOSIS — R278 Other lack of coordination: Secondary | ICD-10-CM | POA: Diagnosis not present

## 2017-11-06 DIAGNOSIS — M25511 Pain in right shoulder: Secondary | ICD-10-CM | POA: Diagnosis not present

## 2017-11-06 DIAGNOSIS — I69954 Hemiplegia and hemiparesis following unspecified cerebrovascular disease affecting left non-dominant side: Secondary | ICD-10-CM | POA: Diagnosis not present

## 2017-11-06 DIAGNOSIS — R278 Other lack of coordination: Secondary | ICD-10-CM | POA: Diagnosis not present

## 2017-11-08 DIAGNOSIS — M25511 Pain in right shoulder: Secondary | ICD-10-CM | POA: Diagnosis not present

## 2017-11-08 DIAGNOSIS — I69954 Hemiplegia and hemiparesis following unspecified cerebrovascular disease affecting left non-dominant side: Secondary | ICD-10-CM | POA: Diagnosis not present

## 2017-11-08 DIAGNOSIS — R278 Other lack of coordination: Secondary | ICD-10-CM | POA: Diagnosis not present

## 2017-11-09 DIAGNOSIS — M25511 Pain in right shoulder: Secondary | ICD-10-CM | POA: Diagnosis not present

## 2017-11-09 DIAGNOSIS — I69954 Hemiplegia and hemiparesis following unspecified cerebrovascular disease affecting left non-dominant side: Secondary | ICD-10-CM | POA: Diagnosis not present

## 2017-11-09 DIAGNOSIS — R278 Other lack of coordination: Secondary | ICD-10-CM | POA: Diagnosis not present

## 2017-11-12 DIAGNOSIS — R278 Other lack of coordination: Secondary | ICD-10-CM | POA: Diagnosis not present

## 2017-11-12 DIAGNOSIS — M25511 Pain in right shoulder: Secondary | ICD-10-CM | POA: Diagnosis not present

## 2017-11-12 DIAGNOSIS — I69954 Hemiplegia and hemiparesis following unspecified cerebrovascular disease affecting left non-dominant side: Secondary | ICD-10-CM | POA: Diagnosis not present

## 2017-11-13 DIAGNOSIS — M25511 Pain in right shoulder: Secondary | ICD-10-CM | POA: Diagnosis not present

## 2017-11-13 DIAGNOSIS — R278 Other lack of coordination: Secondary | ICD-10-CM | POA: Diagnosis not present

## 2017-11-13 DIAGNOSIS — I69954 Hemiplegia and hemiparesis following unspecified cerebrovascular disease affecting left non-dominant side: Secondary | ICD-10-CM | POA: Diagnosis not present

## 2017-11-14 DIAGNOSIS — R278 Other lack of coordination: Secondary | ICD-10-CM | POA: Diagnosis not present

## 2017-11-14 DIAGNOSIS — M25511 Pain in right shoulder: Secondary | ICD-10-CM | POA: Diagnosis not present

## 2017-11-14 DIAGNOSIS — I69954 Hemiplegia and hemiparesis following unspecified cerebrovascular disease affecting left non-dominant side: Secondary | ICD-10-CM | POA: Diagnosis not present

## 2017-11-15 DIAGNOSIS — M25511 Pain in right shoulder: Secondary | ICD-10-CM | POA: Diagnosis not present

## 2017-11-15 DIAGNOSIS — I69954 Hemiplegia and hemiparesis following unspecified cerebrovascular disease affecting left non-dominant side: Secondary | ICD-10-CM | POA: Diagnosis not present

## 2017-11-15 DIAGNOSIS — R278 Other lack of coordination: Secondary | ICD-10-CM | POA: Diagnosis not present

## 2017-11-16 DIAGNOSIS — I69954 Hemiplegia and hemiparesis following unspecified cerebrovascular disease affecting left non-dominant side: Secondary | ICD-10-CM | POA: Diagnosis not present

## 2017-11-16 DIAGNOSIS — R278 Other lack of coordination: Secondary | ICD-10-CM | POA: Diagnosis not present

## 2017-11-16 DIAGNOSIS — M25511 Pain in right shoulder: Secondary | ICD-10-CM | POA: Diagnosis not present

## 2017-11-19 DIAGNOSIS — R278 Other lack of coordination: Secondary | ICD-10-CM | POA: Diagnosis not present

## 2017-11-19 DIAGNOSIS — I69954 Hemiplegia and hemiparesis following unspecified cerebrovascular disease affecting left non-dominant side: Secondary | ICD-10-CM | POA: Diagnosis not present

## 2017-11-19 DIAGNOSIS — M25511 Pain in right shoulder: Secondary | ICD-10-CM | POA: Diagnosis not present

## 2017-11-20 DIAGNOSIS — R278 Other lack of coordination: Secondary | ICD-10-CM | POA: Diagnosis not present

## 2017-11-20 DIAGNOSIS — I69954 Hemiplegia and hemiparesis following unspecified cerebrovascular disease affecting left non-dominant side: Secondary | ICD-10-CM | POA: Diagnosis not present

## 2017-11-20 DIAGNOSIS — M25511 Pain in right shoulder: Secondary | ICD-10-CM | POA: Diagnosis not present

## 2017-11-21 DIAGNOSIS — M25511 Pain in right shoulder: Secondary | ICD-10-CM | POA: Diagnosis not present

## 2017-11-21 DIAGNOSIS — Z79899 Other long term (current) drug therapy: Secondary | ICD-10-CM | POA: Diagnosis not present

## 2017-11-21 DIAGNOSIS — I10 Essential (primary) hypertension: Secondary | ICD-10-CM | POA: Diagnosis not present

## 2017-11-21 DIAGNOSIS — E1151 Type 2 diabetes mellitus with diabetic peripheral angiopathy without gangrene: Secondary | ICD-10-CM | POA: Diagnosis not present

## 2017-11-21 DIAGNOSIS — I69954 Hemiplegia and hemiparesis following unspecified cerebrovascular disease affecting left non-dominant side: Secondary | ICD-10-CM | POA: Diagnosis not present

## 2017-11-21 DIAGNOSIS — R278 Other lack of coordination: Secondary | ICD-10-CM | POA: Diagnosis not present

## 2017-11-22 DIAGNOSIS — I69954 Hemiplegia and hemiparesis following unspecified cerebrovascular disease affecting left non-dominant side: Secondary | ICD-10-CM | POA: Diagnosis not present

## 2017-11-22 DIAGNOSIS — M25511 Pain in right shoulder: Secondary | ICD-10-CM | POA: Diagnosis not present

## 2017-11-22 DIAGNOSIS — R278 Other lack of coordination: Secondary | ICD-10-CM | POA: Diagnosis not present

## 2017-11-23 DIAGNOSIS — R278 Other lack of coordination: Secondary | ICD-10-CM | POA: Diagnosis not present

## 2017-11-23 DIAGNOSIS — I69954 Hemiplegia and hemiparesis following unspecified cerebrovascular disease affecting left non-dominant side: Secondary | ICD-10-CM | POA: Diagnosis not present

## 2017-11-23 DIAGNOSIS — M25511 Pain in right shoulder: Secondary | ICD-10-CM | POA: Diagnosis not present

## 2017-11-26 DIAGNOSIS — M25511 Pain in right shoulder: Secondary | ICD-10-CM | POA: Diagnosis not present

## 2017-11-26 DIAGNOSIS — I69954 Hemiplegia and hemiparesis following unspecified cerebrovascular disease affecting left non-dominant side: Secondary | ICD-10-CM | POA: Diagnosis not present

## 2017-11-26 DIAGNOSIS — R278 Other lack of coordination: Secondary | ICD-10-CM | POA: Diagnosis not present

## 2017-11-27 DIAGNOSIS — I69954 Hemiplegia and hemiparesis following unspecified cerebrovascular disease affecting left non-dominant side: Secondary | ICD-10-CM | POA: Diagnosis not present

## 2017-11-27 DIAGNOSIS — R278 Other lack of coordination: Secondary | ICD-10-CM | POA: Diagnosis not present

## 2017-11-27 DIAGNOSIS — M25511 Pain in right shoulder: Secondary | ICD-10-CM | POA: Diagnosis not present

## 2017-11-28 DIAGNOSIS — I69954 Hemiplegia and hemiparesis following unspecified cerebrovascular disease affecting left non-dominant side: Secondary | ICD-10-CM | POA: Diagnosis not present

## 2017-11-28 DIAGNOSIS — M25511 Pain in right shoulder: Secondary | ICD-10-CM | POA: Diagnosis not present

## 2017-11-28 DIAGNOSIS — R278 Other lack of coordination: Secondary | ICD-10-CM | POA: Diagnosis not present

## 2017-11-29 DIAGNOSIS — M25511 Pain in right shoulder: Secondary | ICD-10-CM | POA: Diagnosis not present

## 2017-11-29 DIAGNOSIS — I69954 Hemiplegia and hemiparesis following unspecified cerebrovascular disease affecting left non-dominant side: Secondary | ICD-10-CM | POA: Diagnosis not present

## 2017-11-29 DIAGNOSIS — R278 Other lack of coordination: Secondary | ICD-10-CM | POA: Diagnosis not present

## 2017-11-30 DIAGNOSIS — I69954 Hemiplegia and hemiparesis following unspecified cerebrovascular disease affecting left non-dominant side: Secondary | ICD-10-CM | POA: Diagnosis not present

## 2017-11-30 DIAGNOSIS — R278 Other lack of coordination: Secondary | ICD-10-CM | POA: Diagnosis not present

## 2017-11-30 DIAGNOSIS — M25511 Pain in right shoulder: Secondary | ICD-10-CM | POA: Diagnosis not present

## 2017-12-03 DIAGNOSIS — R278 Other lack of coordination: Secondary | ICD-10-CM | POA: Diagnosis not present

## 2017-12-03 DIAGNOSIS — I69954 Hemiplegia and hemiparesis following unspecified cerebrovascular disease affecting left non-dominant side: Secondary | ICD-10-CM | POA: Diagnosis not present

## 2017-12-03 DIAGNOSIS — M25511 Pain in right shoulder: Secondary | ICD-10-CM | POA: Diagnosis not present

## 2017-12-04 DIAGNOSIS — R278 Other lack of coordination: Secondary | ICD-10-CM | POA: Diagnosis not present

## 2017-12-04 DIAGNOSIS — M25511 Pain in right shoulder: Secondary | ICD-10-CM | POA: Diagnosis not present

## 2017-12-04 DIAGNOSIS — I69954 Hemiplegia and hemiparesis following unspecified cerebrovascular disease affecting left non-dominant side: Secondary | ICD-10-CM | POA: Diagnosis not present

## 2017-12-05 DIAGNOSIS — R278 Other lack of coordination: Secondary | ICD-10-CM | POA: Diagnosis not present

## 2017-12-05 DIAGNOSIS — M25511 Pain in right shoulder: Secondary | ICD-10-CM | POA: Diagnosis not present

## 2017-12-05 DIAGNOSIS — I69954 Hemiplegia and hemiparesis following unspecified cerebrovascular disease affecting left non-dominant side: Secondary | ICD-10-CM | POA: Diagnosis not present

## 2017-12-06 DIAGNOSIS — R278 Other lack of coordination: Secondary | ICD-10-CM | POA: Diagnosis not present

## 2017-12-06 DIAGNOSIS — I69954 Hemiplegia and hemiparesis following unspecified cerebrovascular disease affecting left non-dominant side: Secondary | ICD-10-CM | POA: Diagnosis not present

## 2017-12-06 DIAGNOSIS — M25511 Pain in right shoulder: Secondary | ICD-10-CM | POA: Diagnosis not present

## 2017-12-07 DIAGNOSIS — R278 Other lack of coordination: Secondary | ICD-10-CM | POA: Diagnosis not present

## 2017-12-07 DIAGNOSIS — M25511 Pain in right shoulder: Secondary | ICD-10-CM | POA: Diagnosis not present

## 2017-12-07 DIAGNOSIS — I69954 Hemiplegia and hemiparesis following unspecified cerebrovascular disease affecting left non-dominant side: Secondary | ICD-10-CM | POA: Diagnosis not present

## 2017-12-10 DIAGNOSIS — M25511 Pain in right shoulder: Secondary | ICD-10-CM | POA: Diagnosis not present

## 2017-12-10 DIAGNOSIS — I69954 Hemiplegia and hemiparesis following unspecified cerebrovascular disease affecting left non-dominant side: Secondary | ICD-10-CM | POA: Diagnosis not present

## 2017-12-10 DIAGNOSIS — R278 Other lack of coordination: Secondary | ICD-10-CM | POA: Diagnosis not present

## 2017-12-11 DIAGNOSIS — I69954 Hemiplegia and hemiparesis following unspecified cerebrovascular disease affecting left non-dominant side: Secondary | ICD-10-CM | POA: Diagnosis not present

## 2017-12-11 DIAGNOSIS — R278 Other lack of coordination: Secondary | ICD-10-CM | POA: Diagnosis not present

## 2017-12-11 DIAGNOSIS — M25511 Pain in right shoulder: Secondary | ICD-10-CM | POA: Diagnosis not present

## 2017-12-12 DIAGNOSIS — I69954 Hemiplegia and hemiparesis following unspecified cerebrovascular disease affecting left non-dominant side: Secondary | ICD-10-CM | POA: Diagnosis not present

## 2017-12-12 DIAGNOSIS — R278 Other lack of coordination: Secondary | ICD-10-CM | POA: Diagnosis not present

## 2017-12-12 DIAGNOSIS — E1165 Type 2 diabetes mellitus with hyperglycemia: Secondary | ICD-10-CM | POA: Diagnosis not present

## 2017-12-12 DIAGNOSIS — M25511 Pain in right shoulder: Secondary | ICD-10-CM | POA: Diagnosis not present

## 2017-12-13 DIAGNOSIS — R278 Other lack of coordination: Secondary | ICD-10-CM | POA: Diagnosis not present

## 2017-12-13 DIAGNOSIS — I69954 Hemiplegia and hemiparesis following unspecified cerebrovascular disease affecting left non-dominant side: Secondary | ICD-10-CM | POA: Diagnosis not present

## 2017-12-13 DIAGNOSIS — M25511 Pain in right shoulder: Secondary | ICD-10-CM | POA: Diagnosis not present

## 2017-12-14 DIAGNOSIS — R278 Other lack of coordination: Secondary | ICD-10-CM | POA: Diagnosis not present

## 2017-12-14 DIAGNOSIS — M25511 Pain in right shoulder: Secondary | ICD-10-CM | POA: Diagnosis not present

## 2017-12-14 DIAGNOSIS — I69954 Hemiplegia and hemiparesis following unspecified cerebrovascular disease affecting left non-dominant side: Secondary | ICD-10-CM | POA: Diagnosis not present

## 2017-12-30 DIAGNOSIS — F410 Panic disorder [episodic paroxysmal anxiety] without agoraphobia: Secondary | ICD-10-CM | POA: Diagnosis not present

## 2017-12-30 DIAGNOSIS — G4700 Insomnia, unspecified: Secondary | ICD-10-CM | POA: Diagnosis not present

## 2017-12-30 DIAGNOSIS — F329 Major depressive disorder, single episode, unspecified: Secondary | ICD-10-CM | POA: Diagnosis not present

## 2018-01-04 DIAGNOSIS — E1165 Type 2 diabetes mellitus with hyperglycemia: Secondary | ICD-10-CM | POA: Diagnosis not present

## 2018-01-04 DIAGNOSIS — H04123 Dry eye syndrome of bilateral lacrimal glands: Secondary | ICD-10-CM | POA: Diagnosis not present

## 2018-01-04 DIAGNOSIS — Z9842 Cataract extraction status, left eye: Secondary | ICD-10-CM | POA: Diagnosis not present

## 2018-01-04 DIAGNOSIS — Z9841 Cataract extraction status, right eye: Secondary | ICD-10-CM | POA: Diagnosis not present

## 2018-01-04 DIAGNOSIS — E1149 Type 2 diabetes mellitus with other diabetic neurological complication: Secondary | ICD-10-CM | POA: Diagnosis not present

## 2018-01-04 DIAGNOSIS — Z961 Presence of intraocular lens: Secondary | ICD-10-CM | POA: Diagnosis not present

## 2018-01-17 DIAGNOSIS — E1051 Type 1 diabetes mellitus with diabetic peripheral angiopathy without gangrene: Secondary | ICD-10-CM | POA: Diagnosis not present

## 2018-01-17 DIAGNOSIS — L03032 Cellulitis of left toe: Secondary | ICD-10-CM | POA: Diagnosis not present

## 2018-01-17 DIAGNOSIS — L603 Nail dystrophy: Secondary | ICD-10-CM | POA: Diagnosis not present

## 2018-01-17 DIAGNOSIS — I739 Peripheral vascular disease, unspecified: Secondary | ICD-10-CM | POA: Diagnosis not present

## 2018-02-01 DIAGNOSIS — Z79899 Other long term (current) drug therapy: Secondary | ICD-10-CM | POA: Diagnosis not present

## 2018-02-14 DIAGNOSIS — E119 Type 2 diabetes mellitus without complications: Secondary | ICD-10-CM | POA: Diagnosis not present

## 2018-02-14 DIAGNOSIS — M542 Cervicalgia: Secondary | ICD-10-CM | POA: Diagnosis not present

## 2018-02-14 DIAGNOSIS — J449 Chronic obstructive pulmonary disease, unspecified: Secondary | ICD-10-CM | POA: Diagnosis not present

## 2018-02-14 DIAGNOSIS — E039 Hypothyroidism, unspecified: Secondary | ICD-10-CM | POA: Diagnosis not present

## 2018-03-04 DIAGNOSIS — E119 Type 2 diabetes mellitus without complications: Secondary | ICD-10-CM | POA: Diagnosis not present

## 2018-03-04 DIAGNOSIS — M3501 Sicca syndrome with keratoconjunctivitis: Secondary | ICD-10-CM | POA: Diagnosis not present

## 2018-03-04 DIAGNOSIS — Z961 Presence of intraocular lens: Secondary | ICD-10-CM | POA: Diagnosis not present

## 2018-03-04 DIAGNOSIS — H04123 Dry eye syndrome of bilateral lacrimal glands: Secondary | ICD-10-CM | POA: Diagnosis not present

## 2018-03-07 DIAGNOSIS — G894 Chronic pain syndrome: Secondary | ICD-10-CM | POA: Diagnosis not present

## 2018-03-08 DIAGNOSIS — Z79899 Other long term (current) drug therapy: Secondary | ICD-10-CM | POA: Diagnosis not present

## 2018-03-11 DIAGNOSIS — I69954 Hemiplegia and hemiparesis following unspecified cerebrovascular disease affecting left non-dominant side: Secondary | ICD-10-CM | POA: Diagnosis not present

## 2018-03-11 DIAGNOSIS — M6281 Muscle weakness (generalized): Secondary | ICD-10-CM | POA: Diagnosis not present

## 2018-03-11 DIAGNOSIS — R278 Other lack of coordination: Secondary | ICD-10-CM | POA: Diagnosis not present

## 2018-03-12 DIAGNOSIS — R278 Other lack of coordination: Secondary | ICD-10-CM | POA: Diagnosis not present

## 2018-03-12 DIAGNOSIS — I69954 Hemiplegia and hemiparesis following unspecified cerebrovascular disease affecting left non-dominant side: Secondary | ICD-10-CM | POA: Diagnosis not present

## 2018-03-12 DIAGNOSIS — M6281 Muscle weakness (generalized): Secondary | ICD-10-CM | POA: Diagnosis not present

## 2018-03-13 DIAGNOSIS — I69954 Hemiplegia and hemiparesis following unspecified cerebrovascular disease affecting left non-dominant side: Secondary | ICD-10-CM | POA: Diagnosis not present

## 2018-03-13 DIAGNOSIS — M6281 Muscle weakness (generalized): Secondary | ICD-10-CM | POA: Diagnosis not present

## 2018-03-13 DIAGNOSIS — R278 Other lack of coordination: Secondary | ICD-10-CM | POA: Diagnosis not present

## 2018-03-14 DIAGNOSIS — G4700 Insomnia, unspecified: Secondary | ICD-10-CM | POA: Diagnosis not present

## 2018-03-14 DIAGNOSIS — F329 Major depressive disorder, single episode, unspecified: Secondary | ICD-10-CM | POA: Diagnosis not present

## 2018-03-14 DIAGNOSIS — R278 Other lack of coordination: Secondary | ICD-10-CM | POA: Diagnosis not present

## 2018-03-14 DIAGNOSIS — F3181 Bipolar II disorder: Secondary | ICD-10-CM | POA: Diagnosis not present

## 2018-03-14 DIAGNOSIS — F064 Anxiety disorder due to known physiological condition: Secondary | ICD-10-CM | POA: Diagnosis not present

## 2018-03-14 DIAGNOSIS — I69954 Hemiplegia and hemiparesis following unspecified cerebrovascular disease affecting left non-dominant side: Secondary | ICD-10-CM | POA: Diagnosis not present

## 2018-03-14 DIAGNOSIS — M6281 Muscle weakness (generalized): Secondary | ICD-10-CM | POA: Diagnosis not present

## 2018-03-15 DIAGNOSIS — R278 Other lack of coordination: Secondary | ICD-10-CM | POA: Diagnosis not present

## 2018-03-15 DIAGNOSIS — I2510 Atherosclerotic heart disease of native coronary artery without angina pectoris: Secondary | ICD-10-CM | POA: Diagnosis not present

## 2018-03-15 DIAGNOSIS — I69954 Hemiplegia and hemiparesis following unspecified cerebrovascular disease affecting left non-dominant side: Secondary | ICD-10-CM | POA: Diagnosis not present

## 2018-03-15 DIAGNOSIS — E1140 Type 2 diabetes mellitus with diabetic neuropathy, unspecified: Secondary | ICD-10-CM | POA: Diagnosis not present

## 2018-03-15 DIAGNOSIS — M6281 Muscle weakness (generalized): Secondary | ICD-10-CM | POA: Diagnosis not present

## 2018-03-18 DIAGNOSIS — I69954 Hemiplegia and hemiparesis following unspecified cerebrovascular disease affecting left non-dominant side: Secondary | ICD-10-CM | POA: Diagnosis not present

## 2018-03-18 DIAGNOSIS — E119 Type 2 diabetes mellitus without complications: Secondary | ICD-10-CM | POA: Diagnosis not present

## 2018-03-18 DIAGNOSIS — M6281 Muscle weakness (generalized): Secondary | ICD-10-CM | POA: Diagnosis not present

## 2018-03-18 DIAGNOSIS — L299 Pruritus, unspecified: Secondary | ICD-10-CM | POA: Diagnosis not present

## 2018-03-18 DIAGNOSIS — L400 Psoriasis vulgaris: Secondary | ICD-10-CM | POA: Diagnosis not present

## 2018-03-18 DIAGNOSIS — E876 Hypokalemia: Secondary | ICD-10-CM | POA: Diagnosis not present

## 2018-03-18 DIAGNOSIS — R278 Other lack of coordination: Secondary | ICD-10-CM | POA: Diagnosis not present

## 2018-03-19 DIAGNOSIS — M6281 Muscle weakness (generalized): Secondary | ICD-10-CM | POA: Diagnosis not present

## 2018-03-19 DIAGNOSIS — R278 Other lack of coordination: Secondary | ICD-10-CM | POA: Diagnosis not present

## 2018-03-19 DIAGNOSIS — I69954 Hemiplegia and hemiparesis following unspecified cerebrovascular disease affecting left non-dominant side: Secondary | ICD-10-CM | POA: Diagnosis not present

## 2018-03-20 DIAGNOSIS — R278 Other lack of coordination: Secondary | ICD-10-CM | POA: Diagnosis not present

## 2018-03-20 DIAGNOSIS — M6281 Muscle weakness (generalized): Secondary | ICD-10-CM | POA: Diagnosis not present

## 2018-03-20 DIAGNOSIS — I69954 Hemiplegia and hemiparesis following unspecified cerebrovascular disease affecting left non-dominant side: Secondary | ICD-10-CM | POA: Diagnosis not present

## 2018-03-21 DIAGNOSIS — M6281 Muscle weakness (generalized): Secondary | ICD-10-CM | POA: Diagnosis not present

## 2018-03-21 DIAGNOSIS — I69954 Hemiplegia and hemiparesis following unspecified cerebrovascular disease affecting left non-dominant side: Secondary | ICD-10-CM | POA: Diagnosis not present

## 2018-03-21 DIAGNOSIS — R278 Other lack of coordination: Secondary | ICD-10-CM | POA: Diagnosis not present

## 2018-03-22 DIAGNOSIS — M6281 Muscle weakness (generalized): Secondary | ICD-10-CM | POA: Diagnosis not present

## 2018-03-22 DIAGNOSIS — E559 Vitamin D deficiency, unspecified: Secondary | ICD-10-CM | POA: Diagnosis not present

## 2018-03-22 DIAGNOSIS — R278 Other lack of coordination: Secondary | ICD-10-CM | POA: Diagnosis not present

## 2018-03-22 DIAGNOSIS — Z79899 Other long term (current) drug therapy: Secondary | ICD-10-CM | POA: Diagnosis not present

## 2018-03-22 DIAGNOSIS — I10 Essential (primary) hypertension: Secondary | ICD-10-CM | POA: Diagnosis not present

## 2018-03-22 DIAGNOSIS — I69954 Hemiplegia and hemiparesis following unspecified cerebrovascular disease affecting left non-dominant side: Secondary | ICD-10-CM | POA: Diagnosis not present

## 2018-03-25 DIAGNOSIS — I69954 Hemiplegia and hemiparesis following unspecified cerebrovascular disease affecting left non-dominant side: Secondary | ICD-10-CM | POA: Diagnosis not present

## 2018-03-25 DIAGNOSIS — M6281 Muscle weakness (generalized): Secondary | ICD-10-CM | POA: Diagnosis not present

## 2018-03-25 DIAGNOSIS — R278 Other lack of coordination: Secondary | ICD-10-CM | POA: Diagnosis not present

## 2018-03-26 DIAGNOSIS — G479 Sleep disorder, unspecified: Secondary | ICD-10-CM | POA: Diagnosis not present

## 2018-03-26 DIAGNOSIS — I69954 Hemiplegia and hemiparesis following unspecified cerebrovascular disease affecting left non-dominant side: Secondary | ICD-10-CM | POA: Diagnosis not present

## 2018-03-26 DIAGNOSIS — R278 Other lack of coordination: Secondary | ICD-10-CM | POA: Diagnosis not present

## 2018-03-26 DIAGNOSIS — E559 Vitamin D deficiency, unspecified: Secondary | ICD-10-CM | POA: Diagnosis not present

## 2018-03-26 DIAGNOSIS — M6281 Muscle weakness (generalized): Secondary | ICD-10-CM | POA: Diagnosis not present

## 2018-03-28 DIAGNOSIS — E876 Hypokalemia: Secondary | ICD-10-CM | POA: Diagnosis not present

## 2018-03-28 DIAGNOSIS — E559 Vitamin D deficiency, unspecified: Secondary | ICD-10-CM | POA: Diagnosis not present

## 2018-03-28 DIAGNOSIS — L400 Psoriasis vulgaris: Secondary | ICD-10-CM | POA: Diagnosis not present

## 2018-03-28 DIAGNOSIS — E119 Type 2 diabetes mellitus without complications: Secondary | ICD-10-CM | POA: Diagnosis not present

## 2018-04-08 DIAGNOSIS — A0682 Other amebic genitourinary infections: Secondary | ICD-10-CM | POA: Diagnosis not present

## 2018-04-08 DIAGNOSIS — E039 Hypothyroidism, unspecified: Secondary | ICD-10-CM | POA: Diagnosis not present

## 2018-04-08 DIAGNOSIS — N390 Urinary tract infection, site not specified: Secondary | ICD-10-CM | POA: Diagnosis not present

## 2018-04-08 DIAGNOSIS — E1151 Type 2 diabetes mellitus with diabetic peripheral angiopathy without gangrene: Secondary | ICD-10-CM | POA: Diagnosis not present

## 2018-04-08 DIAGNOSIS — E1165 Type 2 diabetes mellitus with hyperglycemia: Secondary | ICD-10-CM | POA: Diagnosis not present

## 2018-04-08 DIAGNOSIS — E038 Other specified hypothyroidism: Secondary | ICD-10-CM | POA: Diagnosis not present

## 2018-04-08 DIAGNOSIS — I10 Essential (primary) hypertension: Secondary | ICD-10-CM | POA: Diagnosis not present

## 2018-04-09 DIAGNOSIS — G4700 Insomnia, unspecified: Secondary | ICD-10-CM | POA: Diagnosis not present

## 2018-04-09 DIAGNOSIS — R300 Dysuria: Secondary | ICD-10-CM | POA: Diagnosis not present

## 2018-04-09 DIAGNOSIS — R509 Fever, unspecified: Secondary | ICD-10-CM | POA: Diagnosis not present

## 2018-04-09 DIAGNOSIS — F3181 Bipolar II disorder: Secondary | ICD-10-CM | POA: Diagnosis not present

## 2018-04-09 DIAGNOSIS — F329 Major depressive disorder, single episode, unspecified: Secondary | ICD-10-CM | POA: Diagnosis not present

## 2018-04-09 DIAGNOSIS — F064 Anxiety disorder due to known physiological condition: Secondary | ICD-10-CM | POA: Diagnosis not present

## 2018-04-10 DIAGNOSIS — E1140 Type 2 diabetes mellitus with diabetic neuropathy, unspecified: Secondary | ICD-10-CM | POA: Diagnosis not present

## 2018-04-10 DIAGNOSIS — N390 Urinary tract infection, site not specified: Secondary | ICD-10-CM | POA: Diagnosis not present

## 2018-04-10 DIAGNOSIS — E559 Vitamin D deficiency, unspecified: Secondary | ICD-10-CM | POA: Diagnosis not present

## 2018-04-25 DIAGNOSIS — F329 Major depressive disorder, single episode, unspecified: Secondary | ICD-10-CM | POA: Diagnosis not present

## 2018-04-25 DIAGNOSIS — F064 Anxiety disorder due to known physiological condition: Secondary | ICD-10-CM | POA: Diagnosis not present

## 2018-04-25 DIAGNOSIS — G4700 Insomnia, unspecified: Secondary | ICD-10-CM | POA: Diagnosis not present

## 2018-04-25 DIAGNOSIS — F3181 Bipolar II disorder: Secondary | ICD-10-CM | POA: Diagnosis not present

## 2018-05-16 DIAGNOSIS — L400 Psoriasis vulgaris: Secondary | ICD-10-CM | POA: Diagnosis not present

## 2018-05-16 DIAGNOSIS — L299 Pruritus, unspecified: Secondary | ICD-10-CM | POA: Diagnosis not present

## 2018-05-27 DIAGNOSIS — I739 Peripheral vascular disease, unspecified: Secondary | ICD-10-CM | POA: Diagnosis not present

## 2018-05-27 DIAGNOSIS — B351 Tinea unguium: Secondary | ICD-10-CM | POA: Diagnosis not present

## 2018-05-30 DIAGNOSIS — F3181 Bipolar II disorder: Secondary | ICD-10-CM | POA: Diagnosis not present

## 2018-05-30 DIAGNOSIS — G479 Sleep disorder, unspecified: Secondary | ICD-10-CM | POA: Diagnosis not present

## 2018-05-30 DIAGNOSIS — F064 Anxiety disorder due to known physiological condition: Secondary | ICD-10-CM | POA: Diagnosis not present

## 2018-05-30 DIAGNOSIS — F329 Major depressive disorder, single episode, unspecified: Secondary | ICD-10-CM | POA: Diagnosis not present

## 2018-06-13 DIAGNOSIS — E1165 Type 2 diabetes mellitus with hyperglycemia: Secondary | ICD-10-CM | POA: Diagnosis not present

## 2018-06-18 DIAGNOSIS — Z794 Long term (current) use of insulin: Secondary | ICD-10-CM | POA: Diagnosis not present

## 2018-06-18 DIAGNOSIS — E039 Hypothyroidism, unspecified: Secondary | ICD-10-CM | POA: Diagnosis not present

## 2018-06-18 DIAGNOSIS — E119 Type 2 diabetes mellitus without complications: Secondary | ICD-10-CM | POA: Diagnosis not present

## 2018-06-18 DIAGNOSIS — E669 Obesity, unspecified: Secondary | ICD-10-CM | POA: Diagnosis not present

## 2018-06-19 DIAGNOSIS — Z79899 Other long term (current) drug therapy: Secondary | ICD-10-CM | POA: Diagnosis not present

## 2018-06-19 DIAGNOSIS — E1140 Type 2 diabetes mellitus with diabetic neuropathy, unspecified: Secondary | ICD-10-CM | POA: Diagnosis not present

## 2018-06-20 DIAGNOSIS — M24542 Contracture, left hand: Secondary | ICD-10-CM | POA: Diagnosis not present

## 2018-06-21 DIAGNOSIS — M24542 Contracture, left hand: Secondary | ICD-10-CM | POA: Diagnosis not present

## 2018-06-22 DIAGNOSIS — Z23 Encounter for immunization: Secondary | ICD-10-CM | POA: Diagnosis not present

## 2018-06-24 DIAGNOSIS — M24542 Contracture, left hand: Secondary | ICD-10-CM | POA: Diagnosis not present

## 2018-06-25 DIAGNOSIS — M24542 Contracture, left hand: Secondary | ICD-10-CM | POA: Diagnosis not present

## 2018-06-26 DIAGNOSIS — F3181 Bipolar II disorder: Secondary | ICD-10-CM | POA: Diagnosis not present

## 2018-06-26 DIAGNOSIS — F064 Anxiety disorder due to known physiological condition: Secondary | ICD-10-CM | POA: Diagnosis not present

## 2018-06-26 DIAGNOSIS — F329 Major depressive disorder, single episode, unspecified: Secondary | ICD-10-CM | POA: Diagnosis not present

## 2018-06-26 DIAGNOSIS — M24542 Contracture, left hand: Secondary | ICD-10-CM | POA: Diagnosis not present

## 2018-06-26 DIAGNOSIS — G4700 Insomnia, unspecified: Secondary | ICD-10-CM | POA: Diagnosis not present

## 2018-06-27 DIAGNOSIS — E559 Vitamin D deficiency, unspecified: Secondary | ICD-10-CM | POA: Diagnosis not present

## 2018-06-27 DIAGNOSIS — M24542 Contracture, left hand: Secondary | ICD-10-CM | POA: Diagnosis not present

## 2018-06-28 DIAGNOSIS — M24542 Contracture, left hand: Secondary | ICD-10-CM | POA: Diagnosis not present

## 2018-07-01 DIAGNOSIS — M24542 Contracture, left hand: Secondary | ICD-10-CM | POA: Diagnosis not present

## 2018-07-02 DIAGNOSIS — R52 Pain, unspecified: Secondary | ICD-10-CM | POA: Diagnosis not present

## 2018-07-02 DIAGNOSIS — Z7409 Other reduced mobility: Secondary | ICD-10-CM | POA: Diagnosis not present

## 2018-07-02 DIAGNOSIS — E559 Vitamin D deficiency, unspecified: Secondary | ICD-10-CM | POA: Diagnosis not present

## 2018-07-02 DIAGNOSIS — M24542 Contracture, left hand: Secondary | ICD-10-CM | POA: Diagnosis not present

## 2018-07-03 DIAGNOSIS — M24542 Contracture, left hand: Secondary | ICD-10-CM | POA: Diagnosis not present

## 2018-07-04 DIAGNOSIS — M24542 Contracture, left hand: Secondary | ICD-10-CM | POA: Diagnosis not present

## 2018-07-05 DIAGNOSIS — M24542 Contracture, left hand: Secondary | ICD-10-CM | POA: Diagnosis not present

## 2018-07-08 DIAGNOSIS — M24542 Contracture, left hand: Secondary | ICD-10-CM | POA: Diagnosis not present

## 2018-07-09 DIAGNOSIS — F329 Major depressive disorder, single episode, unspecified: Secondary | ICD-10-CM | POA: Diagnosis not present

## 2018-07-09 DIAGNOSIS — G4700 Insomnia, unspecified: Secondary | ICD-10-CM | POA: Diagnosis not present

## 2018-07-09 DIAGNOSIS — F064 Anxiety disorder due to known physiological condition: Secondary | ICD-10-CM | POA: Diagnosis not present

## 2018-07-18 DIAGNOSIS — E119 Type 2 diabetes mellitus without complications: Secondary | ICD-10-CM | POA: Diagnosis not present

## 2018-07-18 DIAGNOSIS — Z794 Long term (current) use of insulin: Secondary | ICD-10-CM | POA: Diagnosis not present

## 2018-07-18 DIAGNOSIS — E669 Obesity, unspecified: Secondary | ICD-10-CM | POA: Diagnosis not present

## 2018-07-18 DIAGNOSIS — E11649 Type 2 diabetes mellitus with hypoglycemia without coma: Secondary | ICD-10-CM | POA: Diagnosis not present

## 2018-07-23 DIAGNOSIS — E11649 Type 2 diabetes mellitus with hypoglycemia without coma: Secondary | ICD-10-CM | POA: Diagnosis not present

## 2018-07-23 DIAGNOSIS — Z794 Long term (current) use of insulin: Secondary | ICD-10-CM | POA: Diagnosis not present

## 2018-07-23 DIAGNOSIS — E119 Type 2 diabetes mellitus without complications: Secondary | ICD-10-CM | POA: Diagnosis not present

## 2018-07-23 DIAGNOSIS — E669 Obesity, unspecified: Secondary | ICD-10-CM | POA: Diagnosis not present

## 2018-08-06 DIAGNOSIS — F064 Anxiety disorder due to known physiological condition: Secondary | ICD-10-CM | POA: Diagnosis not present

## 2018-08-06 DIAGNOSIS — G4700 Insomnia, unspecified: Secondary | ICD-10-CM | POA: Diagnosis not present

## 2018-08-06 DIAGNOSIS — F329 Major depressive disorder, single episode, unspecified: Secondary | ICD-10-CM | POA: Diagnosis not present

## 2018-08-06 DIAGNOSIS — F3181 Bipolar II disorder: Secondary | ICD-10-CM | POA: Diagnosis not present

## 2018-08-14 DIAGNOSIS — E119 Type 2 diabetes mellitus without complications: Secondary | ICD-10-CM | POA: Diagnosis not present

## 2018-08-14 DIAGNOSIS — I2510 Atherosclerotic heart disease of native coronary artery without angina pectoris: Secondary | ICD-10-CM | POA: Diagnosis not present

## 2018-08-14 DIAGNOSIS — J449 Chronic obstructive pulmonary disease, unspecified: Secondary | ICD-10-CM | POA: Diagnosis not present

## 2018-08-14 DIAGNOSIS — E039 Hypothyroidism, unspecified: Secondary | ICD-10-CM | POA: Diagnosis not present

## 2018-09-17 DIAGNOSIS — F329 Major depressive disorder, single episode, unspecified: Secondary | ICD-10-CM | POA: Diagnosis not present

## 2018-09-17 DIAGNOSIS — G4700 Insomnia, unspecified: Secondary | ICD-10-CM | POA: Diagnosis not present

## 2018-09-17 DIAGNOSIS — F3181 Bipolar II disorder: Secondary | ICD-10-CM | POA: Diagnosis not present

## 2018-09-17 DIAGNOSIS — I69954 Hemiplegia and hemiparesis following unspecified cerebrovascular disease affecting left non-dominant side: Secondary | ICD-10-CM | POA: Diagnosis not present

## 2018-09-17 DIAGNOSIS — M24542 Contracture, left hand: Secondary | ICD-10-CM | POA: Diagnosis not present

## 2018-09-17 DIAGNOSIS — F410 Panic disorder [episodic paroxysmal anxiety] without agoraphobia: Secondary | ICD-10-CM | POA: Diagnosis not present

## 2018-09-18 DIAGNOSIS — I69954 Hemiplegia and hemiparesis following unspecified cerebrovascular disease affecting left non-dominant side: Secondary | ICD-10-CM | POA: Diagnosis not present

## 2018-09-18 DIAGNOSIS — M24542 Contracture, left hand: Secondary | ICD-10-CM | POA: Diagnosis not present

## 2018-09-19 DIAGNOSIS — I69954 Hemiplegia and hemiparesis following unspecified cerebrovascular disease affecting left non-dominant side: Secondary | ICD-10-CM | POA: Diagnosis not present

## 2018-09-19 DIAGNOSIS — E559 Vitamin D deficiency, unspecified: Secondary | ICD-10-CM | POA: Diagnosis not present

## 2018-09-19 DIAGNOSIS — M24542 Contracture, left hand: Secondary | ICD-10-CM | POA: Diagnosis not present

## 2018-09-20 DIAGNOSIS — I69954 Hemiplegia and hemiparesis following unspecified cerebrovascular disease affecting left non-dominant side: Secondary | ICD-10-CM | POA: Diagnosis not present

## 2018-09-20 DIAGNOSIS — M24542 Contracture, left hand: Secondary | ICD-10-CM | POA: Diagnosis not present

## 2018-09-20 DIAGNOSIS — Z79899 Other long term (current) drug therapy: Secondary | ICD-10-CM | POA: Diagnosis not present

## 2018-09-23 DIAGNOSIS — E039 Hypothyroidism, unspecified: Secondary | ICD-10-CM | POA: Diagnosis not present

## 2018-09-23 DIAGNOSIS — M24542 Contracture, left hand: Secondary | ICD-10-CM | POA: Diagnosis not present

## 2018-09-23 DIAGNOSIS — E559 Vitamin D deficiency, unspecified: Secondary | ICD-10-CM | POA: Diagnosis not present

## 2018-09-23 DIAGNOSIS — I69954 Hemiplegia and hemiparesis following unspecified cerebrovascular disease affecting left non-dominant side: Secondary | ICD-10-CM | POA: Diagnosis not present

## 2018-09-23 DIAGNOSIS — E119 Type 2 diabetes mellitus without complications: Secondary | ICD-10-CM | POA: Diagnosis not present

## 2018-09-23 DIAGNOSIS — I2510 Atherosclerotic heart disease of native coronary artery without angina pectoris: Secondary | ICD-10-CM | POA: Diagnosis not present

## 2018-09-24 DIAGNOSIS — E559 Vitamin D deficiency, unspecified: Secondary | ICD-10-CM | POA: Diagnosis not present

## 2018-09-24 DIAGNOSIS — Z79899 Other long term (current) drug therapy: Secondary | ICD-10-CM | POA: Diagnosis not present

## 2018-09-24 DIAGNOSIS — M24542 Contracture, left hand: Secondary | ICD-10-CM | POA: Diagnosis not present

## 2018-09-24 DIAGNOSIS — I2510 Atherosclerotic heart disease of native coronary artery without angina pectoris: Secondary | ICD-10-CM | POA: Diagnosis not present

## 2018-09-24 DIAGNOSIS — I69954 Hemiplegia and hemiparesis following unspecified cerebrovascular disease affecting left non-dominant side: Secondary | ICD-10-CM | POA: Diagnosis not present

## 2018-09-25 DIAGNOSIS — M24542 Contracture, left hand: Secondary | ICD-10-CM | POA: Diagnosis not present

## 2018-09-25 DIAGNOSIS — I69954 Hemiplegia and hemiparesis following unspecified cerebrovascular disease affecting left non-dominant side: Secondary | ICD-10-CM | POA: Diagnosis not present

## 2018-09-26 DIAGNOSIS — M24542 Contracture, left hand: Secondary | ICD-10-CM | POA: Diagnosis not present

## 2018-09-26 DIAGNOSIS — I69954 Hemiplegia and hemiparesis following unspecified cerebrovascular disease affecting left non-dominant side: Secondary | ICD-10-CM | POA: Diagnosis not present

## 2018-09-27 DIAGNOSIS — I69954 Hemiplegia and hemiparesis following unspecified cerebrovascular disease affecting left non-dominant side: Secondary | ICD-10-CM | POA: Diagnosis not present

## 2018-09-27 DIAGNOSIS — M24542 Contracture, left hand: Secondary | ICD-10-CM | POA: Diagnosis not present

## 2018-09-30 DIAGNOSIS — I69954 Hemiplegia and hemiparesis following unspecified cerebrovascular disease affecting left non-dominant side: Secondary | ICD-10-CM | POA: Diagnosis not present

## 2018-09-30 DIAGNOSIS — M24542 Contracture, left hand: Secondary | ICD-10-CM | POA: Diagnosis not present

## 2018-10-02 DIAGNOSIS — B353 Tinea pedis: Secondary | ICD-10-CM | POA: Diagnosis not present

## 2018-10-02 DIAGNOSIS — L84 Corns and callosities: Secondary | ICD-10-CM | POA: Diagnosis not present

## 2018-10-02 DIAGNOSIS — L603 Nail dystrophy: Secondary | ICD-10-CM | POA: Diagnosis not present

## 2018-10-02 DIAGNOSIS — E1140 Type 2 diabetes mellitus with diabetic neuropathy, unspecified: Secondary | ICD-10-CM | POA: Diagnosis not present

## 2018-10-02 DIAGNOSIS — B351 Tinea unguium: Secondary | ICD-10-CM | POA: Diagnosis not present

## 2018-10-07 DIAGNOSIS — E039 Hypothyroidism, unspecified: Secondary | ICD-10-CM | POA: Diagnosis not present

## 2018-10-07 DIAGNOSIS — N390 Urinary tract infection, site not specified: Secondary | ICD-10-CM | POA: Diagnosis not present

## 2018-10-07 DIAGNOSIS — A0682 Other amebic genitourinary infections: Secondary | ICD-10-CM | POA: Diagnosis not present

## 2018-10-07 DIAGNOSIS — E038 Other specified hypothyroidism: Secondary | ICD-10-CM | POA: Diagnosis not present

## 2018-10-07 DIAGNOSIS — E1165 Type 2 diabetes mellitus with hyperglycemia: Secondary | ICD-10-CM | POA: Diagnosis not present

## 2018-10-07 DIAGNOSIS — I10 Essential (primary) hypertension: Secondary | ICD-10-CM | POA: Diagnosis not present

## 2018-10-07 DIAGNOSIS — E1151 Type 2 diabetes mellitus with diabetic peripheral angiopathy without gangrene: Secondary | ICD-10-CM | POA: Diagnosis not present

## 2018-10-16 DIAGNOSIS — E119 Type 2 diabetes mellitus without complications: Secondary | ICD-10-CM | POA: Diagnosis not present

## 2018-10-16 DIAGNOSIS — I2510 Atherosclerotic heart disease of native coronary artery without angina pectoris: Secondary | ICD-10-CM | POA: Diagnosis not present

## 2018-10-16 DIAGNOSIS — G8194 Hemiplegia, unspecified affecting left nondominant side: Secondary | ICD-10-CM | POA: Diagnosis not present

## 2018-10-16 DIAGNOSIS — E039 Hypothyroidism, unspecified: Secondary | ICD-10-CM | POA: Diagnosis not present

## 2018-11-11 DIAGNOSIS — Z961 Presence of intraocular lens: Secondary | ICD-10-CM | POA: Diagnosis not present

## 2018-11-11 DIAGNOSIS — E119 Type 2 diabetes mellitus without complications: Secondary | ICD-10-CM | POA: Diagnosis not present

## 2018-11-13 DIAGNOSIS — F329 Major depressive disorder, single episode, unspecified: Secondary | ICD-10-CM | POA: Diagnosis not present

## 2018-11-13 DIAGNOSIS — F3181 Bipolar II disorder: Secondary | ICD-10-CM | POA: Diagnosis not present

## 2018-11-13 DIAGNOSIS — F064 Anxiety disorder due to known physiological condition: Secondary | ICD-10-CM | POA: Diagnosis not present

## 2018-11-13 DIAGNOSIS — G4700 Insomnia, unspecified: Secondary | ICD-10-CM | POA: Diagnosis not present

## 2018-11-20 DIAGNOSIS — Z79899 Other long term (current) drug therapy: Secondary | ICD-10-CM | POA: Diagnosis not present

## 2018-11-20 DIAGNOSIS — E559 Vitamin D deficiency, unspecified: Secondary | ICD-10-CM | POA: Diagnosis not present

## 2018-11-20 DIAGNOSIS — E039 Hypothyroidism, unspecified: Secondary | ICD-10-CM | POA: Diagnosis not present

## 2018-12-17 DIAGNOSIS — F329 Major depressive disorder, single episode, unspecified: Secondary | ICD-10-CM | POA: Diagnosis not present

## 2018-12-17 DIAGNOSIS — G4700 Insomnia, unspecified: Secondary | ICD-10-CM | POA: Diagnosis not present

## 2018-12-17 DIAGNOSIS — F3181 Bipolar II disorder: Secondary | ICD-10-CM | POA: Diagnosis not present

## 2018-12-18 DIAGNOSIS — L400 Psoriasis vulgaris: Secondary | ICD-10-CM | POA: Diagnosis not present

## 2018-12-18 DIAGNOSIS — E559 Vitamin D deficiency, unspecified: Secondary | ICD-10-CM | POA: Diagnosis not present

## 2018-12-18 DIAGNOSIS — J449 Chronic obstructive pulmonary disease, unspecified: Secondary | ICD-10-CM | POA: Diagnosis not present

## 2018-12-18 DIAGNOSIS — I69352 Hemiplegia and hemiparesis following cerebral infarction affecting left dominant side: Secondary | ICD-10-CM | POA: Diagnosis not present

## 2018-12-27 DIAGNOSIS — L400 Psoriasis vulgaris: Secondary | ICD-10-CM | POA: Diagnosis not present

## 2019-01-03 DIAGNOSIS — D649 Anemia, unspecified: Secondary | ICD-10-CM | POA: Diagnosis not present

## 2019-01-03 DIAGNOSIS — L400 Psoriasis vulgaris: Secondary | ICD-10-CM | POA: Diagnosis not present

## 2019-01-03 DIAGNOSIS — R52 Pain, unspecified: Secondary | ICD-10-CM | POA: Diagnosis not present

## 2019-01-04 DIAGNOSIS — I69954 Hemiplegia and hemiparesis following unspecified cerebrovascular disease affecting left non-dominant side: Secondary | ICD-10-CM | POA: Diagnosis not present

## 2019-01-04 DIAGNOSIS — M24542 Contracture, left hand: Secondary | ICD-10-CM | POA: Diagnosis not present

## 2019-01-04 DIAGNOSIS — R41841 Cognitive communication deficit: Secondary | ICD-10-CM | POA: Diagnosis not present

## 2019-01-04 DIAGNOSIS — R1319 Other dysphagia: Secondary | ICD-10-CM | POA: Diagnosis not present

## 2019-01-05 DIAGNOSIS — M24542 Contracture, left hand: Secondary | ICD-10-CM | POA: Diagnosis not present

## 2019-01-05 DIAGNOSIS — I69954 Hemiplegia and hemiparesis following unspecified cerebrovascular disease affecting left non-dominant side: Secondary | ICD-10-CM | POA: Diagnosis not present

## 2019-01-05 DIAGNOSIS — R1319 Other dysphagia: Secondary | ICD-10-CM | POA: Diagnosis not present

## 2019-01-05 DIAGNOSIS — R41841 Cognitive communication deficit: Secondary | ICD-10-CM | POA: Diagnosis not present

## 2019-01-06 DIAGNOSIS — R41841 Cognitive communication deficit: Secondary | ICD-10-CM | POA: Diagnosis not present

## 2019-01-06 DIAGNOSIS — M24542 Contracture, left hand: Secondary | ICD-10-CM | POA: Diagnosis not present

## 2019-01-06 DIAGNOSIS — I69954 Hemiplegia and hemiparesis following unspecified cerebrovascular disease affecting left non-dominant side: Secondary | ICD-10-CM | POA: Diagnosis not present

## 2019-01-06 DIAGNOSIS — R1319 Other dysphagia: Secondary | ICD-10-CM | POA: Diagnosis not present

## 2019-01-07 DIAGNOSIS — M24542 Contracture, left hand: Secondary | ICD-10-CM | POA: Diagnosis not present

## 2019-01-07 DIAGNOSIS — I69954 Hemiplegia and hemiparesis following unspecified cerebrovascular disease affecting left non-dominant side: Secondary | ICD-10-CM | POA: Diagnosis not present

## 2019-01-07 DIAGNOSIS — R41841 Cognitive communication deficit: Secondary | ICD-10-CM | POA: Diagnosis not present

## 2019-01-07 DIAGNOSIS — R1319 Other dysphagia: Secondary | ICD-10-CM | POA: Diagnosis not present

## 2019-01-08 DIAGNOSIS — I69954 Hemiplegia and hemiparesis following unspecified cerebrovascular disease affecting left non-dominant side: Secondary | ICD-10-CM | POA: Diagnosis not present

## 2019-01-08 DIAGNOSIS — R41841 Cognitive communication deficit: Secondary | ICD-10-CM | POA: Diagnosis not present

## 2019-01-08 DIAGNOSIS — M24542 Contracture, left hand: Secondary | ICD-10-CM | POA: Diagnosis not present

## 2019-01-08 DIAGNOSIS — R1319 Other dysphagia: Secondary | ICD-10-CM | POA: Diagnosis not present

## 2019-01-09 DIAGNOSIS — R1319 Other dysphagia: Secondary | ICD-10-CM | POA: Diagnosis not present

## 2019-01-09 DIAGNOSIS — R41841 Cognitive communication deficit: Secondary | ICD-10-CM | POA: Diagnosis not present

## 2019-01-09 DIAGNOSIS — M24542 Contracture, left hand: Secondary | ICD-10-CM | POA: Diagnosis not present

## 2019-01-09 DIAGNOSIS — I69954 Hemiplegia and hemiparesis following unspecified cerebrovascular disease affecting left non-dominant side: Secondary | ICD-10-CM | POA: Diagnosis not present

## 2019-01-10 DIAGNOSIS — E785 Hyperlipidemia, unspecified: Secondary | ICD-10-CM | POA: Diagnosis not present

## 2019-01-10 DIAGNOSIS — E039 Hypothyroidism, unspecified: Secondary | ICD-10-CM | POA: Diagnosis not present

## 2019-01-10 DIAGNOSIS — L400 Psoriasis vulgaris: Secondary | ICD-10-CM | POA: Diagnosis not present

## 2019-01-12 DIAGNOSIS — R41841 Cognitive communication deficit: Secondary | ICD-10-CM | POA: Diagnosis not present

## 2019-01-12 DIAGNOSIS — I69954 Hemiplegia and hemiparesis following unspecified cerebrovascular disease affecting left non-dominant side: Secondary | ICD-10-CM | POA: Diagnosis not present

## 2019-01-12 DIAGNOSIS — M24542 Contracture, left hand: Secondary | ICD-10-CM | POA: Diagnosis not present

## 2019-01-12 DIAGNOSIS — R1319 Other dysphagia: Secondary | ICD-10-CM | POA: Diagnosis not present

## 2019-01-13 DIAGNOSIS — I69954 Hemiplegia and hemiparesis following unspecified cerebrovascular disease affecting left non-dominant side: Secondary | ICD-10-CM | POA: Diagnosis not present

## 2019-01-13 DIAGNOSIS — M24542 Contracture, left hand: Secondary | ICD-10-CM | POA: Diagnosis not present

## 2019-01-13 DIAGNOSIS — R41841 Cognitive communication deficit: Secondary | ICD-10-CM | POA: Diagnosis not present

## 2019-01-13 DIAGNOSIS — R1319 Other dysphagia: Secondary | ICD-10-CM | POA: Diagnosis not present

## 2019-01-14 DIAGNOSIS — G4700 Insomnia, unspecified: Secondary | ICD-10-CM | POA: Diagnosis not present

## 2019-01-14 DIAGNOSIS — R41841 Cognitive communication deficit: Secondary | ICD-10-CM | POA: Diagnosis not present

## 2019-01-14 DIAGNOSIS — M24542 Contracture, left hand: Secondary | ICD-10-CM | POA: Diagnosis not present

## 2019-01-14 DIAGNOSIS — F329 Major depressive disorder, single episode, unspecified: Secondary | ICD-10-CM | POA: Diagnosis not present

## 2019-01-14 DIAGNOSIS — F3181 Bipolar II disorder: Secondary | ICD-10-CM | POA: Diagnosis not present

## 2019-01-14 DIAGNOSIS — R1319 Other dysphagia: Secondary | ICD-10-CM | POA: Diagnosis not present

## 2019-01-14 DIAGNOSIS — I69954 Hemiplegia and hemiparesis following unspecified cerebrovascular disease affecting left non-dominant side: Secondary | ICD-10-CM | POA: Diagnosis not present

## 2019-01-15 DIAGNOSIS — I69954 Hemiplegia and hemiparesis following unspecified cerebrovascular disease affecting left non-dominant side: Secondary | ICD-10-CM | POA: Diagnosis not present

## 2019-01-15 DIAGNOSIS — M24542 Contracture, left hand: Secondary | ICD-10-CM | POA: Diagnosis not present

## 2019-01-15 DIAGNOSIS — R41841 Cognitive communication deficit: Secondary | ICD-10-CM | POA: Diagnosis not present

## 2019-01-15 DIAGNOSIS — R1319 Other dysphagia: Secondary | ICD-10-CM | POA: Diagnosis not present

## 2019-01-16 DIAGNOSIS — M24542 Contracture, left hand: Secondary | ICD-10-CM | POA: Diagnosis not present

## 2019-01-16 DIAGNOSIS — R41841 Cognitive communication deficit: Secondary | ICD-10-CM | POA: Diagnosis not present

## 2019-01-16 DIAGNOSIS — M25521 Pain in right elbow: Secondary | ICD-10-CM | POA: Diagnosis not present

## 2019-01-16 DIAGNOSIS — I69954 Hemiplegia and hemiparesis following unspecified cerebrovascular disease affecting left non-dominant side: Secondary | ICD-10-CM | POA: Diagnosis not present

## 2019-01-16 DIAGNOSIS — R1319 Other dysphagia: Secondary | ICD-10-CM | POA: Diagnosis not present

## 2019-01-16 DIAGNOSIS — Z79899 Other long term (current) drug therapy: Secondary | ICD-10-CM | POA: Diagnosis not present

## 2019-01-17 DIAGNOSIS — R41841 Cognitive communication deficit: Secondary | ICD-10-CM | POA: Diagnosis not present

## 2019-01-17 DIAGNOSIS — I69954 Hemiplegia and hemiparesis following unspecified cerebrovascular disease affecting left non-dominant side: Secondary | ICD-10-CM | POA: Diagnosis not present

## 2019-01-17 DIAGNOSIS — M24542 Contracture, left hand: Secondary | ICD-10-CM | POA: Diagnosis not present

## 2019-01-17 DIAGNOSIS — R1319 Other dysphagia: Secondary | ICD-10-CM | POA: Diagnosis not present

## 2019-01-18 DIAGNOSIS — R41841 Cognitive communication deficit: Secondary | ICD-10-CM | POA: Diagnosis not present

## 2019-01-18 DIAGNOSIS — R1319 Other dysphagia: Secondary | ICD-10-CM | POA: Diagnosis not present

## 2019-01-18 DIAGNOSIS — I69954 Hemiplegia and hemiparesis following unspecified cerebrovascular disease affecting left non-dominant side: Secondary | ICD-10-CM | POA: Diagnosis not present

## 2019-01-18 DIAGNOSIS — M24542 Contracture, left hand: Secondary | ICD-10-CM | POA: Diagnosis not present

## 2019-01-21 DIAGNOSIS — R1319 Other dysphagia: Secondary | ICD-10-CM | POA: Diagnosis not present

## 2019-01-21 DIAGNOSIS — R41841 Cognitive communication deficit: Secondary | ICD-10-CM | POA: Diagnosis not present

## 2019-01-21 DIAGNOSIS — I69954 Hemiplegia and hemiparesis following unspecified cerebrovascular disease affecting left non-dominant side: Secondary | ICD-10-CM | POA: Diagnosis not present

## 2019-01-21 DIAGNOSIS — M24542 Contracture, left hand: Secondary | ICD-10-CM | POA: Diagnosis not present

## 2019-01-22 DIAGNOSIS — I69954 Hemiplegia and hemiparesis following unspecified cerebrovascular disease affecting left non-dominant side: Secondary | ICD-10-CM | POA: Diagnosis not present

## 2019-01-22 DIAGNOSIS — M24542 Contracture, left hand: Secondary | ICD-10-CM | POA: Diagnosis not present

## 2019-01-22 DIAGNOSIS — R1319 Other dysphagia: Secondary | ICD-10-CM | POA: Diagnosis not present

## 2019-01-22 DIAGNOSIS — R41841 Cognitive communication deficit: Secondary | ICD-10-CM | POA: Diagnosis not present

## 2019-01-23 DIAGNOSIS — I69954 Hemiplegia and hemiparesis following unspecified cerebrovascular disease affecting left non-dominant side: Secondary | ICD-10-CM | POA: Diagnosis not present

## 2019-01-23 DIAGNOSIS — R1319 Other dysphagia: Secondary | ICD-10-CM | POA: Diagnosis not present

## 2019-01-23 DIAGNOSIS — R41841 Cognitive communication deficit: Secondary | ICD-10-CM | POA: Diagnosis not present

## 2019-01-23 DIAGNOSIS — M24542 Contracture, left hand: Secondary | ICD-10-CM | POA: Diagnosis not present

## 2019-01-24 DIAGNOSIS — I69954 Hemiplegia and hemiparesis following unspecified cerebrovascular disease affecting left non-dominant side: Secondary | ICD-10-CM | POA: Diagnosis not present

## 2019-01-24 DIAGNOSIS — D649 Anemia, unspecified: Secondary | ICD-10-CM | POA: Diagnosis not present

## 2019-01-24 DIAGNOSIS — E782 Mixed hyperlipidemia: Secondary | ICD-10-CM | POA: Diagnosis not present

## 2019-01-24 DIAGNOSIS — M24542 Contracture, left hand: Secondary | ICD-10-CM | POA: Diagnosis not present

## 2019-01-24 DIAGNOSIS — R1319 Other dysphagia: Secondary | ICD-10-CM | POA: Diagnosis not present

## 2019-01-24 DIAGNOSIS — R41841 Cognitive communication deficit: Secondary | ICD-10-CM | POA: Diagnosis not present

## 2019-01-24 DIAGNOSIS — E119 Type 2 diabetes mellitus without complications: Secondary | ICD-10-CM | POA: Diagnosis not present

## 2019-01-24 DIAGNOSIS — K219 Gastro-esophageal reflux disease without esophagitis: Secondary | ICD-10-CM | POA: Diagnosis not present

## 2019-01-26 DIAGNOSIS — I69954 Hemiplegia and hemiparesis following unspecified cerebrovascular disease affecting left non-dominant side: Secondary | ICD-10-CM | POA: Diagnosis not present

## 2019-01-26 DIAGNOSIS — M24542 Contracture, left hand: Secondary | ICD-10-CM | POA: Diagnosis not present

## 2019-01-26 DIAGNOSIS — R1319 Other dysphagia: Secondary | ICD-10-CM | POA: Diagnosis not present

## 2019-01-26 DIAGNOSIS — R41841 Cognitive communication deficit: Secondary | ICD-10-CM | POA: Diagnosis not present

## 2019-01-28 DIAGNOSIS — M24542 Contracture, left hand: Secondary | ICD-10-CM | POA: Diagnosis not present

## 2019-01-28 DIAGNOSIS — I69954 Hemiplegia and hemiparesis following unspecified cerebrovascular disease affecting left non-dominant side: Secondary | ICD-10-CM | POA: Diagnosis not present

## 2019-01-28 DIAGNOSIS — R1319 Other dysphagia: Secondary | ICD-10-CM | POA: Diagnosis not present

## 2019-01-28 DIAGNOSIS — R41841 Cognitive communication deficit: Secondary | ICD-10-CM | POA: Diagnosis not present

## 2019-01-29 DIAGNOSIS — R1319 Other dysphagia: Secondary | ICD-10-CM | POA: Diagnosis not present

## 2019-01-29 DIAGNOSIS — M24542 Contracture, left hand: Secondary | ICD-10-CM | POA: Diagnosis not present

## 2019-01-29 DIAGNOSIS — I69954 Hemiplegia and hemiparesis following unspecified cerebrovascular disease affecting left non-dominant side: Secondary | ICD-10-CM | POA: Diagnosis not present

## 2019-01-29 DIAGNOSIS — R41841 Cognitive communication deficit: Secondary | ICD-10-CM | POA: Diagnosis not present

## 2019-01-30 DIAGNOSIS — M24542 Contracture, left hand: Secondary | ICD-10-CM | POA: Diagnosis not present

## 2019-01-30 DIAGNOSIS — R41841 Cognitive communication deficit: Secondary | ICD-10-CM | POA: Diagnosis not present

## 2019-01-30 DIAGNOSIS — R1319 Other dysphagia: Secondary | ICD-10-CM | POA: Diagnosis not present

## 2019-01-30 DIAGNOSIS — I69954 Hemiplegia and hemiparesis following unspecified cerebrovascular disease affecting left non-dominant side: Secondary | ICD-10-CM | POA: Diagnosis not present

## 2019-02-03 DIAGNOSIS — E119 Type 2 diabetes mellitus without complications: Secondary | ICD-10-CM | POA: Diagnosis not present

## 2019-02-03 DIAGNOSIS — E039 Hypothyroidism, unspecified: Secondary | ICD-10-CM | POA: Diagnosis not present

## 2019-02-03 DIAGNOSIS — L400 Psoriasis vulgaris: Secondary | ICD-10-CM | POA: Diagnosis not present

## 2019-02-03 DIAGNOSIS — I10 Essential (primary) hypertension: Secondary | ICD-10-CM | POA: Diagnosis not present

## 2019-02-09 DIAGNOSIS — G4700 Insomnia, unspecified: Secondary | ICD-10-CM | POA: Diagnosis not present

## 2019-02-09 DIAGNOSIS — F3181 Bipolar II disorder: Secondary | ICD-10-CM | POA: Diagnosis not present

## 2019-02-09 DIAGNOSIS — F329 Major depressive disorder, single episode, unspecified: Secondary | ICD-10-CM | POA: Diagnosis not present

## 2019-02-24 DIAGNOSIS — E1140 Type 2 diabetes mellitus with diabetic neuropathy, unspecified: Secondary | ICD-10-CM | POA: Diagnosis not present

## 2019-02-24 DIAGNOSIS — E785 Hyperlipidemia, unspecified: Secondary | ICD-10-CM | POA: Diagnosis not present

## 2019-02-24 DIAGNOSIS — D649 Anemia, unspecified: Secondary | ICD-10-CM | POA: Diagnosis not present

## 2019-02-24 DIAGNOSIS — E538 Deficiency of other specified B group vitamins: Secondary | ICD-10-CM | POA: Diagnosis not present

## 2019-02-26 DIAGNOSIS — E782 Mixed hyperlipidemia: Secondary | ICD-10-CM | POA: Diagnosis not present

## 2019-02-26 DIAGNOSIS — E538 Deficiency of other specified B group vitamins: Secondary | ICD-10-CM | POA: Diagnosis not present

## 2019-02-26 DIAGNOSIS — D649 Anemia, unspecified: Secondary | ICD-10-CM | POA: Diagnosis not present

## 2019-02-26 DIAGNOSIS — L400 Psoriasis vulgaris: Secondary | ICD-10-CM | POA: Diagnosis not present

## 2019-03-11 DIAGNOSIS — G4700 Insomnia, unspecified: Secondary | ICD-10-CM | POA: Diagnosis not present

## 2019-03-11 DIAGNOSIS — F3181 Bipolar II disorder: Secondary | ICD-10-CM | POA: Diagnosis not present

## 2019-03-11 DIAGNOSIS — F329 Major depressive disorder, single episode, unspecified: Secondary | ICD-10-CM | POA: Diagnosis not present

## 2019-03-25 DIAGNOSIS — D649 Anemia, unspecified: Secondary | ICD-10-CM | POA: Diagnosis not present

## 2019-03-25 DIAGNOSIS — E538 Deficiency of other specified B group vitamins: Secondary | ICD-10-CM | POA: Diagnosis not present

## 2019-03-26 DIAGNOSIS — D509 Iron deficiency anemia, unspecified: Secondary | ICD-10-CM | POA: Diagnosis not present

## 2019-03-26 DIAGNOSIS — J449 Chronic obstructive pulmonary disease, unspecified: Secondary | ICD-10-CM | POA: Diagnosis not present

## 2019-03-26 DIAGNOSIS — I10 Essential (primary) hypertension: Secondary | ICD-10-CM | POA: Diagnosis not present

## 2019-03-26 DIAGNOSIS — E538 Deficiency of other specified B group vitamins: Secondary | ICD-10-CM | POA: Diagnosis not present

## 2019-03-30 DIAGNOSIS — J449 Chronic obstructive pulmonary disease, unspecified: Secondary | ICD-10-CM | POA: Diagnosis not present

## 2019-03-31 DIAGNOSIS — R0902 Hypoxemia: Secondary | ICD-10-CM | POA: Diagnosis not present

## 2019-03-31 DIAGNOSIS — Z79899 Other long term (current) drug therapy: Secondary | ICD-10-CM | POA: Diagnosis not present

## 2019-03-31 DIAGNOSIS — E611 Iron deficiency: Secondary | ICD-10-CM | POA: Diagnosis not present

## 2019-04-02 DIAGNOSIS — D509 Iron deficiency anemia, unspecified: Secondary | ICD-10-CM | POA: Diagnosis not present

## 2019-04-02 DIAGNOSIS — R1110 Vomiting, unspecified: Secondary | ICD-10-CM | POA: Diagnosis not present

## 2019-04-02 DIAGNOSIS — J189 Pneumonia, unspecified organism: Secondary | ICD-10-CM | POA: Diagnosis not present

## 2019-04-04 DIAGNOSIS — J189 Pneumonia, unspecified organism: Secondary | ICD-10-CM | POA: Diagnosis not present

## 2019-04-05 ENCOUNTER — Other Ambulatory Visit: Payer: Self-pay

## 2019-04-05 ENCOUNTER — Encounter (HOSPITAL_COMMUNITY): Payer: Self-pay | Admitting: Emergency Medicine

## 2019-04-05 ENCOUNTER — Emergency Department (HOSPITAL_COMMUNITY): Payer: Medicare Other

## 2019-04-05 ENCOUNTER — Other Ambulatory Visit (HOSPITAL_COMMUNITY): Payer: Medicare Other

## 2019-04-05 ENCOUNTER — Inpatient Hospital Stay (HOSPITAL_COMMUNITY)
Admission: EM | Admit: 2019-04-05 | Discharge: 2019-04-12 | DRG: 871 | Disposition: E | Payer: Medicare Other | Attending: Family Medicine | Admitting: Family Medicine

## 2019-04-05 DIAGNOSIS — Z22322 Carrier or suspected carrier of Methicillin resistant Staphylococcus aureus: Secondary | ICD-10-CM

## 2019-04-05 DIAGNOSIS — Z20828 Contact with and (suspected) exposure to other viral communicable diseases: Secondary | ICD-10-CM | POA: Diagnosis present

## 2019-04-05 DIAGNOSIS — R0602 Shortness of breath: Secondary | ICD-10-CM | POA: Diagnosis not present

## 2019-04-05 DIAGNOSIS — E1149 Type 2 diabetes mellitus with other diabetic neurological complication: Secondary | ICD-10-CM | POA: Diagnosis not present

## 2019-04-05 DIAGNOSIS — M792 Neuralgia and neuritis, unspecified: Secondary | ICD-10-CM | POA: Diagnosis not present

## 2019-04-05 DIAGNOSIS — R079 Chest pain, unspecified: Secondary | ICD-10-CM | POA: Diagnosis not present

## 2019-04-05 DIAGNOSIS — I2510 Atherosclerotic heart disease of native coronary artery without angina pectoris: Secondary | ICD-10-CM | POA: Diagnosis present

## 2019-04-05 DIAGNOSIS — J90 Pleural effusion, not elsewhere classified: Secondary | ICD-10-CM | POA: Diagnosis not present

## 2019-04-05 DIAGNOSIS — D509 Iron deficiency anemia, unspecified: Secondary | ICD-10-CM | POA: Diagnosis present

## 2019-04-05 DIAGNOSIS — F419 Anxiety disorder, unspecified: Secondary | ICD-10-CM | POA: Diagnosis present

## 2019-04-05 DIAGNOSIS — J9622 Acute and chronic respiratory failure with hypercapnia: Secondary | ICD-10-CM | POA: Diagnosis present

## 2019-04-05 DIAGNOSIS — F329 Major depressive disorder, single episode, unspecified: Secondary | ICD-10-CM | POA: Diagnosis present

## 2019-04-05 DIAGNOSIS — Z7982 Long term (current) use of aspirin: Secondary | ICD-10-CM

## 2019-04-05 DIAGNOSIS — E1142 Type 2 diabetes mellitus with diabetic polyneuropathy: Secondary | ICD-10-CM | POA: Diagnosis present

## 2019-04-05 DIAGNOSIS — IMO0002 Reserved for concepts with insufficient information to code with codable children: Secondary | ICD-10-CM | POA: Diagnosis present

## 2019-04-05 DIAGNOSIS — J9621 Acute and chronic respiratory failure with hypoxia: Secondary | ICD-10-CM | POA: Diagnosis not present

## 2019-04-05 DIAGNOSIS — Z66 Do not resuscitate: Secondary | ICD-10-CM | POA: Diagnosis present

## 2019-04-05 DIAGNOSIS — F1120 Opioid dependence, uncomplicated: Secondary | ICD-10-CM | POA: Diagnosis present

## 2019-04-05 DIAGNOSIS — Z955 Presence of coronary angioplasty implant and graft: Secondary | ICD-10-CM

## 2019-04-05 DIAGNOSIS — I69354 Hemiplegia and hemiparesis following cerebral infarction affecting left non-dominant side: Secondary | ICD-10-CM

## 2019-04-05 DIAGNOSIS — Z7952 Long term (current) use of systemic steroids: Secondary | ICD-10-CM

## 2019-04-05 DIAGNOSIS — J440 Chronic obstructive pulmonary disease with acute lower respiratory infection: Secondary | ICD-10-CM | POA: Diagnosis present

## 2019-04-05 DIAGNOSIS — Z87891 Personal history of nicotine dependence: Secondary | ICD-10-CM

## 2019-04-05 DIAGNOSIS — I5033 Acute on chronic diastolic (congestive) heart failure: Secondary | ICD-10-CM | POA: Diagnosis present

## 2019-04-05 DIAGNOSIS — Z79890 Hormone replacement therapy (postmenopausal): Secondary | ICD-10-CM

## 2019-04-05 DIAGNOSIS — Z9981 Dependence on supplemental oxygen: Secondary | ICD-10-CM

## 2019-04-05 DIAGNOSIS — J301 Allergic rhinitis due to pollen: Secondary | ICD-10-CM | POA: Diagnosis present

## 2019-04-05 DIAGNOSIS — Z79891 Long term (current) use of opiate analgesic: Secondary | ICD-10-CM

## 2019-04-05 DIAGNOSIS — Z6830 Body mass index (BMI) 30.0-30.9, adult: Secondary | ICD-10-CM

## 2019-04-05 DIAGNOSIS — E669 Obesity, unspecified: Secondary | ICD-10-CM | POA: Diagnosis present

## 2019-04-05 DIAGNOSIS — G8929 Other chronic pain: Secondary | ICD-10-CM | POA: Diagnosis present

## 2019-04-05 DIAGNOSIS — J189 Pneumonia, unspecified organism: Secondary | ICD-10-CM | POA: Diagnosis not present

## 2019-04-05 DIAGNOSIS — R451 Restlessness and agitation: Secondary | ICD-10-CM | POA: Diagnosis not present

## 2019-04-05 DIAGNOSIS — I4891 Unspecified atrial fibrillation: Secondary | ICD-10-CM | POA: Diagnosis not present

## 2019-04-05 DIAGNOSIS — E1165 Type 2 diabetes mellitus with hyperglycemia: Secondary | ICD-10-CM

## 2019-04-05 DIAGNOSIS — G8114 Spastic hemiplegia affecting left nondominant side: Secondary | ICD-10-CM

## 2019-04-05 DIAGNOSIS — I509 Heart failure, unspecified: Secondary | ICD-10-CM

## 2019-04-05 DIAGNOSIS — E039 Hypothyroidism, unspecified: Secondary | ICD-10-CM | POA: Diagnosis present

## 2019-04-05 DIAGNOSIS — I10 Essential (primary) hypertension: Secondary | ICD-10-CM | POA: Diagnosis not present

## 2019-04-05 DIAGNOSIS — E785 Hyperlipidemia, unspecified: Secondary | ICD-10-CM

## 2019-04-05 DIAGNOSIS — Z515 Encounter for palliative care: Secondary | ICD-10-CM | POA: Diagnosis not present

## 2019-04-05 DIAGNOSIS — J181 Lobar pneumonia, unspecified organism: Secondary | ICD-10-CM | POA: Diagnosis present

## 2019-04-05 DIAGNOSIS — I110 Hypertensive heart disease with heart failure: Secondary | ICD-10-CM | POA: Diagnosis present

## 2019-04-05 DIAGNOSIS — Z888 Allergy status to other drugs, medicaments and biological substances status: Secondary | ICD-10-CM

## 2019-04-05 DIAGNOSIS — A419 Sepsis, unspecified organism: Principal | ICD-10-CM | POA: Diagnosis present

## 2019-04-05 DIAGNOSIS — Z7189 Other specified counseling: Secondary | ICD-10-CM | POA: Diagnosis not present

## 2019-04-05 DIAGNOSIS — Z91018 Allergy to other foods: Secondary | ICD-10-CM

## 2019-04-05 DIAGNOSIS — Z9049 Acquired absence of other specified parts of digestive tract: Secondary | ICD-10-CM

## 2019-04-05 DIAGNOSIS — Z885 Allergy status to narcotic agent status: Secondary | ICD-10-CM

## 2019-04-05 DIAGNOSIS — Z79899 Other long term (current) drug therapy: Secondary | ICD-10-CM

## 2019-04-05 DIAGNOSIS — Z96643 Presence of artificial hip joint, bilateral: Secondary | ICD-10-CM

## 2019-04-05 DIAGNOSIS — Z833 Family history of diabetes mellitus: Secondary | ICD-10-CM

## 2019-04-05 DIAGNOSIS — Z794 Long term (current) use of insulin: Secondary | ICD-10-CM

## 2019-04-05 LAB — COMPREHENSIVE METABOLIC PANEL
ALT: 16 U/L (ref 0–44)
AST: 20 U/L (ref 15–41)
Albumin: 3.1 g/dL — ABNORMAL LOW (ref 3.5–5.0)
Alkaline Phosphatase: 85 U/L (ref 38–126)
Anion gap: 13 (ref 5–15)
BUN: 19 mg/dL (ref 8–23)
CO2: 33 mmol/L — ABNORMAL HIGH (ref 22–32)
Calcium: 8.9 mg/dL (ref 8.9–10.3)
Chloride: 95 mmol/L — ABNORMAL LOW (ref 98–111)
Creatinine, Ser: 0.57 mg/dL (ref 0.44–1.00)
GFR calc Af Amer: >60 mL/min (ref >60)
GFR calc non Af Amer: >60 mL/min (ref >60)
Glucose, Bld: 148 mg/dL — ABNORMAL HIGH (ref 70–99)
Potassium: 4.8 mmol/L (ref 3.5–5.1)
Sodium: 141 mmol/L (ref 135–145)
Total Bilirubin: 0.7 mg/dL (ref 0.3–1.2)
Total Protein: 7.3 g/dL (ref 6.5–8.1)

## 2019-04-05 LAB — CBC WITH DIFFERENTIAL/PLATELET
Abs Immature Granulocytes: 0.3 10*3/uL — ABNORMAL HIGH (ref 0.00–0.07)
Basophils Absolute: 0 10*3/uL (ref 0.0–0.1)
Basophils Relative: 0 %
Eosinophils Absolute: 0 10*3/uL (ref 0.0–0.5)
Eosinophils Relative: 0 %
HCT: 38.5 % (ref 36.0–46.0)
Hemoglobin: 10.7 g/dL — ABNORMAL LOW (ref 12.0–15.0)
Immature Granulocytes: 2 %
Lymphocytes Relative: 3 %
Lymphs Abs: 0.4 10*3/uL — ABNORMAL LOW (ref 0.7–4.0)
MCH: 25.8 pg — ABNORMAL LOW (ref 26.0–34.0)
MCHC: 27.8 g/dL — ABNORMAL LOW (ref 30.0–36.0)
MCV: 93 fL (ref 80.0–100.0)
Monocytes Absolute: 0.2 10*3/uL (ref 0.1–1.0)
Monocytes Relative: 1 %
Neutro Abs: 12.7 10*3/uL — ABNORMAL HIGH (ref 1.7–7.7)
Neutrophils Relative %: 94 %
Platelets: 282 10*3/uL (ref 150–400)
RBC: 4.14 MIL/uL (ref 3.87–5.11)
RDW: 18.3 % — ABNORMAL HIGH (ref 11.5–15.5)
WBC: 13.7 10*3/uL — ABNORMAL HIGH (ref 4.0–10.5)
nRBC: 0.1 % (ref 0.0–0.2)

## 2019-04-05 LAB — URINALYSIS, ROUTINE W REFLEX MICROSCOPIC
Bilirubin Urine: NEGATIVE
Glucose, UA: 50 mg/dL — AB
Hgb urine dipstick: NEGATIVE
Ketones, ur: 20 mg/dL — AB
Nitrite: NEGATIVE
Protein, ur: >=300 mg/dL — AB
Specific Gravity, Urine: 1.039 — ABNORMAL HIGH (ref 1.005–1.030)
pH: 5 (ref 5.0–8.0)

## 2019-04-05 LAB — GLUCOSE, CAPILLARY
Glucose-Capillary: 140 mg/dL — ABNORMAL HIGH (ref 70–99)
Glucose-Capillary: 128 mg/dL — ABNORMAL HIGH (ref 70–99)
Glucose-Capillary: 154 mg/dL — ABNORMAL HIGH (ref 70–99)
Glucose-Capillary: 83 mg/dL (ref 70–99)

## 2019-04-05 LAB — STREP PNEUMONIAE URINARY ANTIGEN: Strep Pneumo Urinary Antigen: NEGATIVE

## 2019-04-05 LAB — PROTIME-INR
INR: 1.1 (ref 0.8–1.2)
Prothrombin Time: 14.5 seconds (ref 11.4–15.2)

## 2019-04-05 LAB — MAGNESIUM: Magnesium: 2.2 mg/dL (ref 1.7–2.4)

## 2019-04-05 LAB — APTT: aPTT: 35 seconds (ref 24–36)

## 2019-04-05 LAB — TROPONIN I (HIGH SENSITIVITY)
Troponin I (High Sensitivity): 9 ng/L (ref <18)
Troponin I (High Sensitivity): 13 ng/L (ref <18)

## 2019-04-05 LAB — SARS CORONAVIRUS 2 BY RT PCR (HOSPITAL ORDER, PERFORMED IN ~~LOC~~ HOSPITAL LAB): SARS Coronavirus 2: NEGATIVE

## 2019-04-05 LAB — PROCALCITONIN: Procalcitonin: 0.14 ng/mL

## 2019-04-05 LAB — TSH: TSH: 0.276 u[IU]/mL — ABNORMAL LOW (ref 0.350–4.500)

## 2019-04-05 LAB — BRAIN NATRIURETIC PEPTIDE: B Natriuretic Peptide: 501.2 pg/mL — ABNORMAL HIGH (ref 0.0–100.0)

## 2019-04-05 LAB — LACTIC ACID, PLASMA: Lactic Acid, Venous: 1.4 mmol/L (ref 0.5–1.9)

## 2019-04-05 MED ORDER — ASPIRIN 81 MG PO CHEW: 81.0000 mg | CHEWABLE_TABLET | Freq: Every day | ORAL | Status: DC

## 2019-04-05 MED ORDER — ENOXAPARIN SODIUM 60 MG/0.6ML ~~LOC~~ SOLN
60.0000 mg | Freq: Two times a day (BID) | SUBCUTANEOUS | Status: DC
  Administered 2019-04-05 – 2019-04-07 (×5): 60 mg via SUBCUTANEOUS
  Filled 2019-04-05 (×6): qty 0.6

## 2019-04-05 MED ORDER — TIOTROPIUM BROMIDE MONOHYDRATE 18 MCG IN CAPS: 18.0000 ug | ORAL_CAPSULE | Freq: Every morning | RESPIRATORY_TRACT | Status: DC

## 2019-04-05 MED ORDER — SODIUM CHLORIDE 0.9 % IV BOLUS
1000.0000 mL | Freq: Once | INTRAVENOUS | Status: AC
  Administered 2019-04-05: 1000 mL via INTRAVENOUS

## 2019-04-05 MED ORDER — TRAZODONE HCL 50 MG PO TABS
125.0000 mg | ORAL_TABLET | Freq: Every day | ORAL | Status: DC
  Administered 2019-04-05: 125 mg via ORAL
  Filled 2019-04-05 (×2): qty 1

## 2019-04-05 MED ORDER — LEVOTHYROXINE SODIUM 50 MCG PO TABS
50.0000 ug | ORAL_TABLET | Freq: Every day | ORAL | Status: DC
  Administered 2019-04-06 – 2019-04-08 (×3): 50 ug via ORAL
  Filled 2019-04-05 (×3): qty 1

## 2019-04-05 MED ORDER — ISOSORBIDE MONONITRATE ER 30 MG PO TB24
30.0000 mg | ORAL_TABLET | Freq: Every evening | ORAL | Status: DC
  Administered 2019-04-05: 30 mg via ORAL
  Filled 2019-04-05: qty 1

## 2019-04-05 MED ORDER — LIDOCAINE 4 % EX PTCH: 2.0000 | MEDICATED_PATCH | CUTANEOUS | Status: DC

## 2019-04-05 MED ORDER — ATORVASTATIN CALCIUM 80 MG PO TABS
80.0000 mg | ORAL_TABLET | Freq: Every day | ORAL | Status: DC
  Administered 2019-04-06 – 2019-04-07 (×2): 80 mg via ORAL
  Filled 2019-04-05 (×2): qty 1

## 2019-04-05 MED ORDER — FUROSEMIDE 10 MG/ML IJ SOLN
80.0000 mg | Freq: Once | INTRAMUSCULAR | Status: AC
  Administered 2019-04-05: 80 mg via INTRAVENOUS

## 2019-04-05 MED ORDER — SODIUM CHLORIDE 0.9 % IV SOLN
500.0000 mg | INTRAVENOUS | Status: AC
  Administered 2019-04-06 – 2019-04-09 (×4): 500 mg via INTRAVENOUS
  Filled 2019-04-05 (×4): qty 500

## 2019-04-05 MED ORDER — INSULIN ASPART 100 UNIT/ML ~~LOC~~ SOLN
0.0000 [IU] | Freq: Three times a day (TID) | SUBCUTANEOUS | Status: DC
  Administered 2019-04-05: 4 [IU] via SUBCUTANEOUS
  Administered 2019-04-06: 18:00:00 7 [IU] via SUBCUTANEOUS
  Administered 2019-04-06 – 2019-04-07 (×2): 3 [IU] via SUBCUTANEOUS
  Administered 2019-04-07 – 2019-04-08 (×2): 4 [IU] via SUBCUTANEOUS
  Administered 2019-04-08: 3 [IU] via SUBCUTANEOUS
  Administered 2019-04-08 – 2019-04-09 (×2): 4 [IU] via SUBCUTANEOUS
  Administered 2019-04-09: 7 [IU] via SUBCUTANEOUS
  Administered 2019-04-10: 11 [IU] via SUBCUTANEOUS
  Administered 2019-04-10: 7 [IU] via SUBCUTANEOUS

## 2019-04-05 MED ORDER — BACLOFEN 5 MG HALF TABLET
10.0000 mg | ORAL_TABLET | Freq: Three times a day (TID) | ORAL | Status: DC
  Administered 2019-04-05 – 2019-04-10 (×10): 10 mg via ORAL
  Filled 2019-04-05: qty 1
  Filled 2019-04-05 (×3): qty 2
  Filled 2019-04-05: qty 1
  Filled 2019-04-05 (×3): qty 2
  Filled 2019-04-05 (×2): qty 1
  Filled 2019-04-05: qty 2

## 2019-04-05 MED ORDER — GABAPENTIN 400 MG PO CAPS
400.0000 mg | ORAL_CAPSULE | Freq: Two times a day (BID) | ORAL | Status: DC
  Administered 2019-04-06 – 2019-04-08 (×5): 400 mg via ORAL
  Filled 2019-04-05 (×5): qty 1

## 2019-04-05 MED ORDER — FLUTICASONE PROPIONATE 50 MCG/ACT NA SUSP
2.0000 | Freq: Every morning | NASAL | Status: DC
  Administered 2019-04-07 – 2019-04-08 (×2): 2 via NASAL
  Filled 2019-04-05 (×2): qty 16

## 2019-04-05 MED ORDER — FENTANYL CITRATE (PF) 100 MCG/2ML IJ SOLN
12.5000 ug | INTRAMUSCULAR | Status: DC | PRN
  Administered 2019-04-05 (×2): 12.5 ug via INTRAVENOUS
  Filled 2019-04-05 (×3): qty 2

## 2019-04-05 MED ORDER — MELATONIN 3 MG PO TABS
6.0000 mg | ORAL_TABLET | Freq: Every day | ORAL | Status: DC
  Administered 2019-04-05: 6 mg via ORAL
  Filled 2019-04-05 (×6): qty 2

## 2019-04-05 MED ORDER — SERTRALINE HCL 100 MG PO TABS
150.0000 mg | ORAL_TABLET | Freq: Every day | ORAL | Status: DC
  Administered 2019-04-05: 150 mg via ORAL
  Filled 2019-04-05: qty 2

## 2019-04-05 MED ORDER — ACETAMINOPHEN 325 MG PO TABS
650.0000 mg | ORAL_TABLET | Freq: Four times a day (QID) | ORAL | Status: DC | PRN
  Administered 2019-04-07: 650 mg via ORAL
  Filled 2019-04-05: qty 2

## 2019-04-05 MED ORDER — DILTIAZEM HCL-DEXTROSE 100-5 MG/100ML-% IV SOLN (PREMIX)
5.0000 mg/h | INTRAVENOUS | Status: DC
  Administered 2019-04-05: 10 mg/h via INTRAVENOUS
  Administered 2019-04-05 – 2019-04-06 (×2): 5 mg/h via INTRAVENOUS
  Filled 2019-04-05 (×3): qty 100

## 2019-04-05 MED ORDER — SERTRALINE HCL 50 MG PO TABS: 50.0000 mg | ORAL_TABLET | Freq: Every day | ORAL | Status: DC

## 2019-04-05 MED ORDER — FUROSEMIDE 10 MG/ML IJ SOLN
20.0000 mg | INTRAMUSCULAR | Status: AC
  Filled 2019-04-05: qty 2

## 2019-04-05 MED ORDER — LIDOCAINE 5 % EX PTCH
2.0000 | MEDICATED_PATCH | CUTANEOUS | Status: DC
  Filled 2019-04-05 (×4): qty 2

## 2019-04-05 MED ORDER — PANTOPRAZOLE SODIUM 40 MG PO TBEC
40.0000 mg | DELAYED_RELEASE_TABLET | Freq: Every day | ORAL | Status: DC
  Administered 2019-04-05 – 2019-04-10 (×5): 40 mg via ORAL
  Filled 2019-04-05 (×6): qty 1

## 2019-04-05 MED ORDER — UMECLIDINIUM BROMIDE 62.5 MCG/INH IN AEPB
1.0000 | INHALATION_SPRAY | Freq: Every day | RESPIRATORY_TRACT | Status: DC
  Administered 2019-04-06 – 2019-04-08 (×3): 1 via RESPIRATORY_TRACT
  Filled 2019-04-05: qty 7

## 2019-04-05 MED ORDER — SERTRALINE HCL 100 MG PO TABS
200.0000 mg | ORAL_TABLET | Freq: Every day | ORAL | Status: DC
  Administered 2019-04-06 – 2019-04-10 (×4): 200 mg via ORAL
  Filled 2019-04-05 (×5): qty 2

## 2019-04-05 MED ORDER — INSULIN ASPART 100 UNIT/ML ~~LOC~~ SOLN
0.0000 [IU] | Freq: Every day | SUBCUTANEOUS | Status: DC
  Administered 2019-04-09: 2 [IU] via SUBCUTANEOUS

## 2019-04-05 MED ORDER — METOPROLOL TARTRATE 5 MG/5ML IV SOLN
5.0000 mg | Freq: Every day | INTRAVENOUS | Status: DC
  Administered 2019-04-05 – 2019-04-06 (×4): 5 mg via INTRAVENOUS
  Filled 2019-04-05 (×4): qty 5

## 2019-04-05 MED ORDER — GABAPENTIN 400 MG PO CAPS
1200.0000 mg | ORAL_CAPSULE | Freq: Every day | ORAL | Status: DC
  Administered 2019-04-05 – 2019-04-07 (×3): 1200 mg via ORAL
  Filled 2019-04-05 (×3): qty 3

## 2019-04-05 MED ORDER — FUROSEMIDE 10 MG/ML IJ SOLN
INTRAMUSCULAR | Status: AC
  Filled 2019-04-05: qty 8

## 2019-04-05 MED ORDER — GUAIFENESIN ER 600 MG PO TB12
600.0000 mg | ORAL_TABLET | Freq: Two times a day (BID) | ORAL | Status: DC
  Administered 2019-04-05 – 2019-04-10 (×8): 600 mg via ORAL
  Filled 2019-04-05 (×9): qty 1

## 2019-04-05 MED ORDER — QUETIAPINE FUMARATE 25 MG PO TABS
25.0000 mg | ORAL_TABLET | Freq: Every day | ORAL | Status: DC
  Administered 2019-04-05 – 2019-04-06 (×2): 25 mg via ORAL
  Filled 2019-04-05 (×2): qty 1

## 2019-04-05 MED ORDER — FUROSEMIDE 10 MG/ML IJ SOLN
INTRAMUSCULAR | Status: AC
  Filled 2019-04-05: qty 2

## 2019-04-05 MED ORDER — ONDANSETRON HCL 4 MG/2ML IJ SOLN
4.0000 mg | Freq: Four times a day (QID) | INTRAMUSCULAR | Status: DC | PRN
  Administered 2019-04-05 – 2019-04-09 (×10): 4 mg via INTRAVENOUS
  Filled 2019-04-05 (×10): qty 2

## 2019-04-05 MED ORDER — SODIUM CHLORIDE 0.9 % IV SOLN
1.0000 g | Freq: Once | INTRAVENOUS | Status: AC
  Administered 2019-04-05: 1 g via INTRAVENOUS
  Filled 2019-04-05: qty 10

## 2019-04-05 MED ORDER — ATORVASTATIN CALCIUM 10 MG PO TABS
10.0000 mg | ORAL_TABLET | Freq: Every day | ORAL | Status: DC
  Administered 2019-04-05: 10 mg via ORAL
  Filled 2019-04-05: qty 1

## 2019-04-05 MED ORDER — DIVALPROEX SODIUM 250 MG PO DR TAB
250.0000 mg | DELAYED_RELEASE_TABLET | Freq: Every morning | ORAL | Status: DC
  Administered 2019-04-06 – 2019-04-10 (×4): 250 mg via ORAL
  Filled 2019-04-05 (×5): qty 1

## 2019-04-05 MED ORDER — ACETAMINOPHEN 650 MG RE SUPP: 650.0000 mg | Freq: Four times a day (QID) | RECTAL | Status: DC | PRN

## 2019-04-05 MED ORDER — LEVALBUTEROL HCL 0.63 MG/3ML IN NEBU: 0.6300 mg | INHALATION_SOLUTION | Freq: Four times a day (QID) | RESPIRATORY_TRACT | Status: DC | PRN

## 2019-04-05 MED ORDER — ONDANSETRON HCL 4 MG PO TABS: 4.0000 mg | ORAL_TABLET | Freq: Four times a day (QID) | ORAL | Status: DC | PRN

## 2019-04-05 MED ORDER — OXYCODONE HCL 5 MG PO TABS
10.0000 mg | ORAL_TABLET | Freq: Three times a day (TID) | ORAL | Status: DC
  Administered 2019-04-06 – 2019-04-10 (×5): 10 mg via ORAL
  Filled 2019-04-05 (×5): qty 2

## 2019-04-05 MED ORDER — SODIUM CHLORIDE 0.9 % IV SOLN
500.0000 mg | Freq: Once | INTRAVENOUS | Status: AC
  Administered 2019-04-05: 500 mg via INTRAVENOUS
  Filled 2019-04-05: qty 500

## 2019-04-05 MED ORDER — LEVOTHYROXINE SODIUM 25 MCG PO TABS: 25.0000 ug | ORAL_TABLET | Freq: Every day | ORAL | Status: DC

## 2019-04-05 MED ORDER — SODIUM CHLORIDE 0.9% FLUSH
3.0000 mL | Freq: Two times a day (BID) | INTRAVENOUS | Status: DC
  Administered 2019-04-05 – 2019-04-09 (×9): 3 mL via INTRAVENOUS

## 2019-04-05 MED ORDER — SODIUM CHLORIDE 0.9 % IV SOLN
2.0000 g | INTRAVENOUS | Status: DC
  Administered 2019-04-06 – 2019-04-08 (×3): 2 g via INTRAVENOUS
  Filled 2019-04-05 (×3): qty 2

## 2019-04-05 MED ORDER — OXYCODONE HCL 5 MG PO TABS
10.0000 mg | ORAL_TABLET | Freq: Every day | ORAL | Status: DC
  Administered 2019-04-05: 10 mg via ORAL
  Filled 2019-04-05: qty 2

## 2019-04-05 MED ORDER — DILTIAZEM LOAD VIA INFUSION
20.0000 mg | Freq: Once | INTRAVENOUS | Status: AC
  Administered 2019-04-05: 20 mg via INTRAVENOUS
  Filled 2019-04-05: qty 20

## 2019-04-05 NOTE — Progress Notes (Signed)
RT asked by medical staff to place pt. On cannula due to pt. Not tolerating the NRB. Pt. Placed on 6L. Pt. Is irate and agitated at this time. RN x2 is at the beside.

## 2019-04-05 NOTE — Progress Notes (Signed)
ANTICOAGULATION CONSULT NOTE - Initial Consult  Pharmacy Consult for lovenox Indication: atrial fibrillation  Allergies  Allergen Reactions  . Onion Shortness Of Breath and Swelling  . Pollen Extract Itching  . Morphine And Related Hives    PT DENIES ALLERGY  . Niaspan [Niacin Er] Other (See Comments)    unknown  . Other     onions  . Trazodone And Nefazodone Other (See Comments)    Hallucinations  . Valium [Diazepam] Other (See Comments)    unknown  . Wellbutrin [Bupropion] Other (See Comments)    unknown    Patient Measurements: Height: 5\' 3"  (160 cm) Weight: 143 lb (64.9 kg) IBW/kg (Calculated) : 52.4  Vital Signs: Temp: 98.3 F (36.8 C) (07/25 0347) Temp Source: Oral (07/25 0347) BP: 127/110 (07/25 0830) Pulse Rate: 135 (07/25 0815)  Labs: Recent Labs    04/11/2019 0433  HGB 10.7*  HCT 38.5  PLT 282  CREATININE 0.57    Estimated Creatinine Clearance: 61 mL/min (by C-G formula based on SCr of 0.57 mg/dL).   Medical History: Past Medical History:  Diagnosis Date  . Anemia   . CAD (coronary artery disease)   . Chronic pain   . COPD (chronic obstructive pulmonary disease) ([place])   . Depression   . Diabetes mellitus without complication ([place])   . Fall   . Hemiplegia affecting non-dominant side, post-stroke   . Hyperlipidemia   . Hypertension   . Stroke ([place]) 12/2013   left side paralysis  . Thyroid disease   . Urine retention    Assessment: 47 yof presented to the ED in afib. To start lovenox for anticoagulation. She is not on any anticoagulation PTA. Baseline Hgb is low but platelets are WNL. No bleeding noted.   Goal of Therapy:  Anti-Xa level 0.6-1 units/ml 4hrs after LMWH dose given Monitor platelets by anticoagulation protocol: Yes   Plan:  Lovenox 60mg  SQ Q12H F/u renal fxn, S&S of bleeding, CBC Q72H F/u long-term AC plan  Abijah Roussel, Rande Lawman 04/11/2019,9:27 AM

## 2019-04-05 NOTE — ED Notes (Signed)
ED TO INPATIENT HANDOFF REPORT  ED Nurse Name and Phone #:  [phone]  S Name/Age/Gender Susan [name] 68 y.o. female Room/Bed: 023C/023C  Code Status   Code Status: Prior  Home/SNF/Other Dc to SNF [facility] AO to self only Is this baseline? NO  Triage Complete: Triage complete  Chief Complaint SOB  Triage Note Pt brought to ED by [facility] from [facility] for a c/o SOB and AFIB with RVR with a HR of 150. Pt normally 4L O2 home dependent with a SPO2 of 83% on EMS arrival pt placed on a NRM 15L O2 up to 95%. HR 150, BP 138/65, R 25, SPO2 95% on NRM.   Allergies Allergies  Allergen Reactions  . Onion Shortness Of Breath and Swelling  . Pollen Extract Itching  . Morphine And Related Hives    PT DENIES ALLERGY  . Niaspan [Niacin Er] Other (See Comments)    unknown  . Other     onions  . Trazodone And Nefazodone Other (See Comments)    Hallucinations  . Valium [Diazepam] Other (See Comments)    unknown  . Wellbutrin [Bupropion] Other (See Comments)    unknown    Level of Care/Admitting Diagnosis ED Disposition    ED Disposition Condition Comment   Admit  The patient appears reasonably stabilized for admission considering the current resources, flow, and capabilities available in the ED at this time, and I doubt any other [facility] requiring further screening and/or treatment in the ED prior to admission is  present.       B Medical/Surgery History Past Medical History:  Diagnosis Date  . Anemia   . CAD (coronary artery disease)   . Chronic pain   . COPD (chronic obstructive pulmonary disease) ([place])   . Depression   . Diabetes mellitus without complication ([place])   . Fall   . Hemiplegia affecting non-dominant side, post-stroke   . Hyperlipidemia   . Hypertension   . Stroke ([place]) 12/2013   left side paralysis  . Thyroid disease   . Urine retention    Past Surgical History:  Procedure Laterality Date  . ABDOMINAL HYSTERECTOMY    . APPENDECTOMY    .  CATARACT EXTRACTION Right 12/07/2016  . CHOLECYSTECTOMY    . JOINT REPLACEMENT     bil hip  . TONSILLECTOMY       A IV Location/Drains/Wounds Patient Lines/Drains/Airways Status   Active Line/Drains/Airways    Name:   Placement date:   Placement time:   Site:   Days:   Peripheral IV 03/30/2019 Right;Posterior Forearm   03/17/2019    0448    Forearm   less than 1   Peripheral IV 04/06/2019 Right Hand   03/15/2019    0449    Hand   less than 1          Intake/Output Last 24 hours  Intake/Output Summary (Last 24 hours) at 03/25/2019 6789 Last data filed at 03/30/2019 3810 Gross per 24 hour  Intake 451.46 ml  Output -  Net 451.46 ml    Labs/Imaging Results for orders placed or performed during the hospital encounter of 03/31/2019 (from the past 48 hour(s))  SARS Coronavirus 2 (CEPHEID- Performed in [place] hospital lab), Hosp Order     Status: None   Collection Time: 03/25/2019  3:54 AM   Specimen: Nasopharyngeal Swab  Result Value Ref Range   SARS Coronavirus 2 NEGATIVE NEGATIVE    Comment: (NOTE) If result is NEGATIVE SARS-CoV-2 target  nucleic acids are NOT DETECTED. The SARS-CoV-2 RNA is generally detectable in upper and lower  respiratory specimens during the acute phase of infection. The lowest  concentration of SARS-CoV-2 viral copies this assay can detect is 250  copies / mL. A negative result does not preclude SARS-CoV-2 infection  and should not be used as the sole basis for treatment or other  patient management decisions.  A negative result may occur with  improper specimen collection / handling, submission of specimen other  than nasopharyngeal swab, presence of viral mutation(s) within the  areas targeted by this assay, and inadequate number of viral copies  (<250 copies / mL). A negative result must be combined with clinical  observations, patient history, and epidemiological information. If result is POSITIVE SARS-CoV-2 target nucleic acids are DETECTED. The  SARS-CoV-2 RNA is generally detectable in upper and lower  respiratory specimens dur ing the acute phase of infection.  Positive  results are indicative of active infection with SARS-CoV-2.  Clinical  correlation with patient history and other diagnostic information is  necessary to determine patient infection status.  Positive results do  not rule out bacterial infection or co-infection with other viruses. If result is PRESUMPTIVE POSTIVE SARS-CoV-2 nucleic acids MAY BE PRESENT.   A presumptive positive result was obtained on the submitted specimen  and confirmed on repeat testing.  While 2019 novel coronavirus  (SARS-CoV-2) nucleic acids may be present in the submitted sample  additional confirmatory testing may be necessary for epidemiological  and / or clinical management purposes  to differentiate between  SARS-CoV-2 and other Sarbecovirus currently known to infect humans.  If clinically indicated additional testing with an alternate test  methodology [id number]) is advised. The SARS-CoV-2 RNA is generally  detectable in upper and lower respiratory sp ecimens during the acute  phase of infection. The expected result is Negative. Fact Sheet for Patients:  StrictlyIdeas.no Fact Sheet for Healthcare Providers: BankingDealers.co.za This test is not yet approved or cleared by the [place] FDA and has been authorized for detection and/or diagnosis of SARS-CoV-2 by FDA under an Emergency Use Authorization (EUA).  This EUA will remain in effect (meaning this test can be used) for the duration of the COVID-19 declaration under Section 564(b)(1) of the Act, 21 U.S.C. section 360bbb-3(b)(1), unless the authorization is terminated or revoked sooner. Performed at [facility], [place] [address], [place], [place] [address]   Comprehensive metabolic panel     Status: Abnormal   Collection Time: 03/29/2019  4:33 AM  Result Value Ref Range    Sodium 141 135 - 145 mmol/L   Potassium 4.8 3.5 - 5.1 mmol/L   Chloride 95 (L) 98 - 111 mmol/L   CO2 33 (H) 22 - 32 mmol/L   Glucose, Bld 148 (H) 70 - 99 mg/dL   BUN 19 8 - 23 mg/dL   Creatinine, Ser 0.57 0.44 - 1.00 mg/dL   Calcium 8.9 8.9 - 10.3 mg/dL   Total Protein 7.3 6.5 - 8.1 g/dL   Albumin 3.1 (L) 3.5 - 5.0 g/dL   AST 20 15 - 41 U/L   ALT 16 0 - 44 U/L   Alkaline Phosphatase 85 38 - 126 U/L   Total Bilirubin 0.7 0.3 - 1.2 mg/dL   GFR calc non Af Amer >60 >60 mL/min   GFR calc Af Amer >60 >60 mL/min   Anion gap 13 5 - 15    Comment: Performed at [facility], [place] [address],  [place], [place] [address]  CBC WITH DIFFERENTIAL     Status: Abnormal   Collection Time: 04/09/2019  4:33 AM  Result Value Ref Range   WBC 13.7 (H) 4.0 - 10.5 K/uL   RBC 4.14 3.87 - 5.11 MIL/uL   Hemoglobin 10.7 (L) 12.0 - 15.0 g/dL   HCT 38.5 36.0 - 46.0 %   MCV 93.0 80.0 - 100.0 fL   MCH 25.8 (L) 26.0 - 34.0 pg   MCHC 27.8 (L) 30.0 - 36.0 g/dL   RDW 18.3 (H) 11.5 - 15.5 %   Platelets 282 150 - 400 K/uL   nRBC 0.1 0.0 - 0.2 %   Neutrophils Relative % 94 %   Neutro Abs 12.7 (H) 1.7 - 7.7 K/uL   Lymphocytes Relative 3 %   Lymphs Abs 0.4 (L) 0.7 - 4.0 K/uL   Monocytes Relative 1 %   Monocytes Absolute 0.2 0.1 - 1.0 K/uL   Eosinophils Relative 0 %   Eosinophils Absolute 0.0 0.0 - 0.5 K/uL   Basophils Relative 0 %   Basophils Absolute 0.0 0.0 - 0.1 K/uL   Immature Granulocytes 2 %   Abs Immature Granulocytes 0.30 (H) 0.00 - 0.07 K/uL    Comment: Performed at [facility], [address], [place], [place] [address]   Dg Chest Portable 1 View  Result Date: 03/23/2019 CLINICAL DATA:  Shortness of breath EXAM: PORTABLE CHEST 1 VIEW COMPARISON:  None. FINDINGS: Mild cardiomegaly with streaky opacities in the right lung. Shallow lung inflation. Small right pleural effusion. IMPRESSION: Cardiomegaly with shallow lung inflation and small right pleural effusion. Streaky opacities in the right  upper lung. Electronically Signed   By: Ulyses Jarred M.D.   On: 03/14/2019 04:29    Pending Labs Unresulted Labs (From admission, onward)    Start     Ordered   03/12/2019 0407  Blood Culture (routine x 2)  BLOOD CULTURE X 2,   STAT     03/24/2019 0407   04/03/2019 0407  Urinalysis, Routine w reflex microscopic (not at [facility])  ONCE - STAT,   STAT     03/21/2019 0407   03/22/2019 0407  Urine culture  ONCE - STAT,   STAT     04/08/2019 0407          Vitals/Pain Today's Vitals   03/24/2019 0348 03/12/2019 0349 04/04/2019 0445 03/28/2019 0500  BP:   (!) 112/53 119/78  Pulse:   (!) 136   Resp:   15 10  Temp:      TempSrc:      SpO2:   93%   Weight:  64.9 kg    Height:  5\' 3"  (1.6 m)    PainSc: 8        Isolation Precautions Airborne and Contact precautions  Medications Medications  diltiazem (CARDIZEM) 1 mg/mL load via infusion 20 mg (20 mg Intravenous Bolus from Bag 03/24/2019 0622)    And  diltiazem (CARDIZEM) 100 mg in dextrose 5% 112mL (1 mg/mL) infusion (5 mg/hr Intravenous New Bag/Given 04/06/2019 0621)  azithromycin (ZITHROMAX) 500 mg in sodium chloride 0.9 % 250 mL IVPB (500 mg Intravenous New Bag/Given 03/12/2019 0603)  sodium chloride 0.9 % bolus 1,000 mL ( Intravenous Stopped 04/06/2019 0541)  cefTRIAXone (ROCEPHIN) 1 g in sodium chloride 0.9 % 100 mL IVPB (0 g Intravenous Stopped 03/12/2019 0626)    Mobility Complete care, incontinent with external catheter on High fall risk  Focused Assessments Pt SPO2 drops to low 80% even with  high flow O2 at time, providers aware, pt AO to self only, very high fall risk, pt pulls her IV multiple time during the shift, +2 edema present on upper and lower extremities, lungs sound diminish, A fib with HR up to the 150 on the monitor. Pt on Cardizem at 5 mg now, 20 mg bolus given.  R Recommendations: See Admitting Provider Note  Report given to:   Additional Notes:

## 2019-04-05 NOTE — Progress Notes (Signed)
The patient came up from the ED on a 15lpm N/C (not a HFNC). The patient was switched to the HFNC at 15lpm. The patient seems to be tolerating that well with o2 saturation at 92%. The patient does wear 4lpm N/C at home. Will continue to monitor.

## 2019-04-05 NOTE — Progress Notes (Signed)
Notified bedside nurse of need to draw lactic acid.  

## 2019-04-05 NOTE — ED Notes (Signed)
Pt requesting to leave and go back to [facility]. Pt remains 88% on high lofw HR in 150s. MD paged and notified.

## 2019-04-05 NOTE — ED Provider Notes (Addendum)
[facility] EMERGENCY DEPARTMENT Provider Note   CSN: [id number] Arrival date & time: 03/23/2019  0343    History   Chief Complaint Chief Complaint  Patient presents with  . Shortness of Breath  . Atrial Fibrillation    HPI Graciana Sessa is a 68 y.o. female.     68 yo F with a chief complaint of shortness of breath.  Going on for the past week.  Worsening today.  Eventually sent by her nursing home.  Has been having a cough and some subjective fevers at home. Feels like her heart is racing.  Denies vomiting or diarrhea.  Per EMS they found the patient on her home oxygen satting in the mid to low 80s.  Placed on nonrebreather with some improvement.  The history is provided by the patient.  Shortness of Breath Severity:  Moderate Onset quality:  Gradual Duration:  1 week Timing:  Constant Progression:  Worsening Chronicity:  New Relieved by:  Nothing Worsened by:  Nothing Ineffective treatments:  None tried Associated symptoms: cough and fever   Associated symptoms: no chest pain, no headaches, no vomiting and no wheezing   Atrial Fibrillation Associated symptoms include shortness of breath. Pertinent negatives include no chest pain and no headaches.    Past Medical History:  Diagnosis Date  . Anemia   . CAD (coronary artery disease)   . Chronic pain   . COPD (chronic obstructive pulmonary disease) ([place])   . Depression   . Diabetes mellitus without complication ([place])   . Fall   . Hemiplegia affecting non-dominant side, post-stroke   . Hyperlipidemia   . Hypertension   . Stroke ([place]) 12/2013   left side paralysis  . Thyroid disease   . Urine retention     Patient Active Problem List   Diagnosis Date Noted  . Therapeutic opioid induced constipation 09/29/2016  . Diabetes mellitus type 2 in obese ([place]) 09/29/2016  . Chronic recurrent major depressive disorder ([place]) 09/29/2016  . CHF, stage C ([place]) 04/13/2015  . Major depression, chronic  04/13/2015  . Uncontrolled diabetes mellitus ([place]) 12/14/2014  . Chronic idiopathic constipation 12/14/2014  . Hypertensive heart disease with heart failure ([place]) 12/14/2014  . Hyperlipidemia with target LDL less than 100 12/14/2014  . Abdominal distention 03/12/2014  . Anemia 02/24/2014  . RSD (reflex sympathetic dystrophy) 02/23/2014  . Edema 02/06/2014  . Insomnia 02/02/2014  . Type II diabetes mellitus with neurological manifestations, uncontrolled ([place]) 01/14/2014  . CVA (cerebral vascular accident) ([place]) 12/30/2013  . Hemiparesis affecting left side as late effect of cerebrovascular accident ([place]) 12/30/2013  . COPD (chronic obstructive pulmonary disease) ([place]) 12/30/2013  . CAD (coronary artery disease) 12/30/2013  . Hypothyroidism 12/30/2013  . Dyslipidemia 12/30/2013  . Neuropathic pain syndrome (non-herpetic) 12/30/2013  . Anxiety 12/30/2013  . Constipation 12/30/2013  . Smoker 12/30/2013  . Decreased potassium in the blood 08/06/2013  . 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 Left spastic hemiplegia ([place]) 07/22/2013    Past Surgical History:  Procedure Laterality Date  . ABDOMINAL HYSTERECTOMY    . APPENDECTOMY    . CATARACT EXTRACTION Right 12/07/2016  . CHOLECYSTECTOMY    . JOINT REPLACEMENT     bil hip  . TONSILLECTOMY       OB History   No obstetric history on file.      Home Medications    Prior to Admission medications   Medication Sig Start Date End Date Taking? Authorizing Provider  acetaminophen (TYLENOL)  325 MG tablet Take 650 mg by mouth every 4 (four) hours as needed for mild pain.    [provider]  albuterol (VENTOLIN HFA) 108 (90 Base) MCG/ACT inhaler Inhale 2 puffs into the lungs every 8 (eight) hours as needed for wheezing or shortness of breath.    [provider]  aspirin 81 MG chewable tablet Chew 81 mg by mouth daily.     [provider]  atorvastatin (LIPITOR) 10 MG tablet Take 10 mg by  mouth daily.    [provider]  baclofen (LIORESAL) 10 MG tablet Take 10 mg by mouth 3 (three) times daily. For muscle spasms 03/27/14   Gerlene Fee, NP  cyclobenzaprine (FLEXERIL) 5 MG tablet Take 5 mg by mouth 2 (two) times daily.     [provider]  diphenhydrAMINE (BENADRYL) 25 MG tablet Take 25 mg by mouth every 6 (six) hours as needed. While awake for sleep     [provider]  docusate sodium (COLACE) 100 MG capsule Take 2 capsules (200 mg total) by mouth 2 (two) times daily. 07/01/14   Gerlene Fee, NP  fluticasone (FLONASE) 50 MCG/ACT nasal spray Place 2 sprays into both nostrils daily. For chronic sinus infections    [provider]  furosemide (LASIX) 20 MG tablet Take 20 mg by mouth daily.    [provider]  gabapentin (NEURONTIN) 400 MG capsule Take 1 (400 mg) capsule by mouth three times a day with meals. Also take 3 (1200mg ) capsules by mouth daily at bedtime.    [provider]  glimepiride (AMARYL) 2 MG tablet Take 2 mg by mouth daily with breakfast.     [provider]  hydrocortisone 2.5 % lotion Apply 1 application topically 2 (two) times daily as needed.    [provider]  Insulin Glargine (TOUJEO SOLOSTAR) 300 UNIT/ML SOPN Inject 128 Units into the skin at bedtime.     [provider]  insulin lispro (HUMALOG) 100 UNIT/ML injection Inject 36 Units into the skin 3 (three) times daily before meals. For CBG >/= 150 inject 10 additional units subcutaneously    [provider]  isosorbide mononitrate (IMDUR) 30 MG 24 hr tablet Take 30 mg by mouth daily.     [provider]  ketorolac (ACULAR) 0.4 % SOLN Apply 1 drop to eye 4 (four) times daily. Right eye    [provider]  levothyroxine (SYNTHROID, LEVOTHROID) 25 MCG tablet Take 25 mcg by mouth daily before breakfast.     [provider]  Linaclotide (LINZESS) 145 MCG CAPS capsule Take 145 mcg by mouth  daily. Give 30 minutes prior to first meal and taken on an empty stomach.    [provider]  linagliptin (TRADJENTA) 5 MG TABS tablet Take 5 mg by mouth daily.     [provider]  Melatonin 3 MG TABS Take 6 mg by mouth at bedtime.     [provider]  Menthol, Topical Analgesic, (BIOFREEZE) 4 % GEL Apply topically 4 (four) times daily. Apply to both feet.    [provider]  metFORMIN (GLUCOPHAGE) 1000 MG tablet Take 1,000 mg by mouth 2 (two) times daily with a meal.     [provider]  moxifloxacin (VIGAMOX) 0.5 % ophthalmic solution Place 1 drop into the left eye 4 (four) times daily. While awake     [provider]  nitroGLYCERIN (NITROSTAT) 0.4 MG SL tablet Place 0.4 mg under the tongue every  5 (five) minutes as needed for chest pain. Notify MD if not relieved by NTG    [provider]  omeprazole (PRILOSEC) 20 MG capsule Take 20 mg by mouth daily.     [provider]  Oxycodone HCl 10 MG TABS Take 10 mg by mouth. Daily at 12 am, 8 am, 12 pm, 4 pm, and 8 pm,    [provider]  OXYGEN Inhale 2 L into the lungs as needed (for O2 sats 88% or lower).     [provider]  Polyethyl Glycol-Propyl Glycol (SYSTANE ULTRA OP) Place 1 drop into both eyes at bedtime as needed. Wait 3 to 5 minutes between 2 eye meds    [provider]  prednisoLONE acetate (PRED FORTE) 1 % ophthalmic suspension Place 1 drop into the left eye as directed. 1 drop QID x1 week; then TID x1 week; then BID x1 week; then QD x1 week; then stop 12/29/16   [provider]  promethazine (PHENERGAN) 12.5 MG tablet Take 12.5 mg by mouth every 6 (six) hours as needed for nausea or vomiting.    [provider]  sertraline (ZOLOFT) 100 MG tablet Take 100 mg by mouth daily. Take by mouth daily  along with 50 mg tablet to equal 150 mg total.    [provider]  sertraline (ZOLOFT) 50 MG tablet Take 50 mg by mouth  daily. Take along with 100mg  to make 150mg  total    [provider]  spironolactone (ALDACTONE) 25 MG tablet Take 25 mg by mouth 2 (two) times daily. For edema    [provider]  tiotropium (SPIRIVA) 18 MCG inhalation capsule Inhale 1 capsule (18 MCG CP Handihaler)  contents by taking two separate inhalations via handihaler device daily for COPD    [provider]    Family History Family History  Problem Relation Age of Onset  . Diabetes Mother     Social History Social History   Tobacco Use  . Smoking status: Former Smoker    Packs/day: 4.00    Years: 40.00    Pack years: 160.00    Types: Cigarettes  . Smokeless tobacco: Never Used  Substance Use Topics  . Alcohol use: No  . Drug use: No     Allergies   Onion, Pollen extract, Morphine and related, Niaspan [niacin er], Other, Trazodone and nefazodone, Valium [diazepam], and Wellbutrin [bupropion]   Review of Systems Review of Systems  Constitutional: Positive for fever. Negative for chills.  HENT: Negative for congestion and rhinorrhea.   Eyes: Negative for redness and visual disturbance.  Respiratory: Positive for cough and shortness of breath. Negative for wheezing.   Cardiovascular: Negative for chest pain and palpitations.  Gastrointestinal: Negative for nausea and vomiting.  Genitourinary: Negative for dysuria and urgency.  Musculoskeletal: Negative for arthralgias and myalgias.  Skin: Negative for pallor and wound.  Neurological: Negative for dizziness and headaches.     Physical Exam Updated Vital Signs BP 119/78   Pulse (!) 136   Temp 98.3 F (36.8 C) (Oral)   Resp 10   Ht 5\' 3"  (1.6 m)   Wt 64.9 kg   SpO2 93%   BMI 25.33 kg/m   Physical Exam Vitals signs and nursing note reviewed.  Constitutional:      General: She is not in acute distress.    Appearance: She is well-developed. She is not diaphoretic.  HENT:     Head: Normocephalic and atraumatic.  Eyes:  Pupils: Pupils are equal, round, and reactive to light.  Neck:     Musculoskeletal: Normal range of motion and neck supple.  Cardiovascular:     Rate and Rhythm: Normal rate and regular rhythm.     Heart sounds: No murmur. No friction rub. No gallop.   Pulmonary:     Effort: Pulmonary effort is normal. Tachypnea present.     Breath sounds: Rhonchi (diffuse) present. No wheezing or rales.  Abdominal:     General: There is no distension.     Palpations: Abdomen is soft.     Tenderness: There is no abdominal tenderness.  Musculoskeletal:        General: No tenderness.  Skin:    General: Skin is warm and dry.  Neurological:     Mental Status: She is alert and oriented to person, place, and time.  Psychiatric:        Behavior: Behavior normal.      ED Treatments / Results  Labs (all labs ordered are listed, but only abnormal results are displayed) Labs Reviewed  COMPREHENSIVE METABOLIC PANEL - Abnormal; Notable for the following components:      Result Value   Chloride 95 (*)    CO2 33 (*)    Glucose, Bld 148 (*)    Albumin 3.1 (*)    All other components within normal limits  CBC WITH DIFFERENTIAL/PLATELET - Abnormal; Notable for the following components:   WBC 13.7 (*)    Hemoglobin 10.7 (*)    MCH 25.8 (*)    MCHC 27.8 (*)    RDW 18.3 (*)    Neutro Abs 12.7 (*)    Lymphs Abs 0.4 (*)    Abs Immature Granulocytes 0.30 (*)    All other components within normal limits  SARS CORONAVIRUS 2 (HOSPITAL ORDER, [facility])  CULTURE, BLOOD (ROUTINE X 2)  CULTURE, BLOOD (ROUTINE X 2)  URINE CULTURE  URINALYSIS, ROUTINE W REFLEX MICROSCOPIC    EKG EKG Interpretation  Date/Time:  Saturday April 05 2019 04:01:00 EDT Ventricular Rate:  155 PR Interval:    QRS Duration: 73 QT Interval:  285 QTC Calculation: 458 R Axis:   66 Text Interpretation:  Atrial fibrillation with rapid V-rate Borderline low voltage, extremity leads Repolarization  abnormality, prob rate related Otherwise no significant change Confirmed by Deno Etienne [phone]) on 03/19/2019 4:06:09 AM   Radiology Dg Chest Portable 1 View  Result Date: 03/28/2019 CLINICAL DATA:  Shortness of breath EXAM: PORTABLE CHEST 1 VIEW COMPARISON:  None. FINDINGS: Mild cardiomegaly with streaky opacities in the right lung. Shallow lung inflation. Small right pleural effusion. IMPRESSION: Cardiomegaly with shallow lung inflation and small right pleural effusion. Streaky opacities in the right upper lung. Electronically Signed   By: Ulyses Jarred M.D.   On: 04/08/2019 04:29    Procedures Procedures (including critical care time)  Medications Ordered in ED Medications  diltiazem (CARDIZEM) 1 mg/mL load via infusion 20 mg (20 mg Intravenous Bolus from Bag 03/26/2019 0622)    And  diltiazem (CARDIZEM) 100 mg in dextrose 5% 151mL (1 mg/mL) infusion (5 mg/hr Intravenous New Bag/Given 04/09/2019 0621)  azithromycin (ZITHROMAX) 500 mg in sodium chloride 0.9 % 250 mL IVPB (500 mg Intravenous New Bag/Given 03/13/2019 0603)  sodium chloride 0.9 % bolus 1,000 mL (1,000 mLs Intravenous New Bag/Given 04/03/2019 0507)  cefTRIAXone (ROCEPHIN) 1 g in sodium chloride 0.9 % 100 mL IVPB (0 g Intravenous Stopped 03/12/2019 0626)     Initial Impression /  Assessment and Plan / ED Course  I have reviewed the triage vital signs and the nursing notes.  Pertinent labs & imaging results that were available during my care of the patient were reviewed by me and considered in my medical decision making (see chart for details).        67 yo F with a chief complaint of shortness of breath.  Going on for the past week with fever and cough.  Chest x-ray viewed by me with opacities in the right lung.  With fever and cough is consistent with pneumonia.  Will start on antibiotics.  Patient doesn't like her face mask.  Making her agitated, pulling out iv's.  Discussed with patient who would prefer [place].    Covid test back  negative.  Discussed with respiratory who will place the patient on high flow nasal cannula.  Titrated to 6 L with good oxygen saturation.  DNR.  Hospitalist to admit.   CRITICAL CARE Performed by: Cecilio Asper   Total critical care time: 35 minutes  Critical care time was exclusive of separately billable procedures and treating other patients.  Critical care was necessary to treat or prevent imminent or life-threatening deterioration.  Critical care was time spent personally by me on the following activities: development of treatment plan with patient and/or surrogate as well as nursing, discussions with consultants, evaluation of patient's response to treatment, examination of patient, obtaining history from patient or surrogate, ordering and performing treatments and interventions, ordering and review of laboratory studies, ordering and review of radiographic studies, pulse oximetry and re-evaluation of patient's condition.   The patients results and plan were reviewed and discussed.   Any x-rays performed were independently reviewed by myself.   Differential diagnosis were considered with the presenting HPI.  Medications  diltiazem (CARDIZEM) 1 mg/mL load via infusion 20 mg (20 mg Intravenous Bolus from Bag 03/29/2019 0622)    And  diltiazem (CARDIZEM) 100 mg in dextrose 5% 178mL (1 mg/mL) infusion (5 mg/hr Intravenous New Bag/Given 03/20/2019 0621)  azithromycin (ZITHROMAX) 500 mg in sodium chloride 0.9 % 250 mL IVPB (500 mg Intravenous New Bag/Given 04/07/2019 0603)  sodium chloride 0.9 % bolus 1,000 mL (1,000 mLs Intravenous New Bag/Given 04/03/2019 0507)  cefTRIAXone (ROCEPHIN) 1 g in sodium chloride 0.9 % 100 mL IVPB (0 g Intravenous Stopped 03/30/2019 0626)    Vitals:   04/07/2019 0347 03/15/2019 0349 03/22/2019 0445 04/04/2019 0500  BP:   (!) 112/53 119/78  Pulse: (!) 138  (!) 136   Resp: (!) 22  15 10   Temp: 98.3 F (36.8 C)     TempSrc: Oral     SpO2: 94%  93%   Weight:  64.9  kg    Height:  5\' 3"  (1.6 m)      Final diagnoses:  Community acquired pneumonia of right upper lobe of lung ([place])    Admission/ observation were discussed with the admitting physician, patient and/or family and they are comfortable with the plan.    Final Clinical Impressions(s) / ED Diagnoses   Final diagnoses:  Community acquired pneumonia of right upper lobe of lung [facility])    ED Discharge Orders    None       Deno Etienne, DO 03/20/2019 [place], [place], DO 04/14/19 [phone]

## 2019-04-05 NOTE — ED Notes (Signed)
ED TO INPATIENT HANDOFF REPORT  ED Nurse Name and Phone #:   S Name/Age/Gender Susan [name] 68 y.o. female Room/Bed: 039C/039C  Code Status   Code Status: Prior  Home/SNF/Other Skilled nursing facility Patient oriented to: self Is this baseline? Yes   Triage Complete: Triage complete  Chief Complaint SOB  Triage Note Pt brought to ED by [facility] from [facility] for a c/o SOB and AFIB with RVR with a HR of 150. Pt normally 4L O2 home dependent with a SPO2 of 83% on EMS arrival pt placed on a NRM 15L O2 up to 95%. HR 150, BP 138/65, R 25, SPO2 95% on NRM.   Allergies Allergies  Allergen Reactions  . Onion Shortness Of Breath and Swelling  . Pollen Extract Itching  . Morphine And Related Hives    PT DENIES ALLERGY  . Niaspan [Niacin Er] Other (See Comments)    unknown  . Other     onions  . Trazodone And Nefazodone Other (See Comments)    Hallucinations  . Valium [Diazepam] Other (See Comments)    unknown  . Wellbutrin [Bupropion] Other (See Comments)    unknown    Level of Care/Admitting Diagnosis ED Disposition    ED Disposition Condition Comment   Admit  Hospital Area: [facility] [[id number]]  Level of Care: Progressive [102]  Covid Evaluation: Confirmed COVID Negative  Diagnosis: Atrial fibrillation with RVR [facility]) [[id number]]  Admitting Physician: Norval Morton [[id number]]  Attending Physician: Norval Morton [[id number]]  Estimated length of stay: past midnight tomorrow  Certification:: I certify this patient will need inpatient services for at least 2 midnights  PT Class (Do Not Modify): Inpatient [101]  PT Acc Code (Do Not Modify): Private [1]       B Medical/Surgery History Past Medical History:  Diagnosis Date  . Anemia   . CAD (coronary artery disease)   . Chronic pain   . COPD (chronic obstructive pulmonary disease) ([place])   . Depression   . Diabetes mellitus without complication ([place])   . Fall   . Hemiplegia  affecting non-dominant side, post-stroke   . Hyperlipidemia   . Hypertension   . Stroke ([place]) 12/2013   left side paralysis  . Thyroid disease   . Urine retention    Past Surgical History:  Procedure Laterality Date  . ABDOMINAL HYSTERECTOMY    . APPENDECTOMY    . CATARACT EXTRACTION Right 12/07/2016  . CHOLECYSTECTOMY    . JOINT REPLACEMENT     bil hip  . TONSILLECTOMY       A IV Location/Drains/Wounds Patient Lines/Drains/Airways Status   Active Line/Drains/Airways    Name:   Placement date:   Placement time:   Site:   Days:   Peripheral IV 04/08/2019 Right;Posterior Forearm   03/28/2019    0448    Forearm   less than 1   Peripheral IV 03/19/2019 Right Hand   04/08/2019    0449    Hand   less than 1          Intake/Output Last 24 hours  Intake/Output Summary (Last 24 hours) at 04/06/2019 2671 Last data filed at 04/01/2019 0703 Gross per 24 hour  Intake 641.59 ml  Output -  Net 641.59 ml    Labs/Imaging Results for orders placed or performed during the hospital encounter of 03/14/2019 (from the past 48 hour(s))  SARS Coronavirus 2 (CEPHEID- Performed in [place] hospital lab), [facility]  Status: None   Collection Time: 03/25/2019  3:54 AM   Specimen: Nasopharyngeal Swab  Result Value Ref Range   SARS Coronavirus 2 NEGATIVE NEGATIVE    Comment: (NOTE) If result is NEGATIVE SARS-CoV-2 target nucleic acids are NOT DETECTED. The SARS-CoV-2 RNA is generally detectable in upper and lower  respiratory specimens during the acute phase of infection. The lowest  concentration of SARS-CoV-2 viral copies this assay can detect is 250  copies / mL. A negative result does not preclude SARS-CoV-2 infection  and should not be used as the sole basis for treatment or other  patient management decisions.  A negative result may occur with  improper specimen collection / handling, submission of specimen other  than nasopharyngeal swab, presence of viral mutation(s) within the  areas  targeted by this assay, and inadequate number of viral copies  (<250 copies / mL). A negative result must be combined with clinical  observations, patient history, and epidemiological information. If result is POSITIVE SARS-CoV-2 target nucleic acids are DETECTED. The SARS-CoV-2 RNA is generally detectable in upper and lower  respiratory specimens dur ing the acute phase of infection.  Positive  results are indicative of active infection with SARS-CoV-2.  Clinical  correlation with patient history and other diagnostic information is  necessary to determine patient infection status.  Positive results do  not rule out bacterial infection or co-infection with other viruses. If result is PRESUMPTIVE POSTIVE SARS-CoV-2 nucleic acids MAY BE PRESENT.   A presumptive positive result was obtained on the submitted specimen  and confirmed on repeat testing.  While 2019 novel coronavirus  (SARS-CoV-2) nucleic acids may be present in the submitted sample  additional confirmatory testing may be necessary for epidemiological  and / or clinical management purposes  to differentiate between  SARS-CoV-2 and other Sarbecovirus currently known to infect humans.  If clinically indicated additional testing with an alternate test  methodology [phone]) is advised. The SARS-CoV-2 RNA is generally  detectable in upper and lower respiratory sp ecimens during the acute  phase of infection. The expected result is Negative. Fact Sheet for Patients:  StrictlyIdeas.no Fact Sheet for Healthcare Providers: BankingDealers.co.za This test is not yet approved or cleared by the [place] FDA and has been authorized for detection and/or diagnosis of SARS-CoV-2 by FDA under an Emergency Use Authorization (EUA).  This EUA will remain in effect (meaning this test can be used) for the duration of the COVID-19 declaration under Section 564(b)(1) of the Act, 21 U.S.C. section  360bbb-3(b)(1), unless the authorization is terminated or revoked sooner. Performed at [facility], [place] [address], [place], [place] [address]   Comprehensive metabolic panel     Status: Abnormal   Collection Time: 03/18/2019  4:33 AM  Result Value Ref Range   Sodium 141 135 - 145 mmol/L   Potassium 4.8 3.5 - 5.1 mmol/L   Chloride 95 (L) 98 - 111 mmol/L   CO2 33 (H) 22 - 32 mmol/L   Glucose, Bld 148 (H) 70 - 99 mg/dL   BUN 19 8 - 23 mg/dL   Creatinine, Ser 0.57 0.44 - 1.00 mg/dL   Calcium 8.9 8.9 - 10.3 mg/dL   Total Protein 7.3 6.5 - 8.1 g/dL   Albumin 3.1 (L) 3.5 - 5.0 g/dL   AST 20 15 - 41 U/L   ALT 16 0 - 44 U/L   Alkaline Phosphatase 85 38 - 126 U/L   Total Bilirubin 0.7 0.3 - 1.2 mg/dL   GFR  calc non Af Amer >60 >60 mL/min   GFR calc Af Amer >60 >60 mL/min   Anion gap 13 5 - 15    Comment: Performed at [facility] [address], [place], [place] [address]  CBC WITH DIFFERENTIAL     Status: Abnormal   Collection Time: 03/25/2019  4:33 AM  Result Value Ref Range   WBC 13.7 (H) 4.0 - 10.5 K/uL   RBC 4.14 3.87 - 5.11 MIL/uL   Hemoglobin 10.7 (L) 12.0 - 15.0 g/dL   HCT 38.5 36.0 - 46.0 %   MCV 93.0 80.0 - 100.0 fL   MCH 25.8 (L) 26.0 - 34.0 pg   MCHC 27.8 (L) 30.0 - 36.0 g/dL   RDW 18.3 (H) 11.5 - 15.5 %   Platelets 282 150 - 400 K/uL   nRBC 0.1 0.0 - 0.2 %   Neutrophils Relative % 94 %   Neutro Abs 12.7 (H) 1.7 - 7.7 K/uL   Lymphocytes Relative 3 %   Lymphs Abs 0.4 (L) 0.7 - 4.0 K/uL   Monocytes Relative 1 %   Monocytes Absolute 0.2 0.1 - 1.0 K/uL   Eosinophils Relative 0 %   Eosinophils Absolute 0.0 0.0 - 0.5 K/uL   Basophils Relative 0 %   Basophils Absolute 0.0 0.0 - 0.1 K/uL   Immature Granulocytes 2 %   Abs Immature Granulocytes 0.30 (H) 0.00 - 0.07 K/uL    Comment: Performed at [facility], [address] [place], [place] [address]  Urinalysis, Routine w reflex microscopic (not at [facility])     Status: Abnormal   Collection Time: 04/01/2019   6:30 AM  Result Value Ref Range   Color, Urine AMBER (A) YELLOW    Comment: BIOCHEMICALS MAY BE AFFECTED BY COLOR   APPearance CLOUDY (A) CLEAR   Specific Gravity, Urine 1.039 (H) 1.005 - 1.030   pH 5.0 5.0 - 8.0   Glucose, UA 50 (A) NEGATIVE mg/dL   Hgb urine dipstick NEGATIVE NEGATIVE   Bilirubin Urine NEGATIVE NEGATIVE   Ketones, ur 20 (A) NEGATIVE mg/dL   Protein, ur >=300 (A) NEGATIVE mg/dL   Nitrite NEGATIVE NEGATIVE   Leukocytes,Ua TRACE (A) NEGATIVE   RBC / HPF 6-10 0 - 5 RBC/hpf   WBC, UA 11-20 0 - 5 WBC/hpf   Bacteria, UA RARE (A) NONE SEEN   Squamous Epithelial / LPF 6-10 0 - 5    Comment: Performed at [facility], [address], [place], [place] [address]   Dg Chest Portable 1 View  Result Date: 03/20/2019 CLINICAL DATA:  Shortness of breath EXAM: PORTABLE CHEST 1 VIEW COMPARISON:  None. FINDINGS: Mild cardiomegaly with streaky opacities in the right lung. Shallow lung inflation. Small right pleural effusion. IMPRESSION: Cardiomegaly with shallow lung inflation and small right pleural effusion. Streaky opacities in the right upper lung. Electronically Signed   By: Ulyses Jarred M.D.   On: 03/17/2019 04:29    Pending Labs Unresulted Labs (From admission, onward)    Start     Ordered   03/18/2019 0850  Brain natriuretic peptide  Add-on,   AD     04/07/2019 0849   03/20/2019 0407  Blood Culture (routine x 2)  BLOOD CULTURE X 2,   STAT     03/19/2019 0407   03/26/2019 0407  Urine culture  ONCE - STAT,   STAT     03/24/2019 0407          Vitals/Pain Today's Vitals  03/21/2019 0745 03/20/2019 0800 03/20/2019 0815 03/24/2019 0830  BP: 117/71 110/61 134/83 (!) 127/110  Pulse: (!) 145  (!) 135   Resp: 13 12 20 20   Temp:      TempSrc:      SpO2: 95%  (!) 86%   Weight:      Height:      PainSc:        Isolation Precautions No active isolations  Medications Medications  diltiazem (CARDIZEM) 1 mg/mL load via infusion 20 mg (20 mg Intravenous Bolus from Bag 03/29/2019 0622)     And  diltiazem (CARDIZEM) 100 mg in dextrose 5% 147mL (1 mg/mL) infusion (10 mg/hr Intravenous Rate/Dose Change 03/31/2019 0813)  fentaNYL (SUBLIMAZE) injection 12.5 mcg (has no administration in time range)  sodium chloride 0.9 % bolus 1,000 mL ( Intravenous Stopped 03/31/2019 0541)  cefTRIAXone (ROCEPHIN) 1 g in sodium chloride 0.9 % 100 mL IVPB (0 g Intravenous Stopped 03/22/2019 0626)  azithromycin (ZITHROMAX) 500 mg in sodium chloride 0.9 % 250 mL IVPB (0 mg Intravenous Stopped 03/20/2019 0703)    Mobility non-ambulatory Low fall risk   Focused Assessments    R Recommendations: See Admitting Provider Note  Report given to:   Additional Notes:

## 2019-04-05 NOTE — ED Triage Notes (Signed)
Pt brought to ED by [facility] from [facility] for a c/o SOB and AFIB with RVR with a HR of 150. Pt normally 4L O2 home dependent with a SPO2 of 83% on EMS arrival pt placed on a NRM 15L O2 up to 95%. HR 150, BP 138/65, R 25, SPO2 95% on NRM.

## 2019-04-05 NOTE — ED Notes (Signed)
Update attempted to pt's husband no respond gotten to the phone call.

## 2019-04-05 NOTE — Consult Note (Signed)
Cardiology Consultation:   Patient ID: Susan [name] MRN: [id number]; DOB: 1950/10/23  Admit date: 03/29/2019 Date of Consult: 03/28/2019  Primary Care Provider: Lattie Corns, PA-C Primary Cardiologist:   New    Patient Profile:   Susan [name] is a 68 y.o. female with a hx of CAD who is being seen today for the evaluation of atrial fibrillation  at the request of Dr Tamala Julian    History of Present Illness:   [name] a 68 yo with DM (dx 2012), neuropathy, CVA, HTN and CAD   She says she was followed [facility] at [facility] and [facility]   Has had 5 stents in the past   Susan [name] at [facility] placed  It appears that she was last seen there in 2002    She was admitted today because  Os SOB over past week   WOrse today   Has cough  SOme feverish feeling   Palpitations    Pt not a good historian because SOB   Coughing  On high flow [place]  Heart Pathway Score:     Past Medical History:  Diagnosis Date  . Anemia   . CAD (coronary artery disease)   . Chronic pain   . COPD (chronic obstructive pulmonary disease) ([place])   . Depression   . Diabetes mellitus without complication ([place])   . Fall   . Hemiplegia affecting non-dominant side, post-stroke   . Hyperlipidemia   . Hypertension   . Stroke ([place]) 12/2013   left side paralysis  . Thyroid disease   . Urine retention     Past Surgical History:  Procedure Laterality Date  . ABDOMINAL HYSTERECTOMY    . APPENDECTOMY    . CATARACT EXTRACTION Right 12/07/2016  . CHOLECYSTECTOMY    . JOINT REPLACEMENT     bil hip  . TONSILLECTOMY         Inpatient Medications: Scheduled Meds: . atorvastatin  10 mg Oral Daily  . enoxaparin (LOVENOX) injection  60 mg Subcutaneous Q12H  . furosemide  20 mg Intravenous STAT  . guaiFENesin  600 mg Oral BID  . insulin aspart  0-20 Units Subcutaneous TID WC  . insulin aspart  0-5 Units Subcutaneous QHS  . [START ON 04/06/2019] levothyroxine  25 mcg Oral QAC breakfast  . Oxycodone HCl  10 mg Oral See admin instructions   . pantoprazole  40 mg Oral Daily  . sertraline  100 mg Oral Daily  . sertraline  50 mg Oral Daily  . sodium chloride flush  3 mL Intravenous Q12H   Continuous Infusions: . [START ON 04/06/2019] azithromycin    . [START ON 04/06/2019] cefTRIAXone (ROCEPHIN)  IV    . diltiazem (CARDIZEM) infusion 10 mg/hr (03/27/2019 0813)   PRN Meds: acetaminophen **OR** acetaminophen, fentaNYL (SUBLIMAZE) injection, levalbuterol, ondansetron **OR** ondansetron (ZOFRAN) IV  Allergies:    Allergies  Allergen Reactions  . Onion Shortness Of Breath and Swelling  . Pollen Extract Itching  . Morphine And Related Hives    PT DENIES ALLERGY  . Niaspan [Niacin Er] Other (See Comments)    unknown  . Other     onions  . Trazodone And Nefazodone Other (See Comments)    Hallucinations  . Valium [Diazepam] Other (See Comments)    unknown  . Wellbutrin [Bupropion] Other (See Comments)    unknown    Social History:   Social History   Socioeconomic History  . Marital status: Married    Spouse name: Not on file  .  Number of children: Not on file  . Years of education: Not on file  . Highest education level: Not on file  Occupational History  . Not on file  Social Needs  . Financial resource strain: Not on file  . Food insecurity    Worry: Not on file    Inability: Not on file  . Transportation needs    Medical: Not on file    Non-medical: Not on file  Tobacco Use  . Smoking status: Former Smoker    Packs/day: 4.00    Years: 40.00    Pack years: 160.00    Types: Cigarettes  . Smokeless tobacco: Never Used  Substance and Sexual Activity  . Alcohol use: No  . Drug use: No  . Sexual activity: Not Currently  Lifestyle  . Physical activity    Days per week: Not on file    Minutes per session: Not on file  . Stress: Not on file  Relationships  . Social Herbalist on phone: Not on file    Gets together: Not on file    Attends religious service: Not on file    Active member of club  or organization: Not on file    Attends meetings of clubs or organizations: Not on file    Relationship status: Not on file  . Intimate partner violence    Fear of current or ex partner: Not on file    Emotionally abused: Not on file    Physically abused: Not on file    Forced sexual activity: Not on file  Other Topics Concern  . Not on file  Social History Narrative  . Not on file    Family History:    Family History  Problem Relation Age of Onset  . Diabetes Mother      ROS:  Please see the history of present illness.   All other ROS reviewed and negative.     Physical Exam/Data:   Vitals:   04/09/2019 0745 03/24/2019 0800 03/23/2019 0815 04/09/2019 0830  BP: 117/71 110/61 134/83 (!) 127/110  Pulse: (!) 145  (!) 135   Resp: 13 12 20 20   Temp:      TempSrc:      SpO2: 95%  (!) 86%   Weight:      Height:        Intake/Output Summary (Last 24 hours) at 04/08/2019 1015 Last data filed at 04/02/2019 0703 Gross per 24 hour  Intake 641.59 ml  Output -  Net 641.59 ml   Last 3 Weights 03/27/2019 01/25/2017 01/19/2017  Weight (lbs) 143 lb 194 lb 194 lb 6.4 oz  Weight (kg) 64.864 kg 87.998 kg 88.179 kg     General: Morbidly obese 68 yo in some distress due to SOB   HEENT: normal Lymph: no adenopathy Neck: Neck is full  No bruits   Vascular: No carotid bruits; FA pulses 2+ bilaterally without bruits  Cardiac:  Irreg irreg   S1, S2  No S3  No definite murmurs   Lungs:  Rhonchi  Bilaterally  Moving air   Abd: soft, Mild diffuse tendereness   Ext  Tr edema   Musculoskeletal: L leg with deformity    Skin: warm and dry  Neuro:  CNs 2-12 intact, paralyzed L side  Psych:  Agitated    EKG:  The EKG was personally reviewed and demonstrates:  Atrial fib with RVR 155 bpm   Telemetry:  Telemetry was personally reviewed and demonstrates:  Afib  120  Relevant CV Studies: Echo ordered    Laboratory Data:  High Sensitivity Troponin:  No results for input(s): TROPONINIHS in the last  720 hours.   Cardiac EnzymesNo results for input(s): TROPONINI in the last 168 hours. No results for input(s): TROPIPOC in the last 168 hours.  Chemistry Recent Labs  Lab 03/13/2019 0433  NA 141  K 4.8  CL 95*  CO2 33*  GLUCOSE 148*  BUN 19  CREATININE 0.57  CALCIUM 8.9  GFRNONAA >60  GFRAA >60  ANIONGAP 13    Recent Labs  Lab 04/11/2019 0433  PROT 7.3  ALBUMIN 3.1*  AST 20  ALT 16  ALKPHOS 85  BILITOT 0.7   Hematology Recent Labs  Lab 04/08/2019 0433  WBC 13.7*  RBC 4.14  HGB 10.7*  HCT 38.5  MCV 93.0  MCH 25.8*  MCHC 27.8*  RDW 18.3*  PLT 282   BNP Recent Labs  Lab 04/11/2019 0433  BNP 501.2*    DDimer No results for input(s): DDIMER in the last 168 hours.   Radiology/Studies:  Dg Chest Portable 1 View  Result Date: 04/01/2019 CLINICAL DATA:  Shortness of breath EXAM: PORTABLE CHEST 1 VIEW COMPARISON:  None. FINDINGS: Mild cardiomegaly with streaky opacities in the right lung. Shallow lung inflation. Small right pleural effusion. IMPRESSION: Cardiomegaly with shallow lung inflation and small right pleural effusion. Streaky opacities in the right upper lung. Electronically Signed   By: Ulyses Jarred M.D.   On: 04/01/2019 04:29    Assessment and Plan:   1  Atrial fibrillation   Pt in afib with RVR   Would try low dose IV lopressor for rate control  Duration of unknown   CHADSVASc of 8       Heparinize    Echo ordered     2  Hx of CAD  Pt reports stent x 5   Se[facility] remotely (2002) at [facility]  No records available Echo is pending   Pt with chest pressure and SOB   Probably related to HR as well as pulmonary   Would give 80 lasix and follow response    3  HTN  Hx of   Follow as treat above    4  HL   Continue lipitor   5  Pulmonary   On broad ABX   CXR as noted above   COVID is neg but concerning given clnical presentation     WIl lfollow  PT is critidally ill.    For questions or updates, please contact [place] Please consult [email or web address] for  contact info under     Signed, Dorris Carnes, MD  03/26/2019 10:15 AM

## 2019-04-05 NOTE — H&P (Addendum)
History and Physical    Susan [name] ZGY:[id number] DOB: 09-09-1951 DOA: 04/11/2019  Referring MD/NP/PA: Jennette Kettle, MD PCP: Lattie Corns, PA-C  [facility] coming [facility]: [facility]  Chief Complaint: Shortness of breath  I have personally briefly reviewed patient's old medical records in [place]   HPI: Susan [name] is a 68 y.o. female with medical history significant of HTN, HLD, CAD s/p stents, COPD on 4 to 6 L, CVA with residual weakness, hypothyroidism, and depression; who presents with complaints of shortness of breath over the last week.  She denies any previous history of having an irregular heartbeat and initially states that she wanted to go home.  Associated symptoms of productive cough, subjective fevers, palpitations, and generalized malaise.  Denies having any active chest pain, nausea, vomiting, diaphoresis, or leg pain.  Records show patient had recently been started on Levaquin earlier this week.  EMS found the patient with O2 saturations into the 80s which she was placed on a nonrebreather.  ED Course: On admission to the emergency department patient was noted to be afebrile, pulse up to 150s in atrial fibrillation with RVR, blood pressures 101/58-134/83, and O2 saturations as low as 84% with improvement to upper 80s to low 90s on 15 L flow oxygen.  Labs revealed WBC 13.7 and hemoglobin 10.7.  Chest x-ray showing cardiomegaly with shallow lung inflation and small right pleural effusion.  Patient was started on Cardizem drip.  TRH called to admit.  Review of Systems  Constitutional: Positive for fever and malaise/fatigue. Negative for chills.  HENT: Negative for congestion and ear discharge.   Eyes: Negative for photophobia and pain.  Respiratory: Positive for shortness of breath.   Cardiovascular: Positive for chest pain and palpitations.  Gastrointestinal: Negative for abdominal pain, nausea and vomiting.  Genitourinary: Negative for dysuria and frequency.   Musculoskeletal: Positive for back pain and myalgias.  Skin: Negative for rash.  Neurological: Positive for sensory change.  Psychiatric/Behavioral: Negative for memory loss and substance abuse.  All other systems reviewed and are negative.   Past Medical History:  Diagnosis Date  . Anemia   . CAD (coronary artery disease)   . Chronic pain   . COPD (chronic obstructive pulmonary disease) ([place])   . Depression   . Diabetes mellitus without complication ([place])   . Fall   . Hemiplegia affecting non-dominant side, post-stroke   . Hyperlipidemia   . Hypertension   . Stroke ([place]) 12/2013   left side paralysis  . Thyroid disease   . Urine retention     Past Surgical History:  Procedure Laterality Date  . ABDOMINAL HYSTERECTOMY    . APPENDECTOMY    . CATARACT EXTRACTION Right 12/07/2016  . CHOLECYSTECTOMY    . JOINT REPLACEMENT     bil hip  . TONSILLECTOMY       reports that she has quit smoking. Her smoking use included cigarettes. She has a 160.00 pack-year smoking history. She has never used smokeless tobacco. She reports that she does not drink alcohol or use drugs.  Allergies  Allergen Reactions  . Onion Shortness Of Breath and Swelling  . Pollen Extract Itching  . Morphine And Related Hives    PT DENIES ALLERGY  . Niaspan [Niacin Er] Other (See Comments)    unknown  . Other     onions  . Trazodone And Nefazodone Other (See Comments)    Hallucinations  . Valium [Diazepam] Other (See Comments)    unknown  . Wellbutrin [  Bupropion] Other (See Comments)    unknown    Family History  Problem Relation Age of Onset  . Diabetes Mother     Prior to Admission medications   Medication Sig Start Date End Date Taking? Authorizing Provider  acetaminophen (TYLENOL) 325 MG tablet Take 650 mg by mouth every 4 (four) hours as needed for mild pain.    [provider]  albuterol (VENTOLIN HFA) 108 (90 Base) MCG/ACT inhaler Inhale 2 puffs into the lungs every 8 (eight)  hours as needed for wheezing or shortness of breath.    [provider]  aspirin 81 MG chewable tablet Chew 81 mg by mouth daily.     [provider]  atorvastatin (LIPITOR) 10 MG tablet Take 10 mg by mouth daily.    [provider]  baclofen (LIORESAL) 10 MG tablet Take 10 mg by mouth 3 (three) times daily. For muscle spasms 03/27/14   Gerlene Fee, NP  cyclobenzaprine (FLEXERIL) 5 MG tablet Take 5 mg by mouth 2 (two) times daily.     [provider]  diphenhydrAMINE (BENADRYL) 25 MG tablet Take 25 mg by mouth every 6 (six) hours as needed. While awake for sleep     [provider]  docusate sodium (COLACE) 100 MG capsule Take 2 capsules (200 mg total) by mouth 2 (two) times daily. 07/01/14   Gerlene Fee, NP  fluticasone (FLONASE) 50 MCG/ACT nasal spray Place 2 sprays into both nostrils daily. For chronic sinus infections    [provider]  furosemide (LASIX) 20 MG tablet Take 20 mg by mouth daily.    [provider]  gabapentin (NEURONTIN) 400 MG capsule Take 1 (400 mg) capsule by mouth three times a day with meals. Also take 3 (1200mg ) capsules by mouth daily at bedtime.    [provider]  glimepiride (AMARYL) 2 MG tablet Take 2 mg by mouth daily with breakfast.     [provider]  hydrocortisone 2.5 % lotion Apply 1 application topically 2 (two) times daily as needed.    [provider]  Insulin Glargine (TOUJEO SOLOSTAR) 300 UNIT/ML SOPN Inject 128 Units into the skin at bedtime.     [provider]  insulin lispro (HUMALOG) 100 UNIT/ML injection Inject 36 Units into the skin 3 (three) times daily before meals. For CBG >/= 150 inject 10 additional units subcutaneously    [provider]  isosorbide mononitrate (IMDUR) 30 MG 24 hr tablet Take 30 mg by mouth daily.     [provider]  ketorolac (ACULAR) 0.4 % SOLN Apply 1 drop to eye 4 (four) times daily. Right eye     [provider]  levothyroxine (SYNTHROID, LEVOTHROID) 25 MCG tablet Take 25 mcg by mouth daily before breakfast.     [provider]  Linaclotide (LINZESS) 145 MCG CAPS capsule Take 145 mcg by mouth daily. Give 30 minutes prior to first meal and taken on an empty stomach.    [provider]  linagliptin (TRADJENTA) 5 MG TABS tablet Take 5 mg by mouth daily.     [provider]  Melatonin 3 MG TABS Take 6 mg by mouth at bedtime.     [provider]  Menthol, Topical Analgesic, (BIOFREEZE) 4 % GEL Apply topically 4 (four) times daily. Apply to both feet.    [provider]  metFORMIN (GLUCOPHAGE) 1000 MG tablet Take 1,000 mg by mouth 2 (two) times daily with a meal.  [provider]  moxifloxacin (VIGAMOX) 0.5 % ophthalmic solution Place 1 drop into the left eye 4 (four) times daily. While awake     [provider]  nitroGLYCERIN (NITROSTAT) 0.4 MG SL tablet Place 0.4 mg under the tongue every 5 (five) minutes as needed for chest pain. Notify MD if not relieved by NTG    [provider]  omeprazole (PRILOSEC) 20 MG capsule Take 20 mg by mouth daily.     [provider]  Oxycodone HCl 10 MG TABS Take 10 mg by mouth. Daily at 12 am, 8 am, 12 pm, 4 pm, and 8 pm,    [provider]  OXYGEN Inhale 2 L into the lungs as needed (for O2 sats 88% or lower).     [provider]  Polyethyl Glycol-Propyl Glycol (SYSTANE ULTRA OP) Place 1 drop into both eyes at bedtime as needed. Wait 3 to 5 minutes between 2 eye meds    [provider]  prednisoLONE acetate (PRED FORTE) 1 % ophthalmic suspension Place 1 drop into the left eye as directed. 1 drop QID x1 week; then TID x1 week; then BID x1 week; then QD x1 week; then stop 12/29/16   [provider]  promethazine (PHENERGAN) 12.5 MG tablet Take 12.5 mg by mouth every 6 (six) hours as needed for nausea or vomiting.    [provider]   sertraline (ZOLOFT) 100 MG tablet Take 100 mg by mouth daily. Take by mouth daily  along with 50 mg tablet to equal 150 mg total.    [provider]  sertraline (ZOLOFT) 50 MG tablet Take 50 mg by mouth daily. Take along with 100mg  to make 150mg  total    [provider]  spironolactone (ALDACTONE) 25 MG tablet Take 25 mg by mouth 2 (two) times daily. For edema    [provider]  tiotropium (SPIRIVA) 18 MCG inhalation capsule Inhale 1 capsule (18 MCG CP Handihaler)  contents by taking two separate inhalations via handihaler device daily for COPD    [provider]    Physical Exam:  Constitutional: Obese appearing female in some respiratory discomfort Vitals:   03/14/2019 0745 03/30/2019 0800 03/24/2019 0815 04/01/2019 0830  BP: 117/71 110/61 134/83 (!) 127/110  Pulse: (!) 145  (!) 135   Resp: 13 12 20 20   Temp:      TempSrc:      SpO2: 95%  (!) 86%   Weight:      Height:       Eyes: PERRL, lids and conjunctivae normal ENMT: Mucous membranes are moist. Posterior pharynx clear of any exudate or lesions. Neck: normal, no thyromegaly Respiratory: Labored respirations with positive crackles appreciated bilaterally.  Patient talking in short sentences on high flow nasal cannula oxygen 12 L. Cardiovascular: Irregular irregular and tachycardic. Abdomen: no tenderness, no masses palpated. No hepatosplenomegaly. Bowel sounds positive.  Musculoskeletal: no clubbing / cyanosis.  Deformity present of the left lower extremity. Skin: no rashes, lesions, ulcers. No induration Neurologic: CN 2-12 grossly intact.  Able to move all extremities Psychiatric: Poor judgment and insight. Alert and oriented x 3.  Aggravated mood.     Labs on Admission: I have personally reviewed following labs and imaging studies  CBC: Recent Labs  Lab 03/31/2019 0433  WBC 13.7*  NEUTROABS 12.7*  HGB 10.7*  HCT 38.5  MCV 93.0  PLT 620   Basic Metabolic Panel: Recent Labs  Lab  04/11/2019 0433  NA 141  K 4.8  CL 95*  CO2 33*  GLUCOSE 148*  BUN 19  CREATININE 0.57  CALCIUM 8.9   GFR: Estimated Creatinine Clearance: 61 mL/min (by C-G formula based on SCr of 0.57 mg/dL). Liver Function Tests: Recent Labs  Lab 04/11/2019 0433  AST 20  ALT 16  ALKPHOS 85  BILITOT 0.7  PROT 7.3  ALBUMIN 3.1*   No results for input(s): LIPASE, AMYLASE in the last 168 hours. No results for input(s): AMMONIA in the last 168 hours. Coagulation Profile: No results for input(s): INR, PROTIME in the last 168 hours. Cardiac Enzymes: No results for input(s): CKTOTAL, CKMB, CKMBINDEX, TROPONINI in the last 168 hours. BNP (last 3 results) No results for input(s): PROBNP in the last 8760 hours. HbA1C: No results for input(s): HGBA1C in the last 72 hours. CBG: No results for input(s): GLUCAP in the last 168 hours. Lipid Profile: No results for input(s): CHOL, HDL, LDLCALC, TRIG, CHOLHDL, LDLDIRECT in the last 72 hours. Thyroid Function Tests: No results for input(s): TSH, T4TOTAL, FREET4, T3FREE, THYROIDAB in the last 72 hours. Anemia Panel: No results for input(s): VITAMINB12, FOLATE, FERRITIN, TIBC, IRON, RETICCTPCT in the last 72 hours. Urine analysis:    Component Value Date/Time   COLORURINE AMBER (A) 04/09/2019 0630   APPEARANCEUR CLOUDY (A) 04/04/2019 0630   APPEARANCEUR Turbid 10/11/2014 1430   LABSPEC 1.039 (H) 03/21/2019 0630   LABSPEC 1.020 10/11/2014 1430   PHURINE 5.0 04/02/2019 0630   GLUCOSEU 50 (A) 03/26/2019 0630   GLUCOSEU Negative 10/11/2014 1430   HGBUR NEGATIVE 03/27/2019 0630   BILIRUBINUR NEGATIVE 04/04/2019 0630   BILIRUBINUR Negative 10/11/2014 1430   KETONESUR 20 (A) 03/31/2019 0630   PROTEINUR >=300 (A) 04/03/2019 0630   UROBILINOGEN 1.0 05/16/2014 0239   NITRITE NEGATIVE 04/01/2019 0630   LEUKOCYTESUR TRACE (A) 04/07/2019 0630   LEUKOCYTESUR 2+ 10/11/2014 1430   Sepsis Labs: Recent Results (from the past 240 hour(s))  SARS Coronavirus 2  (CEPHEID- Performed in [place] hospital lab), Hosp Order     Status: None   Collection Time: 03/26/2019  3:54 AM   Specimen: Nasopharyngeal Swab  Result Value Ref Range Status   SARS Coronavirus 2 NEGATIVE NEGATIVE Final    Comment: (NOTE) If result is NEGATIVE SARS-CoV-2 target nucleic acids are NOT DETECTED. The SARS-CoV-2 RNA is generally detectable in upper and lower  respiratory specimens during the acute phase of infection. The lowest  concentration of SARS-CoV-2 viral copies this assay can detect is 250  copies / mL. A negative result does not preclude SARS-CoV-2 infection  and should not be used as the sole basis for treatment or other  patient management decisions.  A negative result may occur with  improper specimen collection / handling, submission of specimen other  than nasopharyngeal swab, presence of viral mutation(s) within the  areas targeted by this assay, and inadequate number of viral copies  (<250 copies / mL). A negative result must be combined with clinical  observations, patient history, and epidemiological information. If result is POSITIVE SARS-CoV-2 target nucleic acids are DETECTED. The SARS-CoV-2 RNA is generally detectable in upper and lower  respiratory specimens dur ing the acute phase of infection.  Positive  results are indicative of active infection with SARS-CoV-2.  Clinical  correlation with patient history and other diagnostic information is  necessary to determine patient infection status.  Positive results do  not rule out bacterial infection or co-infection with other viruses. If result is PRESUMPTIVE POSTIVE SARS-CoV-2 nucleic acids MAY BE PRESENT.  A presumptive positive result was obtained on the submitted specimen  and confirmed on repeat testing.  While 2019 novel coronavirus  (SARS-CoV-2) nucleic acids may be present in the submitted sample  additional confirmatory testing may be necessary for epidemiological  and / or clinical  management purposes  to differentiate between  SARS-CoV-2 and other Sarbecovirus currently known to infect humans.  If clinically indicated additional testing with an alternate test  methodology [phone]) is advised. The SARS-CoV-2 RNA is generally  detectable in upper and lower respiratory sp ecimens during the acute  phase of infection. The expected result is Negative. Fact Sheet for Patients:  StrictlyIdeas.no Fact Sheet for Healthcare Providers: BankingDealers.co.za This test is not yet approved or cleared by the [place] FDA and has been authorized for detection and/or diagnosis of SARS-CoV-2 by FDA under an Emergency Use Authorization (EUA).  This EUA will remain in effect (meaning this test can be used) for the duration of the COVID-19 declaration under Section 564(b)(1) of the Act, 21 U.S.C. section 360bbb-3(b)(1), unless the authorization is terminated or revoked sooner. Performed at [facility], [place] [address] [place], [place] [address]      Radiological Exams on Admission: Dg Chest Portable 1 View  Result Date: 03/20/2019 CLINICAL DATA:  Shortness of breath EXAM: PORTABLE CHEST 1 VIEW COMPARISON:  None. FINDINGS: Mild cardiomegaly with streaky opacities in the right lung. Shallow lung inflation. Small right pleural effusion. IMPRESSION: Cardiomegaly with shallow lung inflation and small right pleural effusion. Streaky opacities in the right upper lung. Electronically Signed   By: Ulyses Jarred M.D.   On: 03/16/2019 04:29    EKG: Independently reviewed.  A. fib with RVR and 155 bpm  Assessment/Plan Sepsis secondary to community-acquired pneumonia: Acute.  Patient presented tachypneic and tachycardic with complaints of fever and shortness of breath over the last week.  Afebrile on arrival, but WBC elevated at 13.7.  Chest x-ray showing cardiomegaly with shallow lung inflation and small right pleural effusion and  streaky opacities in the upper right lung concerning for pneumonia.  Blood cultures were obtained and patient was started on empiric antibiotics ceftriaxone and azithromycin.  Patient also received 1 L normal saline IV fluids.   -Admit to progressive bed -Follow-up blood, urine, and sputum cultures  -Check urine strep pneumonia and Legionella -Add-on lactic acid -Continue antibiotics ceftriaxone and azithromycin IV -Mucinex  -Levalbuterol nebs as needed for shortness of breath/wheezing  Acute on chronic respiratory failure with hypoxia, COPD: Patient at baseline on 4 L of oxygen.  -Continuous pulse oximetry with nasal cannula oxygen as needed -Breathing treatments as needed  Atrial fibrillation with RVR: Acute.  Patient found in atrial fibrillation with RVR with heart rates into the 150s.  CHA2DS2-VASc score = 8. -Continue diltiazem drip   -Lovenox per pharmacy to decreased fluid given -Check echocardiogram -Goal potassium 4 and magnesium 2  -Cardiology consulted, follow-up for further recommendation  Suspected CHF exacerbation, cardiomegaly: Acute.  Chest x-ray show enlarged heart. -Strict intake and output -Daily weight -Add-one BNP -Give 20 mg IV stat -Follow-up echocardiogram   Diabetes mellitus type 2 with neuropathy (uncontrolled): Last hemoglobin A1c noted to be 7.4 back in 2018.  On admission patient's blood glucose 148.  She is treated with oral medications glargine 76 units q. morning, and glargine 54 units q. evening. -Hypoglycemic protocol  -Hold home oral medicines -Check hemoglobin A1c in a.m. -Glargine not initially ordered back due to low blood sugar -CBGs q. before meals and at bedtime  with resistant SSI  -Adjust regimen as needed   Hypochromic anemia: Chronic.  Hemoglobin 10.7 with low MCH on admission which appears similar to previous. -Recheck CBC in a.m. with iron studies  Essential hypertension: Blood pressures currently stable. -Continue home regimen when  medically appropriate  Opioid dependence: Chronic.  Patient takes 10 mg of oxycodone 3 times daily.  Patient with a history of reflex dystrophy and chronic pain. -Continue oxycodone, baclofen, lidocaine patches, and gabapentin  CAD: Patient noted previous history of having at least 5 stents placed. -Continue statin  CVA: History of CVA and left spastic hemiplegia.  Hypothyroidism -Check TSH (0.276) -Question need levothyroxine dose adjustment  History of depression -Continue Zoloft  Hyperlipidemia -Continue atorvastatin   DVT prophylaxis: Lovenox Code Status: DNR Family Communication: Discussed plan of care with patient's husband over phone Disposition Plan: Likely discharge back to skilled nursing facility once medically stable Consults called: Cardiology Admission status: inpatient  Norval Morton MD [facility] Pager [phone]   If 7PM-7AM, please contact night-coverage [email or web address] Password [facility]  04/04/2019, 8:53 AM

## 2019-04-06 ENCOUNTER — Inpatient Hospital Stay (HOSPITAL_COMMUNITY): Payer: Medicare Other

## 2019-04-06 DIAGNOSIS — I4891 Unspecified atrial fibrillation: Secondary | ICD-10-CM

## 2019-04-06 LAB — C-REACTIVE PROTEIN: CRP: 10.6 mg/dL — ABNORMAL HIGH (ref <1.0)

## 2019-04-06 LAB — RESPIRATORY PANEL BY PCR

## 2019-04-06 LAB — ECHOCARDIOGRAM LIMITED
Height: 63 in
Weight: 2257.51 oz

## 2019-04-06 LAB — LEGIONELLA PNEUMOPHILA SEROGP 1 UR AG: L. pneumophila Serogp 1 Ur Ag: NEGATIVE

## 2019-04-06 LAB — SARS CORONAVIRUS 2 BY RT PCR (HOSPITAL ORDER, PERFORMED IN ~~LOC~~ HOSPITAL LAB): SARS Coronavirus 2: NEGATIVE

## 2019-04-06 LAB — BASIC METABOLIC PANEL
Anion gap: 13 (ref 5–15)
BUN: 18 mg/dL (ref 8–23)
CO2: 37 mmol/L — ABNORMAL HIGH (ref 22–32)
Calcium: 8.7 mg/dL — ABNORMAL LOW (ref 8.9–10.3)
Chloride: 91 mmol/L — ABNORMAL LOW (ref 98–111)
Creatinine, Ser: 0.69 mg/dL (ref 0.44–1.00)
GFR calc Af Amer: >60 mL/min (ref >60)
GFR calc non Af Amer: >60 mL/min (ref >60)
Glucose, Bld: 161 mg/dL — ABNORMAL HIGH (ref 70–99)
Potassium: 4.1 mmol/L (ref 3.5–5.1)
Sodium: 141 mmol/L (ref 135–145)

## 2019-04-06 LAB — MRSA PCR SCREENING: MRSA by PCR: POSITIVE — AB

## 2019-04-06 LAB — MAGNESIUM: Magnesium: 2 mg/dL (ref 1.7–2.4)

## 2019-04-06 LAB — CBC
HCT: 36.4 % (ref 36.0–46.0)
Hemoglobin: 10.3 g/dL — ABNORMAL LOW (ref 12.0–15.0)
MCH: 26.1 pg (ref 26.0–34.0)
MCHC: 28.3 g/dL — ABNORMAL LOW (ref 30.0–36.0)
MCV: 92.4 fL (ref 80.0–100.0)
Platelets: 288 10*3/uL (ref 150–400)
RBC: 3.94 MIL/uL (ref 3.87–5.11)
RDW: 18.2 % — ABNORMAL HIGH (ref 11.5–15.5)
WBC: 13 10*3/uL — ABNORMAL HIGH (ref 4.0–10.5)
nRBC: 0.2 % (ref 0.0–0.2)

## 2019-04-06 LAB — IRON AND TIBC
Iron: 58 ug/dL (ref 28–170)
Saturation Ratios: 19 % (ref 10.4–31.8)
TIBC: 305 ug/dL (ref 250–450)
UIBC: 247 ug/dL

## 2019-04-06 LAB — D-DIMER, QUANTITATIVE: D-Dimer, Quant: 7.71 ug/mL-FEU — ABNORMAL HIGH (ref 0.00–0.50)

## 2019-04-06 LAB — LACTATE DEHYDROGENASE: LDH: 242 U/L — ABNORMAL HIGH (ref 98–192)

## 2019-04-06 LAB — FERRITIN: Ferritin: 170 ng/mL (ref 11–307)

## 2019-04-06 LAB — SEDIMENTATION RATE: Sed Rate: 53 mm/hr — ABNORMAL HIGH (ref 0–22)

## 2019-04-06 LAB — GLUCOSE, CAPILLARY
Glucose-Capillary: 141 mg/dL — ABNORMAL HIGH (ref 70–99)
Glucose-Capillary: 115 mg/dL — ABNORMAL HIGH (ref 70–99)
Glucose-Capillary: 243 mg/dL — ABNORMAL HIGH (ref 70–99)
Glucose-Capillary: 164 mg/dL — ABNORMAL HIGH (ref 70–99)

## 2019-04-06 LAB — HEMOGLOBIN A1C
Hgb A1c MFr Bld: 7.6 % — ABNORMAL HIGH (ref 4.8–5.6)
Mean Plasma Glucose: 171.42 mg/dL

## 2019-04-06 MED ORDER — IOHEXOL 350 MG/ML SOLN
100.0000 mL | Freq: Once | INTRAVENOUS | Status: AC | PRN
  Administered 2019-04-06: 100 mL via INTRAVENOUS

## 2019-04-06 MED ORDER — POTASSIUM CHLORIDE CRYS ER 20 MEQ PO TBCR
20.0000 meq | EXTENDED_RELEASE_TABLET | Freq: Once | ORAL | Status: AC
  Administered 2019-04-06: 20 meq via ORAL
  Filled 2019-04-06: qty 1

## 2019-04-06 MED ORDER — PROMETHAZINE HCL 25 MG/ML IJ SOLN
12.5000 mg | Freq: Four times a day (QID) | INTRAMUSCULAR | Status: DC | PRN
  Administered 2019-04-06 (×2): 12.5 mg via INTRAVENOUS
  Filled 2019-04-06 (×3): qty 1

## 2019-04-06 MED ORDER — DILTIAZEM HCL 30 MG PO TABS
30.0000 mg | ORAL_TABLET | Freq: Three times a day (TID) | ORAL | Status: DC
  Administered 2019-04-06 – 2019-04-09 (×8): 30 mg via ORAL
  Filled 2019-04-06 (×8): qty 1

## 2019-04-06 MED ORDER — METOPROLOL TARTRATE 25 MG PO TABS
25.0000 mg | ORAL_TABLET | Freq: Four times a day (QID) | ORAL | Status: DC
  Administered 2019-04-06 – 2019-04-08 (×7): 25 mg via ORAL
  Filled 2019-04-06 (×9): qty 1

## 2019-04-06 MED ORDER — CHLORHEXIDINE GLUCONATE CLOTH 2 % EX PADS
6.0000 | MEDICATED_PAD | Freq: Every day | CUTANEOUS | Status: AC
  Administered 2019-04-06 – 2019-04-09 (×5): 6 via TOPICAL

## 2019-04-06 MED ORDER — MUPIROCIN 2 % EX OINT
1.0000 "application " | TOPICAL_OINTMENT | Freq: Two times a day (BID) | CUTANEOUS | Status: AC
  Administered 2019-04-06 – 2019-04-10 (×10): 1 via NASAL
  Filled 2019-04-06 (×3): qty 22

## 2019-04-06 MED ORDER — FUROSEMIDE 10 MG/ML IJ SOLN
80.0000 mg | Freq: Once | INTRAMUSCULAR | Status: AC
  Administered 2019-04-06: 14:00:00 80 mg via INTRAVENOUS
  Filled 2019-04-06: qty 8

## 2019-04-06 NOTE — Progress Notes (Signed)
  Echocardiogram 2D Echocardiogram has been performed.  Susan [name] 04/06/2019, 10:51 AM

## 2019-04-06 NOTE — Progress Notes (Signed)
Progress Note  Patient Name: Susan [name] Date of Encounter: 04/06/2019  Primary Cardiologist:  New  Subjective   Patient says her chest hurts, worse with deep breath    Inpatient Medications    Scheduled Meds: . atorvastatin  80 mg Oral q1800  . baclofen  10 mg Oral TID  . Chlorhexidine Gluconate Cloth  6 each Topical Q0600  . divalproex  250 mg Oral q morning - 10a  . enoxaparin (LOVENOX) injection  60 mg Subcutaneous Q12H  . fluticasone  2 spray Each Nare q morning - 10a  . furosemide  20 mg Intravenous STAT  . gabapentin  1,200 mg Oral QHS  . gabapentin  400 mg Oral BID AC  . guaiFENesin  600 mg Oral BID  . insulin aspart  0-20 Units Subcutaneous TID WC  . insulin aspart  0-5 Units Subcutaneous QHS  . isosorbide mononitrate  30 mg Oral QPM  . levothyroxine  50 mcg Oral QAC breakfast  . lidocaine  2 patch Transdermal Q24H  . Melatonin  6 mg Oral QHS  . metoprolol tartrate  5 mg Intravenous 6 X Daily  . mupirocin ointment  1 application Nasal BID  . oxyCODONE  10 mg Oral TID  . pantoprazole  40 mg Oral Daily  . QUEtiapine  25 mg Oral QHS  . sertraline  200 mg Oral Daily  . sodium chloride flush  3 mL Intravenous Q12H  . traZODone  125 mg Oral QHS  . umeclidinium bromide  1 puff Inhalation Daily   Continuous Infusions: . azithromycin 500 mg (04/06/19 0605)  . cefTRIAXone (ROCEPHIN)  IV 2 g (04/06/19 0452)  . diltiazem (CARDIZEM) infusion 5 mg/hr (04/06/19 0700)   PRN Meds: acetaminophen **OR** acetaminophen, fentaNYL (SUBLIMAZE) injection, levalbuterol, ondansetron **OR** ondansetron (ZOFRAN) IV, promethazine   Vital Signs    Vitals:   04/06/19 0408 04/06/19 0508 04/06/19 0720 04/06/19 0744  BP: 139/90 99/66    Pulse: 99 86 91   Resp: 15 15 19    Temp:  97.9 F (36.6 C)    TempSrc:  Oral    SpO2: 98% 100% 97% 94%  Weight:  64 kg    Height:        Intake/Output Summary (Last 24 hours) at 04/06/2019 0804 Last data filed at 04/06/2019 0700 Gross per 24  hour  Intake 662.66 ml  Output 1750 ml  Net -1087.34 ml   NEt net 450 cc (per nursing incomplete, had accidents)  Last 3 Weights 04/06/2019 03/23/2019 01/25/2017  Weight (lbs) 141 lb 1.5 oz 143 lb 194 lb  Weight (kg) 64 kg 64.864 kg 87.998 kg      Telemetry     afib 80s  - Personally Reviewed  ECG     - Personally Reviewed  Physical Exam   GEN: .  Pt in NAD   Neck: Neck is full Cardiac: Irreg irreg  no murmurs, rubs, or gallops.  Respiratory: Rhonchi Chest   Mild tender   GI: Soft, nontender, non-distended  MS: Tr edema; No deformity. Neuro:  L sided weakness/paralysis   Labs    High Sensitivity Troponin:   Recent Labs  Lab 04/04/2019 1029 04/04/2019 1218  TROPONINIHS 9 13      Cardiac EnzymesNo results for input(s): TROPONINI in the last 168 hours. No results for input(s): TROPIPOC in the last 168 hours.   Chemistry Recent Labs  Lab 04/09/2019 0433  NA 141  K 4.8  CL 95*  CO2 33*  GLUCOSE 148*  BUN 19  CREATININE 0.57  CALCIUM 8.9  PROT 7.3  ALBUMIN 3.1*  AST 20  ALT 16  ALKPHOS 85  BILITOT 0.7  GFRNONAA >60  GFRAA >60  ANIONGAP 13     Hematology Recent Labs  Lab 03/27/2019 0433  WBC 13.7*  RBC 4.14  HGB 10.7*  HCT 38.5  MCV 93.0  MCH 25.8*  MCHC 27.8*  RDW 18.3*  PLT 282    BNP Recent Labs  Lab 03/14/2019 0433  BNP 501.2*     DDimer No results for input(s): DDIMER in the last 168 hours.   Radiology    Dg Chest Portable 1 View  Result Date: 03/30/2019 CLINICAL DATA:  Shortness of breath EXAM: PORTABLE CHEST 1 VIEW COMPARISON:  None. FINDINGS: Mild cardiomegaly with streaky opacities in the right lung. Shallow lung inflation. Small right pleural effusion. IMPRESSION: Cardiomegaly with shallow lung inflation and small right pleural effusion. Streaky opacities in the right upper lung. Electronically Signed   By: Ulyses Jarred M.D.   On: 04/11/2019 04:29    Cardiac Studies   IMPRESSIONS    1. The left ventricle has low normal  systolic function, with an ejection fraction of 50-55%. The cavity size was normal. There is mildly increased left ventricular wall thickness. Left ventricular diastolic Doppler parameters are indeterminate in the  setting of atrial fibrillation.  2. The right ventricle has normal systolic function. The cavity was normal. There is no increase in right ventricular wall thickness. Right ventricular systolic pressure is moderately elevated with an estimated pressure of 48.9 mmHg.  3. The aortic valve is tricuspid. Mild calcification of the aortic valve. Moderate aortic annular calcification noted.  4. The mitral valve is grossly normal. Mild calcification of the mitral valve leaflet. There is mild mitral annular calcification present.  5. The tricuspid valve is grossly normal.  6. The aorta is normal in size and structure.  7. Probable prominent epicardial fat pad.  FINDINGS  Left Ventricle: The left ventricle has low normal systolic function, with an ejection fraction of 50-55%. The cavity size was normal. There is mildly increased left ventricular wall thickness. Left ventricular diastolic Doppler parameters are  indeterminate.  Right Ventricle: The right ventricle has normal systolic function. The cavity was normal. There is no increase in right ventricular wall thickness. Right ventricular systolic pressure is moderately elevated with an estimated pressure of 48.9 mmHg.  Left Atrium: Left atrial size was normal in size.  Right Atrium: Right atrial size was normal in size. Right atrial pressure is estimated at 15 mmHg.  Interatrial Septum: No atrial level shunt detected by color flow Doppler.  Pericardium: There is no evidence of pericardial effusion. There is a pericardial fat pad noted.  Mitral Valve: The mitral valve is grossly normal. Mild calcification of the mitral valve leaflet. There is mild mitral annular calcification present. Mitral valve regurgitation is not visualized by  color flow Doppler.  Tricuspid Valve: The tricuspid valve is grossly normal. Tricuspid valve regurgitation is trivial by color flow Doppler.  Aortic Valve: The aortic valve is tricuspid Mild calcification of the aortic valve. Aortic valve regurgitation was not visualized by color flow Doppler. Moderate aortic annular calcification noted.  Pulmonic Valve: The pulmonic valve was grossly normal. Pulmonic valve regurgitation is trivial by color flow Doppler.  Aorta: The aorta is normal in size and structure.    +--------------+--------++ LEFT VENTRICLE         +--------------+--------++ PLAX 2D                +--------------+--------++  LVOT diam:    1.90 cm  +--------------+--------++ LVOT Area:    2.84 cm +--------------+--------++                        +--------------+--------++  +---------------+---------++ RIGHT VENTRICLE          +---------------+---------++ RVSP:          48.9 mmHg +---------------+---------++  +------------+----------+++ RIGHT ATRIUM           +------------+----------+++ RA Pressure:15.00 mmHg +------------+----------+++  +---------------+-----------++ TRICUSPID VALVE            +---------------+-----------++ TR Peak grad:  33.9 mmHg   +---------------+-----------++ TR Vmax:       291.00 cm/s +---------------+-----------++ Estimated RAP: 15.00 mmHg  +---------------+-----------++ RVSP:          48.9 mmHg   +---------------+-----------++   +--------------+-------+ SHUNTS                +--------------+-------+ Systemic Diam:1.90 cm +--------------+-------+    Rozann Lesches MD Electronically signed by Rozann Lesches MD Signature Date/Time: 04/06/2019/11:04:17 AM     Patient Profile     68 y.o. female with a hx of CAD (remote MI, interventions at [facility], last seen 2002), CVA, HTN, DM who is being seen today for the evaluation of atrial fibrillation  at the request  of Dr Tamala Julian     Assessment & Plan    1  Atrial fibrillation  Rates are improved on current regimen  Pt on lovenoix full strength  Echo as noted above    Recomm:  I would switch meds to PO since since is eating   2  Hx CAD with remote interverntions at [facility]   Last seen by Sharolyn Douglas there in 2002    Echo as noted above Current CP is pleuritic Responded clinically to lasix   I would give one more time   3  HTN  BP is OK  4  HL  Continue lipiitor  5  Pulmonary  On empiric ABX   Note repeat COVID pending  Pt is on less O2 since yesterday   Some of issues may be related to diastolic dysfunction in setting of Rapid AFib that improved with diuresis   On less O2 today  For questions or updates, please contact [place] Please consult [email or web address] for contact info under        Signed, Dorris Carnes, MD  04/06/2019, 8:04 AM

## 2019-04-06 NOTE — Progress Notes (Addendum)
CT scan report states ground glass opacity.Marland KitchenMarland KitchenPotentially including Covid-19. Send out still pending. Dr Doristine Bosworth notified.

## 2019-04-06 NOTE — Progress Notes (Signed)
Order received for rapid in house covid-19 test to be repeated at this time. HEPA filter on for negative pressure as precaution.

## 2019-04-06 NOTE — Progress Notes (Signed)
PROGRESS NOTE    Susan [name]  MEQ:[id number] DOB: July 19, 1951 DOA: 04/08/2019 PCP: Lattie Corns, PA-C   Brief Narrative:  Susan [name] is a 69 y.o. female with medical history significant of HTN, HLD, CAD s/p stents, COPD on 4 to 6 L, CVA with residual weakness, hypothyroidism, and depression; who presented with complaints of shortness of breath over the last week.No previous history of having an irregular heartbeat and initially stated that she wanted to go home.  Associated symptoms of productive cough, subjective fevers, palpitations, and generalized malaise.  Denied having any active chest pain, nausea, vomiting, diaphoresis, or leg pain.  Records show patient had recently been started on Levaquin earlier this week.  EMS found the patient with O2 saturations into the 80s which she was placed on a nonrebreather.  ED Course: On admission to the emergency department patient was noted to be afebrile, pulse up to 150s in atrial fibrillation with RVR, blood pressures 101/58-134/83, and O2 saturations as low as 84% with improvement to upper 80s to low 90s on 15 L flow oxygen.  Labs revealed WBC 13.7 and hemoglobin 10.7.  Chest x-ray showing cardiomegaly with shallow lung inflation and small right pleural effusion.  Patient was started on Cardizem drip.  Admitted to hospital service and cardiology consulted.  Consultants:   Cardiology  Procedures:   None  Antimicrobials:   Rocephin and azithromycin started 03/13/2019   Subjective: Patient seen and examined.  Patient is too lethargic to have meaningful conversation however she was arousable with deep shaking.  Was able to talk single words here and there which were not comprehensible.  Looks comfortable.  Not dyspneic.  Objective: Vitals:   04/06/19 0720 04/06/19 0744 04/06/19 0808 04/06/19 0809  BP:   (!) 110/59   Pulse: 91   83  Resp: 19  15 17   Temp:      TempSrc:      SpO2: 97% 94%  100%  Weight:      Height:         Intake/Output Summary (Last 24 hours) at 04/06/2019 1000 Last data filed at 04/06/2019 0700 Gross per 24 hour  Intake 662.66 ml  Output 1750 ml  Net -1087.34 ml   Filed Weights   04/01/2019 0349 04/06/19 0508  Weight: 64.9 kg 64 kg    Examination:  General exam: Appears very lethargic but comfortable  Respiratory system: Diminished breath sounds at the bases, crackles at the middle lobes bilaterally. Respiratory effort normal. Cardiovascular system: S1 & S2 heard, irregularly irregular rate and rhythm. No JVD, murmurs, rubs, gallops or clicks.  Gastrointestinal system: Abdomen is nondistended, soft and nontender. No organomegaly or masses felt. Normal bowel sounds heard. Central nervous system: Too lethargic to assess orientation, unable perform thorough neurological examination due inability to follow commands.   Skin: No rashes, lesions or ulcers Psychiatry: Unable to assess due to lethargy.   Data Reviewed: I have personally reviewed following labs and imaging studies  CBC: Recent Labs  Lab 04/04/2019 0433 04/06/19 0719  WBC 13.7* 13.0*  NEUTROABS 12.7*  --   HGB 10.7* 10.3*  HCT 38.5 36.4  MCV 93.0 92.4  PLT 282 297   Basic Metabolic Panel: Recent Labs  Lab 03/18/2019 0433 03/29/2019 1029 04/06/19 0719  NA 141  --  141  K 4.8  --  4.1  CL 95*  --  91*  CO2 33*  --  37*  GLUCOSE 148*  --  161*  BUN 19  --  18  CREATININE 0.57  --  0.69  CALCIUM 8.9  --  8.7*  MG  --  2.2 2.0   GFR: Estimated Creatinine Clearance: 60.6 mL/min (by C-G formula based on SCr of 0.69 mg/dL). Liver Function Tests: Recent Labs  Lab 04/07/2019 0433  AST 20  ALT 16  ALKPHOS 85  BILITOT 0.7  PROT 7.3  ALBUMIN 3.1*   No results for input(s): LIPASE, AMYLASE in the last 168 hours. No results for input(s): AMMONIA in the last 168 hours. Coagulation Profile: Recent Labs  Lab 03/27/2019 1029  INR 1.1   Cardiac Enzymes: No results for input(s): CKTOTAL, CKMB, CKMBINDEX, TROPONINI in  the last 168 hours. BNP (last 3 results) No results for input(s): PROBNP in the last 8760 hours. HbA1C: Recent Labs    04/06/19 0719  HGBA1C 7.6*   CBG: Recent Labs  Lab 03/27/2019 1112 04/08/2019 1408 04/02/2019 1611 03/23/2019 2122 04/06/19 0735  GLUCAP 140* 128* 154* 83 141*   Lipid Profile: No results for input(s): CHOL, HDL, LDLCALC, TRIG, CHOLHDL, LDLDIRECT in the last 72 hours. Thyroid Function Tests: Recent Labs    03/27/2019 0936  TSH 0.276*   Anemia Panel: Recent Labs    04/06/19 0719  FERRITIN 170  TIBC 305  IRON 58   Sepsis Labs: Recent Labs  Lab 04/08/2019 1029  PROCALCITON 0.14  LATICACIDVEN 1.4    Recent Results (from the past 240 hour(s))  SARS Coronavirus 2 (CEPHEID- Performed in [facility] hospital lab), Hosp Order     Status: None   Collection Time: 04/03/2019  3:54 AM   Specimen: Nasopharyngeal Swab  Result Value Ref Range Status   SARS Coronavirus 2 NEGATIVE NEGATIVE Final    Comment: (NOTE) If result is NEGATIVE SARS-CoV-2 target nucleic acids are NOT DETECTED. The SARS-CoV-2 RNA is generally detectable in upper and lower  respiratory specimens during the acute phase of infection. The lowest  concentration of SARS-CoV-2 viral copies this assay can detect is 250  copies / mL. A negative result does not preclude SARS-CoV-2 infection  and should not be used as the sole basis for treatment or other  patient management decisions.  A negative result may occur with  improper specimen collection / handling, submission of specimen other  than nasopharyngeal swab, presence of viral mutation(s) within the  areas targeted by this assay, and inadequate number of viral copies  (<250 copies / mL). A negative result must be combined with clinical  observations, patient history, and epidemiological information. If result is POSITIVE SARS-CoV-2 target nucleic acids are DETECTED. The SARS-CoV-2 RNA is generally detectable in upper and lower  respiratory  specimens dur ing the acute phase of infection.  Positive  results are indicative of active infection with SARS-CoV-2.  Clinical  correlation with patient history and other diagnostic information is  necessary to determine patient infection status.  Positive results do  not rule out bacterial infection or co-infection with other viruses. If result is PRESUMPTIVE POSTIVE SARS-CoV-2 nucleic acids MAY BE PRESENT.   A presumptive positive result was obtained on the submitted specimen  and confirmed on repeat testing.  While 2019 novel coronavirus  (SARS-CoV-2) nucleic acids may be present in the submitted sample  additional confirmatory testing may be necessary for epidemiological  and / or clinical management purposes  to differentiate between  SARS-CoV-2 and other Sarbecovirus currently known to infect humans.  If clinically indicated additional testing with an alternate test  methodology [phone]) is advised. The SARS-CoV-2 RNA is generally  detectable  in upper and lower respiratory sp ecimens during the acute  phase of infection. The expected result is Negative. Fact Sheet for Patients:  StrictlyIdeas.no Fact Sheet for Healthcare Providers: BankingDealers.co.za This test is not yet approved or cleared by the [place] FDA and has been authorized for detection and/or diagnosis of SARS-CoV-2 by FDA under an Emergency Use Authorization (EUA).  This EUA will remain in effect (meaning this test can be used) for the duration of the COVID-19 declaration under Section 564(b)(1) of the Act, 21 U.S.C. section 360bbb-3(b)(1), unless the authorization is terminated or revoked sooner. Performed at [facility], [place] [address], [place], [place] [address]   Blood Culture (routine x 2)     Status: None (Preliminary result)   Collection Time: 03/29/2019  4:07 AM   Specimen: BLOOD  Result Value Ref Range Status   Specimen Description BLOOD  RIGHT WRIST  Final   Special Requests   Final    BOTTLES DRAWN AEROBIC AND ANAEROBIC Blood Culture results may not be optimal due to an excessive volume of blood received in culture bottles   Culture   Final    NO GROWTH 1 DAY Performed at [facility], [place] [address] [place], [place] [address]    Report Status PENDING  Incomplete  Blood Culture (routine x 2)     Status: None (Preliminary result)   Collection Time: 04/04/2019  5:16 AM   Specimen: BLOOD RIGHT HAND  Result Value Ref Range Status   Specimen Description BLOOD RIGHT HAND  Final   Special Requests   Final    BOTTLES DRAWN AEROBIC AND ANAEROBIC Blood Culture adequate volume   Culture   Final    NO GROWTH 1 DAY Performed at [facility], [place] [address] [place], [place] [address]    Report Status PENDING  Incomplete  Urine culture     Status: Abnormal (Preliminary result)   Collection Time: 03/17/2019  6:30 AM   Specimen: In/Out Cath Urine  Result Value Ref Range Status   Specimen Description IN/OUT CATH URINE  Final   Special Requests   Final    NONE Performed at [facility], [place] [address] [place], [place] [address]    Culture 3,000 COLONIES/mL GRAM NEGATIVE RODS (A)  Final   Report Status PENDING  Incomplete  MRSA PCR Screening     Status: Abnormal   Collection Time: 04/09/2019 11:12 PM   Specimen: Nasal Mucosa; Nasopharyngeal  Result Value Ref Range Status   MRSA by PCR POSITIVE (A) NEGATIVE Final    Comment:        The GeneXpert MRSA Assay (FDA approved for NASAL specimens only), is one component of a comprehensive MRSA colonization surveillance program. It is not intended to diagnose MRSA infection nor to guide or monitor treatment for MRSA infections. RESULT CALLED TO, READ BACK BY AND VERIFIED WITH: ZARSONA,R RN 3500 04/06/2019 MITCHELL,L Performed at [place] [address] [place], [place] [address]       Radiology Studies: Dg Chest Portable 1 View  Result Date:  04/01/2019 CLINICAL DATA:  Shortness of breath EXAM: PORTABLE CHEST 1 VIEW COMPARISON:  None. FINDINGS: Mild cardiomegaly with streaky opacities in the right lung. Shallow lung inflation. Small right pleural effusion. IMPRESSION: Cardiomegaly with shallow lung inflation and small right pleural effusion. Streaky opacities in the right upper lung. Electronically Signed   By: Ulyses Jarred M.D.   On: 04/04/2019 04:29    Scheduled  Meds: . atorvastatin  80 mg Oral q1800  . baclofen  10 mg Oral TID  . Chlorhexidine Gluconate Cloth  6 each Topical Q0600  . divalproex  250 mg Oral q morning - 10a  . enoxaparin (LOVENOX) injection  60 mg Subcutaneous Q12H  . fluticasone  2 spray Each Nare q morning - 10a  . gabapentin  1,200 mg Oral QHS  . gabapentin  400 mg Oral BID AC  . guaiFENesin  600 mg Oral BID  . insulin aspart  0-20 Units Subcutaneous TID WC  . insulin aspart  0-5 Units Subcutaneous QHS  . isosorbide mononitrate  30 mg Oral QPM  . levothyroxine  50 mcg Oral QAC breakfast  . lidocaine  2 patch Transdermal Q24H  . Melatonin  6 mg Oral QHS  . metoprolol tartrate  5 mg Intravenous 6 X Daily  . mupirocin ointment  1 application Nasal BID  . oxyCODONE  10 mg Oral TID  . pantoprazole  40 mg Oral Daily  . QUEtiapine  25 mg Oral QHS  . sertraline  200 mg Oral Daily  . sodium chloride flush  3 mL Intravenous Q12H  . traZODone  125 mg Oral QHS  . umeclidinium bromide  1 puff Inhalation Daily   Continuous Infusions: . azithromycin 500 mg (04/06/19 0605)  . cefTRIAXone (ROCEPHIN)  IV 2 g (04/06/19 0452)  . diltiazem (CARDIZEM) infusion 5 mg/hr (04/06/19 0700)     LOS: 1 day   Assessment & Plan:   Principal Problem:   Sepsis due to pneumonia [facility]) Active Problems:   CAD (coronary artery disease)   Hypothyroidism   Neuropathic pain syndrome (non-herpetic)   Type II diabetes mellitus with neurological manifestations, uncontrolled ([place])   Hyperlipidemia with target LDL less than 100    Left spastic hemiplegia (HCC)   Atrial fibrillation with RVR (HCC)   Acute on chronic respiratory failure with hypoxia (HCC)   Acute on chronic hypoxic respiratory failure secondary to acute decompensated congestive heart failure, type unknown: She was last seen by her cardiologist in 2002.  No known history of CHF or at least none known to [place].  She was given a dose of Lasix yesterday and she is negative almost 400 cc so far.  Echo done but results pending.  Cardiology on board and we defer to them for further management.  Uses 4 to 6 L of oxygen at home.  Was requiring 15 L high flow nasal cannula yesterday and currently on 8 L only.  Looks comfortable.  COVID-19 tested negative however she remains on high suspicion so we will check once again with send out test.  We will also check d-dimer, ESR, ferritin and LDH.  If d-dimer elevated, will proceed with CT angiogram of the chest.  Sepsis secondary to community-acquired pneumonia: She did come in with leukocytosis, tachycardia and tachypnea and met sepsis criteria and was diagnosed with community-acquired pneumonia however her procalcitonin is unremarkable which creates a question whether she even has bacterial pneumonia.  Urine antigen for Legionella and streptococci are negative.  Checking respiratory viral panel.  Wonder if she was tachypneic, tachycardic due to CHF.  Will continue antibiotics for now and follow urine and sputum culture and make further decisions.  New onset atrial fibrillation with RVR: Remains on Cardizem drip and scheduled Lopressor 5 mg IV 6 times a day.  Rate is fairly controlled.  Lovenox per pharmacy.  Cardiology on board.  Defer management to them.  Type 2 diabetes mellitus with  polyneuropathy: Takes high-dose long-acting insulin at home which have been on hold and despite of that patient's blood sugar is fairly controlled so I will continue SSI for now.  Continue gabapentin  Essential hypertension: Blood pressure on the low  normal side.  She happens to be on Cardizem drip and scheduled IV Lopressor.  Will discontinue Imdur.  CAD: Status post stents in the past.  No signs of ACS currently.  Continue statin and aspirin.  History of CVA: Known left spastic hemiplegia.  Hypothyroidism: Continue Synthroid  Hyperlipidemia: Continue atorvastatin  Chronic mild depression: Continue Zoloft  DVT prophylaxis: Lovenox Code Status: DNR Family Communication: None present/requested.  Will reach out to her husband later today. Disposition Plan: Likely back to her skilled nursing facility once medically stable.   Time spent: 41 minutes   Darliss Cheney, MD [facility] Pager [phone]  If 7PM-7AM, please contact night-coverage [email or web address] Password [email or web address] 04/06/2019, 10:00 AM

## 2019-04-07 ENCOUNTER — Inpatient Hospital Stay (HOSPITAL_COMMUNITY): Payer: Medicare Other

## 2019-04-07 LAB — COMPREHENSIVE METABOLIC PANEL
ALT: 13 U/L (ref 0–44)
AST: 15 U/L (ref 15–41)
Albumin: 2.9 g/dL — ABNORMAL LOW (ref 3.5–5.0)
Alkaline Phosphatase: 75 U/L (ref 38–126)
Anion gap: 14 (ref 5–15)
BUN: 18 mg/dL (ref 8–23)
CO2: 39 mmol/L — ABNORMAL HIGH (ref 22–32)
Calcium: 9 mg/dL (ref 8.9–10.3)
Chloride: 90 mmol/L — ABNORMAL LOW (ref 98–111)
Creatinine, Ser: 0.66 mg/dL (ref 0.44–1.00)
GFR calc Af Amer: >60 mL/min (ref >60)
GFR calc non Af Amer: >60 mL/min (ref >60)
Glucose, Bld: 142 mg/dL — ABNORMAL HIGH (ref 70–99)
Potassium: 4 mmol/L (ref 3.5–5.1)
Sodium: 143 mmol/L (ref 135–145)
Total Bilirubin: 0.5 mg/dL (ref 0.3–1.2)
Total Protein: 6.6 g/dL (ref 6.5–8.1)

## 2019-04-07 LAB — NOVEL CORONAVIRUS, NAA (HOSP ORDER, SEND-OUT TO REF LAB; TAT 18-24 HRS): SARS-CoV-2, NAA: NOT DETECTED

## 2019-04-07 LAB — BLOOD GAS, ARTERIAL
Acid-Base Excess: 18.5 mmol/L — ABNORMAL HIGH (ref 0.0–2.0)
Bicarbonate: 44.5 mmol/L — ABNORMAL HIGH (ref 20.0–28.0)
Drawn by: 27553
O2 Content: 15 L/min
O2 Saturation: 88.3 %
Patient temperature: 98.6
pCO2 arterial: 74 mmHg (ref 32.0–48.0)
pH, Arterial: 7.396 (ref 7.350–7.450)
pO2, Arterial: 61.3 mmHg — ABNORMAL LOW (ref 83.0–108.0)

## 2019-04-07 LAB — CBC WITH DIFFERENTIAL/PLATELET
Abs Immature Granulocytes: 0.25 10*3/uL — ABNORMAL HIGH (ref 0.00–0.07)
Basophils Absolute: 0.1 10*3/uL (ref 0.0–0.1)
Basophils Relative: 0 %
Eosinophils Absolute: 0.1 10*3/uL (ref 0.0–0.5)
Eosinophils Relative: 0 %
HCT: 38.4 % (ref 36.0–46.0)
Hemoglobin: 10.9 g/dL — ABNORMAL LOW (ref 12.0–15.0)
Immature Granulocytes: 2 %
Lymphocytes Relative: 9 %
Lymphs Abs: 1.4 10*3/uL (ref 0.7–4.0)
MCH: 26.3 pg (ref 26.0–34.0)
MCHC: 28.4 g/dL — ABNORMAL LOW (ref 30.0–36.0)
MCV: 92.5 fL (ref 80.0–100.0)
Monocytes Absolute: 2.4 10*3/uL — ABNORMAL HIGH (ref 0.1–1.0)
Monocytes Relative: 16 %
Neutro Abs: 11.1 10*3/uL — ABNORMAL HIGH (ref 1.7–7.7)
Neutrophils Relative %: 73 %
Platelets: 295 10*3/uL (ref 150–400)
RBC: 4.15 MIL/uL (ref 3.87–5.11)
RDW: 17.9 % — ABNORMAL HIGH (ref 11.5–15.5)
WBC: 15.3 10*3/uL — ABNORMAL HIGH (ref 4.0–10.5)
nRBC: 0.1 % (ref 0.0–0.2)

## 2019-04-07 LAB — URINE CULTURE: Culture: 3000 — AB

## 2019-04-07 LAB — TROPONIN I (HIGH SENSITIVITY)
Troponin I (High Sensitivity): 11 ng/L (ref <18)
Troponin I (High Sensitivity): 14 ng/L (ref <18)
Troponin I (High Sensitivity): 15 ng/L (ref <18)

## 2019-04-07 LAB — GLUCOSE, CAPILLARY
Glucose-Capillary: 139 mg/dL — ABNORMAL HIGH (ref 70–99)
Glucose-Capillary: 140 mg/dL — ABNORMAL HIGH (ref 70–99)
Glucose-Capillary: 199 mg/dL — ABNORMAL HIGH (ref 70–99)
Glucose-Capillary: 174 mg/dL — ABNORMAL HIGH (ref 70–99)

## 2019-04-07 LAB — MAGNESIUM: Magnesium: 2 mg/dL (ref 1.7–2.4)

## 2019-04-07 MED ORDER — NALOXONE HCL 0.4 MG/ML IJ SOLN
0.4000 mg | INTRAMUSCULAR | Status: DC | PRN
  Administered 2019-04-07: 0.4 mg via INTRAVENOUS
  Filled 2019-04-07: qty 1

## 2019-04-07 MED ORDER — FUROSEMIDE 10 MG/ML IJ SOLN
80.0000 mg | Freq: Once | INTRAMUSCULAR | Status: AC
  Administered 2019-04-07: 80 mg via INTRAVENOUS
  Filled 2019-04-07: qty 8

## 2019-04-07 MED ORDER — ENOXAPARIN SODIUM 80 MG/0.8ML ~~LOC~~ SOLN
80.0000 mg | Freq: Two times a day (BID) | SUBCUTANEOUS | Status: DC
  Administered 2019-04-07 – 2019-04-10 (×5): 80 mg via SUBCUTANEOUS
  Filled 2019-04-07 (×5): qty 0.8

## 2019-04-07 NOTE — Progress Notes (Signed)
Progress Note  Patient Name: Susan [name] Date of Encounter: 04/07/2019  Primary Cardiologist: Harrington Challenger  Subjective   Did not obtain.   Inpatient Medications    Scheduled Meds:  atorvastatin  80 mg Oral q1800   baclofen  10 mg Oral TID   Chlorhexidine Gluconate Cloth  6 each Topical Q0600   diltiazem  30 mg Oral Q8H   divalproex  250 mg Oral q morning - 10a   enoxaparin (LOVENOX) injection  60 mg Subcutaneous Q12H   fluticasone  2 spray Each Nare q morning - 10a   gabapentin  1,200 mg Oral QHS   gabapentin  400 mg Oral BID AC   guaiFENesin  600 mg Oral BID   insulin aspart  0-20 Units Subcutaneous TID WC   insulin aspart  0-5 Units Subcutaneous QHS   levothyroxine  50 mcg Oral QAC breakfast   lidocaine  2 patch Transdermal Q24H   Melatonin  6 mg Oral QHS   metoprolol tartrate  25 mg Oral QID   mupirocin ointment  1 application Nasal BID   oxyCODONE  10 mg Oral TID   pantoprazole  40 mg Oral Daily   QUEtiapine  25 mg Oral QHS   sertraline  200 mg Oral Daily   sodium chloride flush  3 mL Intravenous Q12H   traZODone  125 mg Oral QHS   umeclidinium bromide  1 puff Inhalation Daily   Continuous Infusions:  azithromycin 500 mg (04/07/19 0503)   cefTRIAXone (ROCEPHIN)  IV 2 g (04/07/19 0417)   PRN Meds: acetaminophen **OR** acetaminophen, fentaNYL (SUBLIMAZE) injection, levalbuterol, naLOXone (NARCAN)  injection, ondansetron **OR** ondansetron (ZOFRAN) IV, promethazine   Vital Signs    Vitals:   04/07/19 0445 04/07/19 0500 04/07/19 0600 04/07/19 0700  BP: 124/82  118/70   Pulse: 94 89 89 100  Resp: 11 11 12 16   Temp:      TempSrc:      SpO2: 90% 93% 96% 90%  Weight:      Height:        Intake/Output Summary (Last 24 hours) at 04/07/2019 0726 Last data filed at 04/07/2019 0700 Gross per 24 hour  Intake 100.51 ml  Output 950 ml  Net -849.49 ml   Filed Weights   03/27/2019 0349 04/06/19 0508 04/07/19 0400  Weight: 64.9 kg 64 kg 82.4  kg    Physical Exam  No exam    Labs    Chemistry Recent Labs  Lab 04/11/2019 0433 04/06/19 0719  NA 141 141  K 4.8 4.1  CL 95* 91*  CO2 33* 37*  GLUCOSE 148* 161*  BUN 19 18  CREATININE 0.57 0.69  CALCIUM 8.9 8.7*  PROT 7.3  --   ALBUMIN 3.1*  --   AST 20  --   ALT 16  --   ALKPHOS 85  --   BILITOT 0.7  --   GFRNONAA >60 >60  GFRAA >60 >60  ANIONGAP 13 13     Hematology Recent Labs  Lab 04/09/2019 0433 04/06/19 0719  WBC 13.7* 13.0*  RBC 4.14 3.94  HGB 10.7* 10.3*  HCT 38.5 36.4  MCV 93.0 92.4  MCH 25.8* 26.1  MCHC 27.8* 28.3*  RDW 18.3* 18.2*  PLT 282 288    Cardiac EnzymesNo results for input(s): TROPONINI in the last 168 hours. No results for input(s): TROPIPOC in the last 168 hours.   BNP Recent Labs  Lab 03/13/2019 0433  BNP 501.2*  DDimer  Recent Labs  Lab 04/06/19 1224  DDIMER 7.71*     Radiology    Ct Angio Chest Pe W Or Wo Contrast  Result Date: 04/06/2019 CLINICAL DATA:  PE suspected, COVID-19 investigation EXAM: CT ANGIOGRAPHY CHEST WITH CONTRAST TECHNIQUE: Multidetector CT imaging of the chest was performed using the standard protocol during bolus administration of intravenous contrast. Multiplanar CT image reconstructions and MIPs were obtained to evaluate the vascular anatomy. CONTRAST:  185mL OMNIPAQUE IOHEXOL 350 MG/ML SOLN COMPARISON:  None. FINDINGS: Cardiovascular: Satisfactory opacification of the pulmonary arteries to the segmental level. No evidence of pulmonary embolism. Cardiomegaly. Three-vessel coronary artery calcifications and/or stents. Small pericardial effusion. Aortic valve calcifications. Aortic atherosclerosis. Mediastinum/Nodes: No enlarged mediastinal, hilar, or axillary lymph nodes. Thyroid gland, trachea, and esophagus demonstrate no significant findings. Lungs/Pleura: Extensive bilateral ground-glass and consolidative opacity, somewhat geographic in appearance. Moderate bilateral pleural effusions with associated  atelectasis or consolidation of the dependent lower lungs. Upper Abdomen: No acute abnormality. Musculoskeletal: No chest wall abnormality. No acute or significant osseous findings. Review of the MIP images confirms the above findings. IMPRESSION: 1.  Negative examination for pulmonary embolism. 2. Extensive bilateral ground-glass and consolidative opacity, somewhat geographic in appearance and consistent with multifocal infection, potentially including COVID-19. 3. Moderate bilateral pleural effusions with associated atelectasis or consolidation of the dependent lower lungs. 4. Cardiomegaly and coronary artery disease. Small pericardial effusion. 5.  Aortic atherosclerosis and aortic valve calcifications. Electronically Signed   By: Eddie Candle M.D.   On: 04/06/2019 17:11   Telemetry    04/07/2019 Atrial fibrillation with rates in the 90-100 range  - Personally Reviewed  ECG    03/19/2019 AF with RVR rate 155bpm with no acute ischemic changes  - Personally Reviewed  Cardiac Studies   Echocardiogram 04/06/2019:  1. The left ventricle has low normal systolic function, with an ejection fraction of 50-55%. The cavity size was normal. There is mildly increased left ventricular wall thickness. Left ventricular diastolic Doppler parameters are indeterminate in the  setting of atrial fibrillation.  2. The right ventricle has normal systolic function. The cavity was normal. There is no increase in right ventricular wall thickness. Right ventricular systolic pressure is moderately elevated with an estimated pressure of 48.9 mmHg.  3. The aortic valve is tricuspid. Mild calcification of the aortic valve. Moderate aortic annular calcification noted.  4. The mitral valve is grossly normal. Mild calcification of the mitral valve leaflet. There is mild mitral annular calcification present.  5. The tricuspid valve is grossly normal.  6. The aorta is normal in size and structure.  7. Probable prominent  epicardial fat pad.  Patient Profile     68 y.o. female with a hx of CAD, DM2, neuropathy, CVA with residual weakness and HTN who is being followed by Cardiology for the evaluation of atrial fibrillation  at the request of Dr Tamala Julian    Assessment & Plan    We will not be seeing her in person today due to high suspicion of Covid.   1.  New onset atrial fibrillation with RVR: HR in the 90-100 range>>remains in AF. Echocardiogram from 04/06/2019 with LVEF of 50 to 55% with intermediate left ventricular diastolic parameters and no valvular disease.  Moderate elevated RV pressures. -Currently on diltiazem p.o. 30 mg every 8 hours and Metoprolol 25 mg 4 times daily. I would consider increasing the diltiazem to 30 mg Q6 hours then consolidate to Cardizem CD 120 mg daily tomorrow if tolerate.  -Lovenox per  pharmacy 60 mg for [facility]. Will need long term anti-coagulation.  -[id number] =8  2.  Acute on chronic hypoxic respiratory failure: -Echocardiogram from 04/06/2019 with LVEF of 50 to 55% with intermediate left ventricular diastolic parameters and no valvular disease.  Moderately elevated RV pressures -Baseline COPD on 4 to 6 L home supplemental O2 -COVID negative x 2 however remains on airborne precautions with high suspicion>>wainting on send out testing   -On broad spectrum ABX -I&O, net negative 1.2L   -O2 saturations in the 60-90 range>concerning   3. History of CAD s/p PCI x5, followed remotely at [facility] (no records available): -Troponin 9>>>13 -Continue statin, ASA  4.  Hypertension: -Low normal, 118/70, 124/82, 100/67 -Continue metoprolol, diltiazem  We will be available for questions today. I have not seen her in person due to Covid testing that is pending with high suspicion of Covid.   Lauree Chandler 04/07/2019 11:02 AM

## 2019-04-07 NOTE — Progress Notes (Signed)
RT called for pt desat to 78%. Upon arrival pt on 100% NRB SPO2 94%. RR 12. Narcan ordered and given Pt more alert and RR increased to 18-20. SPO2 97%. ABG held at this time due to pt more alert and breathing improved. Pt pulling off mask. RN weaning pt back to HFNC.. RT will continue to monitor

## 2019-04-07 NOTE — Progress Notes (Addendum)
PROGRESS NOTE    Susan [name]  PFX:[id number] DOB: 1950-11-28 DOA: 03/18/2019 PCP: Lattie Corns, PA-C   Brief Narrative:  Susan [name] is a 68 y.o. female with medical history significant of HTN, HLD, CAD s/p stents, COPD on 4 to 6 L, CVA with residual weakness, hypothyroidism, and depression; who presented with complaints of shortness of breath over the last week.No previous history of having an irregular heartbeat and initially stated that she wanted to go home.  Associated symptoms of productive cough, subjective fevers, palpitations, and generalized malaise.  Denied having any active chest pain, nausea, vomiting, diaphoresis, or leg pain.  Records show patient had recently been started on Levaquin earlier this week.  EMS found the patient with O2 saturations into the 80s which she was placed on a nonrebreather.  ED Course: On admission to the emergency department patient was noted to be afebrile, pulse up to 150s in atrial fibrillation with RVR, blood pressures 101/58-134/83, and O2 saturations as low as 84% with improvement to upper 80s to low 90s on 15 L flow oxygen.  Labs revealed WBC 13.7 and hemoglobin 10.7.  Chest x-ray showing cardiomegaly with shallow lung inflation and small right pleural effusion.  Patient was started on Cardizem drip.  Admitted to hospitalist service and cardiology consulted.  Consultants:   Cardiology  Procedures:   None  Antimicrobials:   Rocephin and azithromycin started 03/21/2019   Subjective: Patient seen and examined.  She was completely alert and oriented and significantly improved compared to yesterday.  She was still on 15 L of high flow oxygen however she did not have any complaint of major shortness of breath but she did complain of some chest pain and abdominal pain.  She is on pain medications which have been on hold due to her lethargy secondary to opioid medications last night when she responded to Narcan.  Objective: Vitals:   04/07/19  1057 04/07/19 1200 04/07/19 1335 04/07/19 1340  BP: 137/71  111/63 119/78  Pulse: (!) 118   (!) 44  Resp: 20   17  Temp: 97.6 F (36.4 C)     TempSrc: Oral     SpO2: 93%   97%  Weight:  82.3 kg    Height:        Intake/Output Summary (Last 24 hours) at 04/07/2019 1412 Last data filed at 04/07/2019 1321 Gross per 24 hour  Intake 860.51 ml  Output 950 ml  Net -89.49 ml   Filed Weights   04/06/19 0508 04/07/19 0400 04/07/19 1200  Weight: 64 kg 82.4 kg 82.3 kg    Examination:  General exam: Appears calm and comfortable, morbidly obese Respiratory system: Diminished breath sounds bilaterally. Respiratory effort normal. Cardiovascular system: S1 & S2 heard, irregularly irregular rate and rhythm. No JVD, murmurs, rubs, gallops or clicks. No pedal edema. Gastrointestinal system: Abdomen is nondistended, soft and nontender. No organomegaly or masses felt. Normal bowel sounds heard. Central nervous system: Alert and oriented. No focal neurological deficits. Extremities: Symmetric 5 x 5 power. Skin: No rashes, lesions or ulcers Psychiatry: Judgement and insight appear poor. Mood & affect appropriate.    Data Reviewed: I have personally reviewed following labs and imaging studies  CBC: Recent Labs  Lab 04/03/2019 0433 04/06/19 0719 04/07/19 0730  WBC 13.7* 13.0* 15.3*  NEUTROABS 12.7*  --  11.1*  HGB 10.7* 10.3* 10.9*  HCT 38.5 36.4 38.4  MCV 93.0 92.4 92.5  PLT 282 288 329   Basic Metabolic Panel: Recent Labs  Lab  03/31/2019 0433 03/16/2019 1029 04/06/19 0719 04/07/19 0730  NA 141  --  141 143  K 4.8  --  4.1 4.0  CL 95*  --  91* 90*  CO2 33*  --  37* 39*  GLUCOSE 148*  --  161* 142*  BUN 19  --  18 18  CREATININE 0.57  --  0.69 0.66  CALCIUM 8.9  --  8.7* 9.0  MG  --  2.2 2.0 2.0   GFR: Estimated Creatinine Clearance: 68.4 mL/min (by C-G formula based on SCr of 0.66 mg/dL). Liver Function Tests: Recent Labs  Lab 03/31/2019 0433 04/07/19 0730  AST 20 15  ALT 16  13  ALKPHOS 85 75  BILITOT 0.7 0.5  PROT 7.3 6.6  ALBUMIN 3.1* 2.9*   No results for input(s): LIPASE, AMYLASE in the last 168 hours. No results for input(s): AMMONIA in the last 168 hours. Coagulation Profile: Recent Labs  Lab 03/16/2019 1029  INR 1.1   Cardiac Enzymes: No results for input(s): CKTOTAL, CKMB, CKMBINDEX, TROPONINI in the last 168 hours. BNP (last 3 results) No results for input(s): PROBNP in the last 8760 hours. HbA1C: Recent Labs    04/06/19 0719  HGBA1C 7.6*   CBG: Recent Labs  Lab 04/06/19 1159 04/06/19 1712 04/06/19 2129 04/07/19 0850 04/07/19 1151  GLUCAP 115* 243* 164* 139* 140*   Lipid Profile: No results for input(s): CHOL, HDL, LDLCALC, TRIG, CHOLHDL, LDLDIRECT in the last 72 hours. Thyroid Function Tests: Recent Labs    04/04/2019 0936  TSH 0.276*   Anemia Panel: Recent Labs    04/06/19 0719  FERRITIN 170  TIBC 305  IRON 58   Sepsis Labs: Recent Labs  Lab 03/25/2019 1029  PROCALCITON 0.14  LATICACIDVEN 1.4    Recent Results (from the past 240 hour(s))  SARS Coronavirus 2 (CEPHEID- Performed in [place] hospital lab), Hosp Order     Status: None   Collection Time: 04/01/2019  3:54 AM   Specimen: Nasopharyngeal Swab  Result Value Ref Range Status   SARS Coronavirus 2 NEGATIVE NEGATIVE Final    Comment: (NOTE) If result is NEGATIVE SARS-CoV-2 target nucleic acids are NOT DETECTED. The SARS-CoV-2 RNA is generally detectable in upper and lower  respiratory specimens during the acute phase of infection. The lowest  concentration of SARS-CoV-2 viral copies this assay can detect is 250  copies / mL. A negative result does not preclude SARS-CoV-2 infection  and should not be used as the sole basis for treatment or other  patient management decisions.  A negative result may occur with  improper specimen collection / handling, submission of specimen other  than nasopharyngeal swab, presence of viral mutation(s) within the  areas  targeted by this assay, and inadequate number of viral copies  (<250 copies / mL). A negative result must be combined with clinical  observations, patient history, and epidemiological information. If result is POSITIVE SARS-CoV-2 target nucleic acids are DETECTED. The SARS-CoV-2 RNA is generally detectable in upper and lower  respiratory specimens dur ing the acute phase of infection.  Positive  results are indicative of active infection with SARS-CoV-2.  Clinical  correlation with patient history and other diagnostic information is  necessary to determine patient infection status.  Positive results do  not rule out bacterial infection or co-infection with other viruses. If result is PRESUMPTIVE POSTIVE SARS-CoV-2 nucleic acids MAY BE PRESENT.   A presumptive positive result was obtained on the submitted specimen  and confirmed on repeat testing.  While 2019 novel coronavirus  (SARS-CoV-2) nucleic acids may be present in the submitted sample  additional confirmatory testing may be necessary for epidemiological  and / or clinical management purposes  to differentiate between  SARS-CoV-2 and other Sarbecovirus currently known to infect humans.  If clinically indicated additional testing with an alternate test  methodology [phone]) is advised. The SARS-CoV-2 RNA is generally  detectable in upper and lower respiratory sp ecimens during the acute  phase of infection. The expected result is Negative. Fact Sheet for Patients:  StrictlyIdeas.no Fact Sheet for Healthcare Providers: BankingDealers.co.za This test is not yet approved or cleared by the [place] FDA and has been authorized for detection and/or diagnosis of SARS-CoV-2 by FDA under an Emergency Use Authorization (EUA).  This EUA will remain in effect (meaning this test can be used) for the duration of the COVID-19 declaration under Section 564(b)(1) of the Act, 21 U.S.C. section  360bbb-3(b)(1), unless the authorization is terminated or revoked sooner. Performed at [facility], [place] [address] [place], [place] [address]   Blood Culture (routine x 2)     Status: None (Preliminary result)   Collection Time: 03/20/2019  4:07 AM   Specimen: BLOOD  Result Value Ref Range Status   Specimen Description BLOOD RIGHT WRIST  Final   Special Requests   Final    BOTTLES DRAWN AEROBIC AND ANAEROBIC Blood Culture results may not be optimal due to an excessive volume of blood received in culture bottles   Culture   Final    NO GROWTH 2 DAYS Performed at [facility], [place] [address], [place], [place] [address]    Report Status PENDING  Incomplete  Blood Culture (routine x 2)     Status: None (Preliminary result)   Collection Time: 03/16/2019  5:16 AM   Specimen: BLOOD RIGHT HAND  Result Value Ref Range Status   Specimen Description BLOOD RIGHT HAND  Final   Special Requests   Final    BOTTLES DRAWN AEROBIC AND ANAEROBIC Blood Culture adequate volume   Culture   Final    NO GROWTH 2 DAYS Performed at [facility], [place] [address] [place], [place] [address]    Report Status PENDING  Incomplete  Urine culture     Status: Abnormal   Collection Time: 03/27/2019  6:30 AM   Specimen: In/Out Cath Urine  Result Value Ref Range Status   Specimen Description IN/OUT CATH URINE  Final   Special Requests   Final    NONE Performed at [facility], [place] [address] [place], [place] [address]    Culture 3,000 COLONIES/mL ESCHERICHIA COLI (A)  Final   Report Status 04/07/2019 FINAL  Final   Organism ID, Bacteria ESCHERICHIA COLI (A)  Final      Susceptibility   Escherichia coli - MIC*    AMPICILLIN <=2 SENSITIVE Sensitive     CEFAZOLIN <=4 SENSITIVE Sensitive     CEFTRIAXONE <=1 SENSITIVE Sensitive     CIPROFLOXACIN >=4 RESISTANT Resistant     GENTAMICIN <=1 SENSITIVE Sensitive     IMIPENEM <=0.25 SENSITIVE Sensitive     NITROFURANTOIN <=16 SENSITIVE Sensitive      TRIMETH/SULFA <=20 SENSITIVE Sensitive     AMPICILLIN/SULBACTAM <=2 SENSITIVE Sensitive     PIP/TAZO <=4 SENSITIVE Sensitive     Extended ESBL NEGATIVE Sensitive     * 3,000 COLONIES/mL ESCHERICHIA COLI  MRSA PCR Screening     Status: Abnormal   Collection Time:  04/09/2019 11:12 PM   Specimen: Nasal Mucosa; Nasopharyngeal  Result Value Ref Range Status   MRSA by PCR POSITIVE (A) NEGATIVE Final    Comment:        The GeneXpert MRSA Assay (FDA approved for NASAL specimens only), is one component of a comprehensive MRSA colonization surveillance program. It is not intended to diagnose MRSA infection nor to guide or monitor treatment for MRSA infections. RESULT CALLED TO, READ BACK BY AND VERIFIED WITH: ZARSONA,R RN [id number] 04/06/2019 MITCHELL,L Performed at [place] [address] [place], [place] [address]   Respiratory Panel by PCR     Status: None   Collection Time: 04/06/19  8:51 AM   Specimen: Nasopharyngeal Swab; Respiratory  Result Value Ref Range Status   Adenovirus NOT DETECTED NOT DETECTED Final   Coronavirus 229E NOT DETECTED NOT DETECTED Final    Comment: (NOTE) The Coronavirus on the Respiratory Panel, DOES NOT test for the novel  Coronavirus (2019 nCoV)    Coronavirus HKU1 NOT DETECTED NOT DETECTED Final   Coronavirus NL63 NOT DETECTED NOT DETECTED Final   Coronavirus OC43 NOT DETECTED NOT DETECTED Final   Metapneumovirus NOT DETECTED NOT DETECTED Final   Rhinovirus / Enterovirus NOT DETECTED NOT DETECTED Final   Influenza A NOT DETECTED NOT DETECTED Final   Influenza B NOT DETECTED NOT DETECTED Final   Parainfluenza Virus 1 NOT DETECTED NOT DETECTED Final   Parainfluenza Virus 2 NOT DETECTED NOT DETECTED Final   Parainfluenza Virus 3 NOT DETECTED NOT DETECTED Final   Parainfluenza Virus 4 NOT DETECTED NOT DETECTED Final   Respiratory Syncytial Virus NOT DETECTED NOT DETECTED Final   Bordetella pertussis NOT DETECTED NOT DETECTED Final    Chlamydophila pneumoniae NOT DETECTED NOT DETECTED Final   Mycoplasma pneumoniae NOT DETECTED NOT DETECTED Final    Comment: Performed at [facility], [place]. [address] [place], [place] [address]  SARS Coronavirus 2 (CEPHEID- Performed in [place] hospital lab), Hosp Order     Status: None   Collection Time: 04/06/19  6:30 PM   Specimen: Nasopharyngeal Swab  Result Value Ref Range Status   SARS Coronavirus 2 NEGATIVE NEGATIVE Final    Comment: (NOTE) If result is NEGATIVE SARS-CoV-2 target nucleic acids are NOT DETECTED. The SARS-CoV-2 RNA is generally detectable in upper and lower  respiratory specimens during the acute phase of infection. The lowest  concentration of SARS-CoV-2 viral copies this assay can detect is 250  copies / mL. A negative result does not preclude SARS-CoV-2 infection  and should not be used as the sole basis for treatment or other  patient management decisions.  A negative result may occur with  improper specimen collection / handling, submission of specimen other  than nasopharyngeal swab, presence of viral mutation(s) within the  areas targeted by this assay, and inadequate number of viral copies  (<250 copies / mL). A negative result must be combined with clinical  observations, patient history, and epidemiological information. If result is POSITIVE SARS-CoV-2 target nucleic acids are DETECTED. The SARS-CoV-2 RNA is generally detectable in upper and lower  respiratory specimens dur ing the acute phase of infection.  Positive  results are indicative of active infection with SARS-CoV-2.  Clinical  correlation with patient history and other diagnostic information is  necessary to determine patient infection status.  Positive results do  not rule out bacterial infection or co-infection with other viruses. If result is PRESUMPTIVE POSTIVE SARS-CoV-2 nucleic acids MAY BE PRESENT.  A presumptive positive result was obtained on the submitted specimen  and  confirmed on repeat testing.  While 2019 novel coronavirus  (SARS-CoV-2) nucleic acids may be present in the submitted sample  additional confirmatory testing may be necessary for epidemiological  and / or clinical management purposes  to differentiate between  SARS-CoV-2 and other Sarbecovirus currently known to infect humans.  If clinically indicated additional testing with an alternate test  methodology [phone]) is advised. The SARS-CoV-2 RNA is generally  detectable in upper and lower respiratory sp ecimens during the acute  phase of infection. The expected result is Negative. Fact Sheet for Patients:  StrictlyIdeas.no Fact Sheet for Healthcare Providers: BankingDealers.co.za This test is not yet approved or cleared by the [place] FDA and has been authorized for detection and/or diagnosis of SARS-CoV-2 by FDA under an Emergency Use Authorization (EUA).  This EUA will remain in effect (meaning this test can be used) for the duration of the COVID-19 declaration under Section 564(b)(1) of the Act, 21 U.S.C. section 360bbb-3(b)(1), unless the authorization is terminated or revoked sooner. Performed at [facility], [place] [address], [place], [place] [address]       Radiology Studies: Ct Angio Chest Pe W Or Wo Contrast  Result Date: 04/06/2019 CLINICAL DATA:  PE suspected, COVID-19 investigation EXAM: CT ANGIOGRAPHY CHEST WITH CONTRAST TECHNIQUE: Multidetector CT imaging of the chest was performed using the standard protocol during bolus administration of intravenous contrast. Multiplanar CT image reconstructions and MIPs were obtained to evaluate the vascular anatomy. CONTRAST:  156m OMNIPAQUE IOHEXOL 350 MG/ML SOLN COMPARISON:  None. FINDINGS: Cardiovascular: Satisfactory opacification of the pulmonary arteries to the segmental level. No evidence of pulmonary embolism. Cardiomegaly. Three-vessel coronary artery calcifications  and/or stents. Small pericardial effusion. Aortic valve calcifications. Aortic atherosclerosis. Mediastinum/Nodes: No enlarged mediastinal, hilar, or axillary lymph nodes. Thyroid gland, trachea, and esophagus demonstrate no significant findings. Lungs/Pleura: Extensive bilateral ground-glass and consolidative opacity, somewhat geographic in appearance. Moderate bilateral pleural effusions with associated atelectasis or consolidation of the dependent lower lungs. Upper Abdomen: No acute abnormality. Musculoskeletal: No chest wall abnormality. No acute or significant osseous findings. Review of the MIP images confirms the above findings. IMPRESSION: 1.  Negative examination for pulmonary embolism. 2. Extensive bilateral ground-glass and consolidative opacity, somewhat geographic in appearance and consistent with multifocal infection, potentially including COVID-19. 3. Moderate bilateral pleural effusions with associated atelectasis or consolidation of the dependent lower lungs. 4. Cardiomegaly and coronary artery disease. Small pericardial effusion. 5.  Aortic atherosclerosis and aortic valve calcifications. Electronically Signed   By: AEddie CandleM.D.   On: 04/06/2019 17:11   Dg Chest Port 1 View  Result Date: 04/07/2019 CLINICAL DATA:  Chest pain EXAM: PORTABLE CHEST 1 VIEW COMPARISON:  03/14/2019 FINDINGS: Cardiomegaly. Atherosclerotic calcification of the thoracic aorta and coronary arteries. There are persistent bilateral airspace opacities most pronounced within the right upper lobe and bilateral lung bases. Probable small bilateral pleural effusions. No pneumothorax. IMPRESSION: Persistent multifocal airspace opacity with probable small bilateral pleural effusions. Findings not significantly changed compared to the prior study. Electronically Signed   By: NDavina PokeM.D.   On: 04/07/2019 12:53    Scheduled Meds: . atorvastatin  80 mg Oral q1800  . baclofen  10 mg Oral TID  . Chlorhexidine  Gluconate Cloth  6 each Topical Q0600  . diltiazem  30 mg Oral Q8H  . divalproex  250 mg Oral q morning - 10a  . enoxaparin (LOVENOX) injection  80 mg Subcutaneous  Q12H  . fluticasone  2 spray Each Nare q morning - 10a  . furosemide  80 mg Intravenous Once  . gabapentin  1,200 mg Oral QHS  . gabapentin  400 mg Oral BID AC  . guaiFENesin  600 mg Oral BID  . insulin aspart  0-20 Units Subcutaneous TID WC  . insulin aspart  0-5 Units Subcutaneous QHS  . levothyroxine  50 mcg Oral QAC breakfast  . lidocaine  2 patch Transdermal Q24H  . Melatonin  6 mg Oral QHS  . metoprolol tartrate  25 mg Oral QID  . mupirocin ointment  1 application Nasal BID  . oxyCODONE  10 mg Oral TID  . pantoprazole  40 mg Oral Daily  . QUEtiapine  25 mg Oral QHS  . sertraline  200 mg Oral Daily  . sodium chloride flush  3 mL Intravenous Q12H  . traZODone  125 mg Oral QHS  . umeclidinium bromide  1 puff Inhalation Daily   Continuous Infusions: . azithromycin 500 mg (04/07/19 0503)  . cefTRIAXone (ROCEPHIN)  IV 2 g (04/07/19 0417)     LOS: 2 days   Assessment & Plan:   Principal Problem:   Sepsis due to pneumonia [facility]) Active Problems:   CAD (coronary artery disease)   Hypothyroidism   Neuropathic pain syndrome (non-herpetic)   Type II diabetes mellitus with neurological manifestations, uncontrolled ([place])   Hyperlipidemia with target LDL less than 100   Left spastic hemiplegia (HCC)   Atrial fibrillation with RVR (HCC)   Acute on chronic respiratory failure with hypoxia (HCC)   Acute on chronic hypoxic respiratory failure secondary to acute decompensated congestive heart failure, diastolic type. She was last seen by her cardiologist in 2002.  No known history of CHF or at least none known to [place].  Echo shows 55% ejection fraction with some diastolic dysfunction.  Chest x-ray shows multifocal airspace opacities with some pulmonary edema.  We will give her a dose of Lasix 80 mg IV.  Cardiology on board and  we defer to them for further management.  Uses 4 to 6 L of oxygen at home however she is on 15 L of high flow nasal cannula at this point in time but despite of that she looks comfortable.  COVID-19 rapid tested negative x2 however she remains on high suspicion. send out test result pending.  All inflammatory markers are elevated including d-dimer, ESR, CRP and LDH.  Sepsis secondary to community-acquired pneumonia vs viral pneumonia vs COVID-19 infection: She did come in with leukocytosis, tachycardia and tachypnea and met sepsis criteria and was diagnosed with community-acquired pneumonia however her procalcitonin is unremarkable which creates a question whether she even has bacterial pneumonia.  Urine antigen for Legionella and streptococci are negative.  respiratory viral panel negative as well.   New onset atrial fibrillation with RVR: On oral diltiazem and metoprolol.  Cardiology on board.  Defer management to them.    Type 2 diabetes mellitus with polyneuropathy: Takes high-dose long-acting insulin at home which have been on hold and despite of that patient's blood sugar is fairly controlled so I will continue SSI for now.  Continue gabapentin  Essential hypertension: Controlled.  Imdur was discontinued yesterday..  CAD: Status post stents in the past.  Complaining of chest pain.  Wondering if this is her chronic pain.  Will check EKG and cardiac enzymes.  Continue statin and aspirin.  History of CVA: Known left spastic hemiplegia.  Hypothyroidism: Continue Synthroid  Hyperlipidemia: Continue atorvastatin  Chronic mild depression: Continue Zoloft  DVT prophylaxis: Lovenox Code Status: DNR Family Communication: None present/requested.  Disposition Plan: Likely back to her skilled nursing facility once medically stable.   Time spent: 39 minutes   Darliss Cheney, MD [facility] Pager [phone]  If 7PM-7AM, please contact night-coverage [email or web address] Password [email or web address]  04/07/2019, 2:12 PM

## 2019-04-07 NOTE — Progress Notes (Signed)
Pt observed more drowsy; responsive to name; desats to 78-80's on HFNC @ 15 L.. BP= 124/82; HR= 90. Changed HFNL to 100% NRB. CN & RT called. PA Bodenheimer informed. Narcan 0.4 mg IV given as ordered. Pt is more alert & responsive  but keeps on pulling NRB mask. Placed back to HFNC @ 15 L. Will continue to monitor pt.

## 2019-04-07 NOTE — Progress Notes (Signed)
ANTICOAGULATION CONSULT NOTE - Initial Consult  Pharmacy Consult for lovenox Indication: atrial fibrillation  Allergies  Allergen Reactions  . Onion Shortness Of Breath and Swelling  . Pollen Extract Itching  . Morphine And Related Hives    PT DENIES ALLERGY  . Niaspan [Niacin Er] Other (See Comments)    Unknown reaction  . Trazodone And Nefazodone Other (See Comments)    Hallucinations (currently being given at [facility] 03/17/2019)  . Valium [Diazepam] Other (See Comments)    Unknown reaction  . Wellbutrin [Bupropion] Other (See Comments)    Unknown reaction    Patient Measurements: Height: 5\' 3"  (160 cm) Weight: 181 lb 10.5 oz (82.4 kg) IBW/kg (Calculated) : 52.4  Vital Signs: Temp: 97.6 F (36.4 C) (07/27 1057) Temp Source: Oral (07/27 1057) BP: 137/71 (07/27 1057) Pulse Rate: 118 (07/27 1057)  Labs: Recent Labs    04/09/2019 0433 03/30/2019 1029 03/20/2019 1218 04/06/19 0719 04/07/19 0730  HGB 10.7*  --   --  10.3* 10.9*  HCT 38.5  --   --  36.4 38.4  PLT 282  --   --  288 295  APTT  --  35  --   --   --   LABPROT  --  14.5  --   --   --   INR  --  1.1  --   --   --   CREATININE 0.57  --   --  0.69 0.66  TROPONINIHS  --  9 13  --   --     Estimated Creatinine Clearance: 68.4 mL/min (by C-G formula based on SCr of 0.66 mg/dL).   Medical History: Past Medical History:  Diagnosis Date  . Anemia   . CAD (coronary artery disease)   . Chronic pain   . COPD (chronic obstructive pulmonary disease) ([place])   . Depression   . Diabetes mellitus without complication ([place])   . Fall   . Hemiplegia affecting non-dominant side, post-stroke   . Hyperlipidemia   . Hypertension   . Stroke ([place]) 12/2013   left side paralysis  . Thyroid disease   . Urine retention    Assessment: 42 yof presented to the ED in afib. To start lovenox for anticoagulation. She is not on any anticoagulation PTA. Baseline Hgb is low but platelets are WNL. No bleeding noted.   The patient's weight  was updated today by the RN and reflected a 40 lb (20 kg) weight change. Confirmed this change with the RN as accurate - will adjust the lovenox dose accordingly and schedule the next dose earlier.   Goal of Therapy:  Anti-Xa level 0.6-1 units/ml 4hrs after LMWH dose given Monitor platelets by anticoagulation protocol: Yes   Plan:  - Adjust Lovenox to 80 mg SQ every 12 hours - Will continue to watch weight documentation and changes in renal function for necessary dose adjustments  Thank you for allowing pharmacy to be a part of this patient's care.  Alycia Rossetti, PharmD, BCPS Clinical Pharmacist Clinical phone for 04/07/2019: [phone] 04/07/2019 12:10 PM   **Pharmacist phone directory can now be found on [email or web address] ([email or web address]).  Listed under [place].

## 2019-04-07 NOTE — Progress Notes (Signed)
Pt being transferred to [facility] until negative return of send out covid test. Pt has taking oxygen of frequently this am and sat's have gotten as low as 58%. Returns to 87-92% when she leaves on. Pt frequently coughing. Explanation given to pt of move. Spoke with pt's sister and made her aware of transfer. She stated she would notified rest of family. Report called to [employer]. Pt transferring to [facility]. Carroll Kinds RN

## 2019-04-08 DIAGNOSIS — J9621 Acute and chronic respiratory failure with hypoxia: Secondary | ICD-10-CM

## 2019-04-08 LAB — BLOOD GAS, ARTERIAL
Acid-Base Excess: 13.8 mmol/L — ABNORMAL HIGH (ref 0.0–2.0)
Bicarbonate: 38.7 mmol/L — ABNORMAL HIGH (ref 20.0–28.0)
Drawn by: 427831
FIO2: 1
O2 Saturation: 86 %
Patient temperature: 98.6
pCO2 arterial: 57.2 mmHg — ABNORMAL HIGH (ref 32.0–48.0)
pH, Arterial: 7.446 (ref 7.350–7.450)
pO2, Arterial: 55.4 mmHg — ABNORMAL LOW (ref 83.0–108.0)

## 2019-04-08 LAB — COMPREHENSIVE METABOLIC PANEL
ALT: 16 U/L (ref 0–44)
AST: 21 U/L (ref 15–41)
Albumin: 3.1 g/dL — ABNORMAL LOW (ref 3.5–5.0)
Alkaline Phosphatase: 80 U/L (ref 38–126)
Anion gap: 16 — ABNORMAL HIGH (ref 5–15)
BUN: 15 mg/dL (ref 8–23)
CO2: 34 mmol/L — ABNORMAL HIGH (ref 22–32)
Calcium: 9 mg/dL (ref 8.9–10.3)
Chloride: 89 mmol/L — ABNORMAL LOW (ref 98–111)
Creatinine, Ser: 0.71 mg/dL (ref 0.44–1.00)
GFR calc Af Amer: >60 mL/min (ref >60)
GFR calc non Af Amer: >60 mL/min (ref >60)
Glucose, Bld: 163 mg/dL — ABNORMAL HIGH (ref 70–99)
Potassium: 4.4 mmol/L (ref 3.5–5.1)
Sodium: 139 mmol/L (ref 135–145)
Total Bilirubin: 0.7 mg/dL (ref 0.3–1.2)
Total Protein: 7.2 g/dL (ref 6.5–8.1)

## 2019-04-08 LAB — GLUCOSE, CAPILLARY
Glucose-Capillary: 177 mg/dL — ABNORMAL HIGH (ref 70–99)
Glucose-Capillary: 148 mg/dL — ABNORMAL HIGH (ref 70–99)
Glucose-Capillary: 190 mg/dL — ABNORMAL HIGH (ref 70–99)
Glucose-Capillary: 178 mg/dL — ABNORMAL HIGH (ref 70–99)

## 2019-04-08 LAB — CBC WITH DIFFERENTIAL/PLATELET
Abs Immature Granulocytes: 0.34 10*3/uL — ABNORMAL HIGH (ref 0.00–0.07)
Basophils Absolute: 0.1 10*3/uL (ref 0.0–0.1)
Basophils Relative: 0 %
Eosinophils Absolute: 0.1 10*3/uL (ref 0.0–0.5)
Eosinophils Relative: 0 %
HCT: 43.7 % (ref 36.0–46.0)
Hemoglobin: 12.5 g/dL (ref 12.0–15.0)
Immature Granulocytes: 2 %
Lymphocytes Relative: 8 %
Lymphs Abs: 1.5 10*3/uL (ref 0.7–4.0)
MCH: 25.5 pg — ABNORMAL LOW (ref 26.0–34.0)
MCHC: 28.6 g/dL — ABNORMAL LOW (ref 30.0–36.0)
MCV: 89 fL (ref 80.0–100.0)
Monocytes Absolute: 1.8 10*3/uL — ABNORMAL HIGH (ref 0.1–1.0)
Monocytes Relative: 10 %
Neutro Abs: 15 10*3/uL — ABNORMAL HIGH (ref 1.7–7.7)
Neutrophils Relative %: 80 %
Platelets: 324 10*3/uL (ref 150–400)
RBC: 4.91 MIL/uL (ref 3.87–5.11)
RDW: 17.7 % — ABNORMAL HIGH (ref 11.5–15.5)
WBC: 18.7 10*3/uL — ABNORMAL HIGH (ref 4.0–10.5)
nRBC: 0.2 % (ref 0.0–0.2)

## 2019-04-08 MED ORDER — LEVOTHYROXINE SODIUM 100 MCG/5ML IV SOLN
25.0000 ug | Freq: Every day | INTRAVENOUS | Status: DC
  Administered 2019-04-09 – 2019-04-10 (×2): 25 ug via INTRAVENOUS
  Filled 2019-04-08 (×3): qty 5

## 2019-04-08 MED ORDER — HALOPERIDOL LACTATE 5 MG/ML IJ SOLN
1.0000 mg | Freq: Once | INTRAMUSCULAR | Status: AC
  Administered 2019-04-08: 1 mg via INTRAVENOUS
  Filled 2019-04-08: qty 1

## 2019-04-08 MED ORDER — METOPROLOL TARTRATE 5 MG/5ML IV SOLN
5.0000 mg | Freq: Four times a day (QID) | INTRAVENOUS | Status: DC
  Administered 2019-04-08 – 2019-04-09 (×3): 5 mg via INTRAVENOUS
  Filled 2019-04-08 (×3): qty 5

## 2019-04-08 MED ORDER — SODIUM CHLORIDE 0.9 % IV SOLN
1.0000 g | Freq: Three times a day (TID) | INTRAVENOUS | Status: DC
  Administered 2019-04-08 – 2019-04-09 (×4): 1 g via INTRAVENOUS
  Filled 2019-04-08 (×6): qty 1

## 2019-04-08 MED ORDER — FUROSEMIDE 10 MG/ML IJ SOLN
80.0000 mg | Freq: Once | INTRAMUSCULAR | Status: AC
  Administered 2019-04-08: 80 mg via INTRAVENOUS
  Filled 2019-04-08: qty 8

## 2019-04-08 MED ORDER — IPRATROPIUM-ALBUTEROL 0.5-2.5 (3) MG/3ML IN SOLN
3.0000 mL | Freq: Four times a day (QID) | RESPIRATORY_TRACT | Status: DC
  Administered 2019-04-09 – 2019-04-10 (×7): 3 mL via RESPIRATORY_TRACT
  Filled 2019-04-08 (×6): qty 3

## 2019-04-08 MED ORDER — IPRATROPIUM-ALBUTEROL 20-100 MCG/ACT IN AERS: 1.0000 | INHALATION_SPRAY | RESPIRATORY_TRACT | Status: DC

## 2019-04-08 MED ORDER — IPRATROPIUM BROMIDE HFA 17 MCG/ACT IN AERS
2.0000 | INHALATION_SPRAY | RESPIRATORY_TRACT | Status: DC
  Filled 2019-04-08: qty 12.9

## 2019-04-08 MED ORDER — FENTANYL CITRATE (PF) 100 MCG/2ML IJ SOLN
12.5000 ug | INTRAMUSCULAR | Status: DC | PRN
  Administered 2019-04-08 – 2019-04-09 (×3): 12.5 ug via INTRAVENOUS
  Filled 2019-04-08 (×3): qty 2

## 2019-04-08 NOTE — Progress Notes (Signed)
Progress Note  Patient Name: Mayrin Schmuck Date of Encounter: 04/08/2019  Primary Cardiologist: Harrington Challenger  Subjective   Pt not responsive this am. Nursing at bedside. She is a DNR  Inpatient Medications    Scheduled Meds: . atorvastatin  80 mg Oral q1800  . baclofen  10 mg Oral TID  . Chlorhexidine Gluconate Cloth  6 each Topical Q0600  . diltiazem  30 mg Oral Q8H  . divalproex  250 mg Oral q morning - 10a  . enoxaparin (LOVENOX) injection  80 mg Subcutaneous Q12H  . fluticasone  2 spray Each Nare q morning - 10a  . gabapentin  1,200 mg Oral QHS  . gabapentin  400 mg Oral BID AC  . guaiFENesin  600 mg Oral BID  . insulin aspart  0-20 Units Subcutaneous TID WC  . insulin aspart  0-5 Units Subcutaneous QHS  . levothyroxine  50 mcg Oral QAC breakfast  . lidocaine  2 patch Transdermal Q24H  . Melatonin  6 mg Oral QHS  . metoprolol tartrate  25 mg Oral QID  . mupirocin ointment  1 application Nasal BID  . oxyCODONE  10 mg Oral TID  . pantoprazole  40 mg Oral Daily  . QUEtiapine  25 mg Oral QHS  . sertraline  200 mg Oral Daily  . sodium chloride flush  3 mL Intravenous Q12H  . traZODone  125 mg Oral QHS  . umeclidinium bromide  1 puff Inhalation Daily   Continuous Infusions: . azithromycin 500 mg (04/08/19 0528)  . ceFEPime (MAXIPIME) IV 1 g (04/08/19 0926)   PRN Meds: acetaminophen **OR** acetaminophen, fentaNYL (SUBLIMAZE) injection, levalbuterol, naLOXone (NARCAN)  injection, ondansetron **OR** ondansetron (ZOFRAN) IV, promethazine   Vital Signs    Vitals:   04/08/19 0523 04/08/19 0545 04/08/19 0629 04/08/19 0737  BP: (!) 143/74   (!) 151/61  Pulse:  (!) 107 (!) 108 86  Resp:  17 (!) 22 18  Temp:    99.3 F (37.4 C)  TempSrc:    Axillary  SpO2:  92% (!) 86% (!) 88%  Weight:      Height:        Intake/Output Summary (Last 24 hours) at 04/08/2019 0954 Last data filed at 04/08/2019 0654 Gross per 24 hour  Intake 1243 ml  Output 2250 ml  Net -1007 ml   Filed  Weights   04/07/19 0400 04/07/19 1200 04/08/19 0436  Weight: 82.4 kg 82.3 kg 82.1 kg    Physical Exam   General: Well developed, not responsive HEENT: OP clear, mucus membranes moist  SKIN: warm, dry. No rashes. Neuro: unable to obtain neuro exam Musculoskeletal: unable to obtain Psychiatric:Pt not responsive Neck: No JVD Lungs: Rhonci diffusely Cardiovascular: Regular rate and rhythm. No murmurs, gallops or rubs. Abdomen:Soft. Bowel sounds present. Non-tender.  Extremities: No lower extremity edema.   Labs    Chemistry Recent Labs  Lab 03/20/2019 0433 04/06/19 0719 04/07/19 0730 04/08/19 0627  NA 141 141 143 139  K 4.8 4.1 4.0 4.4  CL 95* 91* 90* 89*  CO2 33* 37* 39* 34*  GLUCOSE 148* 161* 142* 163*  BUN 19 18 18 15   CREATININE 0.57 0.69 0.66 0.71  CALCIUM 8.9 8.7* 9.0 9.0  PROT 7.3  --  6.6 7.2  ALBUMIN 3.1*  --  2.9* 3.1*  AST 20  --  15 21  ALT 16  --  13 16  ALKPHOS 85  --  75 80  BILITOT 0.7  --  0.5 0.7  GFRNONAA >60 >60 >60 >60  GFRAA >60 >60 >60 >60  ANIONGAP 13 13 14  16*     Hematology Recent Labs  Lab 04/06/19 0719 04/07/19 0730 04/08/19 0627  WBC 13.0* 15.3* 18.7*  RBC 3.94 4.15 4.91  HGB 10.3* 10.9* 12.5  HCT 36.4 38.4 43.7  MCV 92.4 92.5 89.0  MCH 26.1 26.3 25.5*  MCHC 28.3* 28.4* 28.6*  RDW 18.2* 17.9* 17.7*  PLT 288 295 324    Cardiac EnzymesNo results for input(s): TROPONINI in the last 168 hours. No results for input(s): TROPIPOC in the last 168 hours.   BNP Recent Labs  Lab 04/02/2019 0433  BNP 501.2*     DDimer  Recent Labs  Lab 04/06/19 1224  DDIMER 7.71*     Radiology    Ct Angio Chest Pe W Or Wo Contrast  Result Date: 04/06/2019 CLINICAL DATA:  PE suspected, COVID-19 investigation EXAM: CT ANGIOGRAPHY CHEST WITH CONTRAST TECHNIQUE: Multidetector CT imaging of the chest was performed using the standard protocol during bolus administration of intravenous contrast. Multiplanar CT image reconstructions and MIPs were  obtained to evaluate the vascular anatomy. CONTRAST:  172mL OMNIPAQUE IOHEXOL 350 MG/ML SOLN COMPARISON:  None. FINDINGS: Cardiovascular: Satisfactory opacification of the pulmonary arteries to the segmental level. No evidence of pulmonary embolism. Cardiomegaly. Three-vessel coronary artery calcifications and/or stents. Small pericardial effusion. Aortic valve calcifications. Aortic atherosclerosis. Mediastinum/Nodes: No enlarged mediastinal, hilar, or axillary lymph nodes. Thyroid gland, trachea, and esophagus demonstrate no significant findings. Lungs/Pleura: Extensive bilateral ground-glass and consolidative opacity, somewhat geographic in appearance. Moderate bilateral pleural effusions with associated atelectasis or consolidation of the dependent lower lungs. Upper Abdomen: No acute abnormality. Musculoskeletal: No chest wall abnormality. No acute or significant osseous findings. Review of the MIP images confirms the above findings. IMPRESSION: 1.  Negative examination for pulmonary embolism. 2. Extensive bilateral ground-glass and consolidative opacity, somewhat geographic in appearance and consistent with multifocal infection, potentially including COVID-19. 3. Moderate bilateral pleural effusions with associated atelectasis or consolidation of the dependent lower lungs. 4. Cardiomegaly and coronary artery disease. Small pericardial effusion. 5.  Aortic atherosclerosis and aortic valve calcifications. Electronically Signed   By: Eddie Candle M.D.   On: 04/06/2019 17:11   Dg Chest Port 1 View  Result Date: 04/07/2019 CLINICAL DATA:  Chest pain EXAM: PORTABLE CHEST 1 VIEW COMPARISON:  03/21/2019 FINDINGS: Cardiomegaly. Atherosclerotic calcification of the thoracic aorta and coronary arteries. There are persistent bilateral airspace opacities most pronounced within the right upper lobe and bilateral lung bases. Probable small bilateral pleural effusions. No pneumothorax. IMPRESSION: Persistent multifocal  airspace opacity with probable small bilateral pleural effusions. Findings not significantly changed compared to the prior study. Electronically Signed   By: Davina Poke M.D.   On: 04/07/2019 12:53   Telemetry    04/07/2019 Atrial fibrillation with rates in the 90-100 range  - Personally Reviewed  ECG    04/04/2019 AF with RVR rate 155bpm with no acute ischemic changes  - Personally Reviewed  Cardiac Studies   Echocardiogram 04/06/2019:  1. The left ventricle has low normal systolic function, with an ejection fraction of 50-55%. The cavity size was normal. There is mildly increased left ventricular wall thickness. Left ventricular diastolic Doppler parameters are indeterminate in the  setting of atrial fibrillation.  2. The right ventricle has normal systolic function. The cavity was normal. There is no increase in right ventricular wall thickness. Right ventricular systolic pressure is moderately elevated with an estimated pressure of 48.9 mmHg.  3. The aortic valve is tricuspid. Mild calcification of the aortic valve. Moderate aortic annular calcification noted.  4. The mitral valve is grossly normal. Mild calcification of the mitral valve leaflet. There is mild mitral annular calcification present.  5. The tricuspid valve is grossly normal.  6. The aorta is normal in size and structure.  7. Probable prominent epicardial fat pad.  Patient Profile     68 y.o. female with a hx of CAD, DM2, neuropathy, CVA with residual weakness and HTN who is being followed by Cardiology for the evaluation of atrial fibrillation   Assessment & Plan    1.  New onset atrial fibrillation with RVR: She is in sinus this am. Echocardiogram from 04/06/2019 with LVEF of 50 to 55% with intermediate left ventricular diastolic parameters and no valvular disease.  She is doing poorly this am from a respiratory standpoint. She is not responsive at this time. If there is not a move to comfort care, would convert  Diltiazem to Cardizem CD 120 mg daly. Convert metoprolol to 50 mg po BID. Lovenox per pharmacy 60 mg for [facility]. Will need long term anti-coagulation. -[id number] =8  2.  Acute on chronic hypoxic respiratory failure: Echocardiogram from 04/06/2019 with LVEF of 50 to 55% with intermediate left ventricular diastolic parameters and no valvular disease.  Moderately elevated RV pressures. Baseline COPD on 4 to 6 L home supplemental O2. COVID negative x 3 however remains on airborne precautions with high suspicion>>waiting on send out testing. She is not responsive this morning. She is on 15 L. Does not appear to be volume overloaded. She has been diuresing. Now negative 2.3 liters since admission. OK to continue with IV Lasix today.   3. History of CAD s/p PCI x5, followed remotely at [facility] (no records available):  Negative troponin. No chest pain. Continue statin, ASA  Lauree Chandler 04/08/2019 9:54 AM

## 2019-04-08 NOTE — Progress Notes (Signed)
Pt was restless and agitated all night long and continued to try and take HFNC out of nose and pt O2 saturations dropped as low as 65%. Pt after dropping O2 saturations would take awhile to recover. Pt O2 saturations unable to get into the high 80s without HFNC and Non-rebreather.

## 2019-04-08 NOTE — Progress Notes (Signed)
PROGRESS NOTE    Susan [name]  WGY:[id number] DOB: [birth year] DOA: 03/31/2019 PCP: Lattie Corns, PA-C   Brief Narrative:  Susan [name] is a 68 y.o. female with medical history significant of HTN, HLD, CAD s/p stents, COPD on 4 to 6 L, CVA with residual weakness, hypothyroidism, and depression; who presented with complaints of shortness of breath over the last week.No previous history of having an irregular heartbeat and initially stated that she wanted to go home.  Associated symptoms of productive cough, subjective fevers, palpitations, and generalized malaise.  Denied having any active chest pain, nausea, vomiting, diaphoresis, or leg pain.  Records show patient had recently been started on Levaquin earlier this week.  EMS found the patient with O2 saturations into the 80s which she was placed on a nonrebreather.  ED Course: On admission to the emergency department patient was noted to be afebrile, pulse up to 150s in atrial fibrillation with RVR, blood pressures 101/58-134/83, and O2 saturations as low as 84% with improvement to upper 80s to low 90s on 15 L flow oxygen.  Labs revealed WBC 13.7 and hemoglobin 10.7.  Chest x-ray showing cardiomegaly with shallow lung inflation and small right pleural effusion.  Patient was started on Cardizem drip.  Admitted to hospitalist service and cardiology consulted.  Patient is being treated for community-acquired pneumonia.  She had elevated d-dimer so CT angiogram of the chest was done and she was ruled out of PE.  This showed groundglass opacities bilaterally with diffuse bilateral infiltrates.  Procalcitonin is unremarkable however all other inflammatory markers including d-dimer, ferritin, LDH, ESR and CRP were elevated raising suspicion for COVID-19 so she was tested twice with a rapid test and once with send out test and all 3 of them are negative.  She remains PUI.  On the morning of 04/08/2019, she became more lethargic.  I was called at the bedside to  assess her urgently.  She was on nonrebreather 100%.  She was hard to arouse with a sternal rub however seem to be protecting her airway.  Stat ABG was done which showed 57 PCO2 and hypoxia.  Slightly better than yesterday.  PCCM was consulted urgently.  Consultants:   Cardiology  PCCM consulted 04/08/2019  Procedures:   None  Antimicrobials:   Rocephin and azithromycin started 04/01/2019.  Switched from Rocephin to cefepime on 04/08/2019   Subjective: I was paged by RN to come to the bedside urgently due to patient's lethargy and obtundation.  Patient was seen and examined within few minutes.  Patient was hard to arouse even with sternal rub however she seemed to be protecting her airway.  She was on 100% nonrebreather.  Objective: Vitals:   04/08/19 0545 04/08/19 0629 04/08/19 0737 04/08/19 1036  BP:   (!) 151/61 (!) 116/55  Pulse: (!) 107 (!) 108 86 93  Resp: 17 (!) 22 18 (!) 21  Temp:   99.3 F (37.4 C) 99.1 F (37.3 C)  TempSrc:   Axillary Axillary  SpO2: 92% (!) 86% (!) 88% 94%  Weight:      Height:        Intake/Output Summary (Last 24 hours) at 04/08/2019 1056 Last data filed at 04/08/2019 0654 Gross per 24 hour  Intake 1243 ml  Output 2250 ml  Net -1007 ml   Filed Weights   04/07/19 0400 04/07/19 1200 04/08/19 0436  Weight: 82.4 kg 82.3 kg 82.1 kg    Examination:  General exam: Appears obtunded on 100% nonrebreather, morbidly obese Respiratory  system: Coarse breath sounds anteriorly, diminished breath sounds at the bases bilaterally.  Using accessory muscles. Cardiovascular system: S1 & S2 heard, RRR. No JVD, murmurs, rubs, gallops or clicks. No pedal edema. Gastrointestinal system: Abdomen is nondistended, soft and nontender. No organomegaly or masses felt. Normal bowel sounds heard. Central nervous system: Lethargic Extremities: Symmetric 5 x 5 power. Skin: No rashes, lesions or ulcers Psychiatry: Too lethargic to be assessed  Data Reviewed: I have  personally reviewed following labs and imaging studies  CBC: Recent Labs  Lab 03/25/2019 0433 04/06/19 0719 04/07/19 0730 04/08/19 0627  WBC 13.7* 13.0* 15.3* 18.7*  NEUTROABS 12.7*  --  11.1* 15.0*  HGB 10.7* 10.3* 10.9* 12.5  HCT 38.5 36.4 38.4 43.7  MCV 93.0 92.4 92.5 89.0  PLT 282 288 295 248   Basic Metabolic Panel: Recent Labs  Lab 03/29/2019 0433 03/28/2019 1029 04/06/19 0719 04/07/19 0730 04/08/19 0627  NA 141  --  141 143 139  K 4.8  --  4.1 4.0 4.4  CL 95*  --  91* 90* 89*  CO2 33*  --  37* 39* 34*  GLUCOSE 148*  --  161* 142* 163*  BUN 19  --  18 18 15   CREATININE 0.57  --  0.69 0.66 0.71  CALCIUM 8.9  --  8.7* 9.0 9.0  MG  --  2.2 2.0 2.0  --    GFR: Estimated Creatinine Clearance: 68.3 mL/min (by C-G formula based on SCr of 0.71 mg/dL). Liver Function Tests: Recent Labs  Lab 03/21/2019 0433 04/07/19 0730 04/08/19 0627  AST 20 15 21   ALT 16 13 16   ALKPHOS 85 75 80  BILITOT 0.7 0.5 0.7  PROT 7.3 6.6 7.2  ALBUMIN 3.1* 2.9* 3.1*   No results for input(s): LIPASE, AMYLASE in the last 168 hours. No results for input(s): AMMONIA in the last 168 hours. Coagulation Profile: Recent Labs  Lab 03/15/2019 1029  INR 1.1   Cardiac Enzymes: No results for input(s): CKTOTAL, CKMB, CKMBINDEX, TROPONINI in the last 168 hours. BNP (last 3 results) No results for input(s): PROBNP in the last 8760 hours. HbA1C: Recent Labs    04/06/19 0719  HGBA1C 7.6*   CBG: Recent Labs  Lab 04/07/19 0850 04/07/19 1151 04/07/19 1634 04/07/19 2043 04/08/19 0749  GLUCAP 139* 140* 199* 174* 177*   Lipid Profile: No results for input(s): CHOL, HDL, LDLCALC, TRIG, CHOLHDL, LDLDIRECT in the last 72 hours. Thyroid Function Tests: No results for input(s): TSH, T4TOTAL, FREET4, T3FREE, THYROIDAB in the last 72 hours. Anemia Panel: Recent Labs    04/06/19 0719  FERRITIN 170  TIBC 305  IRON 58   Sepsis Labs: Recent Labs  Lab 03/21/2019 1029  PROCALCITON 0.14    LATICACIDVEN 1.4    Recent Results (from the past 240 hour(s))  SARS Coronavirus 2 (CEPHEID- Performed in [place] hospital lab), Hosp Order     Status: None   Collection Time: 03/12/2019  3:54 AM   Specimen: Nasopharyngeal Swab  Result Value Ref Range Status   SARS Coronavirus 2 NEGATIVE NEGATIVE Final    Comment: (NOTE) If result is NEGATIVE SARS-CoV-2 target nucleic acids are NOT DETECTED. The SARS-CoV-2 RNA is generally detectable in upper and lower  respiratory specimens during the acute phase of infection. The lowest  concentration of SARS-CoV-2 viral copies this assay can detect is 250  copies / mL. A negative result does not preclude SARS-CoV-2 infection  and should not be used as the sole basis for treatment  or other  patient management decisions.  A negative result may occur with  improper specimen collection / handling, submission of specimen other  than nasopharyngeal swab, presence of viral mutation(s) within the  areas targeted by this assay, and inadequate number of viral copies  (<250 copies / mL). A negative result must be combined with clinical  observations, patient history, and epidemiological information. If result is POSITIVE SARS-CoV-2 target nucleic acids are DETECTED. The SARS-CoV-2 RNA is generally detectable in upper and lower  respiratory specimens dur ing the acute phase of infection.  Positive  results are indicative of active infection with SARS-CoV-2.  Clinical  correlation with patient history and other diagnostic information is  necessary to determine patient infection status.  Positive results do  not rule out bacterial infection or co-infection with other viruses. If result is PRESUMPTIVE POSTIVE SARS-CoV-2 nucleic acids MAY BE PRESENT.   A presumptive positive result was obtained on the submitted specimen  and confirmed on repeat testing.  While 2019 novel coronavirus  (SARS-CoV-2) nucleic acids may be present in the submitted sample   additional confirmatory testing may be necessary for epidemiological  and / or clinical management purposes  to differentiate between  SARS-CoV-2 and other Sarbecovirus currently known to infect humans.  If clinically indicated additional testing with an alternate test  methodology [phone]) is advised. The SARS-CoV-2 RNA is generally  detectable in upper and lower respiratory sp ecimens during the acute  phase of infection. The expected result is Negative. Fact Sheet for Patients:  StrictlyIdeas.no Fact Sheet for Healthcare Providers: BankingDealers.co.za This test is not yet approved or cleared by the [place] FDA and has been authorized for detection and/or diagnosis of SARS-CoV-2 by FDA under an Emergency Use Authorization (EUA).  This EUA will remain in effect (meaning this test can be used) for the duration of the COVID-19 declaration under Section 564(b)(1) of the Act, 21 U.S.C. section 360bbb-3(b)(1), unless the authorization is terminated or revoked sooner. Performed at [facility], [place] [address] [place], [place] [address]   Blood Culture (routine x 2)     Status: None (Preliminary result)   Collection Time: 04/04/2019  4:07 AM   Specimen: BLOOD  Result Value Ref Range Status   Specimen Description BLOOD RIGHT WRIST  Final   Special Requests   Final    BOTTLES DRAWN AEROBIC AND ANAEROBIC Blood Culture results may not be optimal due to an excessive volume of blood received in culture bottles   Culture   Final    NO GROWTH 3 DAYS Performed at [facility], [place] [address] [place], [place] [address]    Report Status PENDING  Incomplete  Blood Culture (routine x 2)     Status: None (Preliminary result)   Collection Time: 03/16/2019  5:16 AM   Specimen: BLOOD RIGHT HAND  Result Value Ref Range Status   Specimen Description BLOOD RIGHT HAND  Final   Special Requests   Final    BOTTLES DRAWN AEROBIC AND  ANAEROBIC Blood Culture adequate volume   Culture   Final    NO GROWTH 3 DAYS Performed at [facility], [place] [address], [place], [place] [address]    Report Status PENDING  Incomplete  Urine culture     Status: Abnormal   Collection Time: 03/22/2019  6:30 AM   Specimen: In/Out Cath Urine  Result Value Ref Range Status   Specimen Description IN/OUT CATH URINE  Final   Special Requests  Final    NONE Performed at [facility], [place] [address], [place], [place] [address]    Culture 3,000 COLONIES/mL ESCHERICHIA COLI (A)  Final   Report Status 04/07/2019 FINAL  Final   Organism ID, Bacteria ESCHERICHIA COLI (A)  Final      Susceptibility   Escherichia coli - MIC*    AMPICILLIN <=2 SENSITIVE Sensitive     CEFAZOLIN <=4 SENSITIVE Sensitive     CEFTRIAXONE <=1 SENSITIVE Sensitive     CIPROFLOXACIN >=4 RESISTANT Resistant     GENTAMICIN <=1 SENSITIVE Sensitive     IMIPENEM <=0.25 SENSITIVE Sensitive     NITROFURANTOIN <=16 SENSITIVE Sensitive     TRIMETH/SULFA <=20 SENSITIVE Sensitive     AMPICILLIN/SULBACTAM <=2 SENSITIVE Sensitive     PIP/TAZO <=4 SENSITIVE Sensitive     Extended ESBL NEGATIVE Sensitive     * 3,000 COLONIES/mL ESCHERICHIA COLI  MRSA PCR Screening     Status: Abnormal   Collection Time: 03/21/2019 11:12 PM   Specimen: Nasal Mucosa; Nasopharyngeal  Result Value Ref Range Status   MRSA by PCR POSITIVE (A) NEGATIVE Final    Comment:        The GeneXpert MRSA Assay (FDA approved for NASAL specimens only), is one component of a comprehensive MRSA colonization surveillance program. It is not intended to diagnose MRSA infection nor to guide or monitor treatment for MRSA infections. RESULT CALLED TO, READ BACK BY AND VERIFIED WITH: ZARSONA,R RN [id number] 04/06/2019 MITCHELL,L Performed at [place] [address], [place], [place] [address]   Novel Coronavirus, NAA (hospital order; send-out to ref lab)     Status: None   Collection Time: 04/06/19   8:00 AM  Result Value Ref Range Status   SARS-CoV-2, NAA NOT DETECTED NOT DETECTED Final    Comment: (NOTE) This test was developed and its performance characteristics determined by [employer]. This test has not been FDA cleared or approved. This test has been authorized by FDA under an Emergency Use Authorization (EUA). This test is only authorized for the duration of time the declaration that circumstances exist justifying the authorization of the emergency use of in vitro diagnostic tests for detection of SARS-CoV-2 virus and/or diagnosis of COVID-19 infection under section 564(b)(1) of the Act, 21 U.S.C. 956LOV-5(I)(4), unless the authorization is terminated or revoked sooner. When diagnostic testing is negative, the possibility of a false negative result should be considered in the context of a patient's recent exposures and the presence of clinical signs and symptoms consistent with COVID-19. An individual without symptoms of COVID-19 and who is not shedding SARS-CoV-2 virus would expect to have a negative (not detected) result in this assay. Performed  At: [facility] [address] [place], [place] [id number] Rush Farmer MD ZY:[id number]    [place]  Final    Comment: Performed at [facility], [place] [address] [place], [place] [address]  Respiratory Panel by PCR     Status: None   Collection Time: 04/06/19  8:51 AM   Specimen: Nasopharyngeal Swab; Respiratory  Result Value Ref Range Status   Adenovirus NOT DETECTED NOT DETECTED Final   Coronavirus 229E NOT DETECTED NOT DETECTED Final    Comment: (NOTE) The Coronavirus on the Respiratory Panel, DOES NOT test for the novel  Coronavirus (2019 nCoV)    Coronavirus HKU1 NOT DETECTED NOT DETECTED Final   Coronavirus NL63 NOT DETECTED NOT DETECTED Final   Coronavirus OC43 NOT DETECTED NOT DETECTED Final  Metapneumovirus NOT DETECTED NOT DETECTED Final   Rhinovirus /  Enterovirus NOT DETECTED NOT DETECTED Final   Influenza A NOT DETECTED NOT DETECTED Final   Influenza B NOT DETECTED NOT DETECTED Final   Parainfluenza Virus 1 NOT DETECTED NOT DETECTED Final   Parainfluenza Virus 2 NOT DETECTED NOT DETECTED Final   Parainfluenza Virus 3 NOT DETECTED NOT DETECTED Final   Parainfluenza Virus 4 NOT DETECTED NOT DETECTED Final   Respiratory Syncytial Virus NOT DETECTED NOT DETECTED Final   Bordetella pertussis NOT DETECTED NOT DETECTED Final   Chlamydophila pneumoniae NOT DETECTED NOT DETECTED Final   Mycoplasma pneumoniae NOT DETECTED NOT DETECTED Final    Comment: Performed at [facility], [place] [address] [place], [place] [address]  SARS Coronavirus 2 (CEPHEID- Performed in [facility] hospital lab), Hosp Order     Status: None   Collection Time: 04/06/19  6:30 PM   Specimen: Nasopharyngeal Swab  Result Value Ref Range Status   SARS Coronavirus 2 NEGATIVE NEGATIVE Final    Comment: (NOTE) If result is NEGATIVE SARS-CoV-2 target nucleic acids are NOT DETECTED. The SARS-CoV-2 RNA is generally detectable in upper and lower  respiratory specimens during the acute phase of infection. The lowest  concentration of SARS-CoV-2 viral copies this assay can detect is 250  copies / mL. A negative result does not preclude SARS-CoV-2 infection  and should not be used as the sole basis for treatment or other  patient management decisions.  A negative result may occur with  improper specimen collection / handling, submission of specimen other  than nasopharyngeal swab, presence of viral mutation(s) within the  areas targeted by this assay, and inadequate number of viral copies  (<250 copies / mL). A negative result must be combined with clinical  observations, patient history, and epidemiological information. If result is POSITIVE SARS-CoV-2 target nucleic acids are DETECTED. The SARS-CoV-2 RNA is generally detectable in upper and lower  respiratory specimens  dur ing the acute phase of infection.  Positive  results are indicative of active infection with SARS-CoV-2.  Clinical  correlation with patient history and other diagnostic information is  necessary to determine patient infection status.  Positive results do  not rule out bacterial infection or co-infection with other viruses. If result is PRESUMPTIVE POSTIVE SARS-CoV-2 nucleic acids MAY BE PRESENT.   A presumptive positive result was obtained on the submitted specimen  and confirmed on repeat testing.  While 2019 novel coronavirus  (SARS-CoV-2) nucleic acids may be present in the submitted sample  additional confirmatory testing may be necessary for epidemiological  and / or clinical management purposes  to differentiate between  SARS-CoV-2 and other Sarbecovirus currently known to infect humans.  If clinically indicated additional testing with an alternate test  methodology [phone]) is advised. The SARS-CoV-2 RNA is generally  detectable in upper and lower respiratory sp ecimens during the acute  phase of infection. The expected result is Negative. Fact Sheet for Patients:  StrictlyIdeas.no Fact Sheet for Healthcare Providers: BankingDealers.co.za This test is not yet approved or cleared by the [place] FDA and has been authorized for detection and/or diagnosis of SARS-CoV-2 by FDA under an Emergency Use Authorization (EUA).  This EUA will remain in effect (meaning this test can be used) for the duration of the COVID-19 declaration under Section 564(b)(1) of the Act, 21 U.S.C. section 360bbb-3(b)(1), unless the authorization is terminated or revoked sooner. Performed at [facility], [place] [address] [place], [place] [address]  Radiology Studies: Ct Angio Chest Pe W Or Wo Contrast  Result Date: 04/06/2019 CLINICAL DATA:  PE suspected, COVID-19 investigation EXAM: CT ANGIOGRAPHY CHEST WITH CONTRAST TECHNIQUE:  Multidetector CT imaging of the chest was performed using the standard protocol during bolus administration of intravenous contrast. Multiplanar CT image reconstructions and MIPs were obtained to evaluate the vascular anatomy. CONTRAST:  166m OMNIPAQUE IOHEXOL 350 MG/ML SOLN COMPARISON:  None. FINDINGS: Cardiovascular: Satisfactory opacification of the pulmonary arteries to the segmental level. No evidence of pulmonary embolism. Cardiomegaly. Three-vessel coronary artery calcifications and/or stents. Small pericardial effusion. Aortic valve calcifications. Aortic atherosclerosis. Mediastinum/Nodes: No enlarged mediastinal, hilar, or axillary lymph nodes. Thyroid gland, trachea, and esophagus demonstrate no significant findings. Lungs/Pleura: Extensive bilateral ground-glass and consolidative opacity, somewhat geographic in appearance. Moderate bilateral pleural effusions with associated atelectasis or consolidation of the dependent lower lungs. Upper Abdomen: No acute abnormality. Musculoskeletal: No chest wall abnormality. No acute or significant osseous findings. Review of the MIP images confirms the above findings. IMPRESSION: 1.  Negative examination for pulmonary embolism. 2. Extensive bilateral ground-glass and consolidative opacity, somewhat geographic in appearance and consistent with multifocal infection, potentially including COVID-19. 3. Moderate bilateral pleural effusions with associated atelectasis or consolidation of the dependent lower lungs. 4. Cardiomegaly and coronary artery disease. Small pericardial effusion. 5.  Aortic atherosclerosis and aortic valve calcifications. Electronically Signed   By: AEddie CandleM.D.   On: 04/06/2019 17:11   Dg Chest Port 1 View  Result Date: 04/07/2019 CLINICAL DATA:  Chest pain EXAM: PORTABLE CHEST 1 VIEW COMPARISON:  03/12/2019 FINDINGS: Cardiomegaly. Atherosclerotic calcification of the thoracic aorta and coronary arteries. There are persistent bilateral  airspace opacities most pronounced within the right upper lobe and bilateral lung bases. Probable small bilateral pleural effusions. No pneumothorax. IMPRESSION: Persistent multifocal airspace opacity with probable small bilateral pleural effusions. Findings not significantly changed compared to the prior study. Electronically Signed   By: NDavina PokeM.D.   On: 04/07/2019 12:53    Scheduled Meds:  atorvastatin  80 mg Oral q1800   baclofen  10 mg Oral TID   Chlorhexidine Gluconate Cloth  6 each Topical Q0600   diltiazem  30 mg Oral Q8H   divalproex  250 mg Oral q morning - 10a   enoxaparin (LOVENOX) injection  80 mg Subcutaneous Q12H   fluticasone  2 spray Each Nare q morning - 10a   furosemide  80 mg Intravenous Once   guaiFENesin  600 mg Oral BID   insulin aspart  0-20 Units Subcutaneous TID WC   insulin aspart  0-5 Units Subcutaneous QHS   levothyroxine  50 mcg Oral QAC breakfast   lidocaine  2 patch Transdermal Q24H   Melatonin  6 mg Oral QHS   metoprolol tartrate  25 mg Oral QID   mupirocin ointment  1 application Nasal BID   oxyCODONE  10 mg Oral TID   pantoprazole  40 mg Oral Daily   QUEtiapine  25 mg Oral QHS   sertraline  200 mg Oral Daily   sodium chloride flush  3 mL Intravenous Q12H   umeclidinium bromide  1 puff Inhalation Daily   Continuous Infusions:  azithromycin 500 mg (04/08/19 0528)   ceFEPime (MAXIPIME) IV 1 g (04/08/19 0926)     LOS: 3 days   Assessment & Plan:   Principal Problem:   Sepsis due to pneumonia ([facility] Active Problems:   CAD (coronary artery disease)   Hypothyroidism   Neuropathic pain syndrome (non-herpetic)   Type  II diabetes mellitus with neurological manifestations, uncontrolled ([facility])   Hyperlipidemia with target LDL less than 100   Left spastic hemiplegia (HCC)   Atrial fibrillation with RVR (HCC)   Acute on chronic respiratory failure with hypoxia (HCC)   Acute on chronic hypoxic respiratory failure  secondary to acute decompensated congestive heart failure, diastolic type. She was last seen by her cardiologist in 2002.  No known history of CHF or at least none known to [place].  Echo shows 55% ejection fraction with some diastolic dysfunction.  Chest x-ray shows multifocal airspace opacities with some pulmonary edema. Cardiology on board and we defer to them for further management.  Uses 4 to 6 L of oxygen at home however she is on nonrebreather right now and she is very obtunded.  Repeat ABG has hypoxia like yesterday but has improved PCO2.  She has been tested negative for COVID-19 x3 so far but still our suspicion remains very high for this particular patient.   She has been admitted as DNR.  According to nursing, there is some confusion about her CODE STATUS.  I tried to call her husband Mr. Nicki Reaper Yaney at the number provided in the chart [phone] and it looks like that number is not working anymore.  I ended up calling her daughter [name] at her cell phone [phone] who tells me that based on their discussions which was approximately 2 years ago, patient wanted to be DNR however daughter is currently not 100% sure.  Daughter is going to call patient sister who is closer to the patient and also nursing home to figure out if she wanted to be DNR.  Due to daughter's confusion and the fact that patient was admitted as DNR, IN daughter agreed to keep her as DNR until unless daughter calls her back and changes her CODE STATUS.  I did explain to the daughter that patient is in a grave situation right now and that based on her CODE STATUS, we will not resuscitate her, not intubate her if that for which she were to get worse from here.  Daughter verbalized understanding and she was in agreement.  I did notify PCCM and patient's nurse Lilia Pro about this as well.  Her obtundation could very well be due to polypharmacy.  She seems to be on high dose of gabapentin, baclofen, trazodone, Seroquel as well as PRN fentanyl.  I  will discontinue trazodone and gabapentin at this point in time.  Reduce frequency of fentanyl.  Sepsis secondary to community-acquired pneumonia vs viral pneumonia vs COVID-19 infection: She did come in with leukocytosis, tachycardia and tachypnea and met sepsis criteria and was diagnosed with community-acquired pneumonia however her procalcitonin is unremarkable which creates a question whether she even has bacterial pneumonia.  Urine antigen for Legionella and streptococci are negative.  Due to extensive infiltrates/bilateral pneumonia, I will expand antibiotics and switch her from Rocephin to cefepime and continue azithromycin.  New onset atrial fibrillation with RVR: On oral diltiazem and metoprolol.  Cardiology on board.  Defer management to them.    Type 2 diabetes mellitus with polyneuropathy: Takes high-dose long-acting insulin at home which have been on hold and despite of that patient's blood sugar is fairly controlled so I will continue SSI for now.   Essential hypertension: Controlled.  Imdur was discontinued yesterday..  CAD: Status post stents in the past.  Complaining of chest pain.  Wondering if this is her chronic pain.  Will check EKG and cardiac enzymes.  Continue statin and aspirin.  History of CVA: Known left spastic hemiplegia.  Hypothyroidism: Continue Synthroid  Hyperlipidemia: Continue atorvastatin  Chronic mild depression: Continue Zoloft  DVT prophylaxis: Lovenox Code Status: DNR.  Please refer to my discussion with her daughter today in assessment and plan. Family Communication: Discussed in length with daughter about her CODE STATUS and also updated her about her current situation and grave prognosis. Disposition Plan: To be determined   Time spent: I spent 50 minutes which included critical care of 15 minutes of assessing patient, coordination of care with the consultants, nursing staff, family members.   Darliss Cheney, MD [facility] Pager  [phone]  If 7PM-7AM, please contact night-coverage [email or web address] Password [facility] 04/08/2019, 10:56 AM

## 2019-04-08 NOTE — Progress Notes (Signed)
RT was called to patient's room due to decreased SAT's. Upon RT arrival patient's SAT's were in the low 70's on a 15L salter HFNC & a NRB. Patient was placed on a heated high flow nasal cannula at 40L & 100% FIO2. Patient's SAT's remained in the 70's on HHFNC at that time a NRB was also applied. CCM was called and made aware. SAT's are now at 84%. RN is also aware and was at the bedside.

## 2019-04-08 NOTE — Progress Notes (Signed)
Pt has been here for 3 days and came in a PUI for Covid. She has had 2 rapid covid neg test and one send out Covid neg test, but remains on precautions. NP called Dr. Johnnye Sima of ID to discuss case. After review of case, he stated pt could come off precautions. Pt in need of prn Bipap for low O2 sats, so she will be moved to "non covid" part of 2W and started on Bipap. KJKG, NP [facility]

## 2019-04-08 NOTE — Consult Note (Signed)
NAME:  Susan [name], MRN:  [id number], DOB:  March 23, 1951, LOS: 3 ADMISSION DATE:  04/08/2019, CONSULTATION DATE:  04/08/2019 REFERRING MD:  [facility], CHIEF COMPLAINT:  AMS   Brief History   Susan [name] a 68 y.o.femalewith medical history significant ofHTN, HLD,CAD s/p stents, COPD on 4 to 6 L, CVA with residual weakness, hypothyroidism, and depression; who presented with complaints of shortness of breath of 7 days duration. She was found to have A-fib with RVR in the setting of suspected viral vs bacterial PNA.  History of present illness   Patient was admitted 3 days prior doing well until yesterday evening when she developed increasing somnloence. Initially felt to be secondary to opiate medications and these medications along with other sedating medications were held. This AM she subsequently desaturated with worsening mental status. She required HFNC and NRB to maintain her O2 sats >92%.   Initially she was very somnolent and would not arouse. However, with deep suction she gagged and woke up. She was able to converse and maintain her sats at that point. She was alert to person but not place or time. She denied SHOB, CP, cough, abdominal pain. She overall feels tired. Unable to clarify DNR status given her AMS.    Past Medical History  HTN HLD CAD s/p stents COPD on 4 to 6 L CVA with residual weakness Hypothyroidism Depression  Significant Hospital Events   7/25 Admission  7/28 Significant desaturation   Consults:  Cardiology  PCCM  Procedures:  None  Significant Diagnostic Tests:  7/26 CTA > Extensive bilateral ground-glass and consolidative opacity  Micro Data:  7/24 Urine cultures > E. Coli (3K) 7/24 BCx2 > NGTD 7/26 Viral Respiratory > Negative  7/25 Covid > Negative  7/26 COVID sendout > negative  Antimicrobials:  Azithromycin 7/24 > 7/27  Ceftriaxone 7/24 > 7/27  Cefepime 7/28 >   Objective   Blood pressure (!) 116/55, pulse 93, temperature 99.1 F (37.3 C),  temperature source Axillary, resp. rate (!) 21, height 5\' 3"  (1.6 m), weight 82.1 kg, SpO2 94 %.    FiO2 (%):  [100 %] 100 %   Intake/Output Summary (Last 24 hours) at 04/08/2019 1133 Last data filed at 04/08/2019 0654 Gross per 24 hour  Intake 543 ml  Output 2250 ml  Net -1707 ml   Filed Weights   04/07/19 0400 04/07/19 1200 04/08/19 0436  Weight: 82.4 kg 82.3 kg 82.1 kg   Examination: General: Obese female, somnolent  HENT: Normocephalic, atraumatic, moist mucus membranes Pulm: Good air movement with diffuse rhonchi and wheezing  CV: Distance heart sounds, RRR  Abdomen: Active bowel sounds, soft, non-distended, no tenderness to palpation  Extremities: Pulses palpable in all extremities, no LE edema  Skin: Cool LE, dry  Neuro: Alert and oriented to person but not place or time  Resolved Hospital Problem list   None  Assessment & Plan:   Acute on Chronic Hypoxic / Hypercarbic Respiratory Failure  Bacterial vs Viral PNA COPD (4-6L/min [place] outpatient) - Continue antibiotic therapy day #5  - Patient is somnolent currently but is protecting her airway, oxygenating appropriately, and ventilating appropriately. She is a DNR/DNI.  - Target O2 sat of 88-92%. Theoretically a concerns for blowing her CO2 down too low and suppressing her respiratory drive. She has a baseline bicarb of ~34-38 indicating she likely lives with a pCO2 of 60-70.  - Limit sedating medications  - Continue SABA, add SAMA - Continuing LAMA - Diurese as tolerated  - PRN BiPAP  is needed. While mental status is a relative contraindication it can be used temporarily (1-2hrs) to see if MS improves.   A-Fib RVR - Setting of acute illness  - Echocardiogram with LVEF 50-55% - Now in NSR on diltiazem and metoprolol  - Cardiology onboard   Rest per primary.   Labs   CBC: Recent Labs  Lab 04/04/2019 0433 04/06/19 0719 04/07/19 0730 04/08/19 0627  WBC 13.7* 13.0* 15.3* 18.7*  NEUTROABS 12.7*  --  11.1* 15.0*   HGB 10.7* 10.3* 10.9* 12.5  HCT 38.5 36.4 38.4 43.7  MCV 93.0 92.4 92.5 89.0  PLT 282 288 295 426    Basic Metabolic Panel: Recent Labs  Lab 03/19/2019 0433 03/19/2019 1029 04/06/19 0719 04/07/19 0730 04/08/19 0627  NA 141  --  141 143 139  K 4.8  --  4.1 4.0 4.4  CL 95*  --  91* 90* 89*  CO2 33*  --  37* 39* 34*  GLUCOSE 148*  --  161* 142* 163*  BUN 19  --  18 18 15   CREATININE 0.57  --  0.69 0.66 0.71  CALCIUM 8.9  --  8.7* 9.0 9.0  MG  --  2.2 2.0 2.0  --    GFR: Estimated Creatinine Clearance: 68.3 mL/min (by C-G formula based on SCr of 0.71 mg/dL). Recent Labs  Lab 03/18/2019 0433 03/13/2019 1029 04/06/19 0719 04/07/19 0730 04/08/19 0627  PROCALCITON  --  0.14  --   --   --   WBC 13.7*  --  13.0* 15.3* 18.7*  LATICACIDVEN  --  1.4  --   --   --     Liver Function Tests: Recent Labs  Lab 03/13/2019 0433 04/07/19 0730 04/08/19 0627  AST 20 15 21   ALT 16 13 16   ALKPHOS 85 75 80  BILITOT 0.7 0.5 0.7  PROT 7.3 6.6 7.2  ALBUMIN 3.1* 2.9* 3.1*   No results for input(s): LIPASE, AMYLASE in the last 168 hours. No results for input(s): AMMONIA in the last 168 hours.  ABG    Component Value Date/Time   PHART 7.446 04/08/2019 0637   PCO2ART 57.2 (H) 04/08/2019 0637   PO2ART 55.4 (L) 04/08/2019 0637   HCO3 38.7 (H) 04/08/2019 0637   O2SAT 86.0 04/08/2019 0637     Coagulation Profile: Recent Labs  Lab 04/09/2019 1029  INR 1.1    Cardiac Enzymes: No results for input(s): CKTOTAL, CKMB, CKMBINDEX, TROPONINI in the last 168 hours.  HbA1C: Hemoglobin A1C  Date/Time Value Ref Range Status  06/14/2017 09:01 AM 7.4  Final  12/12/2016 01:52 PM 8.3  Final  09/13/2016 9.1  Final   Hgb A1c MFr Bld  Date/Time Value Ref Range Status  04/06/2019 07:19 AM 7.6 (H) 4.8 - 5.6 % Final    Comment:    (NOTE) Pre diabetes:          5.7%-6.4% Diabetes:              >6.4% Glycemic control for   <7.0% adults with diabetes     CBG: Recent Labs  Lab 04/07/19 1151  04/07/19 1634 04/07/19 2043 04/08/19 0749 04/08/19 1130  GLUCAP 140* 199* 174* 177* 148*    Review of Systems:   Unable to obtain due to MS.   Past Medical History  She,  has a past medical history of Anemia, CAD (coronary artery disease), Chronic pain, COPD (chronic obstructive pulmonary disease) ([place]), Depression, Diabetes mellitus without complication ([place]), Fall, Hemiplegia affecting non-dominant side,  post-stroke, Hyperlipidemia, Hypertension, Stroke ([place]) (12/2013), Thyroid disease, and Urine retention.   Surgical History    Past Surgical History:  Procedure Laterality Date  . ABDOMINAL HYSTERECTOMY    . APPENDECTOMY    . CATARACT EXTRACTION Right 12/07/2016  . CHOLECYSTECTOMY    . JOINT REPLACEMENT     bil hip  . TONSILLECTOMY       Social History   reports that she has quit smoking. Her smoking use included cigarettes. She has a 160.00 pack-year smoking history. She has never used smokeless tobacco. She reports that she does not drink alcohol or use drugs.   Family History   Her family history includes Diabetes in her mother.   Allergies Allergies  Allergen Reactions  . Onion Shortness Of Breath and Swelling  . Pollen Extract Itching  . Morphine And Related Hives    PT DENIES ALLERGY  . Niaspan [Niacin Er] Other (See Comments)    Unknown reaction  . Trazodone And Nefazodone Other (See Comments)    Hallucinations (currently being given at [facility] 03/26/2019)  . Valium [Diazepam] Other (See Comments)    Unknown reaction  . Wellbutrin [Bupropion] Other (See Comments)    Unknown reaction     Home Medications  Prior to Admission medications   Medication Sig Start Date End Date Taking? Authorizing Provider  acetaminophen (TYLENOL) 325 MG tablet Take 650 mg by mouth every 4 (four) hours as needed (pain).    Yes [provider]  acetaminophen (TYLENOL) 500 MG tablet Take 1,000 mg by mouth 3 (three) times daily. 10am, 5pm, 9pm   Yes [provider]   aspirin 81 MG chewable tablet Chew 81 mg by mouth daily.    Yes [provider]  atorvastatin (LIPITOR) 80 MG tablet Take 80 mg by mouth every evening.    Yes [provider]  baclofen (LIORESAL) 10 MG tablet Take 10 mg by mouth 3 (three) times daily. For muscle spasms 03/27/14  Yes Gerlene Fee, NP  cholecalciferol (VITAMIN D3) 25 MCG (1000 UT) tablet Take 1,000 Units by mouth every evening.   Yes [provider]  divalproex (DEPAKOTE) 250 MG DR tablet Take 250 mg by mouth every morning.   Yes [provider]  docusate sodium (COLACE) 100 MG capsule Take 2 capsules (200 mg total) by mouth 2 (two) times daily. Patient taking differently: Take 100 mg by mouth every morning.  07/01/14  Yes Gerlene Fee, NP  ferrous sulfate 325 (65 FE) MG tablet Take 325 mg by mouth 2 (two) times a day.   Yes [provider]  fluticasone (FLONASE) 50 MCG/ACT nasal spray Place 2 sprays into both nostrils every morning. For chronic sinus infections   Yes [provider]  gabapentin (NEURONTIN) 400 MG capsule Take 400-1,200 mg by mouth See admin instructions. Take one capsule (400 mg) by mouth twice daily - 10am and 5pm; take three capsules (1200 mg) at bedtime - 9pm   Yes [provider]  glimepiride (AMARYL) 1 MG tablet Take 1 mg by mouth daily with breakfast.    Yes [provider]  guaiFENesin (MUCINEX) 600 MG 12 hr tablet Take 600 mg by mouth every 12 (twelve) hours.   Yes [provider]  Insulin Glargine (TOUJEO SOLOSTAR) 300 UNIT/ML SOPN Inject 54-76 Units into the skin See admin instructions. Inject 76 units subcutaneously every morning (9am) and inject 54 units in the evening (6pm)   Yes [provider]  ipratropium-albuterol (DUONEB) 0.5-2.5 (3) MG/3ML  SOLN Take 3 mLs by nebulization See admin instructions. Inhale one vial via nebulizer every 8 hours, may also use every 6 hours as needed for shortness of  breath/wheezing   Yes [provider]  isosorbide mononitrate (IMDUR) 30 MG 24 hr tablet Take 30 mg by mouth every evening.    Yes [provider]  levofloxacin (LEVAQUIN) 750 MG tablet Take 750 mg by mouth daily.   Yes [provider]  levothyroxine (SYNTHROID) 50 MCG tablet Take 50 mcg by mouth daily at 6 (six) AM.    Yes [provider]  Lidocaine 4 % PTCH Apply 2 patches topically See admin instructions. Apply patch to most painful region every morning, remove 12 hours later. Apply another patch to right elbow every morning, remove after 12 hours   Yes [provider]  linagliptin (TRADJENTA) 5 MG TABS tablet Take 5 mg by mouth every morning.    Yes [provider]  magnesium oxide (MAG-OX) 400 MG tablet Take 400 mg by mouth 2 (two) times daily.   Yes [provider]  Melatonin 3 MG TABS Take 6 mg by mouth at bedtime.    Yes [provider]  metFORMIN (GLUCOPHAGE) 1000 MG tablet Take 1,000 mg by mouth 2 (two) times daily with a meal.    Yes [provider]  nitroGLYCERIN (NITROSTAT) 0.4 MG SL tablet Place 0.4 mg under the tongue every 5 (five) minutes as needed for chest pain. Notify MD if not relieved by NTG   Yes [provider]  omeprazole (PRILOSEC) 20 MG capsule Take 20 mg by mouth daily at 6 (six) AM.    Yes [provider]  Oxycodone HCl 10 MG TABS Take 10 mg by mouth 3 (three) times daily. 6am, 2pm, 9pm   Yes [provider]  OXYGEN Inhale 2 L into the lungs as needed (for O2 sats 88% or lower).    Yes [provider]  QUEtiapine (SEROQUEL) 25 MG tablet Take 25 mg by mouth at bedtime.   Yes [provider]  sertraline (ZOLOFT) 100 MG tablet Take 200 mg by mouth every morning.    Yes [provider]  simethicone (MYLICON) 80 MG chewable tablet Chew 80 mg by mouth 3 (three) times daily.   Yes [provider]  spironolactone (ALDACTONE) 25 MG tablet  Take 25 mg by mouth 2 (two) times daily. For edema   Yes [provider]  tiotropium (SPIRIVA) 18 MCG inhalation capsule Place 18 mcg into inhaler and inhale every morning.    Yes [provider]  traZODone (DESYREL) 50 MG tablet Take 125 mg by mouth at bedtime.   Yes [provider]  triamcinolone cream (KENALOG) 0.1 % Apply 1 application topically See admin instructions. Apply thin layer of cream to affected areas on lower face/jawline and lower neck/chest area - twice daily   Yes [provider]  vitamin B-12 (CYANOCOBALAMIN) 1000 MCG tablet Take 1,000 mcg by mouth daily with breakfast.   Yes [provider]  vitamin C (ASCORBIC ACID) 500 MG tablet Take 500 mg by mouth 2 (two) times daily.   Yes [provider]  methylPREDNISolone sodium succinate (SOLU-MEDROL) 125 mg/2 mL injection Inject 125 mg into the muscle once.    [provider]    Ina Homes, MD IMT[facility] PGY3  Pager: [phone]

## 2019-04-09 LAB — CBC WITH DIFFERENTIAL/PLATELET
Abs Immature Granulocytes: 0.29 10*3/uL — ABNORMAL HIGH (ref 0.00–0.07)
Basophils Absolute: 0.1 10*3/uL (ref 0.0–0.1)
Basophils Relative: 0 %
Eosinophils Absolute: 0 10*3/uL (ref 0.0–0.5)
Eosinophils Relative: 0 %
HCT: 37.6 % (ref 36.0–46.0)
Hemoglobin: 11.1 g/dL — ABNORMAL LOW (ref 12.0–15.0)
Immature Granulocytes: 2 %
Lymphocytes Relative: 4 %
Lymphs Abs: 0.8 10*3/uL (ref 0.7–4.0)
MCH: 25.9 pg — ABNORMAL LOW (ref 26.0–34.0)
MCHC: 29.5 g/dL — ABNORMAL LOW (ref 30.0–36.0)
MCV: 87.6 fL (ref 80.0–100.0)
Monocytes Absolute: 1 10*3/uL (ref 0.1–1.0)
Monocytes Relative: 5 %
Neutro Abs: 17.1 10*3/uL — ABNORMAL HIGH (ref 1.7–7.7)
Neutrophils Relative %: 89 %
Platelets: 268 10*3/uL (ref 150–400)
RBC: 4.29 MIL/uL (ref 3.87–5.11)
RDW: 17.6 % — ABNORMAL HIGH (ref 11.5–15.5)
WBC: 19.3 10*3/uL — ABNORMAL HIGH (ref 4.0–10.5)
nRBC: 0 % (ref 0.0–0.2)

## 2019-04-09 LAB — GLUCOSE, CAPILLARY
Glucose-Capillary: 235 mg/dL — ABNORMAL HIGH (ref 70–99)
Glucose-Capillary: 226 mg/dL — ABNORMAL HIGH (ref 70–99)
Glucose-Capillary: 189 mg/dL — ABNORMAL HIGH (ref 70–99)
Glucose-Capillary: 227 mg/dL — ABNORMAL HIGH (ref 70–99)

## 2019-04-09 LAB — COMPREHENSIVE METABOLIC PANEL
ALT: 15 U/L (ref 0–44)
AST: 22 U/L (ref 15–41)
Albumin: 2.7 g/dL — ABNORMAL LOW (ref 3.5–5.0)
Alkaline Phosphatase: 81 U/L (ref 38–126)
Anion gap: 20 — ABNORMAL HIGH (ref 5–15)
BUN: 16 mg/dL (ref 8–23)
CO2: 31 mmol/L (ref 22–32)
Calcium: 8.9 mg/dL (ref 8.9–10.3)
Chloride: 87 mmol/L — ABNORMAL LOW (ref 98–111)
Creatinine, Ser: 0.99 mg/dL (ref 0.44–1.00)
GFR calc Af Amer: >60 mL/min (ref >60)
GFR calc non Af Amer: 59 mL/min — ABNORMAL LOW (ref >60)
Glucose, Bld: 231 mg/dL — ABNORMAL HIGH (ref 70–99)
Potassium: 3.1 mmol/L — ABNORMAL LOW (ref 3.5–5.1)
Sodium: 138 mmol/L (ref 135–145)
Total Bilirubin: 2.1 mg/dL — ABNORMAL HIGH (ref 0.3–1.2)
Total Protein: 7.3 g/dL (ref 6.5–8.1)

## 2019-04-09 MED ORDER — PROMETHAZINE HCL 25 MG/ML IJ SOLN
6.2500 mg | Freq: Four times a day (QID) | INTRAMUSCULAR | Status: DC | PRN
  Administered 2019-04-10: 6.25 mg via INTRAVENOUS
  Filled 2019-04-09: qty 1

## 2019-04-09 MED ORDER — POTASSIUM CHLORIDE 10 MEQ/100ML IV SOLN
10.0000 meq | INTRAVENOUS | Status: AC
  Administered 2019-04-09 (×5): 10 meq via INTRAVENOUS
  Filled 2019-04-09 (×2): qty 100

## 2019-04-09 MED ORDER — METOPROLOL TARTRATE 50 MG PO TABS
50.0000 mg | ORAL_TABLET | Freq: Two times a day (BID) | ORAL | Status: DC
  Administered 2019-04-10: 50 mg via ORAL
  Filled 2019-04-09: qty 1

## 2019-04-09 MED ORDER — SODIUM CHLORIDE 0.9 % IV SOLN
2.0000 g | Freq: Two times a day (BID) | INTRAVENOUS | Status: DC
  Administered 2019-04-09 – 2019-04-10 (×2): 2 g via INTRAVENOUS
  Filled 2019-04-09 (×5): qty 2

## 2019-04-09 MED ORDER — DILTIAZEM HCL ER COATED BEADS 120 MG PO CP24
120.0000 mg | ORAL_CAPSULE | Freq: Every day | ORAL | Status: DC
  Administered 2019-04-10: 120 mg via ORAL
  Filled 2019-04-09: qty 1

## 2019-04-09 MED ORDER — FENTANYL CITRATE (PF) 100 MCG/2ML IJ SOLN
12.5000 ug | Freq: Four times a day (QID) | INTRAMUSCULAR | Status: DC | PRN
  Administered 2019-04-09 – 2019-04-10 (×4): 12.5 ug via INTRAVENOUS
  Filled 2019-04-09 (×4): qty 2

## 2019-04-09 NOTE — Progress Notes (Signed)
PROGRESS NOTE    Susan [name]  ZWC:[id number] DOB: 1951-06-21 DOA: 03/23/2019 PCP: Lattie Corns, PA-C   Brief Narrative:  Susan [name] is a 68 y.o. female with medical history significant of HTN, HLD, CAD s/p stents, COPD on 4 to 6 L, CVA with residual weakness, hypothyroidism, and depression; who presented with complaints of shortness of breath over the last week.No previous history of having an irregular heartbeat and initially stated that she wanted to go home.  Associated symptoms of productive cough, subjective fevers, palpitations, and generalized malaise.  Denied having any active chest pain, nausea, vomiting, diaphoresis, or leg pain.  Records show patient had recently been started on Levaquin earlier this week.  EMS found the patient with O2 saturations into the 80s which she was placed on a nonrebreather.  ED Course: On admission to the emergency department patient was noted to be afebrile, pulse up to 150s in atrial fibrillation with RVR, blood pressures 101/58-134/83, and O2 saturations as low as 84% with improvement to upper 80s to low 90s on 15 L flow oxygen.  Labs revealed WBC 13.7 and hemoglobin 10.7.  Chest x-ray showing cardiomegaly with shallow lung inflation and small right pleural effusion.  Patient was started on Cardizem drip.  Admitted to hospitalist service and cardiology consulted.  Patient is being treated for community-acquired pneumonia.  She had elevated d-dimer so CT angiogram of the chest was done and she was ruled out of PE.  This showed groundglass opacities bilaterally with diffuse bilateral infiltrates.  Procalcitonin is unremarkable however all other inflammatory markers including d-dimer, ferritin, LDH, ESR and CRP were elevated raising suspicion for COVID-19 so she was tested twice with a rapid test and once with send out test and all 3 of them are negative.  She remains PUI.  On the morning of 04/08/2019, she became more lethargic.  I was called at the bedside to  assess her urgently.  She was on nonrebreather 100%.  She was hard to arouse with a sternal rub however seem to be protecting her airway.  Stat ABG was done which showed 57 PCO2 and hypoxia.  Slightly better than yesterday.  PCCM was called urgently on 04/08/2019 for worsening respiratory status.  Consultants:   Cardiology  PCCM consulted 04/08/2019  Procedures:   None  Antimicrobials:   Rocephin and azithromycin started 03/31/2019.  Switched from Rocephin to cefepime on 04/08/2019   Subjective: Patient seen and examined.  She was on BiPAP.  She was completely alert and oriented.  Denied any shortness of breath.  Requesting ice chips.  Her BiPAP mask was too small for her face.  Discussed with RN to call RT to change her to appropriate fit mask.  Now that she is alert and oriented although she is still on BiPAP so I discussed with her about CODE STATUS and she confirmed that she would like to be DNR.  Objective: Vitals:   04/09/19 1148 04/09/19 1150 04/09/19 1200 04/09/19 1223  BP:      Pulse: 99 91 78   Resp: (!) 29 (!) 26 (!) 24   Temp:    (P) 99.5 F (37.5 C)  TempSrc:    (P) Axillary  SpO2: (!) 83% (!) 82% (!) 78%   Weight:      Height:        Intake/Output Summary (Last 24 hours) at 04/09/2019 1258 Last data filed at 04/09/2019 0315 Gross per 24 hour  Intake 670.17 ml  Output 1550 ml  Net -879.83 ml  Filed Weights   04/07/19 1200 04/08/19 0436 04/09/19 0330  Weight: 82.3 kg 82.1 kg 79 kg    Examination:  General exam: Appears calm and comfortable, morbidly obese on BiPAP Respiratory system: Coarse breath sounds anteriorly and diminished breath sounds at the bases. Respiratory effort normal. Cardiovascular system: S1 & S2 heard, RRR. No JVD, murmurs, rubs, gallops or clicks.  Trace pitting edema bilateral lower extremity Gastrointestinal system: Abdomen is nondistended, soft and nontender. No organomegaly or masses felt. Normal bowel sounds heard. Central nervous  system: Alert and oriented. No focal neurological deficits. Extremities: Symmetric 5 x 5 power. Skin: No rashes, lesions or ulcers Psychiatry: Judgement and insight appear normal. Mood & affect appropriate.   Data Reviewed: I have personally reviewed following labs and imaging studies  CBC: Recent Labs  Lab 03/26/2019 0433 04/06/19 0719 04/07/19 0730 04/08/19 0627 04/09/19 0444  WBC 13.7* 13.0* 15.3* 18.7* 19.3*  NEUTROABS 12.7*  --  11.1* 15.0* 17.1*  HGB 10.7* 10.3* 10.9* 12.5 11.1*  HCT 38.5 36.4 38.4 43.7 37.6  MCV 93.0 92.4 92.5 89.0 87.6  PLT 282 288 295 324 263   Basic Metabolic Panel: Recent Labs  Lab 03/17/2019 0433 04/03/2019 1029 04/06/19 0719 04/07/19 0730 04/08/19 0627 04/09/19 0444  NA 141  --  141 143 139 138  K 4.8  --  4.1 4.0 4.4 3.1*  CL 95*  --  91* 90* 89* 87*  CO2 33*  --  37* 39* 34* 31  GLUCOSE 148*  --  161* 142* 163* 231*  BUN 19  --  18 18 15 16   CREATININE 0.57  --  0.69 0.66 0.71 0.99  CALCIUM 8.9  --  8.7* 9.0 9.0 8.9  MG  --  2.2 2.0 2.0  --   --    GFR: Estimated Creatinine Clearance: 54.1 mL/min (by C-G formula based on SCr of 0.99 mg/dL). Liver Function Tests: Recent Labs  Lab 03/19/2019 0433 04/07/19 0730 04/08/19 0627 04/09/19 0444  AST 20 15 21 22   ALT 16 13 16 15   ALKPHOS 85 75 80 81  BILITOT 0.7 0.5 0.7 2.1*  PROT 7.3 6.6 7.2 7.3  ALBUMIN 3.1* 2.9* 3.1* 2.7*   No results for input(s): LIPASE, AMYLASE in the last 168 hours. No results for input(s): AMMONIA in the last 168 hours. Coagulation Profile: Recent Labs  Lab 03/23/2019 1029  INR 1.1   Cardiac Enzymes: No results for input(s): CKTOTAL, CKMB, CKMBINDEX, TROPONINI in the last 168 hours. BNP (last 3 results) No results for input(s): PROBNP in the last 8760 hours. HbA1C: No results for input(s): HGBA1C in the last 72 hours. CBG: Recent Labs  Lab 04/08/19 1130 04/08/19 1709 04/08/19 2202 04/09/19 0748 04/09/19 1221  GLUCAP 148* 190* 178* 235* 226*   Lipid  Profile: No results for input(s): CHOL, HDL, LDLCALC, TRIG, CHOLHDL, LDLDIRECT in the last 72 hours. Thyroid Function Tests: No results for input(s): TSH, T4TOTAL, FREET4, T3FREE, THYROIDAB in the last 72 hours. Anemia Panel: No results for input(s): VITAMINB12, FOLATE, FERRITIN, TIBC, IRON, RETICCTPCT in the last 72 hours. Sepsis Labs: Recent Labs  Lab 03/20/2019 1029  PROCALCITON 0.14  LATICACIDVEN 1.4    Recent Results (from the past 240 hour(s))  SARS Coronavirus 2 (CEPHEID- Performed in [place] hospital lab), Hosp Order     Status: None   Collection Time: 04/03/2019  3:54 AM   Specimen: Nasopharyngeal Swab  Result Value Ref Range Status   SARS Coronavirus 2 NEGATIVE NEGATIVE Final  Comment: (NOTE) If result is NEGATIVE SARS-CoV-2 target nucleic acids are NOT DETECTED. The SARS-CoV-2 RNA is generally detectable in upper and lower  respiratory specimens during the acute phase of infection. The lowest  concentration of SARS-CoV-2 viral copies this assay can detect is 250  copies / mL. A negative result does not preclude SARS-CoV-2 infection  and should not be used as the sole basis for treatment or other  patient management decisions.  A negative result may occur with  improper specimen collection / handling, submission of specimen other  than nasopharyngeal swab, presence of viral mutation(s) within the  areas targeted by this assay, and inadequate number of viral copies  (<250 copies / mL). A negative result must be combined with clinical  observations, patient history, and epidemiological information. If result is POSITIVE SARS-CoV-2 target nucleic acids are DETECTED. The SARS-CoV-2 RNA is generally detectable in upper and lower  respiratory specimens dur ing the acute phase of infection.  Positive  results are indicative of active infection with SARS-CoV-2.  Clinical  correlation with patient history and other diagnostic information is  necessary to determine patient  infection status.  Positive results do  not rule out bacterial infection or co-infection with other viruses. If result is PRESUMPTIVE POSTIVE SARS-CoV-2 nucleic acids MAY BE PRESENT.   A presumptive positive result was obtained on the submitted specimen  and confirmed on repeat testing.  While 2019 novel coronavirus  (SARS-CoV-2) nucleic acids may be present in the submitted sample  additional confirmatory testing may be necessary for epidemiological  and / or clinical management purposes  to differentiate between  SARS-CoV-2 and other Sarbecovirus currently known to infect humans.  If clinically indicated additional testing with an alternate test  methodology [phone]) is advised. The SARS-CoV-2 RNA is generally  detectable in upper and lower respiratory sp ecimens during the acute  phase of infection. The expected result is Negative. Fact Sheet for Patients:  StrictlyIdeas.no Fact Sheet for Healthcare Providers: BankingDealers.co.za This test is not yet approved or cleared by the [place] FDA and has been authorized for detection and/or diagnosis of SARS-CoV-2 by FDA under an Emergency Use Authorization (EUA).  This EUA will remain in effect (meaning this test can be used) for the duration of the COVID-19 declaration under Section 564(b)(1) of the Act, 21 U.S.C. section 360bbb-3(b)(1), unless the authorization is terminated or revoked sooner. Performed at [facility], [place] [address], [place], [place] [address]   Blood Culture (routine x 2)     Status: None (Preliminary result)   Collection Time: 04/06/2019  4:07 AM   Specimen: BLOOD  Result Value Ref Range Status   Specimen Description BLOOD RIGHT WRIST  Final   Special Requests   Final    BOTTLES DRAWN AEROBIC AND ANAEROBIC Blood Culture results may not be optimal due to an excessive volume of blood received in culture bottles   Culture   Final    NO GROWTH 4  DAYS Performed at [facility], [place] [address], [place], [place] [address]    Report Status PENDING  Incomplete  Blood Culture (routine x 2)     Status: None (Preliminary result)   Collection Time: 03/27/2019  5:16 AM   Specimen: BLOOD RIGHT HAND  Result Value Ref Range Status   Specimen Description BLOOD RIGHT HAND  Final   Special Requests   Final    BOTTLES DRAWN AEROBIC AND ANAEROBIC Blood Culture adequate volume   Culture   Final  NO GROWTH 4 DAYS Performed at [facility], [place] [address] [place], [place] [address]    Report Status PENDING  Incomplete  Urine culture     Status: Abnormal   Collection Time: 04/08/2019  6:30 AM   Specimen: In/Out Cath Urine  Result Value Ref Range Status   Specimen Description IN/OUT CATH URINE  Final   Special Requests   Final    NONE Performed at [facility], [place] [address], [place], [place] [address]    Culture 3,000 COLONIES/mL ESCHERICHIA COLI (A)  Final   Report Status 04/07/2019 FINAL  Final   Organism ID, Bacteria ESCHERICHIA COLI (A)  Final      Susceptibility   Escherichia coli - MIC*    AMPICILLIN <=2 SENSITIVE Sensitive     CEFAZOLIN <=4 SENSITIVE Sensitive     CEFTRIAXONE <=1 SENSITIVE Sensitive     CIPROFLOXACIN >=4 RESISTANT Resistant     GENTAMICIN <=1 SENSITIVE Sensitive     IMIPENEM <=0.25 SENSITIVE Sensitive     NITROFURANTOIN <=16 SENSITIVE Sensitive     TRIMETH/SULFA <=20 SENSITIVE Sensitive     AMPICILLIN/SULBACTAM <=2 SENSITIVE Sensitive     PIP/TAZO <=4 SENSITIVE Sensitive     Extended ESBL NEGATIVE Sensitive     * 3,000 COLONIES/mL ESCHERICHIA COLI  MRSA PCR Screening     Status: Abnormal   Collection Time: 03/22/2019 11:12 PM   Specimen: Nasal Mucosa; Nasopharyngeal  Result Value Ref Range Status   MRSA by PCR POSITIVE (A) NEGATIVE Final    Comment:        The GeneXpert MRSA Assay (FDA approved for NASAL specimens only), is one component of a comprehensive MRSA colonization surveillance  program. It is not intended to diagnose MRSA infection nor to guide or monitor treatment for MRSA infections. RESULT CALLED TO, READ BACK BY AND VERIFIED WITH: ZARSONA,R RN [id number] 04/06/2019 MITCHELL,L Performed at [place] [address], [place], [place] [address]   Novel Coronavirus, NAA (hospital order; send-out to ref lab)     Status: None   Collection Time: 04/06/19  8:00 AM  Result Value Ref Range Status   SARS-CoV-2, NAA NOT DETECTED NOT DETECTED Final    Comment: (NOTE) This test was developed and its performance characteristics determined by [employer]. This test has not been FDA cleared or approved. This test has been authorized by FDA under an Emergency Use Authorization (EUA). This test is only authorized for the duration of time the declaration that circumstances exist justifying the authorization of the emergency use of in vitro diagnostic tests for detection of SARS-CoV-2 virus and/or diagnosis of COVID-19 infection under section 564(b)(1) of the Act, 21 U.S.C. 503UUE-2(C)(0), unless the authorization is terminated or revoked sooner. When diagnostic testing is negative, the possibility of a false negative result should be considered in the context of a patient's recent exposures and the presence of clinical signs and symptoms consistent with COVID-19. An individual without symptoms of COVID-19 and who is not shedding SARS-CoV-2 virus would expect to have a negative (not detected) result in this assay. Performed  At: [facility] [address] [place], [place] [id number] Rush Farmer MD AV:[id number]    [place]  Final    Comment: Performed at [facility], [place] [address] [place], [place] [address]  Respiratory Panel by PCR     Status: None   Collection Time: 04/06/19  8:51 AM   Specimen: Nasopharyngeal Swab; Respiratory  Result Value Ref  Range Status   Adenovirus NOT DETECTED NOT DETECTED Final    Coronavirus 229E NOT DETECTED NOT DETECTED Final    Comment: (NOTE) The Coronavirus on the Respiratory Panel, DOES NOT test for the novel  Coronavirus (2019 nCoV)    Coronavirus HKU1 NOT DETECTED NOT DETECTED Final   Coronavirus NL63 NOT DETECTED NOT DETECTED Final   Coronavirus OC43 NOT DETECTED NOT DETECTED Final   Metapneumovirus NOT DETECTED NOT DETECTED Final   Rhinovirus / Enterovirus NOT DETECTED NOT DETECTED Final   Influenza A NOT DETECTED NOT DETECTED Final   Influenza B NOT DETECTED NOT DETECTED Final   Parainfluenza Virus 1 NOT DETECTED NOT DETECTED Final   Parainfluenza Virus 2 NOT DETECTED NOT DETECTED Final   Parainfluenza Virus 3 NOT DETECTED NOT DETECTED Final   Parainfluenza Virus 4 NOT DETECTED NOT DETECTED Final   Respiratory Syncytial Virus NOT DETECTED NOT DETECTED Final   Bordetella pertussis NOT DETECTED NOT DETECTED Final   Chlamydophila pneumoniae NOT DETECTED NOT DETECTED Final   Mycoplasma pneumoniae NOT DETECTED NOT DETECTED Final    Comment: Performed at [facility], [place] [address] [place], [place] [address]  SARS Coronavirus 2 (CEPHEID- Performed in [place] hospital lab), Hosp Order     Status: None   Collection Time: 04/06/19  6:30 PM   Specimen: Nasopharyngeal Swab  Result Value Ref Range Status   SARS Coronavirus 2 NEGATIVE NEGATIVE Final    Comment: (NOTE) If result is NEGATIVE SARS-CoV-2 target nucleic acids are NOT DETECTED. The SARS-CoV-2 RNA is generally detectable in upper and lower  respiratory specimens during the acute phase of infection. The lowest  concentration of SARS-CoV-2 viral copies this assay can detect is 250  copies / mL. A negative result does not preclude SARS-CoV-2 infection  and should not be used as the sole basis for treatment or other  patient management decisions.  A negative result may occur with  improper specimen collection / handling, submission of specimen other  than nasopharyngeal swab, presence of  viral mutation(s) within the  areas targeted by this assay, and inadequate number of viral copies  (<250 copies / mL). A negative result must be combined with clinical  observations, patient history, and epidemiological information. If result is POSITIVE SARS-CoV-2 target nucleic acids are DETECTED. The SARS-CoV-2 RNA is generally detectable in upper and lower  respiratory specimens dur ing the acute phase of infection.  Positive  results are indicative of active infection with SARS-CoV-2.  Clinical  correlation with patient history and other diagnostic information is  necessary to determine patient infection status.  Positive results do  not rule out bacterial infection or co-infection with other viruses. If result is PRESUMPTIVE POSTIVE SARS-CoV-2 nucleic acids MAY BE PRESENT.   A presumptive positive result was obtained on the submitted specimen  and confirmed on repeat testing.  While 2019 novel coronavirus  (SARS-CoV-2) nucleic acids may be present in the submitted sample  additional confirmatory testing may be necessary for epidemiological  and / or clinical management purposes  to differentiate between  SARS-CoV-2 and other Sarbecovirus currently known to infect humans.  If clinically indicated additional testing with an alternate test  methodology [phone]) is advised. The SARS-CoV-2 RNA is generally  detectable in upper and lower respiratory sp ecimens during the acute  phase of infection. The expected result is Negative. Fact Sheet for Patients:  StrictlyIdeas.no Fact Sheet for Healthcare Providers: BankingDealers.co.za This test is not yet approved or cleared by the [place] FDA  and has been authorized for detection and/or diagnosis of SARS-CoV-2 by FDA under an Emergency Use Authorization (EUA).  This EUA will remain in effect (meaning this test can be used) for the duration of the COVID-19 declaration under Section  564(b)(1) of the Act, 21 U.S.C. section 360bbb-3(b)(1), unless the authorization is terminated or revoked sooner. Performed at [facility], [place] [address] [place], [place] [address]       Radiology Studies: No results found.  Scheduled Meds:  atorvastatin  80 mg Oral q1800   baclofen  10 mg Oral TID   Chlorhexidine Gluconate Cloth  6 each Topical Q0600   diltiazem  30 mg Oral Q8H   divalproex  250 mg Oral q morning - 10a   enoxaparin (LOVENOX) injection  80 mg Subcutaneous Q12H   fluticasone  2 spray Each Nare q morning - 10a   guaiFENesin  600 mg Oral BID   insulin aspart  0-20 Units Subcutaneous TID WC   insulin aspart  0-5 Units Subcutaneous QHS   ipratropium-albuterol  3 mL Nebulization Q6H   levothyroxine  25 mcg Intravenous Daily   lidocaine  2 patch Transdermal Q24H   Melatonin  6 mg Oral QHS   metoprolol tartrate  5 mg Intravenous Q6H   mupirocin ointment  1 application Nasal BID   oxyCODONE  10 mg Oral TID   pantoprazole  40 mg Oral Daily   QUEtiapine  25 mg Oral QHS   sertraline  200 mg Oral Daily   sodium chloride flush  3 mL Intravenous Q12H   umeclidinium bromide  1 puff Inhalation Daily   Continuous Infusions:  ceFEPime (MAXIPIME) IV     potassium chloride 10 mEq (04/09/19 1154)     LOS: 4 days   Assessment & Plan:   Principal Problem:   Sepsis due to pneumonia ([place]) Active Problems:   CAD (coronary artery disease)   Hypothyroidism   Neuropathic pain syndrome (non-herpetic)   Type II diabetes mellitus with neurological manifestations, uncontrolled ([place])   Hyperlipidemia with target LDL less than 100   Left spastic hemiplegia (HCC)   Atrial fibrillation with RVR (HCC)   Acute on chronic respiratory failure with hypoxia (HCC)   Acute on chronic hypoxic respiratory failure secondary to acute decompensated congestive heart failure, diastolic type. She was last seen by her cardiologist in 2002.  No known history of CHF  or at least none known to [place].  Echo shows 55% ejection fraction with some diastolic dysfunction.  Chest x-ray shows multifocal airspace opacities with some pulmonary edema.  Uses 4 to 6 L of oxygen at home.  Currently on BiPAP.  PCCM on board.  I talked to her daughter yesterday on 04/08/2019 and confirmed her DNR CODE STATUS.  Now patient is alert and oriented and she also confirmed DNR CODE STATUS as well.    Lethargy: She was significantly lethargic yesterday and that was likely due to polypharmacy.  She was on high-dose of gabapentin, Seroquel at night, trazodone at night and fentanyl as needed as well as antiseizure medications.  Her trazodone and gabapentin were discontinued yesterday.  I am reducing her fentanyl frequency as well.  Continue current dose of baclofen 10 mg 3 times daily.  She is much more alert today.  Sepsis secondary to community-acquired pneumonia vs viral pneumonia vs COVID-19 infection: She did come in with leukocytosis, tachycardia and tachypnea and met sepsis criteria and was diagnosed with community-acquired pneumonia however her procalcitonin is unremarkable which creates  a question whether she even has bacterial pneumonia.  Urine antigen for Legionella and streptococci are negative.  Due to extensive infiltrates/bilateral pneumonia, I will continue cefepime as well as azithromycin.  New onset atrial fibrillation with RVR: Currently in sinus rhythm.  Cardiology managing.  She is on Lovenox for anticoagulation.  Type 2 diabetes mellitus with polyneuropathy: Takes high-dose long-acting insulin along with Tradjenta and glimepiride at home which have been on hold and her sugars were controlled upon yesterday and now they have started to creep up so I will start her on 10 units of Lantus and continue SSI.  Essential hypertension: Controlled.  Continue medications per cardiology.  CAD: Status post stents in the past.  No ACS symptoms. Continue statin and aspirin.  History of  CVA: Known left spastic hemiplegia.  Hypothyroidism: Continue Synthroid  Hyperlipidemia: Continue atorvastatin  Chronic mild depression: Continue Zoloft  DVT prophylaxis: Lovenox Code Status: DNR.  Please refer to my discussion with her daughter today in assessment and plan. Family Communication: Discussed in length with daughter on 04/08/2019 about her CODE STATUS and also updated her about her current situation and grave prognosis. Disposition Plan: To be determined   Total time spent 29 minutes  Darliss Cheney, MD [facility] Pager [phone]  If 7PM-7AM, please contact night-coverage [email or web address] Password [facility] 04/09/2019, 12:58 PM

## 2019-04-09 NOTE — Progress Notes (Signed)
Pt transferred from 2W32 to 2W17. Pt no longer PUI per ID. Bipap started. Throughout shift patient very agitated and pulling at bipap. Restraint remains in place as well as safety attendant. O2 saturations have remained in mid 80s occassionally reaching 90%. RT aware and patient is on 100% O2 on max settings.

## 2019-04-09 NOTE — Progress Notes (Signed)
PHARMACY NOTE:  ANTIMICROBIAL RENAL DOSAGE ADJUSTMENT  Current antimicrobial regimen includes a mismatch between antimicrobial dosage and estimated renal function.  As per policy approved by the Pharmacy & Therapeutics and Medical Executive Committees, the antimicrobial dosage will be adjusted accordingly.  Current antimicrobial dosage:  Cefepime 1g IV every 8 hours  Indication: Pneumonia  Renal Function:  Estimated Creatinine Clearance: 54.1 mL/min (by C-G formula based on SCr of 0.99 mg/dL). []      On intermittent HD, scheduled: []      On CRRT    Antimicrobial dosage has been changed to:  Cefepime 2g IV every 12 hours  Additional comments:   Thank you for allowing pharmacy to be a part of this patient's care.  Lawson Radar, [facility] 04/09/2019 11:16 AM

## 2019-04-09 NOTE — Progress Notes (Signed)
Pt resting comfortably. Pt has relaxed and is currently not trying to remove her bipap. Pt has repeatedly tried to pull it off. Pain medication given as well as nausea med. Mouth care completed as well.

## 2019-04-09 NOTE — Progress Notes (Signed)
NAME:  Susan [name], MRN:  [id number], DOB:  07-11-1951, LOS: 4 ADMISSION DATE:  03/14/2019, CONSULTATION DATE:  04/08/2019 REFERRING MD:  [facility], CHIEF COMPLAINT:  AMS   Brief History   Susan [name] a 68 y.o.femalewith medical history significant ofHTN, HLD,CAD s/p stents, COPD on 4 to 6 L, CVA with residual weakness, hypothyroidism, and depression; who presented with complaints of shortness of breath of 7 days duration. She was found to have A-fib with RVR in the setting of suspected viral vs bacterial PNA.  History of present illness   Patient was admitted 3 days prior doing well until yesterday evening when she developed increasing somnloence. Initially felt to be secondary to opiate medications and these medications along with other sedating medications were held. This AM she subsequently desaturated with worsening mental status. She required HFNC and NRB to maintain her O2 sats >92%.   Initially she was very somnolent and would not arouse. However, with deep suction she gagged and woke up. She was able to converse and maintain her sats at that point. She was alert to person but not place or time. She denied SHOB, CP, cough, abdominal pain. She overall feels tired. Unable to clarify DNR status given her AMS.    Past Medical History  HTN HLD CAD s/p stents COPD on 4 to 6 L CVA with residual weakness Hypothyroidism Depression  Significant Hospital Events   7/25 Admission  7/28 Significant desaturation   Consults:  Cardiology  PCCM  Procedures:  None  Significant Diagnostic Tests:  7/26 CTA > Extensive bilateral ground-glass and consolidative opacity  Micro Data:  7/24 Urine cultures > E. Coli (3K) 7/24 BCx2 > NGTD 7/26 Viral Respiratory > Negative  7/25 Covid > Negative  7/26 COVID sendout > negative  Antimicrobials:  Azithromycin 7/24 > 7/27  Ceftriaxone 7/24 > 7/27  Cefepime 7/28 >   Interval events: Patient has been on BiPAP since yesterday afternoon,  desaturated when she was taken off today.  16/8, tidal volumes high 400s, minute ventilation 12 L/min, SPO2 90%  Objective   Blood pressure 135/62, pulse 78, temperature (P) 99.5 F (37.5 C), temperature source (P) Axillary, resp. rate (!) 24, height 5\' 3"  (1.6 m), weight 79 kg, SpO2 (!) 83 %.    FiO2 (%):  [100 %] 100 %   Intake/Output Summary (Last 24 hours) at 04/09/2019 1459 Last data filed at 04/09/2019 0315 Gross per 24 hour  Intake 550.17 ml  Output 550 ml  Net 0.17 ml   Filed Weights   04/07/19 1200 04/08/19 0436 04/09/19 0330  Weight: 82.3 kg 82.1 kg 79 kg   Examination: General: Obese ill-appearing woman, on BiPAP HENT: BiPAP in place, oropharynx appears to be clear, no secretions Pulm: Very distant, no crackles or wheezes CV: Distant, regular, borderline tachycardic, no murmur Abdomen: Soft, nondistended, positive bowel sounds Extremities: No edema Skin: No rash Neuro: Wakes easily to voice, answers questions, follows commands.  Globally weak, moderate cough strength  Resolved Hospital Problem list   None  Assessment & Plan:   Acute on Chronic Hypoxic / Hypercarbic Respiratory Failure  Bacterial vs Viral PNA COPD (4-6L/min [place] outpatient) -Agree with antibiotics, day #6 -Continue support with BiPAP as needed pending improvement in pulmonary infiltrates.  Suspect a component of pulmonary edema superimposed on infection given her A. fib and diastolic dysfunction on presentation.  Diurese as able -Limit all sedating medications -Continue current bronchodilator regimen. -Prognosis here is poor.  She has underlying baseline hypoxemic respiratory failure, multifactorial.  She may fail despite BiPAP.  Agree with DNR/DNI status.  A-Fib RVR -In normal sinus rhythm, on diltiazem and metoprolol -Appreciate cardiology management   Labs   CBC: Recent Labs  Lab 04/09/2019 0433 04/06/19 0719 04/07/19 0730 04/08/19 0627 04/09/19 0444  WBC 13.7* 13.0* 15.3* 18.7* 19.3*   NEUTROABS 12.7*  --  11.1* 15.0* 17.1*  HGB 10.7* 10.3* 10.9* 12.5 11.1*  HCT 38.5 36.4 38.4 43.7 37.6  MCV 93.0 92.4 92.5 89.0 87.6  PLT 282 288 295 324 867    Basic Metabolic Panel: Recent Labs  Lab 03/28/2019 0433 04/04/2019 1029 04/06/19 0719 04/07/19 0730 04/08/19 0627 04/09/19 0444  NA 141  --  141 143 139 138  K 4.8  --  4.1 4.0 4.4 3.1*  CL 95*  --  91* 90* 89* 87*  CO2 33*  --  37* 39* 34* 31  GLUCOSE 148*  --  161* 142* 163* 231*  BUN 19  --  18 18 15 16   CREATININE 0.57  --  0.69 0.66 0.71 0.99  CALCIUM 8.9  --  8.7* 9.0 9.0 8.9  MG  --  2.2 2.0 2.0  --   --    GFR: Estimated Creatinine Clearance: 54.1 mL/min (by C-G formula based on SCr of 0.99 mg/dL). Recent Labs  Lab 04/08/2019 1029 04/06/19 0719 04/07/19 0730 04/08/19 0627 04/09/19 0444  PROCALCITON 0.14  --   --   --   --   WBC  --  13.0* 15.3* 18.7* 19.3*  LATICACIDVEN 1.4  --   --   --   --     Liver Function Tests: Recent Labs  Lab 04/06/2019 0433 04/07/19 0730 04/08/19 0627 04/09/19 0444  AST 20 15 21 22   ALT 16 13 16 15   ALKPHOS 85 75 80 81  BILITOT 0.7 0.5 0.7 2.1*  PROT 7.3 6.6 7.2 7.3  ALBUMIN 3.1* 2.9* 3.1* 2.7*   No results for input(s): LIPASE, AMYLASE in the last 168 hours. No results for input(s): AMMONIA in the last 168 hours.  ABG    Component Value Date/Time   PHART 7.446 04/08/2019 0637   PCO2ART 57.2 (H) 04/08/2019 0637   PO2ART 55.4 (L) 04/08/2019 0637   HCO3 38.7 (H) 04/08/2019 0637   O2SAT 86.0 04/08/2019 0637     Coagulation Profile: Recent Labs  Lab 04/04/2019 1029  INR 1.1    Cardiac Enzymes: No results for input(s): CKTOTAL, CKMB, CKMBINDEX, TROPONINI in the last 168 hours.  HbA1C: Hemoglobin A1C  Date/Time Value Ref Range Status  06/14/2017 09:01 AM 7.4  Final  12/12/2016 01:52 PM 8.3  Final  09/13/2016 9.1  Final   Hgb A1c MFr Bld  Date/Time Value Ref Range Status  04/06/2019 07:19 AM 7.6 (H) 4.8 - 5.6 % Final    Comment:    (NOTE) Pre diabetes:           5.7%-6.4% Diabetes:              >6.4% Glycemic control for   <7.0% adults with diabetes     CBG: Recent Labs  Lab 04/08/19 1130 04/08/19 1709 04/08/19 2202 04/09/19 0748 04/09/19 1221  GLUCAP 148* 190* 178* 235* 226*    Baltazar Apo, MD, PhD 04/09/2019, 3:04 PM [facility] [phone] or if no answer [phone]

## 2019-04-09 NOTE — Progress Notes (Signed)
Progress Note  Patient Name: Susan [name] Date of Encounter: 04/09/2019  Primary Cardiologist: Harrington Challenger  Subjective   Pt asking for water. On bipap this am.   Inpatient Medications    Scheduled Meds: . atorvastatin  80 mg Oral q1800  . baclofen  10 mg Oral TID  . Chlorhexidine Gluconate Cloth  6 each Topical Q0600  . diltiazem  30 mg Oral Q8H  . divalproex  250 mg Oral q morning - 10a  . enoxaparin (LOVENOX) injection  80 mg Subcutaneous Q12H  . fluticasone  2 spray Each Nare q morning - 10a  . guaiFENesin  600 mg Oral BID  . insulin aspart  0-20 Units Subcutaneous TID WC  . insulin aspart  0-5 Units Subcutaneous QHS  . ipratropium-albuterol  3 mL Nebulization Q6H  . levothyroxine  25 mcg Intravenous Daily  . lidocaine  2 patch Transdermal Q24H  . Melatonin  6 mg Oral QHS  . metoprolol tartrate  5 mg Intravenous Q6H  . mupirocin ointment  1 application Nasal BID  . oxyCODONE  10 mg Oral TID  . pantoprazole  40 mg Oral Daily  . QUEtiapine  25 mg Oral QHS  . sertraline  200 mg Oral Daily  . sodium chloride flush  3 mL Intravenous Q12H  . umeclidinium bromide  1 puff Inhalation Daily   Continuous Infusions: . ceFEPime (MAXIPIME) IV Stopped (04/09/19 0540)   PRN Meds: acetaminophen **OR** acetaminophen, fentaNYL (SUBLIMAZE) injection, levalbuterol, naLOXone (NARCAN)  injection, ondansetron **OR** ondansetron (ZOFRAN) IV, promethazine   Vital Signs    Vitals:   04/09/19 0330 04/09/19 0335 04/09/19 0338 04/09/19 0352  BP: (!) 152/61 129/72    Pulse: (!) 101  98   Resp: 19     Temp:      TempSrc: Axillary     SpO2: (!) 83%  (!) 86% (!) 86%  Weight: 79 kg     Height:        Intake/Output Summary (Last 24 hours) at 04/09/2019 0758 Last data filed at 04/09/2019 0315 Gross per 24 hour  Intake 730.17 ml  Output 1550 ml  Net -819.83 ml   Filed Weights   04/07/19 1200 04/08/19 0436 04/09/19 0330  Weight: 82.3 kg 82.1 kg 79 kg    Physical Exam   General: Well  developed, well nourished, wearing bipap HEENT: bipap mask in place SKIN: warm, dry. No rashes. Neuro: No focal deficits  Musculoskeletal: Muscle strength 5/5 all ext  Psychiatric: Mood and affect normal  Neck: No JVD, no carotid bruits, no thyromegaly, no lymphadenopathy.  Lungs: Cardiovascular: Regular rate and rhythm. No murmurs, gallops or rubs. Abdomen:Soft. Bowel sounds present. Non-tender.  Extremities: No lower extremity edema. Pulses are 2 + in the bilateral DP/PT.   Labs    Chemistry Recent Labs  Lab 04/07/19 0730 04/08/19 0627 04/09/19 0444  NA 143 139 138  K 4.0 4.4 3.1*  CL 90* 89* 87*  CO2 39* 34* 31  GLUCOSE 142* 163* 231*  BUN 18 15 16   CREATININE 0.66 0.71 0.99  CALCIUM 9.0 9.0 8.9  PROT 6.6 7.2 7.3  ALBUMIN 2.9* 3.1* 2.7*  AST 15 21 22   ALT 13 16 15   ALKPHOS 75 80 81  BILITOT 0.5 0.7 2.1*  GFRNONAA >60 >60 59*  GFRAA >60 >60 >60  ANIONGAP 14 16* 20*     Hematology Recent Labs  Lab 04/07/19 0730 04/08/19 0627 04/09/19 0444  WBC 15.3* 18.7* 19.3*  RBC 4.15 4.91  4.29  HGB 10.9* 12.5 11.1*  HCT 38.4 43.7 37.6  MCV 92.5 89.0 87.6  MCH 26.3 25.5* 25.9*  MCHC 28.4* 28.6* 29.5*  RDW 17.9* 17.7* 17.6*  PLT 295 324 268    Cardiac EnzymesNo results for input(s): TROPONINI in the last 168 hours. No results for input(s): TROPIPOC in the last 168 hours.   BNP Recent Labs  Lab 04/04/2019 0433  BNP 501.2*     DDimer  Recent Labs  Lab 04/06/19 1224  DDIMER 7.71*     Radiology    Dg Chest Port 1 View  Result Date: 04/07/2019 CLINICAL DATA:  Chest pain EXAM: PORTABLE CHEST 1 VIEW COMPARISON:  03/26/2019 FINDINGS: Cardiomegaly. Atherosclerotic calcification of the thoracic aorta and coronary arteries. There are persistent bilateral airspace opacities most pronounced within the right upper lobe and bilateral lung bases. Probable small bilateral pleural effusions. No pneumothorax. IMPRESSION: Persistent multifocal airspace opacity with probable  small bilateral pleural effusions. Findings not significantly changed compared to the prior study. Electronically Signed   By: Davina Poke M.D.   On: 04/07/2019 12:53   Telemetry    04/07/2019 Atrial fibrillation with rates in the 90-100 range  - Personally Reviewed  ECG    03/14/2019 AF with RVR rate 155bpm with no acute ischemic changes  - Personally Reviewed  Cardiac Studies   Echocardiogram 04/06/2019:  1. The left ventricle has low normal systolic function, with an ejection fraction of 50-55%. The cavity size was normal. There is mildly increased left ventricular wall thickness. Left ventricular diastolic Doppler parameters are indeterminate in the  setting of atrial fibrillation.  2. The right ventricle has normal systolic function. The cavity was normal. There is no increase in right ventricular wall thickness. Right ventricular systolic pressure is moderately elevated with an estimated pressure of 48.9 mmHg.  3. The aortic valve is tricuspid. Mild calcification of the aortic valve. Moderate aortic annular calcification noted.  4. The mitral valve is grossly normal. Mild calcification of the mitral valve leaflet. There is mild mitral annular calcification present.  5. The tricuspid valve is grossly normal.  6. The aorta is normal in size and structure.  7. Probable prominent epicardial fat pad.  Patient Profile     68 y.o. female with a hx of CAD, DM2, neuropathy, CVA with residual weakness and HTN who is being followed by Cardiology for the evaluation of atrial fibrillation   Assessment & Plan    1.  New onset atrial fibrillation with RVR: She is in sinus. Would change Diltiazem to once daily dosing (Cardizem CD 120 mg daily) and change metoprolol to 50 mg po BID. Lovenox per pharmacy for anti-coagulation. If the plan is for aggressive management, she will need long term anti-coagulation. Her CHA2DS2VASc =8  2.  Acute on chronic hypoxic respiratory failure: Echocardiogram  from 04/06/2019 with LVEF of 50 to 55% with intermediate left ventricular diastolic parameters and no valvular disease.  Moderately elevated RV pressures. Baseline COPD on 4 to 6 L home supplemental O2. COVID negative. I do not think she is volume overloaded today but her renal function has remained normal with diuresis and her respiratory status has improved slightly. PCCM following. No new recs.    3. History of CAD s/p PCI x5, followed remotely at [facility] (no records available):  Negative troponin. No chest pain. Continue statin, ASA  Cardiology will sign off. Please call with questions.   Lauree Chandler 04/09/2019 7:58 AM

## 2019-04-09 NOTE — Care Management Important Message (Signed)
Important Message  Patient Details  Name: Susan [name] MRN: [id number] Date of Birth: 1950/10/19   Medicare Important Message Given:  Yes     Orbie Pyo 04/09/2019, 2:56 PM

## 2019-04-09 NOTE — Progress Notes (Signed)
RN called d/t MD requesting larger bipap mask d/t pt mask leaking.  Pt tol mask change well.  Sat 83% on 100% fio2- RN aware, and MD aware-per RN.  Will notify floor RT re: this.

## 2019-04-10 ENCOUNTER — Inpatient Hospital Stay (HOSPITAL_COMMUNITY): Payer: Medicare Other

## 2019-04-10 DIAGNOSIS — Z515 Encounter for palliative care: Secondary | ICD-10-CM

## 2019-04-10 DIAGNOSIS — Z7189 Other specified counseling: Secondary | ICD-10-CM

## 2019-04-10 LAB — CULTURE, BLOOD (ROUTINE X 2)
Culture: NO GROWTH
Culture: NO GROWTH
Special Requests: ADEQUATE

## 2019-04-10 LAB — CBC WITH DIFFERENTIAL/PLATELET
Abs Immature Granulocytes: 0.37 10*3/uL — ABNORMAL HIGH (ref 0.00–0.07)
Basophils Absolute: 0.1 10*3/uL (ref 0.0–0.1)
Basophils Relative: 0 %
Eosinophils Absolute: 0 10*3/uL (ref 0.0–0.5)
Eosinophils Relative: 0 %
HCT: 35.8 % — ABNORMAL LOW (ref 36.0–46.0)
Hemoglobin: 10.3 g/dL — ABNORMAL LOW (ref 12.0–15.0)
Immature Granulocytes: 2 %
Lymphocytes Relative: 3 %
Lymphs Abs: 0.7 10*3/uL (ref 0.7–4.0)
MCH: 25.6 pg — ABNORMAL LOW (ref 26.0–34.0)
MCHC: 28.8 g/dL — ABNORMAL LOW (ref 30.0–36.0)
MCV: 88.8 fL (ref 80.0–100.0)
Monocytes Absolute: 1.2 10*3/uL — ABNORMAL HIGH (ref 0.1–1.0)
Monocytes Relative: 6 %
Neutro Abs: 18.9 10*3/uL — ABNORMAL HIGH (ref 1.7–7.7)
Neutrophils Relative %: 89 %
Platelets: 284 10*3/uL (ref 150–400)
RBC: 4.03 MIL/uL (ref 3.87–5.11)
RDW: 17.9 % — ABNORMAL HIGH (ref 11.5–15.5)
WBC: 21.3 10*3/uL — ABNORMAL HIGH (ref 4.0–10.5)
nRBC: 0.1 % (ref 0.0–0.2)

## 2019-04-10 LAB — COMPREHENSIVE METABOLIC PANEL
ALT: 18 U/L (ref 0–44)
AST: 27 U/L (ref 15–41)
Albumin: 2.7 g/dL — ABNORMAL LOW (ref 3.5–5.0)
Alkaline Phosphatase: 90 U/L (ref 38–126)
Anion gap: 16 — ABNORMAL HIGH (ref 5–15)
BUN: 15 mg/dL (ref 8–23)
CO2: 30 mmol/L (ref 22–32)
Calcium: 8.9 mg/dL (ref 8.9–10.3)
Chloride: 94 mmol/L — ABNORMAL LOW (ref 98–111)
Creatinine, Ser: 0.79 mg/dL (ref 0.44–1.00)
GFR calc Af Amer: >60 mL/min (ref >60)
GFR calc non Af Amer: >60 mL/min (ref >60)
Glucose, Bld: 269 mg/dL — ABNORMAL HIGH (ref 70–99)
Potassium: 3.2 mmol/L — ABNORMAL LOW (ref 3.5–5.1)
Sodium: 140 mmol/L (ref 135–145)
Total Bilirubin: 1.3 mg/dL — ABNORMAL HIGH (ref 0.3–1.2)
Total Protein: 7 g/dL (ref 6.5–8.1)

## 2019-04-10 LAB — GLUCOSE, CAPILLARY: Glucose-Capillary: 210 mg/dL — ABNORMAL HIGH (ref 70–99)

## 2019-04-10 MED ORDER — INSULIN GLARGINE 100 UNIT/ML ~~LOC~~ SOLN
15.0000 [IU] | Freq: Every day | SUBCUTANEOUS | Status: DC
  Administered 2019-04-10: 15 [IU] via SUBCUTANEOUS
  Filled 2019-04-10 (×2): qty 0.15

## 2019-04-10 MED ORDER — MORPHINE 100MG IN NS 100ML (1MG/ML) PREMIX INFUSION
2.0000 mg/h | INTRAVENOUS | Status: DC
  Administered 2019-04-10: 2 mg/h via INTRAVENOUS
  Filled 2019-04-10: qty 100

## 2019-04-10 MED ORDER — FUROSEMIDE 10 MG/ML IJ SOLN
40.0000 mg | Freq: Two times a day (BID) | INTRAMUSCULAR | Status: DC
  Administered 2019-04-10: 40 mg via INTRAVENOUS
  Filled 2019-04-10: qty 4

## 2019-04-10 MED ORDER — LORAZEPAM 2 MG/ML IJ SOLN: 2.0000 mg | INTRAMUSCULAR | Status: DC | PRN

## 2019-04-10 MED ORDER — POTASSIUM CHLORIDE 10 MEQ/100ML IV SOLN
10.0000 meq | INTRAVENOUS | Status: AC
  Administered 2019-04-10 (×4): 10 meq via INTRAVENOUS
  Filled 2019-04-10 (×4): qty 100

## 2019-04-10 MED ORDER — MORPHINE SULFATE (PF) 2 MG/ML IV SOLN
1.0000 mg | INTRAVENOUS | Status: DC | PRN
  Administered 2019-04-10: 1 mg via INTRAVENOUS
  Filled 2019-04-10: qty 1

## 2019-04-10 MED ORDER — DILTIAZEM HCL 25 MG/5ML IV SOLN
5.0000 mg | Freq: Once | INTRAVENOUS | Status: AC
  Administered 2019-04-10: 5 mg via INTRAVENOUS
  Filled 2019-04-10: qty 5

## 2019-04-10 MED ORDER — MORPHINE SULFATE (PF) 2 MG/ML IV SOLN
1.0000 mg | INTRAVENOUS | Status: DC | PRN
  Administered 2019-04-10 (×2): 1 mg via INTRAVENOUS
  Filled 2019-04-10 (×2): qty 1

## 2019-04-10 MED ORDER — DIPHENHYDRAMINE HCL 50 MG/ML IJ SOLN: 25.0000 mg | INTRAMUSCULAR | Status: DC | PRN

## 2019-04-10 MED ORDER — MORPHINE BOLUS VIA INFUSION
2.0000 mg | INTRAVENOUS | Status: DC | PRN
  Administered 2019-04-10: 2 mg via INTRAVENOUS
  Filled 2019-04-10: qty 2

## 2019-04-10 MED ORDER — LORAZEPAM 2 MG/ML IJ SOLN
1.0000 mg | INTRAMUSCULAR | Status: DC | PRN
  Administered 2019-04-10: 1 mg via INTRAVENOUS
  Filled 2019-04-10: qty 1

## 2019-04-11 NOTE — Progress Notes (Signed)
I contacted Pt Placement for the hospital at [phone] option 1 and let them know the pt's possible ex husband will be notifying them in the morning and giving them information about the divorce finalization and who is taking responsibility for the funeral or donation arrangements.

## 2019-04-12 NOTE — Progress Notes (Signed)
Called pt's daughter [name] re: per Pt Placement regarding pt's marital status and next of kin. Beth stated the family's and pt's understanding when pt was served divorce papers on June 6,2020 if pt didn't sign them or contest the divorce within 30 days the divorce would be final. Beth doesn't know her (ex ?) step father's telephone number but her Georgianne Fick does and spoke with the man tonight and let him know the pt had passed away. Beth gave me Kathy's number and I'll contact her to get the pt's (ex ?)husband's phone number.

## 2019-04-12 NOTE — Progress Notes (Signed)
Spoke with Jae Dire [place].Referral Number [id number]

## 2019-04-12 NOTE — Progress Notes (Signed)
Palliative Medicine RN Note: Consult order noted. PMT will see Susan [name] when a provider is available. Charge nurse Sharyn Lull called with concerns that Susan [name] is suffering; she had previously said she did not want to be on bipap and is struggling despite bipap. She is currently a DNR. There is concern about her capacity, as she is in restraints and has a Actuary.  Spoke with PMT NP [name]. She provided emergency comfort orders until our team can see her. Morphine was ordered, and possible allergy noted (patient denied this allergy); she also gave an order for Benadryl prn.  Marjie Skiff Kayal Mula, RN, BSN, [facility] Palliative Medicine Team 2019/04/27 1:11 PM Office [phone]

## 2019-04-12 NOTE — Progress Notes (Signed)
Spoke with Genella Mech at [facility]. He stated it is ok for the body to come to them without paperwork being done before hand (it's just easier if it is already done). I need to contact daughter and let her know she is responsible for the cost of the transportation of the body, before body can be picked up and brought to [facility]) a release for the body to them has to be filled out, easiest way for that was if the daughter was still at the hospital to down load the form then fax it back but since daughter has already left she can down load it herself and fax it to them or call Heather at [phone] in the morning and she will email the form to the daughter and then she can fill it out and send it back to [facility]. I informed Gerald Stabs I would call the daughter and let her know about the above conversation.

## 2019-04-12 NOTE — Progress Notes (Signed)
Spoke with Jae Dire at [facility] and pt isn't an acceptable donor r/t dx of Sepsis. Referral Number [id number]. Estill Batten gave me number to [facility] so I could contact them about entire body donation for [facility]. This was pt's wishes

## 2019-04-12 NOTE — Progress Notes (Signed)
Palliative Care NP called to check on pt's condition and if pt was still on BiPaP. Informed NP sister has decided to keep pt on BiPaP until pt's daughter arrives and visits with pt and then make the determination. Pt's sister stated earlier that pt made wishes earlier before this illness she wanted to donate body to [facility] because of some rare illnesses and family genetics but she'll wait and see what the daughter wants and of course if Organ Donation will even take the body or organs.

## 2019-04-12 NOTE — Progress Notes (Signed)
Notified Susan [name] as well as RT of patient oxygen saturations maintaining in high 70s-low 80s on max bipap settings

## 2019-04-12 NOTE — Progress Notes (Signed)
[facility] notified. I spoke with the operator and gave him general info and he is going to contact the person on call for that department and have them call me.

## 2019-04-12 NOTE — Progress Notes (Signed)
Spoke with pt's daughter Burt Knack at [phone] and gave her the info from [place]. Daughter to contact Nira Conn in the am, a funeral home, and then contact Pt Services here at the hospital who the funeral home is to pick up the pt. Daughter will get paper work done for [employer] before notifying the [facility] and contacting this hospital

## 2019-04-12 NOTE — Progress Notes (Signed)
Palliative Medicine RN Note: Came to room to assess pt's potential for decision making. O2 sat 65% on bipap. She is very pale and sleepy. I can wake her, but she goes back to sleep very quickly. I asked her some yes/no questions, as her ability to speak is limited d/t bipap. She could agree that she is in a hospital and very sick with her lungs. She knew it was not 2015 and that it was 2020. When I asked who we should call about her care, her daughter or her husband, she replied, "my husband." I attempted to call her husband, but one number is disconnected, and another doesn't answer. None of the daughter's numbers answer, but I did leave a VM and a text message on her cell.   PMT NP [name] arrived shortly after me to provide more advanced support.  Susan Skiff Cozetta Seif, RN, BSN, [facility] Palliative Medicine Team April 26, 2019 1:56 PM Office [phone]

## 2019-04-12 NOTE — Progress Notes (Signed)
No Spontaneous Respirations and No AHR verified by myself and Estill Bamberg RN CN. Morphine Drip and Carrier IVF turned off. Family at bedside.

## 2019-04-12 NOTE — Progress Notes (Signed)
Brief PCCM Follow up note:  Noted that pt has again been confirmed DNR/DNI and is transitioning to comfort measures.   PCCM will sign off. Thank you for the opportunity to participate in this patient's care. Please contact if we can be of further assistance.  Francine Graven, MSN, AGACNP  Pager [phone] or if no answer [phone] [facility]

## 2019-04-12 NOTE — Progress Notes (Signed)
PROGRESS NOTE    Susan [name]  MHD:[id number] DOB: [birth year] DOA: 04/01/2019 PCP: Lattie Corns, PA-C   Brief Narrative:  Susan [name] is a 68 y.o. female with medical history significant of HTN, HLD, CAD s/p stents, COPD on 4 to 6 L, CVA with residual weakness, hypothyroidism, and depression; who presented with complaints of shortness of breath over the last week.No previous history of having an irregular heartbeat and initially stated that she wanted to go home.  Associated symptoms of productive cough, subjective fevers, palpitations, and generalized malaise.  Denied having any active chest pain, nausea, vomiting, diaphoresis, or leg pain.  Records show patient had recently been started on Levaquin earlier this week.  EMS found the patient with O2 saturations into the 80s which she was placed on a nonrebreather.  Upon arrival to the emergency department patient was noted to be afebrile, pulse up to 150s in atrial fibrillation with RVR, blood pressures 101/58-134/83, and O2 saturations as low as 84% with improvement to upper 80s to low 90s on 15 L flow oxygen.  Labs revealed WBC 13.7 and hemoglobin 10.7.  Chest x-ray showing cardiomegaly with shallow lung inflation and small right pleural effusion.  Patient was started on Cardizem drip.  Admitted to hospitalist service and cardiology consulted.  Patient is being treated for community-acquired pneumonia.  She had elevated d-dimer so CT angiogram of the chest was done and she was ruled out of PE.  This showed groundglass opacities bilaterally with diffuse bilateral infiltrates.  Procalcitonin is unremarkable however all other inflammatory markers including d-dimer, ferritin, LDH, ESR and CRP were elevated raising suspicion for COVID-19 so she was tested twice with a rapid test and once with send out test and all 3 of them are negative.  On the morning of 04/08/2019, she became more lethargic.  I was called at the bedside to assess her urgently.  She was  on nonrebreather 100%.  She was hard to arouse with a sternal rub however seem to be protecting her airway.  Stat ABG was done which showed 57 PCO2 and hypoxia.  Slightly better than day before.  PCCM was called urgently on 04/08/2019 for worsening respiratory status.  She was started on BiPAP.  She was found to be on several sedative medications including high-dose of twice daily gabapentin, Seroquel, trazodone, as needed fentanyl, baclofen 10 mg 3 times daily.  Her gabapentin and trazodone were discontinued.  Fentanyl was spaced out.  Could not discontinue baclofen all of a sudden due to withdrawal.  She improved with those measures.  She is remained on BiPAP for most part since 04/08/2019.  While she was in respiratory distress on 04/08/2019, I also called patient's daughter [name] and confirmed her DNR status.  When patient was more awake on 04/09/2019, I also confirmed DNR status with the patient herself.  Consultants:   Cardiology  PCCM consulted 04/08/2019  Procedures:   None  Antimicrobials:   Rocephin and azithromycin started 03/12/2019.  Switched from Rocephin to cefepime on 04/08/2019   Subjective: Patient seen and examined.  Sitter at the bedside.  Per nursing, she is trying to pull her BiPAP mask all the time so she was in soft restraints.  Patient was alert and oriented x3.  Denied any shortness of breath.  Still on BiPAP.  Objective: Vitals:   2019-04-18 0626 04/18/19 0708 18-Apr-2019 0836 04/18/19 0837  BP:  (!) 151/58    Pulse:  (!) 109  (!) 101  Resp:  (!) 29  Marland Kitchen)  32  Temp:      TempSrc:      SpO2: (!) 82% (!) 85% (!) 86% (!) 88%  Weight:      Height:        Intake/Output Summary (Last 24 hours) at 04-16-19 1017 Last data filed at 2019-04-16 0317 Gross per 24 hour  Intake 590.01 ml  Output --  Net 590.01 ml   Filed Weights   04/08/19 0436 04/09/19 0330 April 16, 2019 0351  Weight: 82.1 kg 79 kg 78.8 kg    Examination:  General exam: Appears calm and comfortable, on  BiPAP, morbidly obese Respiratory system: Diminished breath sounds with crackles at the bases. Respiratory effort normal. Cardiovascular system: S1 & S2 heard, irregularly regular rate and rhythm, no JVD, murmurs, rubs, gallops or clicks.  Trace pitting edema bilateral lower extremity Gastrointestinal system: Abdomen is nondistended, soft and nontender. No organomegaly or masses felt. Normal bowel sounds heard. Central nervous system: Alert and oriented. No focal neurological deficits. Extremities: Symmetric 5 x 5 power. Skin: No rashes, lesions or ulcers Psychiatry: Judgement and insight appear poor.    Data Reviewed: I have personally reviewed following labs and imaging studies  CBC: Recent Labs  Lab 04/08/2019 0433 04/06/19 0719 04/07/19 0730 04/08/19 0627 04/09/19 0444 April 16, 2019 0635  WBC 13.7* 13.0* 15.3* 18.7* 19.3* 21.3*  NEUTROABS 12.7*  --  11.1* 15.0* 17.1* 18.9*  HGB 10.7* 10.3* 10.9* 12.5 11.1* 10.3*  HCT 38.5 36.4 38.4 43.7 37.6 35.8*  MCV 93.0 92.4 92.5 89.0 87.6 88.8  PLT 282 288 295 324 268 211   Basic Metabolic Panel: Recent Labs  Lab 03/30/2019 1029 04/06/19 0719 04/07/19 0730 04/08/19 0627 04/09/19 0444 04-16-19 0635  NA  --  141 143 139 138 140  K  --  4.1 4.0 4.4 3.1* 3.2*  CL  --  91* 90* 89* 87* 94*  CO2  --  37* 39* 34* 31 30  GLUCOSE  --  161* 142* 163* 231* 269*  BUN  --  18 18 15 16 15   CREATININE  --  0.69 0.66 0.71 0.99 0.79  CALCIUM  --  8.7* 9.0 9.0 8.9 8.9  MG 2.2 2.0 2.0  --   --   --    GFR: Estimated Creatinine Clearance: 66.9 mL/min (by C-G formula based on SCr of 0.79 mg/dL). Liver Function Tests: Recent Labs  Lab 03/24/2019 0433 04/07/19 0730 04/08/19 0627 04/09/19 0444 2019-04-16 0635  AST 20 15 21 22 27   ALT 16 13 16 15 18   ALKPHOS 85 75 80 81 90  BILITOT 0.7 0.5 0.7 2.1* 1.3*  PROT 7.3 6.6 7.2 7.3 7.0  ALBUMIN 3.1* 2.9* 3.1* 2.7* 2.7*   No results for input(s): LIPASE, AMYLASE in the last 168 hours. No results for  input(s): AMMONIA in the last 168 hours. Coagulation Profile: Recent Labs  Lab 04/04/2019 1029  INR 1.1   Cardiac Enzymes: No results for input(s): CKTOTAL, CKMB, CKMBINDEX, TROPONINI in the last 168 hours. BNP (last 3 results) No results for input(s): PROBNP in the last 8760 hours. HbA1C: No results for input(s): HGBA1C in the last 72 hours. CBG: Recent Labs  Lab 04/08/19 2202 04/09/19 0748 04/09/19 1221 04/09/19 1551 04/09/19 2048  GLUCAP 178* 235* 226* 189* 227*   Lipid Profile: No results for input(s): CHOL, HDL, LDLCALC, TRIG, CHOLHDL, LDLDIRECT in the last 72 hours. Thyroid Function Tests: No results for input(s): TSH, T4TOTAL, FREET4, T3FREE, THYROIDAB in the last 72 hours. Anemia Panel: No results for input(s):  VITAMINB12, FOLATE, FERRITIN, TIBC, IRON, RETICCTPCT in the last 72 hours. Sepsis Labs: Recent Labs  Lab 04/01/2019 1029  PROCALCITON 0.14  LATICACIDVEN 1.4    Recent Results (from the past 240 hour(s))  SARS Coronavirus 2 (CEPHEID- Performed in [place] hospital lab), Hosp Order     Status: None   Collection Time: 04/02/2019  3:54 AM   Specimen: Nasopharyngeal Swab  Result Value Ref Range Status   SARS Coronavirus 2 NEGATIVE NEGATIVE Final    Comment: (NOTE) If result is NEGATIVE SARS-CoV-2 target nucleic acids are NOT DETECTED. The SARS-CoV-2 RNA is generally detectable in upper and lower  respiratory specimens during the acute phase of infection. The lowest  concentration of SARS-CoV-2 viral copies this assay can detect is 250  copies / mL. A negative result does not preclude SARS-CoV-2 infection  and should not be used as the sole basis for treatment or other  patient management decisions.  A negative result may occur with  improper specimen collection / handling, submission of specimen other  than nasopharyngeal swab, presence of viral mutation(s) within the  areas targeted by this assay, and inadequate number of viral copies  (<250 copies /  mL). A negative result must be combined with clinical  observations, patient history, and epidemiological information. If result is POSITIVE SARS-CoV-2 target nucleic acids are DETECTED. The SARS-CoV-2 RNA is generally detectable in upper and lower  respiratory specimens dur ing the acute phase of infection.  Positive  results are indicative of active infection with SARS-CoV-2.  Clinical  correlation with patient history and other diagnostic information is  necessary to determine patient infection status.  Positive results do  not rule out bacterial infection or co-infection with other viruses. If result is PRESUMPTIVE POSTIVE SARS-CoV-2 nucleic acids MAY BE PRESENT.   A presumptive positive result was obtained on the submitted specimen  and confirmed on repeat testing.  While 2019 novel coronavirus  (SARS-CoV-2) nucleic acids may be present in the submitted sample  additional confirmatory testing may be necessary for epidemiological  and / or clinical management purposes  to differentiate between  SARS-CoV-2 and other Sarbecovirus currently known to infect humans.  If clinically indicated additional testing with an alternate test  methodology [phone]) is advised. The SARS-CoV-2 RNA is generally  detectable in upper and lower respiratory sp ecimens during the acute  phase of infection. The expected result is Negative. Fact Sheet for Patients:  StrictlyIdeas.no Fact Sheet for Healthcare Providers: BankingDealers.co.za This test is not yet approved or cleared by the [place] FDA and has been authorized for detection and/or diagnosis of SARS-CoV-2 by FDA under an Emergency Use Authorization (EUA).  This EUA will remain in effect (meaning this test can be used) for the duration of the COVID-19 declaration under Section 564(b)(1) of the Act, 21 U.S.C. section 360bbb-3(b)(1), unless the authorization is terminated or revoked  sooner. Performed at [facility], [place] [address] [place], [place] [address]   Blood Culture (routine x 2)     Status: None   Collection Time: 03/29/2019  4:07 AM   Specimen: BLOOD  Result Value Ref Range Status   Specimen Description BLOOD RIGHT WRIST  Final   Special Requests   Final    BOTTLES DRAWN AEROBIC AND ANAEROBIC Blood Culture results may not be optimal due to an excessive volume of blood received in culture bottles   Culture   Final    NO GROWTH 5 DAYS Performed at [facility], [place]  [address], [place], [place] [address]    Report Status 05/03/2019 FINAL  Final  Blood Culture (routine x 2)     Status: None   Collection Time: 04/11/2019  5:16 AM   Specimen: BLOOD RIGHT HAND  Result Value Ref Range Status   Specimen Description BLOOD RIGHT HAND  Final   Special Requests   Final    BOTTLES DRAWN AEROBIC AND ANAEROBIC Blood Culture adequate volume   Culture   Final    NO GROWTH 5 DAYS Performed at [facility], [place] [address], [place], [place] [address]    Report Status 2019/05/03 FINAL  Final  Urine culture     Status: Abnormal   Collection Time: 03/20/2019  6:30 AM   Specimen: In/Out Cath Urine  Result Value Ref Range Status   Specimen Description IN/OUT CATH URINE  Final   Special Requests   Final    NONE Performed at [facility], [place] [address] [place], [place] [address]    Culture 3,000 COLONIES/mL ESCHERICHIA COLI (A)  Final   Report Status 04/07/2019 FINAL  Final   Organism ID, Bacteria ESCHERICHIA COLI (A)  Final      Susceptibility   Escherichia coli - MIC*    AMPICILLIN <=2 SENSITIVE Sensitive     CEFAZOLIN <=4 SENSITIVE Sensitive     CEFTRIAXONE <=1 SENSITIVE Sensitive     CIPROFLOXACIN >=4 RESISTANT Resistant     GENTAMICIN <=1 SENSITIVE Sensitive     IMIPENEM <=0.25 SENSITIVE Sensitive     NITROFURANTOIN <=16 SENSITIVE Sensitive     TRIMETH/SULFA <=20 SENSITIVE Sensitive     AMPICILLIN/SULBACTAM <=2 SENSITIVE Sensitive      PIP/TAZO <=4 SENSITIVE Sensitive     Extended ESBL NEGATIVE Sensitive     * 3,000 COLONIES/mL ESCHERICHIA COLI  MRSA PCR Screening     Status: Abnormal   Collection Time: 03/31/2019 11:12 PM   Specimen: Nasal Mucosa; Nasopharyngeal  Result Value Ref Range Status   MRSA by PCR POSITIVE (A) NEGATIVE Final    Comment:        The GeneXpert MRSA Assay (FDA approved for NASAL specimens only), is one component of a comprehensive MRSA colonization surveillance program. It is not intended to diagnose MRSA infection nor to guide or monitor treatment for MRSA infections. RESULT CALLED TO, READ BACK BY AND VERIFIED WITH: ZARSONA,R RN 1505 04/06/2019 MITCHELL,L Performed at [facility] [address] [place], [place] [address]   Novel Coronavirus, NAA (hospital order; send-out to ref lab)     Status: None   Collection Time: 04/06/19  8:00 AM  Result Value Ref Range Status   SARS-CoV-2, NAA NOT DETECTED NOT DETECTED Final    Comment: (NOTE) This test was developed and its performance characteristics determined by [employer]. This test has not been FDA cleared or approved. This test has been authorized by FDA under an Emergency Use Authorization (EUA). This test is only authorized for the duration of time the declaration that circumstances exist justifying the authorization of the emergency use of in vitro diagnostic tests for detection of SARS-CoV-2 virus and/or diagnosis of COVID-19 infection under section 564(b)(1) of the Act, 21 U.S.C. 801KPV-3(Z)(4), unless the authorization is terminated or revoked sooner. When diagnostic testing is negative, the possibility of a false negative result should be considered in the context of a patient's recent exposures and the presence of clinical signs and symptoms consistent with COVID-19. An individual without symptoms of COVID-19 and who is not shedding  SARS-CoV-2 virus would expect to have a negative (not detected) result in this  assay. Performed  At: [facility] [address] [place], [place] [id number] Rush Farmer MD EQ:[id number]    [place]  Final    Comment: Performed at [facility], [place] [address], [place], [place] [address]  Respiratory Panel by PCR     Status: None   Collection Time: 04/06/19  8:51 AM   Specimen: Nasopharyngeal Swab; Respiratory  Result Value Ref Range Status   Adenovirus NOT DETECTED NOT DETECTED Final   Coronavirus 229E NOT DETECTED NOT DETECTED Final    Comment: (NOTE) The Coronavirus on the Respiratory Panel, DOES NOT test for the novel  Coronavirus (2019 nCoV)    Coronavirus HKU1 NOT DETECTED NOT DETECTED Final   Coronavirus NL63 NOT DETECTED NOT DETECTED Final   Coronavirus OC43 NOT DETECTED NOT DETECTED Final   Metapneumovirus NOT DETECTED NOT DETECTED Final   Rhinovirus / Enterovirus NOT DETECTED NOT DETECTED Final   Influenza A NOT DETECTED NOT DETECTED Final   Influenza B NOT DETECTED NOT DETECTED Final   Parainfluenza Virus 1 NOT DETECTED NOT DETECTED Final   Parainfluenza Virus 2 NOT DETECTED NOT DETECTED Final   Parainfluenza Virus 3 NOT DETECTED NOT DETECTED Final   Parainfluenza Virus 4 NOT DETECTED NOT DETECTED Final   Respiratory Syncytial Virus NOT DETECTED NOT DETECTED Final   Bordetella pertussis NOT DETECTED NOT DETECTED Final   Chlamydophila pneumoniae NOT DETECTED NOT DETECTED Final   Mycoplasma pneumoniae NOT DETECTED NOT DETECTED Final    Comment: Performed at [facility], [place]. [address], [place], [place] [address]  SARS Coronavirus 2 (CEPHEID- Performed in [place] hospital lab), Hosp Order     Status: None   Collection Time: 04/06/19  6:30 PM   Specimen: Nasopharyngeal Swab  Result Value Ref Range Status   SARS Coronavirus 2 NEGATIVE NEGATIVE Final    Comment: (NOTE) If result is NEGATIVE SARS-CoV-2 target nucleic acids are NOT DETECTED. The SARS-CoV-2 RNA is generally detectable in upper and  lower  respiratory specimens during the acute phase of infection. The lowest  concentration of SARS-CoV-2 viral copies this assay can detect is 250  copies / mL. A negative result does not preclude SARS-CoV-2 infection  and should not be used as the sole basis for treatment or other  patient management decisions.  A negative result may occur with  improper specimen collection / handling, submission of specimen other  than nasopharyngeal swab, presence of viral mutation(s) within the  areas targeted by this assay, and inadequate number of viral copies  (<250 copies / mL). A negative result must be combined with clinical  observations, patient history, and epidemiological information. If result is POSITIVE SARS-CoV-2 target nucleic acids are DETECTED. The SARS-CoV-2 RNA is generally detectable in upper and lower  respiratory specimens dur ing the acute phase of infection.  Positive  results are indicative of active infection with SARS-CoV-2.  Clinical  correlation with patient history and other diagnostic information is  necessary to determine patient infection status.  Positive results do  not rule out bacterial infection or co-infection with other viruses. If result is PRESUMPTIVE POSTIVE SARS-CoV-2 nucleic acids MAY BE PRESENT.   A presumptive positive result was obtained on the submitted specimen  and confirmed on repeat testing.  While 2019 novel coronavirus  (SARS-CoV-2) nucleic acids may be present in the submitted sample  additional confirmatory testing may be necessary for epidemiological  and /  or clinical management purposes  to differentiate between  SARS-CoV-2 and other Sarbecovirus currently known to infect humans.  If clinically indicated additional testing with an alternate test  methodology [phone]) is advised. The SARS-CoV-2 RNA is generally  detectable in upper and lower respiratory sp ecimens during the acute  phase of infection. The expected result is  Negative. Fact Sheet for Patients:  StrictlyIdeas.no Fact Sheet for Healthcare Providers: BankingDealers.co.za This test is not yet approved or cleared by the [place] FDA and has been authorized for detection and/or diagnosis of SARS-CoV-2 by FDA under an Emergency Use Authorization (EUA).  This EUA will remain in effect (meaning this test can be used) for the duration of the COVID-19 declaration under Section 564(b)(1) of the Act, 21 U.S.C. section 360bbb-3(b)(1), unless the authorization is terminated or revoked sooner. Performed at [facility], [place] [address], [place], [place] [address]       Radiology Studies: No results found.  Scheduled Meds:  atorvastatin  80 mg Oral q1800   baclofen  10 mg Oral TID   diltiazem  120 mg Oral Daily   divalproex  250 mg Oral q morning - 10a   enoxaparin (LOVENOX) injection  80 mg Subcutaneous Q12H   fluticasone  2 spray Each Nare q morning - 10a   guaiFENesin  600 mg Oral BID   insulin aspart  0-20 Units Subcutaneous TID WC   insulin aspart  0-5 Units Subcutaneous QHS   insulin glargine  15 Units Subcutaneous Daily   ipratropium-albuterol  3 mL Nebulization Q6H   levothyroxine  25 mcg Intravenous Daily   lidocaine  2 patch Transdermal Q24H   Melatonin  6 mg Oral QHS   metoprolol tartrate  50 mg Oral BID   oxyCODONE  10 mg Oral TID   pantoprazole  40 mg Oral Daily   QUEtiapine  25 mg Oral QHS   sertraline  200 mg Oral Daily   sodium chloride flush  3 mL Intravenous Q12H   umeclidinium bromide  1 puff Inhalation Daily   Continuous Infusions:  ceFEPime (MAXIPIME) IV 2 g (04-26-2019 0543)   potassium chloride 10 mEq (2019/04/26 1013)     LOS: 5 days   Assessment & Plan:   Principal Problem:   Sepsis due to pneumonia [facility]) Active Problems:   CAD (coronary artery disease)   Hypothyroidism   Neuropathic pain syndrome (non-herpetic)   Type II diabetes  mellitus with neurological manifestations, uncontrolled ([place])   Hyperlipidemia with target LDL less than 100   Left spastic hemiplegia (HCC)   Atrial fibrillation with RVR (HCC)   Acute on chronic respiratory failure with hypoxia (HCC)   Acute on chronic hypoxic respiratory failure secondary to acute decompensated congestive heart failure, diastolic type. She was last seen by her cardiologist in 2002.  No known history of CHF or at least none known to [place].  Echo shows 55% ejection fraction with some diastolic dysfunction.  Chest x-ray shows multifocal airspace opacities with some pulmonary edema.  Uses 4 to 6 L of oxygen at home.  Currently on BiPAP.  PCCM on board.  I talked to her daughter yesterday on 04/08/2019 and confirmed her DNR CODE STATUS.  Now patient is alert and oriented and she also confirmed DNR CODE STATUS as well.   We will start her on Lasix 40 mg IV twice daily.  Repeat chest x-ray today.  Due to grave prognosis, I am going to consult palliative care to discuss goals of care  with patient and the family.  Lethargy: She was significantly lethargic yesterday and that was likely due to polypharmacy.  She was on high-dose of gabapentin, Seroquel at night, trazodone at night and fentanyl as needed as well as antiseizure medications.  Her trazodone and gabapentin were discontinued 04/08/2019 and we reduced fentanyl frequency on 04/09/2019. Continue current dose of baclofen 10 mg 3 times daily.  She is much more alert today.  Sepsis secondary to community-acquired pneumonia vs viral pneumonia vs COVID-19 infection: She did come in with leukocytosis, tachycardia and tachypnea and met sepsis criteria and was diagnosed with community-acquired pneumonia however her procalcitonin is unremarkable which creates a question whether she even has bacterial pneumonia.  Urine antigen for Legionella and streptococci are negative.  Cultures negative.  Completed course of azithromycin.  Continue cefepime.  New  onset atrial fibrillation with RVR: Back in atrial fibrillation.  Cardiology signed off.  She is on Lopressor 50 mg twice daily and Cardizem CD 120 mg daily per cardiology recommendation.  She is on Lovenox for anticoagulation.  Type 2 diabetes mellitus with polyneuropathy: Takes high-dose long-acting insulin along with Tradjenta and glimepiride at home which have been on hold.  Blood sugar still elevated despite of 10 units of Lantus.  Will increase to 15 units.  Continue SSI.  Essential hypertension: Controlled.  Continue medications per cardiology.  CAD: Status post stents in the past.  No ACS symptoms. Continue statin and aspirin.  History of CVA: Known left spastic hemiplegia.  Hypothyroidism: Continue Synthroid  Hyperlipidemia: Continue atorvastatin  Chronic mild depression: Continue Zoloft  DVT prophylaxis: Lovenox Code Status: DNR.  Confirmed with patient on 04/09/2019 and with daughter on 04/08/2019. Family Communication: Discussed in length with daughter on 04/08/2019 about her CODE STATUS and also updated her about her current situation and grave prognosis. Disposition Plan: To be determined   Total time spent 30 minutes  Darliss Cheney, MD [facility] Pager [phone]  If 7PM-7AM, please contact night-coverage [email or web address] Password [email or web address] April 17, 2019, 10:17 AM

## 2019-04-12 NOTE — Progress Notes (Signed)
Family left at this time. Daughter given info for Pt Placement so the daughter can contact the hospital where the body is to be taken for funeral if [facility]'t take [facility] for [facility].

## 2019-04-12 NOTE — Death Summary Note (Signed)
Death Summary  Susan [name] ZOX:[id number] DOB: 05-16-1951 DOA: 04-06-2019  PCP: Lattie Corns, PA-C PCP/Office notified:   Admit date: 04/06/19 Date of Death: 04-12-2019  Final Diagnoses:  Principal Problem:   Sepsis due to pneumonia [facility]) Active Problems:   CAD (coronary artery disease)   Hypothyroidism   Neuropathic pain syndrome (non-herpetic)   Type II diabetes mellitus with neurological manifestations, uncontrolled ([place])   Hyperlipidemia with target LDL less than 100   Left spastic hemiplegia (HCC)   Atrial fibrillation with RVR ([place])   Acute on chronic respiratory failure with hypoxia ([place])   Advanced care planning/counseling discussion   Goals of care, counseling/discussion   Palliative care by specialist   Terminal care    Susan [name] is a 68 y.o. female with medical history significant of HTN, HLD, CAD s/p stents, COPD on 4 to 6 L, CVA with residual weakness, hypothyroidism, and depression; who presents with complaints of shortness of breath over the last week.  She denies any previous history of having an irregular heartbeat and initially states that she wanted to go home.  Associated symptoms of productive cough, subjective fevers, palpitations, and generalized malaise.  Denies having any active chest pain, nausea, vomiting, diaphoresis, or leg pain.  Records show patient had recently been started on Levaquin earlier this week.  EMS found the patient with O2 saturations into the 80s which she was placed on a nonrebreather.  ED Course: On admission to the emergency department patient was noted to be afebrile, pulse up to 150s in atrial fibrillation with RVR, blood pressures 101/58-134/83, and O2 saturations as low as 84% with improvement to upper 80s to low 90s on 15 L flow oxygen.  Labs revealed WBC 13.7 and hemoglobin 10.7.  Chest x-ray showing cardiomegaly with shallow lung inflation and small right pleural effusion.  Patient was started on Cardizem drip.  TRH called to  admit.  History of present illness:    Hospital Course: Admitted to hospitalist service and cardiology consulted.  Patient was started on treatment for community-acquired pneumonia.  She had elevated d-dimer so CT angiogram of the chest was done and she was ruled out of PE.  This showed groundglass opacities bilaterally with diffuse bilateral infiltrates.  Procalcitonin is unremarkable however all other inflammatory markers including d-dimer, ferritin, LDH, ESR and CRP were elevated raising suspicion for COVID-19 so she was tested twice with a rapid test and once with send out test and all 3 of them are negative.  On the morning of 04/08/2019, she became more lethargic.  I was called at the bedside to assess her urgently.  She was on nonrebreather 100%.  She was hard to arouse with a sternal rub however seem to be protecting her airway.  Stat ABG was done which showed 57 PCO2 and hypoxia.  Slightly better than day before.  PCCM was called urgently on 04/08/2019 for worsening respiratory status.  She was started on BiPAP.  She was found to be on several sedative medications including high-dose of twice daily gabapentin, Seroquel, trazodone, as needed fentanyl, baclofen 10 mg 3 times daily.  Her gabapentin and trazodone were discontinued.  Fentanyl was spaced out.  Could not discontinue baclofen all of a sudden due to withdrawal.  She improved with those measures initially but then continued to require BiPAP almost all the time.  PCCM continue to follow her. While she was in respiratory distress on 04/08/2019, I also called patient's daughter [name] and confirmed her DNR status.  When patient was  more awake on 04/09/2019, I also confirmed DNR status with the patient herself.  Since the chances of her recovery were very poor so palliative care was consulted and subsequently patient was transitioned to comfort care and eventually she passed away at 9:28 PM on 2019/04/15  Time: 12 minutes  Signed:  Darliss Cheney  [facility] 04/11/2019, 2:52 PM

## 2019-04-12 NOTE — Progress Notes (Signed)
Pt given 2mg  IV Bolus of Morphine per family request. Pt currently having 10 second apnea with moaning expirations. AHR has decreased greatly from initial assessment

## 2019-04-12 NOTE — Progress Notes (Signed)
Chaplain requested for family support. Pt goes by "Susan [name]" even though it's  "[name]" in the charts.  [name]'s only sister, [name]'s daughter and her husband were present.  Chaplain offered prayer and ministry of support before patient was extubated. Family is at peace with their decision but naturally actively grieving. Chaplain learned that pt's husband had filed for divorce a month ago, and that event "led her to give up" her sister says. Chaplain will continue to be available through the night. Rev. Tamsen Snider Pager [phone]

## 2019-04-12 NOTE — Progress Notes (Signed)
Contacted pt's sister Juliann Pulse at [phone] in regards to get pt's (ex?)husband's phone number because the number on file [phone] isn't in service. Juliann Pulse stated she talked to him earlier tonight and he is agreeable for pt to go to [facility] for body donation to [facility]. Kathy's understanding is the couple is divorced. Explained to Juliann Pulse what we need him to do so that [name] can make arrangements and if he doesn't do that then he will have to make arrangements. Juliann Pulse stated she is to call the man in the morning and she will give him Pt Placement number for this hospital in which [name] has the number [phone] option 1). Juliann Pulse voiced understanding and said she would contact Beth tonight and get the number and let her know what was going on.

## 2019-04-12 NOTE — Progress Notes (Signed)
Wasted 23ml of Morphine IV solution with Estill Bamberg RN CN.

## 2019-04-12 NOTE — Progress Notes (Signed)
BiPaP taken off per family's request. Pt remains responsive to verbal stimuli. Discussed with daughter POA about pt's wishes to [employer] to [employer] and daughter confirmed this as pt's wishes. Discussed with CN Estill Bamberg RN who contacted Chaplain but they didn't know how to go about it. Amanda RN CN to investigate who to contact about pt's wishes.

## 2019-04-12 NOTE — Progress Notes (Signed)
ANTICOAGULATION CONSULT NOTE - Follow Up Consult  Pharmacy Consult for Lovenox Indication: atrial fibrillation  Allergies  Allergen Reactions  . Onion Shortness Of Breath and Swelling  . Pollen Extract Itching  . Morphine And Related Hives    PT DENIES ALLERGY  . Niaspan [Niacin Er] Other (See Comments)    Unknown reaction  . Trazodone And Nefazodone Other (See Comments)    Hallucinations (currently being given at [facility] 03/15/2019)  . Valium [Diazepam] Other (See Comments)    Unknown reaction  . Wellbutrin [Bupropion] Other (See Comments)    Unknown reaction    Patient Measurements: Height: 5\' 3"  (160 cm) Weight: 173 lb 11.6 oz (78.8 kg) IBW/kg (Calculated) : 52.4  Vital Signs: Temp: 99 F (37.2 C) (07/30 0341) Temp Source: Axillary (07/30 0341) BP: 151/58 (07/30 0708) Pulse Rate: 101 (07/30 0837)  Labs: Recent Labs    04/07/19 1153 04/07/19 1353 04/07/19 1530  04/08/19 0627 04/09/19 0444 May 08, 2019 0635  HGB  --   --   --    < > 12.5 11.1* 10.3*  HCT  --   --   --   --  43.7 37.6 35.8*  PLT  --   --   --   --  324 268 284  CREATININE  --   --   --   --  0.71 0.99 0.79  TROPONINIHS 11 14 15   --   --   --   --    < > = values in this interval not displayed.    Estimated Creatinine Clearance: 66.9 mL/min (by C-G formula based on SCr of 0.79 mg/dL).  Assessment:  Anticoag: Full dose Enox for new afib. Hgb 10.3 (previous 10.7>12.5.>10.3). Plts 284 WNL and stable. Scr WNL. CHA2DS2VASc =8 - Cards note: If the plan is for aggressive management, she will need long term anti-coagulation..   Goal of Therapy:  Anti-Xa level 0.6-1 units/ml 4hrs after LMWH dose given Monitor platelets by anticoagulation protocol: Yes   Plan:  - Cont Enox to 80 mg SQ q 12h  - CBC Q72H   Athleen Feltner S. Alford Highland, PharmD, BCPS Clinical Staff Pharmacist Eilene Ghazi Stillinger 2019-05-08,10:21 AM

## 2019-04-12 NOTE — Progress Notes (Signed)
Responded to unit page to support patient's sister at bedside. Patient is actively passing. Sister is waiting to hear from patients daughter before making decision to withdraw.  Provided ministry of presence,praye as well as emotional and spiritual support.  Chaplain will follow as needed.   Jaclynn Major, [place], [facility], Pager [phone]

## 2019-04-12 NOTE — Consult Note (Signed)
Consultation Note Date: 04/26/2019   Patient Name: Susan [name]  DOB: [birth year]  MRN: [id number]  Age / Sex: 68 y.o., female  PCP: Susan Corns, PA-C Referring Physician: Darliss Cheney, MD  Reason for Consultation: Terminal Care  HPI/Patient Profile: 68 y.o. female  with past medical history of CVA with left hemiplegia, COPD with chronic respiratory failure on 4-6L at home, hypothyroidism, CAD s/p stenting   admitted on 03/20/2019 with worsening SOB, cough, fevers, malaise. COVID ruled out with multiple testing.  Workup revealed likely community required pnuemonia. On 7/28 she became lethargic with worsening respiratory status. At that time primary team spoke with patient and patient's daughter confirming DNR/DNI status. Today PMT called to pt bedside urgently as she is found with O2 saturations in the 60's despite Bipap in place to assist with comfort.   Clinical Assessment and Goals of Care: Patient in bed on bipap. Hands are blue. She is agitated, pulling at mask. She tells me she wants the mask off. I asked her if she is saying she would prefer full comfort measures including medications that would make her sleepy and removing the mask which would mean that she would likely go towards EOL. She said, "please".   I attempted multiple times to reach spouse Susan [name]- although I later found out they are legally separated and he has served patient with divorce papers)  I also called patient's daughter with no answer. (She is out of cell phone range)  I was able to reach patient's sister, Susan [name]. Susan [name] had a good understanding of the situation and stated patient's daughter was aware of Susan [name]'s poor prognosis. Susan [name] requested that patient be provided with comfort measures however, Bipap be maintained until she could arrive and until patient's daughter could be contacted.   I spoke with Susan [name] at bedside- she  verbalized that she understood that Susan [name] would not wish to prolong her life in her current state, and she definitely would not want to be intubated. Susan [name] is communicating with patient's daughterEustaquio [name] via text regarding the situation- Susan [name] is two hours away and out of service. However she is supposed to be checking her phone every hour or two.  Susan [name] was able to reach [place] [name] this evening and decision made to continue comfort and d/c bipap when Susan [name] arrives.   Primary Decision Maker NEXT OF KIN- patient's daughter and sister    SUMMARY OF RECOMMENDATIONS -Continue bipap, however, start comfort measures, plan to d/c bipap when patient's daughter arrives -Morphine infusion 2mg /hr with 2mg  bolus q15 min prn for comfort before d/c bipap -Lorazepam 2mg  q4hr prn agitation/anxiety -D/C oral medications -D/C labs   Code Status/Advance Care Planning:  DNR   Prognosis:    Hours - Days  Discharge Planning: Anticipated [name] Death  Primary Diagnoses: Present on Admission: . Atrial fibrillation with RVR (Susan [name]) . Hypothyroidism . Neuropathic pain syndrome (non-herpetic) . Acute on chronic respiratory failure with hypoxia (Susan [name]) . CAD (coronary artery disease) . Type II diabetes mellitus with neurological manifestations, uncontrolled (Susan [name]) . Hyperlipidemia with  target LDL less than 100 . Left spastic hemiplegia (Susan [name]) . Sepsis due to pneumonia [name])   I have reviewed the medical record, interviewed the patient and family, and examined the patient. The following aspects are pertinent.  Past Medical History:  Diagnosis Date  . Anemia   . CAD (coronary artery disease)   . Chronic pain   . COPD (chronic obstructive pulmonary disease) (Susan [name])   . Depression   . Diabetes mellitus without complication (Susan [name])   . Fall   . Hemiplegia affecting non-dominant side, post-stroke   . Hyperlipidemia   . Hypertension   . Stroke ([place]) 12/2013   left side paralysis  . Thyroid disease   . Urine  retention    Social History   Socioeconomic History  . Marital status: Married    Spouse name: Not on file  . Number of children: Not on file  . Years of education: Not on file  . Highest education level: Not on file  Occupational History  . Not on file  Social Needs  . Financial resource strain: Not on file  . Food insecurity    Worry: Not on file    Inability: Not on file  . Transportation needs    Medical: Not on file    Non-medical: Not on file  Tobacco Use  . Smoking status: Former Smoker    Packs/day: 4.00    Years: 40.00    Pack years: 160.00    Types: Cigarettes  . Smokeless tobacco: Never Used  Substance and Sexual Activity  . Alcohol use: No  . Drug use: No  . Sexual activity: Not Currently  Lifestyle  . Physical activity    Days per week: Not on file    Minutes per session: Not on file  . Stress: Not on file  Relationships  . Social Herbalist on phone: Not on file    Gets together: Not on file    Attends religious service: Not on file    Active member of club or organization: Not on file    Attends meetings of clubs or organizations: Not on file    Relationship status: Not on file  Other Topics Concern  . Not on file  Social History Narrative  . Not on file   Family History  Problem Relation Age of Onset  . Diabetes Mother    Scheduled Meds: . diltiazem  120 mg Oral Daily  . fluticasone  2 spray Each Nare q morning - 10a  . furosemide  40 mg Intravenous BID  . guaiFENesin  600 mg Oral BID  . insulin aspart  0-20 Units Subcutaneous TID WC  . insulin aspart  0-5 Units Subcutaneous QHS  . insulin glargine  15 Units Subcutaneous Daily  . ipratropium-albuterol  3 mL Nebulization Q6H  . levothyroxine  25 mcg Intravenous Daily  . lidocaine  2 patch Transdermal Q24H  . Melatonin  6 mg Oral QHS  . metoprolol tartrate  50 mg Oral BID  . oxyCODONE  10 mg Oral TID  . pantoprazole  40 mg Oral Daily  . QUEtiapine  25 mg Oral QHS  .  sertraline  200 mg Oral Daily  . sodium chloride flush  3 mL Intravenous Q12H  . umeclidinium bromide  1 puff Inhalation Daily   Continuous Infusions: . ceFEPime (MAXIPIME) IV 2 g (2019-04-26 0543)  . morphine     PRN Meds:.acetaminophen **OR** acetaminophen, diphenhydrAMINE, levalbuterol, LORazepam, morphine injection, morphine, naLOXone (NARCAN)  injection,  ondansetron **OR** ondansetron (ZOFRAN) IV, promethazine Medications Prior to Admission:  Prior to Admission medications   Medication Sig Start Date End Date Taking? Authorizing Provider  acetaminophen (TYLENOL) 325 MG tablet Take 650 mg by mouth every 4 (four) hours as needed (pain).    Yes [provider]  acetaminophen (TYLENOL) 500 MG tablet Take 1,000 mg by mouth 3 (three) times daily. 10am, 5pm, 9pm   Yes [provider]  aspirin 81 MG chewable tablet Chew 81 mg by mouth daily.    Yes [provider]  atorvastatin (LIPITOR) 80 MG tablet Take 80 mg by mouth every evening.    Yes [provider]  baclofen (LIORESAL) 10 MG tablet Take 10 mg by mouth 3 (three) times daily. For muscle spasms 03/27/14  Yes Gerlene Fee, NP  cholecalciferol (VITAMIN D3) 25 MCG (1000 UT) tablet Take 1,000 Units by mouth every evening.   Yes [provider]  divalproex (DEPAKOTE) 250 MG DR tablet Take 250 mg by mouth every morning.   Yes [provider]  docusate sodium (COLACE) 100 MG capsule Take 2 capsules (200 mg total) by mouth 2 (two) times daily. Patient taking differently: Take 100 mg by mouth every morning.  07/01/14  Yes Gerlene Fee, NP  ferrous sulfate 325 (65 FE) MG tablet Take 325 mg by mouth 2 (two) times a day.   Yes [provider]  fluticasone (FLONASE) 50 MCG/ACT nasal spray Place 2 sprays into both nostrils every morning. For chronic sinus infections   Yes [provider]  gabapentin (NEURONTIN) 400 MG capsule Take 400-1,200 mg by mouth See admin instructions.  Take one capsule (400 mg) by mouth twice daily - 10am and 5pm; take three capsules (1200 mg) at bedtime - 9pm   Yes [provider]  glimepiride (AMARYL) 1 MG tablet Take 1 mg by mouth daily with breakfast.    Yes [provider]  guaiFENesin (MUCINEX) 600 MG 12 hr tablet Take 600 mg by mouth every 12 (twelve) hours.   Yes [provider]  Insulin Glargine (TOUJEO SOLOSTAR) 300 UNIT/ML SOPN Inject 54-76 Units into the skin See admin instructions. Inject 76 units subcutaneously every morning (9am) and inject 54 units in the evening (6pm)   Yes [provider]  ipratropium-albuterol (DUONEB) 0.5-2.5 (3) MG/3ML SOLN Take 3 mLs by nebulization See admin instructions. Inhale one vial via nebulizer every 8 hours, may also use every 6 hours as needed for shortness of breath/wheezing   Yes [provider]  isosorbide mononitrate (IMDUR) 30 MG 24 hr tablet Take 30 mg by mouth every evening.    Yes [provider]  levofloxacin (LEVAQUIN) 750 MG tablet Take 750 mg by mouth daily.   Yes [provider]  levothyroxine (SYNTHROID) 50 MCG tablet Take 50 mcg by mouth daily at 6 (six) AM.    Yes [provider]  Lidocaine 4 % PTCH Apply 2 patches topically See admin instructions. Apply patch to most painful region every morning, remove 12 hours later. Apply another patch to right elbow every morning, remove after 12 hours   Yes [provider]  linagliptin (TRADJENTA) 5 MG TABS tablet Take 5 mg by mouth every morning.    Yes [provider]  magnesium oxide (MAG-OX) 400 MG tablet Take 400 mg by mouth 2 (two) times daily.   Yes [provider]  Melatonin 3 MG TABS Take 6 mg by mouth at bedtime.    Yes [provider]  metFORMIN (GLUCOPHAGE) 1000 MG tablet Take 1,000 mg by mouth 2 (two) times daily with a meal.    Yes [provider]  nitroGLYCERIN (NITROSTAT) 0.4 MG SL tablet Place 0.4 mg under the tongue  every 5 (five) minutes as needed for chest pain. Notify MD if not relieved by NTG   Yes [provider]  omeprazole (PRILOSEC) 20 MG capsule Take 20 mg by mouth daily at 6 (six) AM.    Yes [provider]  Oxycodone HCl 10 MG TABS Take 10 mg by mouth 3 (three) times daily. 6am, 2pm, 9pm   Yes [provider]  OXYGEN Inhale 2 L into the lungs as needed (for O2 sats 88% or lower).    Yes [provider]  QUEtiapine (SEROQUEL) 25 MG tablet Take 25 mg by mouth at bedtime.   Yes [provider]  sertraline (ZOLOFT) 100 MG tablet Take 200 mg by mouth every morning.    Yes [provider]  simethicone (MYLICON) 80 MG chewable tablet Chew 80 mg by mouth 3 (three) times daily.   Yes [provider]  spironolactone (ALDACTONE) 25 MG tablet Take 25 mg by mouth 2 (two) times daily. For edema   Yes [provider]  tiotropium (SPIRIVA) 18 MCG inhalation capsule Place 18 mcg into inhaler and inhale every morning.    Yes [provider]  traZODone (DESYREL) 50 MG tablet Take 125 mg by mouth at bedtime.   Yes [provider]  triamcinolone cream (KENALOG) 0.1 % Apply 1 application topically See admin instructions. Apply thin layer of cream to affected areas on lower face/jawline and lower neck/chest area - twice daily   Yes [provider]  vitamin B-12 (CYANOCOBALAMIN) 1000 MCG tablet Take 1,000 mcg by mouth daily with breakfast.   Yes [provider]  vitamin C (ASCORBIC ACID) 500 MG tablet Take 500 mg by mouth 2 (two) times daily.   Yes [provider]  methylPREDNISolone sodium succinate (SOLU-MEDROL) 125 mg/2 mL injection Inject 125 mg into the muscle once.    [provider]   Allergies  Allergen Reactions  . Onion Shortness Of Breath and Swelling  . Pollen Extract Itching  . Morphine And Related Hives    PT DENIES ALLERGY  . Niaspan [Niacin Er] Other (See Comments)    Unknown  reaction  . Trazodone And Nefazodone Other (See Comments)    Hallucinations (currently being given at [facility] 04/09/2019)  . Valium [Diazepam] Other (See Comments)    Unknown reaction  . Wellbutrin [Bupropion] Other (See Comments)    Unknown reaction   Review of Systems  Physical Exam  Vital Signs: BP (!) 129/49 (BP Location: Left Arm)   Pulse 85   Temp 99 F (37.2 C) (Axillary)   Resp 15   Ht 5\' 3"  (1.6 m)   Wt 78.8 kg   SpO2 (!) 63% Comment: MD, RN aware. Pallative Care is consulted  BMI 30.77 kg/m  Pain Scale: 0-10   Pain Score: 7    SpO2: SpO2: (!) 63 %(MD, RN aware. Pallative Care is consulted) O2 Device:SpO2: (!) 63 %(MD, RN aware. Pallative Care is consulted) O2 Flow Rate: .O2 Flow Rate (L/min): 100 L/min  IO: Intake/output summary:   Intake/Output Summary (Last 24 hours) at 05/04/19 1705 Last data filed at 05-04-2019 0317 Gross per 24 hour  Intake 590.01 ml  Output -  Net 590.01 ml    LBM: Last BM Date: 04/07/19 Baseline Weight: Weight:  64.9 kg Most recent weight: Weight: 78.8 kg     Palliative Assessment/Data: PPS: 10%     Thank you for this consult. Palliative medicine will continue to follow and assist as needed.   Time In:1300, 1700  Time Out: 1400,1800 Time Total: 120 minutes Prolonged services billed: yes Greater than 50%  of this time was spent counseling and coordinating care related to the above assessment and plan.  Signed by: [name], AGNP-C Palliative Medicine    Please contact Palliative Medicine Team phone at [phone] for questions and concerns.  For individual provider: See Shea Evans

## 2019-04-12 DEATH — deceased

## 2020-09-30 IMAGING — CT CT ANGIOGRAPHY CHEST
2 of 5 series · 18 of 46 positions shown · IV contrast (omnipaque)
Comparison: None.

CLINICAL DATA: PE suspected, LCSEQ-KA investigation

EXAM:
CT ANGIOGRAPHY CHEST WITH CONTRAST
TECHNIQUE: Multidetector CT imaging of the chest was performed using the
standard protocol during bolus administration of intravenous
contrast. Multiplanar CT image reconstructions and MIPs were
obtained to evaluate the vascular anatomy.
CONTRAST:  100mL OMNIPAQUE IOHEXOL 350 MG/ML SOLN

[Series 5: pe 3.0 i30f 1 · axial · 0.86mm/px · z∈[+1197,+1449]mm · 15 of 94 slices shown]
[im 7/94  lung]
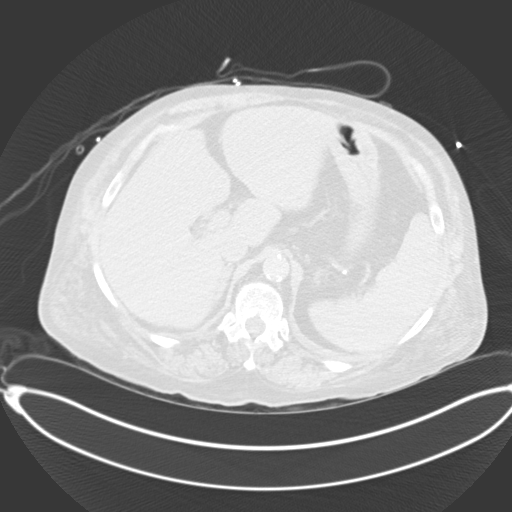
[im 13/94  soft-tissue]
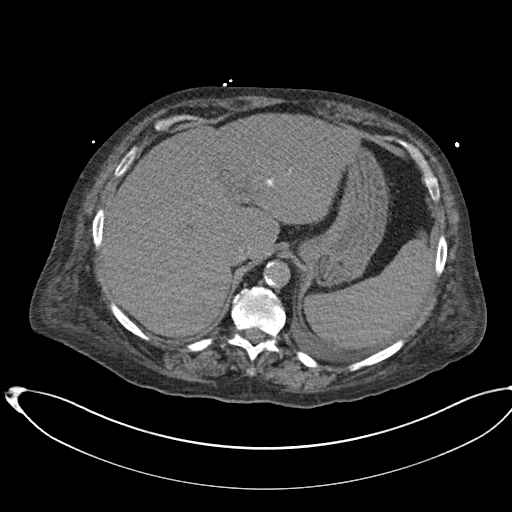
[im 19/94  lung]
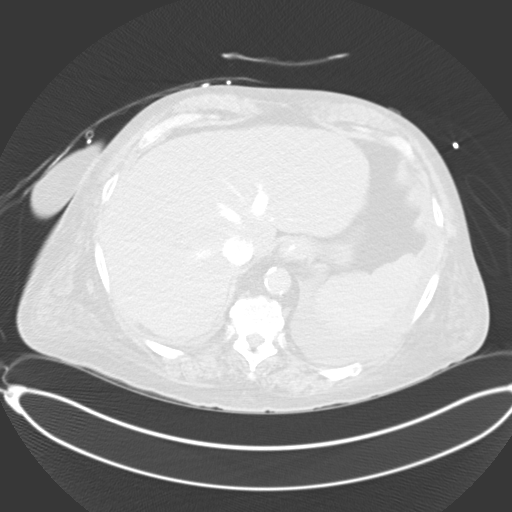
[im 25/94  soft-tissue]
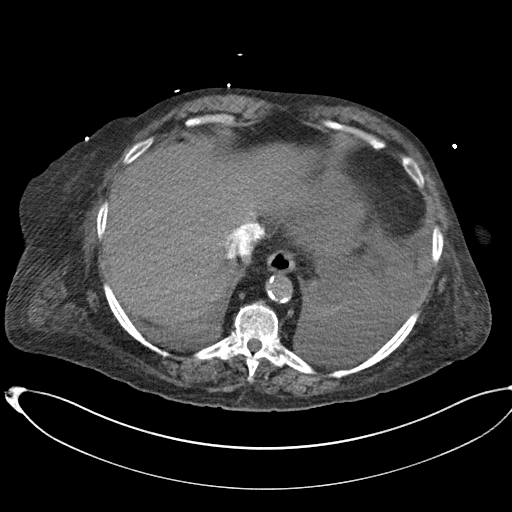
[im 31/94  lung]
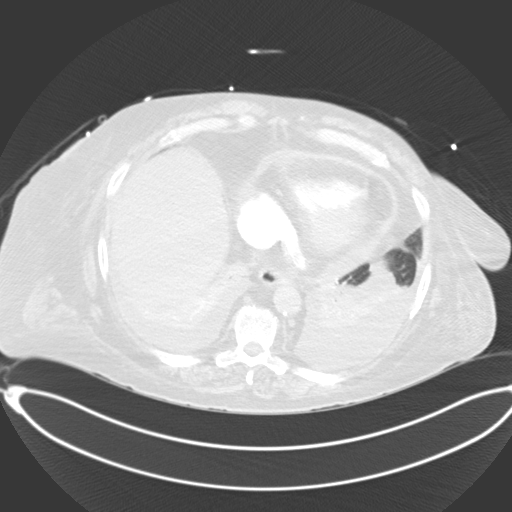
[im 37/94  soft-tissue]
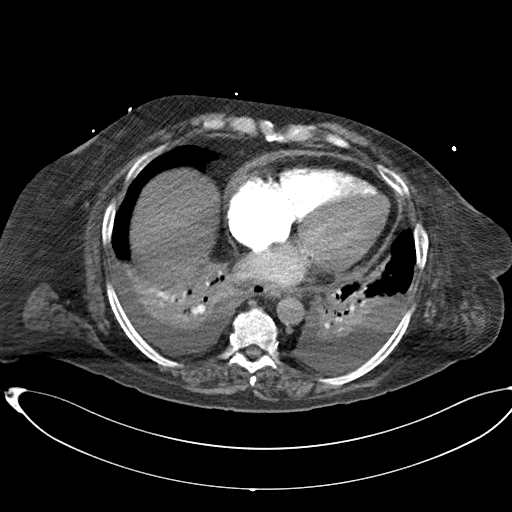
[im 43/94  lung]
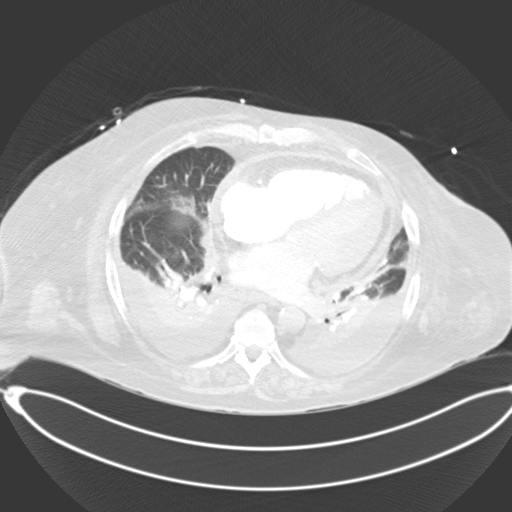
[im 49/94  soft-tissue]
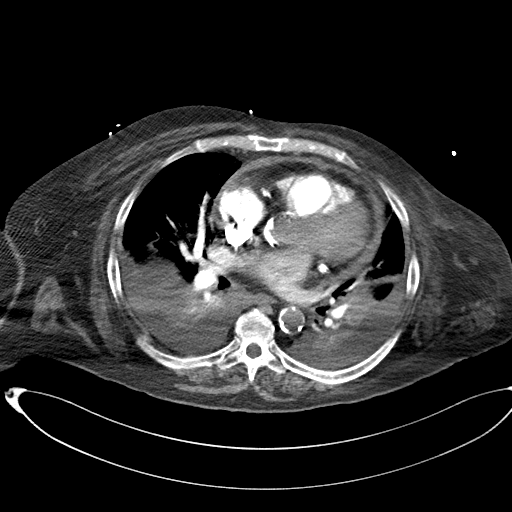
[im 55/94  lung]
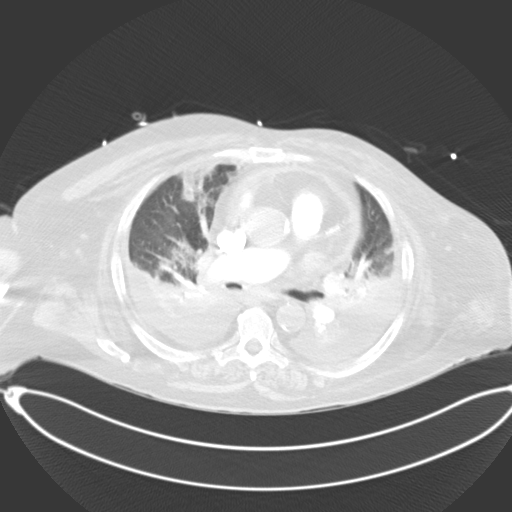
[im 61/94  soft-tissue]
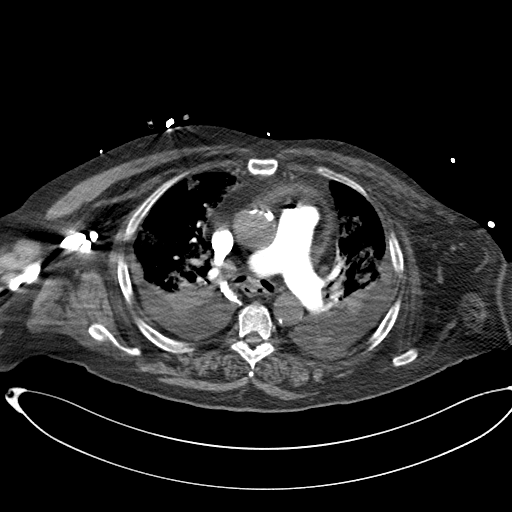
[im 67/94  lung]
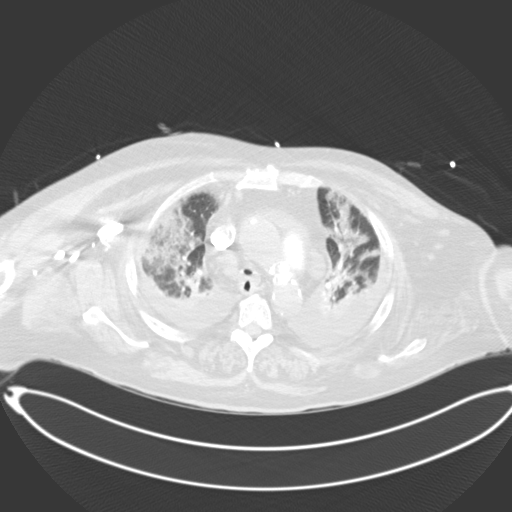
[im 73/94  soft-tissue]
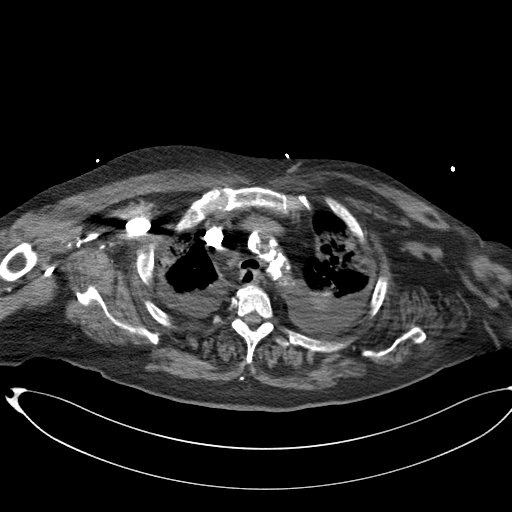
[im 79/94  lung]
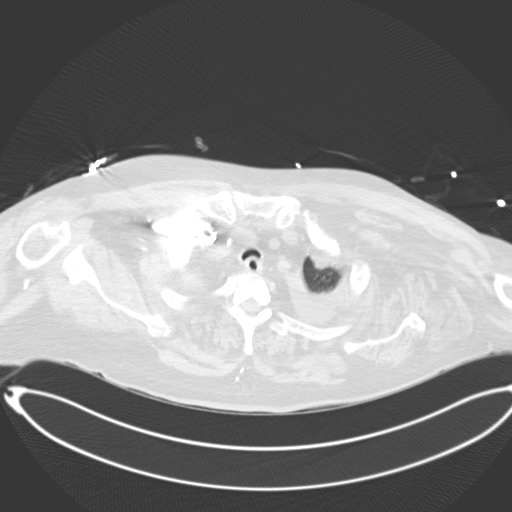
[im 85/94  soft-tissue]
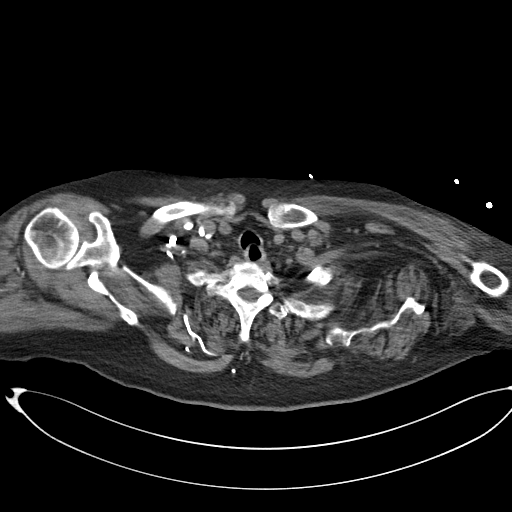
[im 91/94  lung]
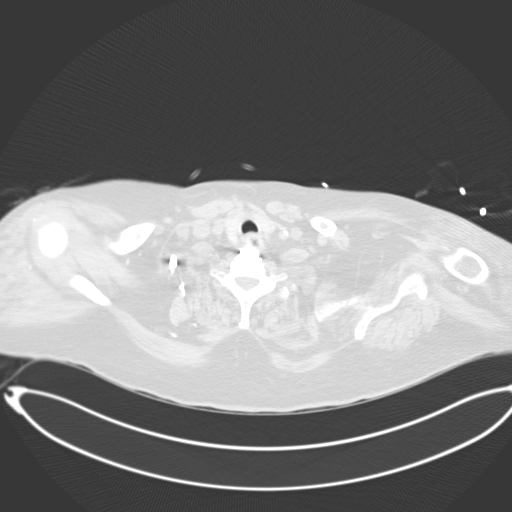

[Series 8: coronal mpr · coronal · 0.58mm/px · 3 of 124 slices shown]
[im 31/124  soft-tissue]
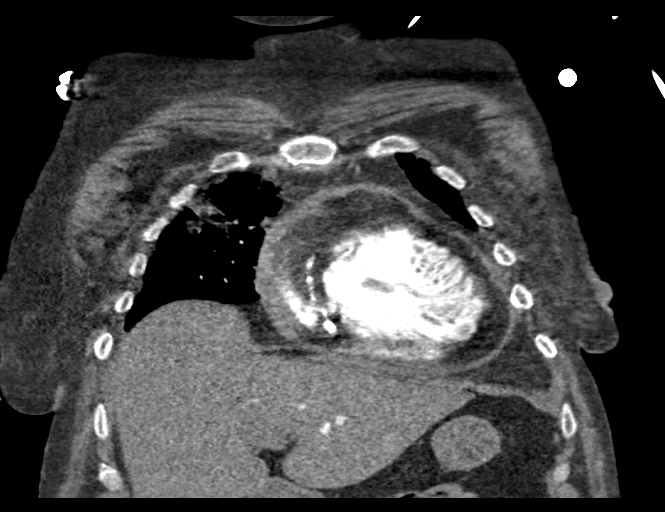
[im 62/124  soft-tissue]
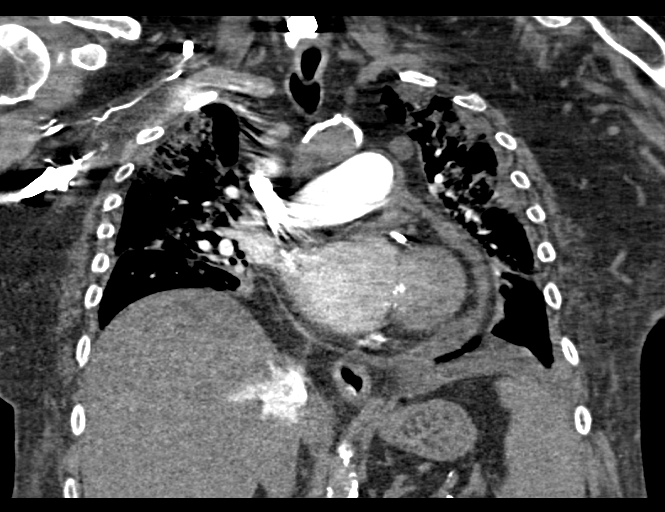
[im 93/124  soft-tissue]
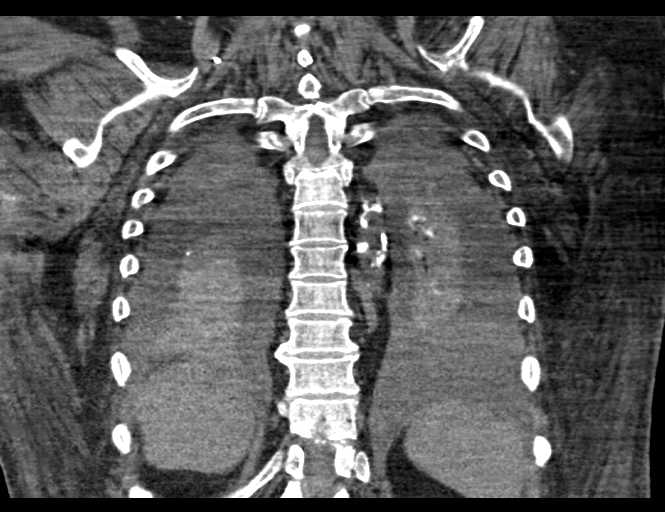

[18 of 46 positions shown; findings below may reference images not displayed]

FINDINGS: Cardiovascular: Satisfactory opacification of the pulmonary arteries
to the segmental level. No evidence of pulmonary embolism.
Cardiomegaly. Three-vessel coronary artery calcifications and/or
stents. Small pericardial effusion. Aortic valve calcifications.
Aortic atherosclerosis.

Mediastinum/Nodes: No enlarged mediastinal, hilar, or axillary lymph
nodes. Thyroid gland, trachea, and esophagus demonstrate no
significant findings.

Lungs/Pleura: Extensive bilateral ground-glass and consolidative
opacity, somewhat geographic in appearance. Moderate bilateral
pleural effusions with associated atelectasis or consolidation of
the dependent lower lungs.

Upper Abdomen: No acute abnormality.

Musculoskeletal: No chest wall abnormality. No acute or significant
osseous findings.

Review of the MIP images confirms the above findings.
IMPRESSION: 1.  Negative examination for pulmonary embolism.

2. Extensive bilateral ground-glass and consolidative opacity,
somewhat geographic in appearance and consistent with multifocal
infection, potentially including LCSEQ-KA.

3. Moderate bilateral pleural effusions with associated atelectasis
or consolidation of the dependent lower lungs.

4. Cardiomegaly and coronary artery disease. Small pericardial
effusion.

5.  Aortic atherosclerosis and aortic valve calcifications.
